# Patient Record
Sex: Female | Born: 1935 | Race: White | Hispanic: No | Smoking: Never smoker
Health system: Southern US, Community
[De-identification: ages and names within clinical notes are randomized; demographics above are authoritative.]

## PROBLEM LIST (undated history)

## (undated) DIAGNOSIS — K469 Unspecified abdominal hernia without obstruction or gangrene: Secondary | ICD-10-CM

## (undated) DIAGNOSIS — M1990 Unspecified osteoarthritis, unspecified site: Secondary | ICD-10-CM

## (undated) DIAGNOSIS — I10 Essential (primary) hypertension: Secondary | ICD-10-CM

## (undated) DIAGNOSIS — E669 Obesity, unspecified: Secondary | ICD-10-CM

## (undated) DIAGNOSIS — R42 Dizziness and giddiness: Secondary | ICD-10-CM

## (undated) HISTORY — PX: HERNIA REPAIR: SHX51

## (undated) HISTORY — PX: CHOLECYSTECTOMY: SHX55

## (undated) HISTORY — PX: TONSILLECTOMY: SUR1361

## (undated) HISTORY — DX: Obesity, unspecified: E66.9

## (undated) HISTORY — PX: OTHER SURGICAL HISTORY: SHX169

## (undated) HISTORY — DX: Dizziness and giddiness: R42

## (undated) HISTORY — PX: BREAST SURGERY: SHX581

---

## 2004-06-27 ENCOUNTER — Ambulatory Visit: Payer: Self-pay | Admitting: Cardiology

## 2005-01-22 ENCOUNTER — Encounter: Admission: RE | Admit: 2005-01-22 | Discharge: 2005-04-22 | Payer: Self-pay | Admitting: Family Medicine

## 2005-02-07 ENCOUNTER — Ambulatory Visit: Payer: Self-pay | Admitting: Cardiology

## 2005-02-08 ENCOUNTER — Ambulatory Visit: Payer: Self-pay | Admitting: Cardiology

## 2006-03-26 ENCOUNTER — Ambulatory Visit (HOSPITAL_COMMUNITY): Admission: RE | Admit: 2006-03-26 | Discharge: 2006-03-26 | Payer: Self-pay | Admitting: Endocrinology

## 2006-10-01 ENCOUNTER — Encounter: Admission: RE | Admit: 2006-10-01 | Discharge: 2006-10-01 | Payer: Self-pay | Admitting: Endocrinology

## 2006-10-01 ENCOUNTER — Encounter: Admission: RE | Admit: 2006-10-01 | Discharge: 2006-10-01 | Payer: Self-pay | Admitting: Family Medicine

## 2006-10-07 ENCOUNTER — Encounter (INDEPENDENT_AMBULATORY_CARE_PROVIDER_SITE_OTHER): Payer: Self-pay | Admitting: Diagnostic Radiology

## 2006-10-07 ENCOUNTER — Encounter: Admission: RE | Admit: 2006-10-07 | Discharge: 2006-10-07 | Payer: Self-pay | Admitting: Endocrinology

## 2006-10-07 ENCOUNTER — Other Ambulatory Visit: Admission: RE | Admit: 2006-10-07 | Discharge: 2006-10-07 | Payer: Self-pay | Admitting: Diagnostic Radiology

## 2007-06-09 ENCOUNTER — Encounter: Admission: RE | Admit: 2007-06-09 | Discharge: 2007-06-09 | Payer: Self-pay | Admitting: Endocrinology

## 2010-05-13 ENCOUNTER — Encounter: Payer: Self-pay | Admitting: Endocrinology

## 2011-06-08 DIAGNOSIS — R6889 Other general symptoms and signs: Secondary | ICD-10-CM | POA: Diagnosis not present

## 2011-06-09 DIAGNOSIS — I119 Hypertensive heart disease without heart failure: Secondary | ICD-10-CM | POA: Diagnosis not present

## 2011-06-10 DIAGNOSIS — E785 Hyperlipidemia, unspecified: Secondary | ICD-10-CM | POA: Diagnosis not present

## 2011-06-10 DIAGNOSIS — I10 Essential (primary) hypertension: Secondary | ICD-10-CM | POA: Diagnosis not present

## 2011-06-10 DIAGNOSIS — R5381 Other malaise: Secondary | ICD-10-CM | POA: Diagnosis not present

## 2011-10-28 ENCOUNTER — Ambulatory Visit (HOSPITAL_COMMUNITY)
Admission: RE | Admit: 2011-10-28 | Discharge: 2011-10-28 | Disposition: A | Discharge status: Home / Self Care | Payer: Medicare Other | Source: Ambulatory Visit | Attending: Family Medicine | Admitting: Family Medicine

## 2011-10-28 ENCOUNTER — Emergency Department (HOSPITAL_COMMUNITY)
Admission: EM | Admit: 2011-10-28 | Discharge: 2011-10-28 | Disposition: A | Discharge status: Home / Self Care | Payer: Medicare Other | Attending: Emergency Medicine | Admitting: Emergency Medicine

## 2011-10-28 ENCOUNTER — Other Ambulatory Visit: Payer: Self-pay | Admitting: Family Medicine

## 2011-10-28 ENCOUNTER — Encounter (HOSPITAL_COMMUNITY): Payer: Self-pay | Admitting: *Deleted

## 2011-10-28 DIAGNOSIS — Z9089 Acquired absence of other organs: Secondary | ICD-10-CM | POA: Insufficient documentation

## 2011-10-28 DIAGNOSIS — M129 Arthropathy, unspecified: Secondary | ICD-10-CM | POA: Insufficient documentation

## 2011-10-28 DIAGNOSIS — M79605 Pain in left leg: Secondary | ICD-10-CM

## 2011-10-28 DIAGNOSIS — I10 Essential (primary) hypertension: Secondary | ICD-10-CM | POA: Insufficient documentation

## 2011-10-28 DIAGNOSIS — M25569 Pain in unspecified knee: Secondary | ICD-10-CM | POA: Diagnosis not present

## 2011-10-28 DIAGNOSIS — M79609 Pain in unspecified limb: Secondary | ICD-10-CM | POA: Diagnosis not present

## 2011-10-28 HISTORY — DX: Essential (primary) hypertension: I10

## 2011-10-28 HISTORY — DX: Unspecified osteoarthritis, unspecified site: M19.90

## 2011-10-28 HISTORY — DX: Unspecified abdominal hernia without obstruction or gangrene: K46.9

## 2011-10-28 MED ORDER — HYDROCODONE-ACETAMINOPHEN 5-325 MG PO TABS: 1.0000 | ORAL_TABLET | Freq: Four times a day (QID) | ORAL | Status: AC | PRN

## 2011-10-28 NOTE — ED Provider Notes (Signed)
History  This chart was scribed for Shelda Jakes, MD by Erskine Emery. This patient was seen in room APA12/APA12 and the patient's care was started at 18:06.  CSN: [id number]  Arrival date & time 10/28/11  1708   First MD Initiated Contact with Patient 10/28/11 1806      Chief Complaint  Patient presents with  . Leg Pain    (Consider location/radiation/quality/duration/timing/severity/associated sxs/prior treatment) HPI  Megan Fisher is a 78 y.o. female who presents to the Emergency Department complaining of constant moderate pain in her left knee, with associated swelling that started two days ago. Pt reports a h/o arthritis but claims it has never been this bad. Pt states that she is unable to bear weight on her left knee due to the knee pain. Pt reports taking Ibuprofen with mild relief from symptoms. Pt reports no known injury to the knee and denies any fevers. Pt reports seeing Dr. Kathi Der office this afternoon at 3:15pm today and had an ultrasound that was negative. Pt reports that she does not react well to OxyContin or codeine but does not know about Vicodin. Pt reports a h/o incontinence.   Pt's PCP is Dr. Christell Constant, [facility].   Past Medical History  Diagnosis Date  . Hypertension   . Arthritis   . Hernia     Past Surgical History  Procedure Date  . Ooperectomy   . Tonsillectomy   . Hernia repair   . Cholecystectomy   . Breast surgery     History reviewed. No pertinent family history.  History  Substance Use Topics  . Smoking status: Never Smoker   . Smokeless tobacco: Not on file  . Alcohol Use: No    OB History    Grav Para Term Preterm Abortions TAB SAB Ect Mult Living                  Review of Systems  Constitutional: Negative for fever.  HENT: Negative for sore throat and neck pain.   Respiratory: Negative for cough.   Cardiovascular: Negative for chest pain.  Gastrointestinal: Negative for nausea, vomiting, abdominal pain and  diarrhea.  Genitourinary: Negative for dysuria.  Musculoskeletal: Positive for joint swelling. Negative for back pain.  All other systems reviewed and are negative.    Allergies  Aspirin; Citalopram; Oxycontin; and Penicillins  Home Medications   Current Outpatient Rx  Name Route Sig Dispense Refill  . HYDROCODONE-ACETAMINOPHEN 5-325 MG PO TABS Oral Take 1 tablet by mouth every 6 (six) hours as needed for pain. 10 tablet 0    Triage Vitals: BP 135/106  Pulse 80  Temp 98.7 F (37.1 C) (Oral)  Resp 22  Ht 5\' 5"  (1.651 m)  Wt 250 lb (113.399 kg)  BMI 41.60 kg/m2  SpO2 96%  Physical Exam  Nursing note and vitals reviewed. Constitutional: She is oriented to person, place, and time. She appears well-developed and well-nourished. No distress.  HENT:  Head: Normocephalic and atraumatic.  Eyes: EOM are normal. Pupils are equal, round, and reactive to light.  Neck: Normal range of motion. Neck supple. No tracheal deviation present.  Cardiovascular: Normal rate, regular rhythm and normal heart sounds.   No murmur heard. Pulmonary/Chest: Effort normal and breath sounds normal. No respiratory distress.       Lungs clear bilaterally.   Abdominal: Soft. Bowel sounds are normal. She exhibits no distension. There is no tenderness.  Musculoskeletal: Normal range of motion. She exhibits no edema.  Left Knee: Increase in warmth as compared to the right. No left knee effusion. Capillary refill is 1+ bilaterally on both toes.   Neurological: She is alert and oriented to person, place, and time. No cranial nerve deficit. Coordination normal.  Skin: Skin is warm and dry.  Psychiatric: She has a normal mood and affect. Her behavior is normal.    ED Course  Procedures (including critical care time)  DIAGNOSTIC STUDIES: Oxygen Saturation is 96% on room air, adequate by my interpretation.    COORDINATION OF CARE:  18:26--I discussed treatment plan including hydrocodone medication with  pt and pt agreed. I informed the pt that I believe her pain is associated to joint arthritis.  Labs Reviewed - No data to display US Venous Img Lower Unilateral Right  10/28/2011  *RADIOLOGY REPORT*  Clinical Data: Left leg pain  LEFT LOWER EXTREMITY VENOUS DUPLEX ULTRASOUND  Technique:  Gray-scale sonography with graded compression, as well as color Doppler and duplex ultrasound were performed to evaluate the deep venous system of the lower extremity from the level of the common femoral vein through the popliteal and proximal calf veins. Spectral Doppler was utilized to evaluate flow at rest and with distal augmentation maneuvers.  Comparison:  None.  Findings:  Normal compressibility of the common femoral, superficial femoral, and popliteal veins is demonstrated, as well as the visualized proximal calf veins.  No filling defects to suggest DVT on grayscale or color Doppler imaging.  Doppler waveforms show normal direction of venous flow, normal respiratory phasicity and response to augmentation.  IMPRESSION: No evidence of lower extremity deep vein thrombosis.  Original Report Authenticated By: Judie Petit. Ruel Favors, M.D.     1. Knee pain       MDM  No history of trauma. Patient referred by her primary care Dr. For Doppler study which is negative for Baker's cyst or thrombosis. On examination she is increased warmth along the joint line of the left knee suspect arthritis could be gout the patient does have a history of arthritis is most likely inflammatory Fridays from that. We'll treat with pain medicine and she started taking anti-inflammatories and followup with her regular doctor. I personally performed the services described in this documentation, which was scribed in my presence. The recorded information has been reviewed and considered.         Shelda Jakes, MD 10/28/11 [phone]

## 2011-10-28 NOTE — ED Notes (Signed)
Pain lt popliteal space for 3 days, no injury.  Had U/S done today.

## 2011-11-21 DIAGNOSIS — E785 Hyperlipidemia, unspecified: Secondary | ICD-10-CM | POA: Diagnosis not present

## 2011-11-21 DIAGNOSIS — R609 Edema, unspecified: Secondary | ICD-10-CM | POA: Diagnosis not present

## 2011-11-21 DIAGNOSIS — E559 Vitamin D deficiency, unspecified: Secondary | ICD-10-CM | POA: Diagnosis not present

## 2011-11-21 DIAGNOSIS — R5381 Other malaise: Secondary | ICD-10-CM | POA: Diagnosis not present

## 2011-11-21 DIAGNOSIS — E039 Hypothyroidism, unspecified: Secondary | ICD-10-CM | POA: Diagnosis not present

## 2011-11-28 DIAGNOSIS — R609 Edema, unspecified: Secondary | ICD-10-CM | POA: Diagnosis not present

## 2011-11-28 DIAGNOSIS — R5381 Other malaise: Secondary | ICD-10-CM | POA: Diagnosis not present

## 2011-11-28 DIAGNOSIS — R7989 Other specified abnormal findings of blood chemistry: Secondary | ICD-10-CM | POA: Diagnosis not present

## 2011-11-28 DIAGNOSIS — H6120 Impacted cerumen, unspecified ear: Secondary | ICD-10-CM | POA: Diagnosis not present

## 2011-12-05 DIAGNOSIS — I509 Heart failure, unspecified: Secondary | ICD-10-CM | POA: Diagnosis not present

## 2011-12-05 DIAGNOSIS — R32 Unspecified urinary incontinence: Secondary | ICD-10-CM | POA: Diagnosis not present

## 2011-12-05 DIAGNOSIS — R42 Dizziness and giddiness: Secondary | ICD-10-CM | POA: Diagnosis not present

## 2011-12-05 DIAGNOSIS — M25569 Pain in unspecified knee: Secondary | ICD-10-CM | POA: Diagnosis not present

## 2011-12-05 DIAGNOSIS — M109 Gout, unspecified: Secondary | ICD-10-CM | POA: Diagnosis not present

## 2011-12-05 DIAGNOSIS — R269 Unspecified abnormalities of gait and mobility: Secondary | ICD-10-CM | POA: Diagnosis not present

## 2011-12-06 DIAGNOSIS — R269 Unspecified abnormalities of gait and mobility: Secondary | ICD-10-CM | POA: Diagnosis not present

## 2011-12-06 DIAGNOSIS — R42 Dizziness and giddiness: Secondary | ICD-10-CM | POA: Diagnosis not present

## 2011-12-06 DIAGNOSIS — R32 Unspecified urinary incontinence: Secondary | ICD-10-CM | POA: Diagnosis not present

## 2011-12-06 DIAGNOSIS — I509 Heart failure, unspecified: Secondary | ICD-10-CM | POA: Diagnosis not present

## 2011-12-06 DIAGNOSIS — M109 Gout, unspecified: Secondary | ICD-10-CM | POA: Diagnosis not present

## 2011-12-06 DIAGNOSIS — M25569 Pain in unspecified knee: Secondary | ICD-10-CM | POA: Diagnosis not present

## 2011-12-09 DIAGNOSIS — R42 Dizziness and giddiness: Secondary | ICD-10-CM | POA: Diagnosis not present

## 2011-12-09 DIAGNOSIS — M25569 Pain in unspecified knee: Secondary | ICD-10-CM | POA: Diagnosis not present

## 2011-12-09 DIAGNOSIS — I509 Heart failure, unspecified: Secondary | ICD-10-CM | POA: Diagnosis not present

## 2011-12-09 DIAGNOSIS — R269 Unspecified abnormalities of gait and mobility: Secondary | ICD-10-CM | POA: Diagnosis not present

## 2011-12-09 DIAGNOSIS — M109 Gout, unspecified: Secondary | ICD-10-CM | POA: Diagnosis not present

## 2011-12-09 DIAGNOSIS — R32 Unspecified urinary incontinence: Secondary | ICD-10-CM | POA: Diagnosis not present

## 2011-12-10 DIAGNOSIS — M25569 Pain in unspecified knee: Secondary | ICD-10-CM | POA: Diagnosis not present

## 2011-12-10 DIAGNOSIS — R269 Unspecified abnormalities of gait and mobility: Secondary | ICD-10-CM | POA: Diagnosis not present

## 2011-12-10 DIAGNOSIS — M109 Gout, unspecified: Secondary | ICD-10-CM | POA: Diagnosis not present

## 2011-12-10 DIAGNOSIS — R32 Unspecified urinary incontinence: Secondary | ICD-10-CM | POA: Diagnosis not present

## 2011-12-10 DIAGNOSIS — R42 Dizziness and giddiness: Secondary | ICD-10-CM | POA: Diagnosis not present

## 2011-12-10 DIAGNOSIS — I509 Heart failure, unspecified: Secondary | ICD-10-CM | POA: Diagnosis not present

## 2011-12-11 DIAGNOSIS — R269 Unspecified abnormalities of gait and mobility: Secondary | ICD-10-CM | POA: Diagnosis not present

## 2011-12-11 DIAGNOSIS — R42 Dizziness and giddiness: Secondary | ICD-10-CM | POA: Diagnosis not present

## 2011-12-11 DIAGNOSIS — R32 Unspecified urinary incontinence: Secondary | ICD-10-CM | POA: Diagnosis not present

## 2011-12-11 DIAGNOSIS — I509 Heart failure, unspecified: Secondary | ICD-10-CM | POA: Diagnosis not present

## 2011-12-11 DIAGNOSIS — M25569 Pain in unspecified knee: Secondary | ICD-10-CM | POA: Diagnosis not present

## 2011-12-11 DIAGNOSIS — M109 Gout, unspecified: Secondary | ICD-10-CM | POA: Diagnosis not present

## 2011-12-12 DIAGNOSIS — R42 Dizziness and giddiness: Secondary | ICD-10-CM | POA: Diagnosis not present

## 2011-12-12 DIAGNOSIS — M25569 Pain in unspecified knee: Secondary | ICD-10-CM | POA: Diagnosis not present

## 2011-12-12 DIAGNOSIS — R32 Unspecified urinary incontinence: Secondary | ICD-10-CM | POA: Diagnosis not present

## 2011-12-12 DIAGNOSIS — M109 Gout, unspecified: Secondary | ICD-10-CM | POA: Diagnosis not present

## 2011-12-12 DIAGNOSIS — I509 Heart failure, unspecified: Secondary | ICD-10-CM | POA: Diagnosis not present

## 2011-12-12 DIAGNOSIS — R269 Unspecified abnormalities of gait and mobility: Secondary | ICD-10-CM | POA: Diagnosis not present

## 2011-12-16 DIAGNOSIS — I509 Heart failure, unspecified: Secondary | ICD-10-CM | POA: Diagnosis not present

## 2011-12-16 DIAGNOSIS — M25569 Pain in unspecified knee: Secondary | ICD-10-CM | POA: Diagnosis not present

## 2011-12-16 DIAGNOSIS — M109 Gout, unspecified: Secondary | ICD-10-CM | POA: Diagnosis not present

## 2011-12-16 DIAGNOSIS — R42 Dizziness and giddiness: Secondary | ICD-10-CM | POA: Diagnosis not present

## 2011-12-16 DIAGNOSIS — R269 Unspecified abnormalities of gait and mobility: Secondary | ICD-10-CM | POA: Diagnosis not present

## 2011-12-16 DIAGNOSIS — R32 Unspecified urinary incontinence: Secondary | ICD-10-CM | POA: Diagnosis not present

## 2011-12-19 DIAGNOSIS — M109 Gout, unspecified: Secondary | ICD-10-CM | POA: Diagnosis not present

## 2011-12-19 DIAGNOSIS — R32 Unspecified urinary incontinence: Secondary | ICD-10-CM | POA: Diagnosis not present

## 2011-12-19 DIAGNOSIS — R269 Unspecified abnormalities of gait and mobility: Secondary | ICD-10-CM | POA: Diagnosis not present

## 2011-12-19 DIAGNOSIS — M25569 Pain in unspecified knee: Secondary | ICD-10-CM | POA: Diagnosis not present

## 2011-12-19 DIAGNOSIS — R42 Dizziness and giddiness: Secondary | ICD-10-CM | POA: Diagnosis not present

## 2011-12-19 DIAGNOSIS — I509 Heart failure, unspecified: Secondary | ICD-10-CM | POA: Diagnosis not present

## 2011-12-21 DIAGNOSIS — R269 Unspecified abnormalities of gait and mobility: Secondary | ICD-10-CM | POA: Diagnosis not present

## 2011-12-21 DIAGNOSIS — R42 Dizziness and giddiness: Secondary | ICD-10-CM | POA: Diagnosis not present

## 2011-12-23 DIAGNOSIS — M109 Gout, unspecified: Secondary | ICD-10-CM | POA: Diagnosis not present

## 2011-12-23 DIAGNOSIS — R269 Unspecified abnormalities of gait and mobility: Secondary | ICD-10-CM | POA: Diagnosis not present

## 2011-12-23 DIAGNOSIS — I509 Heart failure, unspecified: Secondary | ICD-10-CM | POA: Diagnosis not present

## 2011-12-23 DIAGNOSIS — M25569 Pain in unspecified knee: Secondary | ICD-10-CM | POA: Diagnosis not present

## 2011-12-23 DIAGNOSIS — R32 Unspecified urinary incontinence: Secondary | ICD-10-CM | POA: Diagnosis not present

## 2011-12-23 DIAGNOSIS — R42 Dizziness and giddiness: Secondary | ICD-10-CM | POA: Diagnosis not present

## 2011-12-24 DIAGNOSIS — R32 Unspecified urinary incontinence: Secondary | ICD-10-CM | POA: Diagnosis not present

## 2011-12-24 DIAGNOSIS — R42 Dizziness and giddiness: Secondary | ICD-10-CM | POA: Diagnosis not present

## 2011-12-24 DIAGNOSIS — M25569 Pain in unspecified knee: Secondary | ICD-10-CM | POA: Diagnosis not present

## 2011-12-24 DIAGNOSIS — R269 Unspecified abnormalities of gait and mobility: Secondary | ICD-10-CM | POA: Diagnosis not present

## 2011-12-24 DIAGNOSIS — M109 Gout, unspecified: Secondary | ICD-10-CM | POA: Diagnosis not present

## 2011-12-24 DIAGNOSIS — I509 Heart failure, unspecified: Secondary | ICD-10-CM | POA: Diagnosis not present

## 2011-12-25 DIAGNOSIS — M109 Gout, unspecified: Secondary | ICD-10-CM | POA: Diagnosis not present

## 2011-12-25 DIAGNOSIS — M25569 Pain in unspecified knee: Secondary | ICD-10-CM | POA: Diagnosis not present

## 2011-12-25 DIAGNOSIS — R269 Unspecified abnormalities of gait and mobility: Secondary | ICD-10-CM | POA: Diagnosis not present

## 2011-12-25 DIAGNOSIS — R42 Dizziness and giddiness: Secondary | ICD-10-CM | POA: Diagnosis not present

## 2011-12-25 DIAGNOSIS — R32 Unspecified urinary incontinence: Secondary | ICD-10-CM | POA: Diagnosis not present

## 2011-12-25 DIAGNOSIS — I509 Heart failure, unspecified: Secondary | ICD-10-CM | POA: Diagnosis not present

## 2012-01-01 DIAGNOSIS — I509 Heart failure, unspecified: Secondary | ICD-10-CM | POA: Diagnosis not present

## 2012-01-01 DIAGNOSIS — R32 Unspecified urinary incontinence: Secondary | ICD-10-CM | POA: Diagnosis not present

## 2012-01-01 DIAGNOSIS — M109 Gout, unspecified: Secondary | ICD-10-CM | POA: Diagnosis not present

## 2012-01-01 DIAGNOSIS — R269 Unspecified abnormalities of gait and mobility: Secondary | ICD-10-CM | POA: Diagnosis not present

## 2012-01-01 DIAGNOSIS — M25569 Pain in unspecified knee: Secondary | ICD-10-CM | POA: Diagnosis not present

## 2012-01-01 DIAGNOSIS — R42 Dizziness and giddiness: Secondary | ICD-10-CM | POA: Diagnosis not present

## 2012-01-15 DIAGNOSIS — M25569 Pain in unspecified knee: Secondary | ICD-10-CM | POA: Diagnosis not present

## 2012-01-15 DIAGNOSIS — R42 Dizziness and giddiness: Secondary | ICD-10-CM | POA: Diagnosis not present

## 2012-01-15 DIAGNOSIS — M109 Gout, unspecified: Secondary | ICD-10-CM | POA: Diagnosis not present

## 2012-01-15 DIAGNOSIS — R269 Unspecified abnormalities of gait and mobility: Secondary | ICD-10-CM | POA: Diagnosis not present

## 2012-01-15 DIAGNOSIS — R32 Unspecified urinary incontinence: Secondary | ICD-10-CM | POA: Diagnosis not present

## 2012-01-15 DIAGNOSIS — I509 Heart failure, unspecified: Secondary | ICD-10-CM | POA: Diagnosis not present

## 2012-01-18 DIAGNOSIS — R269 Unspecified abnormalities of gait and mobility: Secondary | ICD-10-CM | POA: Diagnosis not present

## 2012-01-18 DIAGNOSIS — M109 Gout, unspecified: Secondary | ICD-10-CM | POA: Diagnosis not present

## 2012-01-18 DIAGNOSIS — M25569 Pain in unspecified knee: Secondary | ICD-10-CM | POA: Diagnosis not present

## 2012-01-18 DIAGNOSIS — R32 Unspecified urinary incontinence: Secondary | ICD-10-CM | POA: Diagnosis not present

## 2012-01-18 DIAGNOSIS — R42 Dizziness and giddiness: Secondary | ICD-10-CM | POA: Diagnosis not present

## 2012-01-18 DIAGNOSIS — I509 Heart failure, unspecified: Secondary | ICD-10-CM | POA: Diagnosis not present

## 2012-01-21 DIAGNOSIS — R42 Dizziness and giddiness: Secondary | ICD-10-CM | POA: Diagnosis not present

## 2012-01-21 DIAGNOSIS — R32 Unspecified urinary incontinence: Secondary | ICD-10-CM | POA: Diagnosis not present

## 2012-01-21 DIAGNOSIS — R269 Unspecified abnormalities of gait and mobility: Secondary | ICD-10-CM | POA: Diagnosis not present

## 2012-01-21 DIAGNOSIS — M109 Gout, unspecified: Secondary | ICD-10-CM | POA: Diagnosis not present

## 2012-01-21 DIAGNOSIS — I509 Heart failure, unspecified: Secondary | ICD-10-CM | POA: Diagnosis not present

## 2012-01-21 DIAGNOSIS — M25569 Pain in unspecified knee: Secondary | ICD-10-CM | POA: Diagnosis not present

## 2012-01-23 DIAGNOSIS — R269 Unspecified abnormalities of gait and mobility: Secondary | ICD-10-CM | POA: Diagnosis not present

## 2012-01-23 DIAGNOSIS — R32 Unspecified urinary incontinence: Secondary | ICD-10-CM | POA: Diagnosis not present

## 2012-01-23 DIAGNOSIS — I509 Heart failure, unspecified: Secondary | ICD-10-CM | POA: Diagnosis not present

## 2012-01-23 DIAGNOSIS — M109 Gout, unspecified: Secondary | ICD-10-CM | POA: Diagnosis not present

## 2012-01-23 DIAGNOSIS — R42 Dizziness and giddiness: Secondary | ICD-10-CM | POA: Diagnosis not present

## 2012-01-23 DIAGNOSIS — M25569 Pain in unspecified knee: Secondary | ICD-10-CM | POA: Diagnosis not present

## 2012-01-28 DIAGNOSIS — R32 Unspecified urinary incontinence: Secondary | ICD-10-CM | POA: Diagnosis not present

## 2012-01-28 DIAGNOSIS — I509 Heart failure, unspecified: Secondary | ICD-10-CM | POA: Diagnosis not present

## 2012-01-28 DIAGNOSIS — R42 Dizziness and giddiness: Secondary | ICD-10-CM | POA: Diagnosis not present

## 2012-01-28 DIAGNOSIS — R269 Unspecified abnormalities of gait and mobility: Secondary | ICD-10-CM | POA: Diagnosis not present

## 2012-01-28 DIAGNOSIS — M25569 Pain in unspecified knee: Secondary | ICD-10-CM | POA: Diagnosis not present

## 2012-01-28 DIAGNOSIS — M109 Gout, unspecified: Secondary | ICD-10-CM | POA: Diagnosis not present

## 2012-01-30 DIAGNOSIS — R32 Unspecified urinary incontinence: Secondary | ICD-10-CM | POA: Diagnosis not present

## 2012-01-30 DIAGNOSIS — I509 Heart failure, unspecified: Secondary | ICD-10-CM | POA: Diagnosis not present

## 2012-01-30 DIAGNOSIS — M109 Gout, unspecified: Secondary | ICD-10-CM | POA: Diagnosis not present

## 2012-01-30 DIAGNOSIS — M25569 Pain in unspecified knee: Secondary | ICD-10-CM | POA: Diagnosis not present

## 2012-01-30 DIAGNOSIS — R42 Dizziness and giddiness: Secondary | ICD-10-CM | POA: Diagnosis not present

## 2012-01-30 DIAGNOSIS — R269 Unspecified abnormalities of gait and mobility: Secondary | ICD-10-CM | POA: Diagnosis not present

## 2012-03-16 DIAGNOSIS — E785 Hyperlipidemia, unspecified: Secondary | ICD-10-CM | POA: Diagnosis not present

## 2012-03-16 DIAGNOSIS — L538 Other specified erythematous conditions: Secondary | ICD-10-CM | POA: Diagnosis not present

## 2012-03-16 DIAGNOSIS — R5381 Other malaise: Secondary | ICD-10-CM | POA: Diagnosis not present

## 2012-03-16 DIAGNOSIS — M109 Gout, unspecified: Secondary | ICD-10-CM | POA: Diagnosis not present

## 2012-03-16 DIAGNOSIS — R109 Unspecified abdominal pain: Secondary | ICD-10-CM | POA: Diagnosis not present

## 2012-05-11 DIAGNOSIS — E876 Hypokalemia: Secondary | ICD-10-CM | POA: Diagnosis not present

## 2012-05-11 DIAGNOSIS — R7989 Other specified abnormal findings of blood chemistry: Secondary | ICD-10-CM | POA: Diagnosis not present

## 2012-07-10 ENCOUNTER — Telehealth: Payer: Self-pay | Admitting: Physician Assistant

## 2012-07-10 NOTE — Telephone Encounter (Signed)
Sick on stomach blood high very dizzy

## 2012-07-10 NOTE — Telephone Encounter (Signed)
BP 158/86 NAUSEA DURING THE NIGHT FEELS FAINT AT TIMES  SPOKE TO DAUGHTER- SHE IS TO MONITOR BP Q 3HRS FOR THE REST OF TODAY- DOCUMENT IT AND CALL us IF ELEVATES ANY HIGHER. PT'S DAUGHTER AWARE OF A TERRIBLE GI BUG THAT'S GOING AROUND- IT COULD POSSIBLY  BE THE REASON FOR HER FEELING BAD.Marland Kitchen SHE WILL MON ITER ALL ISSUES AND IF NO BETTER COME IN FOR SAT AM APPT TOMORROW WITH DR Modesto Charon.

## 2012-07-13 ENCOUNTER — Telehealth: Payer: Self-pay | Admitting: Family Medicine

## 2012-07-13 NOTE — Telephone Encounter (Signed)
Daughter called and said her mother's Blood Pressure is high and she can't get her mother to Doctor's office.  Could we call something in for her

## 2012-07-13 NOTE — Telephone Encounter (Signed)
Daughter calls to say Mom has had blood pressures up to 155/100 and they have been going up to this on and off for a week or so.  Mom has inner ear problems and dizziness often but the elevated BP makes them think it could also be from that.  Could you call in Benicar meds to help with all this?  I advised an office visit may be necessary but due to health issues and the obesity of Megan Fisher,  Daughter doesn't want to bring her to office.  (Prefers [employer]).

## 2012-07-14 NOTE — Telephone Encounter (Signed)
rx printed at [facility]. A

## 2012-07-14 NOTE — Telephone Encounter (Signed)
I can not treat these symptoms without seeing the patient. Needs to be seen

## 2012-07-15 ENCOUNTER — Telehealth: Payer: Self-pay | Admitting: *Deleted

## 2012-07-15 NOTE — Telephone Encounter (Signed)
Pt aware needs to be seen for hypertension.

## 2012-08-03 DIAGNOSIS — N909 Noninflammatory disorder of vulva and perineum, unspecified: Secondary | ICD-10-CM | POA: Diagnosis not present

## 2012-08-03 DIAGNOSIS — N3941 Urge incontinence: Secondary | ICD-10-CM | POA: Diagnosis not present

## 2012-08-03 DIAGNOSIS — L280 Lichen simplex chronicus: Secondary | ICD-10-CM | POA: Diagnosis not present

## 2012-08-13 ENCOUNTER — Other Ambulatory Visit: Payer: Self-pay | Admitting: Physician Assistant

## 2012-09-21 ENCOUNTER — Telehealth: Payer: Self-pay | Admitting: Family Medicine

## 2012-09-21 ENCOUNTER — Telehealth: Payer: Self-pay | Admitting: Physician Assistant

## 2012-09-22 ENCOUNTER — Telehealth: Payer: Self-pay | Admitting: Family Medicine

## 2012-09-22 DIAGNOSIS — R5381 Other malaise: Secondary | ICD-10-CM | POA: Diagnosis not present

## 2012-09-22 DIAGNOSIS — H8110 Benign paroxysmal vertigo, unspecified ear: Secondary | ICD-10-CM | POA: Diagnosis not present

## 2012-09-22 NOTE — Telephone Encounter (Signed)
MESSAGE SENT TO [facility]

## 2012-09-24 ENCOUNTER — Other Ambulatory Visit: Payer: Self-pay | Admitting: Family Medicine

## 2012-09-25 ENCOUNTER — Telehealth: Payer: Self-pay | Admitting: Family Medicine

## 2012-09-25 NOTE — Telephone Encounter (Signed)
Patient called office stating that she had been trying to get her antivert refilled since Monday. Has called several times. Advised that she went to Urgent Care and was treated and for an ear ache. Just needs a refill on the antivert. Called to [facility] pharmacy and patient notified.

## 2012-09-25 NOTE — Telephone Encounter (Signed)
Angelica Chessman spoke with patient and needed refill and mandy took care of

## 2012-10-26 ENCOUNTER — Other Ambulatory Visit: Payer: Self-pay | Admitting: Family Medicine

## 2012-10-28 NOTE — Telephone Encounter (Signed)
LAST OV 06/23/12. LASIX NOT IN EPIC BUT IN PAPER CHART. PLEASE REVIEW. THANKS.

## 2012-11-23 ENCOUNTER — Other Ambulatory Visit: Payer: Self-pay | Admitting: Nurse Practitioner

## 2012-12-15 ENCOUNTER — Ambulatory Visit (INDEPENDENT_AMBULATORY_CARE_PROVIDER_SITE_OTHER): Payer: Medicare Other | Admitting: Family Medicine

## 2012-12-15 ENCOUNTER — Ambulatory Visit (INDEPENDENT_AMBULATORY_CARE_PROVIDER_SITE_OTHER): Payer: Medicare Other

## 2012-12-15 ENCOUNTER — Encounter: Payer: Self-pay | Admitting: Family Medicine

## 2012-12-15 VITALS — BP 131/75 | HR 82 | Temp 98.7°F

## 2012-12-15 DIAGNOSIS — R0602 Shortness of breath: Secondary | ICD-10-CM

## 2012-12-15 DIAGNOSIS — F411 Generalized anxiety disorder: Secondary | ICD-10-CM

## 2012-12-15 DIAGNOSIS — R609 Edema, unspecified: Secondary | ICD-10-CM | POA: Diagnosis not present

## 2012-12-15 DIAGNOSIS — K219 Gastro-esophageal reflux disease without esophagitis: Secondary | ICD-10-CM

## 2012-12-15 DIAGNOSIS — R42 Dizziness and giddiness: Secondary | ICD-10-CM | POA: Diagnosis not present

## 2012-12-15 LAB — POCT CBC
Granulocyte percent: 60.1 %G (ref 37–80)
HCT, POC: 44.1 % (ref 37.7–47.9)
Hemoglobin: 14.9 g/dL (ref 12.2–16.2)
Lymph, poc: 2.8 (ref 0.6–3.4)
MCH, POC: 30 pg (ref 27–31.2)
MCHC: 33.7 g/dL (ref 31.8–35.4)
MCV: 88.8 fL (ref 80–97)
MPV: 7.4 fL (ref 0–99.8)
POC Granulocyte: 4.4 (ref 2–6.9)
POC LYMPH PERCENT: 37.9 %L (ref 10–50)
Platelet Count, POC: 221 10*3/uL (ref 142–424)
RBC: 5 M/uL (ref 4.04–5.48)
RDW, POC: 12.3 %
WBC: 7.4 10*3/uL (ref 4.6–10.2)

## 2012-12-15 MED ORDER — PAROXETINE HCL 20 MG PO TABS: 20.0000 mg | ORAL_TABLET | ORAL | Status: DC

## 2012-12-15 MED ORDER — METOLAZONE 2.5 MG PO TABS: ORAL_TABLET | ORAL | Status: DC

## 2012-12-15 MED ORDER — OMEPRAZOLE 20 MG PO CPDR: 20.0000 mg | DELAYED_RELEASE_CAPSULE | Freq: Every day | ORAL | Status: DC

## 2012-12-15 MED ORDER — FUROSEMIDE 40 MG PO TABS: 40.0000 mg | ORAL_TABLET | Freq: Every day | ORAL | Status: DC

## 2012-12-15 MED ORDER — MECLIZINE HCL 25 MG PO TABS: 25.0000 mg | ORAL_TABLET | Freq: Three times a day (TID) | ORAL | Status: DC | PRN

## 2012-12-15 NOTE — Patient Instructions (Signed)
Edema  Edema is an abnormal build-up of fluids in tissues. Because this is partly dependent on gravity (water flows to the lowest place), it is more common in the legs and thighs (lower extremities). It is also common in the looser tissues, like around the eyes. Painless swelling of the feet and ankles is common and increases as a person ages. It may affect both legs and may include the calves or even thighs. When squeezed, the fluid may move out of the affected area and may leave a dent for a few moments.  CAUSES    Prolonged standing or sitting in one place for extended periods of time. Movement helps pump tissue fluid into the veins, and absence of movement prevents this, resulting in edema.   Varicose veins. The valves in the veins do not work as well as they should. This causes fluid to leak into the tissues.   Fluid and salt overload.   Injury, burn, or surgery to the leg, ankle, or foot, may damage veins and allow fluid to leak out.   Sunburn damages vessels. Leaky vessels allow fluid to go out into the sunburned tissues.   Allergies (from insect bites or stings, medications or chemicals) cause swelling by allowing vessels to become leaky.   Protein in the blood helps keep fluid in your vessels. Low protein, as in malnutrition, allows fluid to leak out.   Hormonal changes, including pregnancy and menstruation, cause fluid retention. This fluid may leak out of vessels and cause edema.   Medications that cause fluid retention. Examples are sex hormones, blood pressure medications, steroid treatment, or anti-depressants.   Some illnesses cause edema, especially heart failure, kidney disease, or liver disease.   Surgery that cuts veins or lymph nodes, such as surgery done for the heart or for breast cancer, may result in edema.  DIAGNOSIS   Your caregiver is usually easily able to determine what is causing your swelling (edema) by simply asking what is wrong (getting a history) and examining you (doing  a physical). Sometimes x-rays, EKG (electrocardiogram or heart tracing), and blood work may be done to evaluate for underlying medical illness.  TREATMENT   General treatment includes:   Leg elevation (or elevation of the affected body part).   Restriction of fluid intake.   Prevention of fluid overload.   Compression of the affected body part. Compression with elastic bandages or support stockings squeezes the tissues, preventing fluid from entering and forcing it back into the blood vessels.   Diuretics (also called water pills or fluid pills) pull fluid out of your body in the form of increased urination. These are effective in reducing the swelling, but can have side effects and must be used only under your caregiver's supervision. Diuretics are appropriate only for some types of edema.  The specific treatment can be directed at any underlying causes discovered. Heart, liver, or kidney disease should be treated appropriately.  HOME CARE INSTRUCTIONS    Elevate the legs (or affected body part) above the level of the heart, while lying down.   Avoid sitting or standing still for prolonged periods of time.   Avoid putting anything directly under the knees when lying down, and do not wear constricting clothing or garters on the upper legs.   Exercising the legs causes the fluid to work back into the veins and lymphatic channels. This may help the swelling go down.   The pressure applied by elastic bandages or support stockings can help reduce ankle swelling.     A low-salt diet may help reduce fluid retention and decrease the ankle swelling.   Take any medications exactly as prescribed.  SEEK MEDICAL CARE IF:   Your edema is not responding to recommended treatments.  SEEK IMMEDIATE MEDICAL CARE IF:    You develop shortness of breath or chest pain.   You cannot breathe when you lay down; or if, while lying down, you have to get up and go to the window to get your breath.   You are having increasing  swelling without relief from treatment.   You develop a fever over 102 F (38.9 C).   You develop pain or redness in the areas that are swollen.   Tell your caregiver right away if you have gained 3 lb/1.4 kg in 1 day or 5 lb/2.3 kg in a week.  MAKE SURE YOU:    Understand these instructions.   Will watch your condition.   Will get help right away if you are not doing well or get worse.  Document Released: 04/08/2005 Document Revised: 10/08/2011 Document Reviewed: 11/25/2007  ExitCare Patient Information 2014 [employer].

## 2012-12-15 NOTE — Progress Notes (Signed)
  Subjective:    Patient ID: Megan Fisher, female    DOB: Feb 13, 1929, 79 y.o.   MRN: [id number]  HPI This 79 y.o. female presents for evaluation of multiple medical complaints.  She is c/o edema In her lower extremities and states she has been having some shortness of breath.  Her daughter Accompanies her and states she has been SOB when getting into and out the wheelchair.  She Has arthritis and gait problems so she is in a wheelchair.  She has been having to take lasix 40mg  po Qd for her edema and she states she has hx of fluid around her heart and lungs.  She has hx of anxiety, BPV, GERD, goiter,obesity, and arthritis.   Review of Systems C/o edema, arthritis, shortness of breath, and GERD sx's. No chest pain, HA, vision change, N/V, diarrhea, constipation, dysuria, urinary urgency or frequency, or rash.     Objective:   Physical Exam Vital signs noted  Chronic ill appearing obese female in wheelchair.  HEENT - Head atraumatic Normocephalic                Eyes - PERRLA, Conjuctiva - clear Sclera- Clear EOMI                Ears - EAC's Wnl TM's Wnl Gross Hearing WNL                Nose - Nares patent                 Throat - oropharanx wnl Respiratory - Lungs CTA bilateral Cardiac - RRR S1 and S2 without murmur GI - Abdomen soft Nontender and bowel sounds active x 4 Extremities - Bilateral pretibial and pedal edema 2 plus and pitting Neuro - Grossly intact.       Assessment & Plan:  Edema - Plan: POCT CBC, CMP14+EGFR, Brain natriuretic peptide, metolazone (ZAROXOLYN) 2.5 MG tablet, furosemide (LASIX) 40 MG tablet.  Explained to take zaroxolyn 30 minutes prior to lasix.  Discussed using compression stockings and patient refuses.  GERD (gastroesophageal reflux disease) - Plan: omeprazole (PRILOSEC) 20 MG capsule  Anxiety state, unspecified - Plan: PARoxetine (PAXIL) 20 MG tablet  Vertigo - Plan: meclizine (ANTIVERT) 25 MG tablet po tid prn.  Shortness of breath - Plan:  Brain natriuretic peptide, DG Chest 2 Views  Follow up in 2 weeks or prn.

## 2012-12-16 ENCOUNTER — Other Ambulatory Visit: Payer: Self-pay | Admitting: Family Medicine

## 2012-12-16 DIAGNOSIS — E876 Hypokalemia: Secondary | ICD-10-CM

## 2012-12-16 LAB — CMP14+EGFR
ALT: 18 IU/L (ref 0–32)
AST: 19 IU/L (ref 0–40)
Albumin/Globulin Ratio: 1.5 (ref 1.1–2.5)
Albumin: 4.1 g/dL (ref 3.5–4.7)
Alkaline Phosphatase: 67 IU/L (ref 39–117)
BUN/Creatinine Ratio: 13 (ref 11–26)
BUN: 10 mg/dL (ref 8–27)
CO2: 30 mmol/L — ABNORMAL HIGH (ref 18–29)
Calcium: 9.7 mg/dL (ref 8.6–10.2)
Chloride: 96 mmol/L — ABNORMAL LOW (ref 97–108)
Creatinine, Ser: 0.79 mg/dL (ref 0.57–1.00)
GFR calc Af Amer: 81 mL/min/{1.73_m2} (ref >59)
GFR calc non Af Amer: 70 mL/min/{1.73_m2} (ref >59)
Globulin, Total: 2.8 g/dL (ref 1.5–4.5)
Glucose: 102 mg/dL — ABNORMAL HIGH (ref 65–99)
Potassium: 3.3 mmol/L — ABNORMAL LOW (ref 3.5–5.2)
Sodium: 141 mmol/L (ref 134–144)
Total Bilirubin: 0.5 mg/dL (ref 0.0–1.2)
Total Protein: 6.9 g/dL (ref 6.0–8.5)

## 2012-12-16 LAB — BRAIN NATRIURETIC PEPTIDE: BNP: 34.4 pg/mL (ref 0.0–100.0)

## 2012-12-16 MED ORDER — POTASSIUM CHLORIDE CRYS ER 20 MEQ PO TBCR: 20.0000 meq | EXTENDED_RELEASE_TABLET | Freq: Every day | ORAL | Status: DC

## 2012-12-17 ENCOUNTER — Telehealth: Payer: Self-pay | Admitting: Family Medicine

## 2012-12-17 NOTE — Telephone Encounter (Signed)
Pt notified of lab results

## 2012-12-20 DIAGNOSIS — Z91040 Latex allergy status: Secondary | ICD-10-CM | POA: Diagnosis not present

## 2012-12-20 DIAGNOSIS — R1312 Dysphagia, oropharyngeal phase: Secondary | ICD-10-CM | POA: Diagnosis not present

## 2012-12-20 DIAGNOSIS — F411 Generalized anxiety disorder: Secondary | ICD-10-CM | POA: Diagnosis present

## 2012-12-20 DIAGNOSIS — R1314 Dysphagia, pharyngoesophageal phase: Secondary | ICD-10-CM | POA: Diagnosis not present

## 2012-12-20 DIAGNOSIS — Z79899 Other long term (current) drug therapy: Secondary | ICD-10-CM | POA: Diagnosis not present

## 2012-12-20 DIAGNOSIS — M6281 Muscle weakness (generalized): Secondary | ICD-10-CM | POA: Diagnosis not present

## 2012-12-20 DIAGNOSIS — A498 Other bacterial infections of unspecified site: Secondary | ICD-10-CM | POA: Diagnosis not present

## 2012-12-20 DIAGNOSIS — I10 Essential (primary) hypertension: Secondary | ICD-10-CM | POA: Diagnosis not present

## 2012-12-20 DIAGNOSIS — R279 Unspecified lack of coordination: Secondary | ICD-10-CM | POA: Diagnosis not present

## 2012-12-20 DIAGNOSIS — Z883 Allergy status to other anti-infective agents status: Secondary | ICD-10-CM | POA: Diagnosis not present

## 2012-12-20 DIAGNOSIS — Z8505 Personal history of malignant neoplasm of liver: Secondary | ICD-10-CM | POA: Diagnosis not present

## 2012-12-20 DIAGNOSIS — R41841 Cognitive communication deficit: Secondary | ICD-10-CM | POA: Diagnosis not present

## 2012-12-20 DIAGNOSIS — R110 Nausea: Secondary | ICD-10-CM | POA: Diagnosis not present

## 2012-12-20 DIAGNOSIS — Z5189 Encounter for other specified aftercare: Secondary | ICD-10-CM | POA: Diagnosis not present

## 2012-12-20 DIAGNOSIS — R269 Unspecified abnormalities of gait and mobility: Secondary | ICD-10-CM | POA: Diagnosis not present

## 2012-12-20 DIAGNOSIS — Z6841 Body Mass Index (BMI) 40.0 and over, adult: Secondary | ICD-10-CM | POA: Diagnosis not present

## 2012-12-20 DIAGNOSIS — N12 Tubulo-interstitial nephritis, not specified as acute or chronic: Secondary | ICD-10-CM | POA: Diagnosis not present

## 2012-12-20 DIAGNOSIS — E871 Hypo-osmolality and hyponatremia: Secondary | ICD-10-CM | POA: Diagnosis not present

## 2012-12-20 DIAGNOSIS — Z880 Allergy status to penicillin: Secondary | ICD-10-CM | POA: Diagnosis not present

## 2012-12-20 DIAGNOSIS — F329 Major depressive disorder, single episode, unspecified: Secondary | ICD-10-CM | POA: Diagnosis not present

## 2012-12-20 DIAGNOSIS — R9431 Abnormal electrocardiogram [ECG] [EKG]: Secondary | ICD-10-CM | POA: Diagnosis not present

## 2012-12-20 DIAGNOSIS — Z886 Allergy status to analgesic agent status: Secondary | ICD-10-CM | POA: Diagnosis not present

## 2012-12-20 DIAGNOSIS — R6889 Other general symptoms and signs: Secondary | ICD-10-CM | POA: Diagnosis present

## 2012-12-20 DIAGNOSIS — E876 Hypokalemia: Secondary | ICD-10-CM | POA: Diagnosis not present

## 2012-12-20 DIAGNOSIS — R5381 Other malaise: Secondary | ICD-10-CM | POA: Diagnosis not present

## 2012-12-20 DIAGNOSIS — M1990 Unspecified osteoarthritis, unspecified site: Secondary | ICD-10-CM | POA: Diagnosis present

## 2012-12-20 DIAGNOSIS — R404 Transient alteration of awareness: Secondary | ICD-10-CM | POA: Diagnosis not present

## 2012-12-24 DIAGNOSIS — M1990 Unspecified osteoarthritis, unspecified site: Secondary | ICD-10-CM | POA: Diagnosis not present

## 2012-12-24 DIAGNOSIS — E876 Hypokalemia: Secondary | ICD-10-CM | POA: Diagnosis not present

## 2012-12-24 DIAGNOSIS — M159 Polyosteoarthritis, unspecified: Secondary | ICD-10-CM | POA: Diagnosis not present

## 2012-12-24 DIAGNOSIS — Z6841 Body Mass Index (BMI) 40.0 and over, adult: Secondary | ICD-10-CM | POA: Diagnosis not present

## 2012-12-24 DIAGNOSIS — R5381 Other malaise: Secondary | ICD-10-CM | POA: Diagnosis not present

## 2012-12-24 DIAGNOSIS — F411 Generalized anxiety disorder: Secondary | ICD-10-CM | POA: Diagnosis not present

## 2012-12-24 DIAGNOSIS — N3090 Cystitis, unspecified without hematuria: Secondary | ICD-10-CM | POA: Diagnosis not present

## 2012-12-24 DIAGNOSIS — R41841 Cognitive communication deficit: Secondary | ICD-10-CM | POA: Diagnosis not present

## 2012-12-24 DIAGNOSIS — R1312 Dysphagia, oropharyngeal phase: Secondary | ICD-10-CM | POA: Diagnosis not present

## 2012-12-24 DIAGNOSIS — R1314 Dysphagia, pharyngoesophageal phase: Secondary | ICD-10-CM | POA: Diagnosis not present

## 2012-12-24 DIAGNOSIS — R197 Diarrhea, unspecified: Secondary | ICD-10-CM | POA: Diagnosis not present

## 2012-12-24 DIAGNOSIS — R32 Unspecified urinary incontinence: Secondary | ICD-10-CM | POA: Diagnosis not present

## 2012-12-24 DIAGNOSIS — E871 Hypo-osmolality and hyponatremia: Secondary | ICD-10-CM | POA: Diagnosis not present

## 2012-12-24 DIAGNOSIS — M6281 Muscle weakness (generalized): Secondary | ICD-10-CM | POA: Diagnosis not present

## 2012-12-24 DIAGNOSIS — A498 Other bacterial infections of unspecified site: Secondary | ICD-10-CM | POA: Diagnosis not present

## 2012-12-24 DIAGNOSIS — Z79899 Other long term (current) drug therapy: Secondary | ICD-10-CM | POA: Diagnosis not present

## 2012-12-24 DIAGNOSIS — Z5189 Encounter for other specified aftercare: Secondary | ICD-10-CM | POA: Diagnosis not present

## 2012-12-24 DIAGNOSIS — R279 Unspecified lack of coordination: Secondary | ICD-10-CM | POA: Diagnosis not present

## 2012-12-24 DIAGNOSIS — F329 Major depressive disorder, single episode, unspecified: Secondary | ICD-10-CM | POA: Diagnosis not present

## 2012-12-24 DIAGNOSIS — R6889 Other general symptoms and signs: Secondary | ICD-10-CM | POA: Diagnosis not present

## 2012-12-24 DIAGNOSIS — N12 Tubulo-interstitial nephritis, not specified as acute or chronic: Secondary | ICD-10-CM | POA: Diagnosis not present

## 2012-12-24 DIAGNOSIS — R269 Unspecified abnormalities of gait and mobility: Secondary | ICD-10-CM | POA: Diagnosis not present

## 2012-12-24 DIAGNOSIS — I10 Essential (primary) hypertension: Secondary | ICD-10-CM | POA: Diagnosis not present

## 2013-01-04 DIAGNOSIS — R5381 Other malaise: Secondary | ICD-10-CM | POA: Diagnosis not present

## 2013-01-04 DIAGNOSIS — E876 Hypokalemia: Secondary | ICD-10-CM | POA: Diagnosis not present

## 2013-01-04 DIAGNOSIS — R32 Unspecified urinary incontinence: Secondary | ICD-10-CM | POA: Diagnosis not present

## 2013-02-03 DIAGNOSIS — M159 Polyosteoarthritis, unspecified: Secondary | ICD-10-CM | POA: Diagnosis not present

## 2013-02-03 DIAGNOSIS — N3090 Cystitis, unspecified without hematuria: Secondary | ICD-10-CM | POA: Diagnosis not present

## 2013-03-14 DIAGNOSIS — F411 Generalized anxiety disorder: Secondary | ICD-10-CM | POA: Diagnosis not present

## 2013-03-14 DIAGNOSIS — I10 Essential (primary) hypertension: Secondary | ICD-10-CM | POA: Diagnosis not present

## 2013-03-14 DIAGNOSIS — Z87440 Personal history of urinary (tract) infections: Secondary | ICD-10-CM | POA: Diagnosis not present

## 2013-03-14 DIAGNOSIS — M159 Polyosteoarthritis, unspecified: Secondary | ICD-10-CM | POA: Diagnosis not present

## 2013-03-17 DIAGNOSIS — Z87440 Personal history of urinary (tract) infections: Secondary | ICD-10-CM | POA: Diagnosis not present

## 2013-03-17 DIAGNOSIS — F411 Generalized anxiety disorder: Secondary | ICD-10-CM | POA: Diagnosis not present

## 2013-03-17 DIAGNOSIS — I10 Essential (primary) hypertension: Secondary | ICD-10-CM | POA: Diagnosis not present

## 2013-03-17 DIAGNOSIS — M159 Polyosteoarthritis, unspecified: Secondary | ICD-10-CM | POA: Diagnosis not present

## 2013-03-19 DIAGNOSIS — E079 Disorder of thyroid, unspecified: Secondary | ICD-10-CM | POA: Diagnosis not present

## 2013-03-19 DIAGNOSIS — I10 Essential (primary) hypertension: Secondary | ICD-10-CM | POA: Diagnosis not present

## 2013-03-22 DIAGNOSIS — Z87440 Personal history of urinary (tract) infections: Secondary | ICD-10-CM | POA: Diagnosis not present

## 2013-03-22 DIAGNOSIS — F411 Generalized anxiety disorder: Secondary | ICD-10-CM | POA: Diagnosis not present

## 2013-03-22 DIAGNOSIS — I10 Essential (primary) hypertension: Secondary | ICD-10-CM | POA: Diagnosis not present

## 2013-03-22 DIAGNOSIS — M159 Polyosteoarthritis, unspecified: Secondary | ICD-10-CM | POA: Diagnosis not present

## 2013-03-23 DIAGNOSIS — M159 Polyosteoarthritis, unspecified: Secondary | ICD-10-CM | POA: Diagnosis not present

## 2013-03-23 DIAGNOSIS — Z87440 Personal history of urinary (tract) infections: Secondary | ICD-10-CM | POA: Diagnosis not present

## 2013-03-23 DIAGNOSIS — F411 Generalized anxiety disorder: Secondary | ICD-10-CM | POA: Diagnosis not present

## 2013-03-23 DIAGNOSIS — I10 Essential (primary) hypertension: Secondary | ICD-10-CM | POA: Diagnosis not present

## 2013-03-24 ENCOUNTER — Other Ambulatory Visit: Payer: Self-pay | Admitting: Family Medicine

## 2013-03-25 DIAGNOSIS — Z87440 Personal history of urinary (tract) infections: Secondary | ICD-10-CM | POA: Diagnosis not present

## 2013-03-25 DIAGNOSIS — M159 Polyosteoarthritis, unspecified: Secondary | ICD-10-CM | POA: Diagnosis not present

## 2013-03-25 DIAGNOSIS — F411 Generalized anxiety disorder: Secondary | ICD-10-CM | POA: Diagnosis not present

## 2013-03-25 DIAGNOSIS — I10 Essential (primary) hypertension: Secondary | ICD-10-CM | POA: Diagnosis not present

## 2013-03-29 DIAGNOSIS — Z87440 Personal history of urinary (tract) infections: Secondary | ICD-10-CM | POA: Diagnosis not present

## 2013-03-29 DIAGNOSIS — M159 Polyosteoarthritis, unspecified: Secondary | ICD-10-CM | POA: Diagnosis not present

## 2013-03-29 DIAGNOSIS — F411 Generalized anxiety disorder: Secondary | ICD-10-CM | POA: Diagnosis not present

## 2013-03-29 DIAGNOSIS — I10 Essential (primary) hypertension: Secondary | ICD-10-CM | POA: Diagnosis not present

## 2013-03-30 DIAGNOSIS — M159 Polyosteoarthritis, unspecified: Secondary | ICD-10-CM | POA: Diagnosis not present

## 2013-03-30 DIAGNOSIS — Z87440 Personal history of urinary (tract) infections: Secondary | ICD-10-CM | POA: Diagnosis not present

## 2013-03-30 DIAGNOSIS — F411 Generalized anxiety disorder: Secondary | ICD-10-CM | POA: Diagnosis not present

## 2013-03-30 DIAGNOSIS — I10 Essential (primary) hypertension: Secondary | ICD-10-CM | POA: Diagnosis not present

## 2013-03-31 DIAGNOSIS — M159 Polyosteoarthritis, unspecified: Secondary | ICD-10-CM | POA: Diagnosis not present

## 2013-03-31 DIAGNOSIS — Z87440 Personal history of urinary (tract) infections: Secondary | ICD-10-CM | POA: Diagnosis not present

## 2013-03-31 DIAGNOSIS — F411 Generalized anxiety disorder: Secondary | ICD-10-CM | POA: Diagnosis not present

## 2013-03-31 DIAGNOSIS — I10 Essential (primary) hypertension: Secondary | ICD-10-CM | POA: Diagnosis not present

## 2013-04-01 DIAGNOSIS — I10 Essential (primary) hypertension: Secondary | ICD-10-CM | POA: Diagnosis not present

## 2013-04-01 DIAGNOSIS — F411 Generalized anxiety disorder: Secondary | ICD-10-CM | POA: Diagnosis not present

## 2013-04-01 DIAGNOSIS — Z87440 Personal history of urinary (tract) infections: Secondary | ICD-10-CM | POA: Diagnosis not present

## 2013-04-01 DIAGNOSIS — M159 Polyosteoarthritis, unspecified: Secondary | ICD-10-CM | POA: Diagnosis not present

## 2013-04-05 DIAGNOSIS — F411 Generalized anxiety disorder: Secondary | ICD-10-CM | POA: Diagnosis not present

## 2013-04-05 DIAGNOSIS — I10 Essential (primary) hypertension: Secondary | ICD-10-CM | POA: Diagnosis not present

## 2013-04-05 DIAGNOSIS — R3915 Urgency of urination: Secondary | ICD-10-CM | POA: Diagnosis not present

## 2013-04-05 DIAGNOSIS — Z87440 Personal history of urinary (tract) infections: Secondary | ICD-10-CM | POA: Diagnosis not present

## 2013-04-05 DIAGNOSIS — M159 Polyosteoarthritis, unspecified: Secondary | ICD-10-CM | POA: Diagnosis not present

## 2013-04-05 DIAGNOSIS — E119 Type 2 diabetes mellitus without complications: Secondary | ICD-10-CM | POA: Diagnosis not present

## 2013-04-06 DIAGNOSIS — I10 Essential (primary) hypertension: Secondary | ICD-10-CM | POA: Diagnosis not present

## 2013-04-06 DIAGNOSIS — M159 Polyosteoarthritis, unspecified: Secondary | ICD-10-CM | POA: Diagnosis not present

## 2013-04-06 DIAGNOSIS — Z87440 Personal history of urinary (tract) infections: Secondary | ICD-10-CM | POA: Diagnosis not present

## 2013-04-06 DIAGNOSIS — F411 Generalized anxiety disorder: Secondary | ICD-10-CM | POA: Diagnosis not present

## 2013-04-08 DIAGNOSIS — Z87440 Personal history of urinary (tract) infections: Secondary | ICD-10-CM | POA: Diagnosis not present

## 2013-04-08 DIAGNOSIS — M159 Polyosteoarthritis, unspecified: Secondary | ICD-10-CM | POA: Diagnosis not present

## 2013-04-08 DIAGNOSIS — I10 Essential (primary) hypertension: Secondary | ICD-10-CM | POA: Diagnosis not present

## 2013-04-08 DIAGNOSIS — F411 Generalized anxiety disorder: Secondary | ICD-10-CM | POA: Diagnosis not present

## 2013-04-13 ENCOUNTER — Telehealth: Payer: Self-pay | Admitting: Family Medicine

## 2013-04-14 DIAGNOSIS — I10 Essential (primary) hypertension: Secondary | ICD-10-CM | POA: Diagnosis not present

## 2013-04-14 DIAGNOSIS — F411 Generalized anxiety disorder: Secondary | ICD-10-CM | POA: Diagnosis not present

## 2013-04-14 DIAGNOSIS — Z87440 Personal history of urinary (tract) infections: Secondary | ICD-10-CM | POA: Diagnosis not present

## 2013-04-14 DIAGNOSIS — M159 Polyosteoarthritis, unspecified: Secondary | ICD-10-CM | POA: Diagnosis not present

## 2013-04-14 NOTE — Telephone Encounter (Signed)
Pt states that Tonya the nurse from [facility] brought in a urine and blood last week and she has not heard anything. I will check with the lab manager April and see if she can find out what is going on.

## 2013-04-14 NOTE — Telephone Encounter (Signed)
Talked to [place] with [facility]. She will call patient later this morning. She is positive that patient is already aware of results that she does not remember. Nyx notified that Archie Patten would call her this afternoon.

## 2013-04-16 DIAGNOSIS — Z87440 Personal history of urinary (tract) infections: Secondary | ICD-10-CM | POA: Diagnosis not present

## 2013-04-16 DIAGNOSIS — I10 Essential (primary) hypertension: Secondary | ICD-10-CM | POA: Diagnosis not present

## 2013-04-16 DIAGNOSIS — F411 Generalized anxiety disorder: Secondary | ICD-10-CM | POA: Diagnosis not present

## 2013-04-16 DIAGNOSIS — M159 Polyosteoarthritis, unspecified: Secondary | ICD-10-CM | POA: Diagnosis not present

## 2013-04-19 NOTE — Telephone Encounter (Signed)
Spoke with pt and she stated [place] never called her with lab results.No results have been scanned in her chart. Spoke with Noreene Larsson and she stated she spoke with [place] on 12-24 and per [place] she called pt.  Advised pt to call [facility] for return call from [facility]

## 2013-04-20 ENCOUNTER — Telehealth: Payer: Self-pay | Admitting: Family Medicine

## 2013-04-20 NOTE — Telephone Encounter (Signed)
Called [facility] and waiting for them to call me back

## 2013-04-23 ENCOUNTER — Other Ambulatory Visit: Payer: Self-pay | Admitting: Family Medicine

## 2013-04-23 DIAGNOSIS — N390 Urinary tract infection, site not specified: Secondary | ICD-10-CM

## 2013-04-23 MED ORDER — CIPROFLOXACIN HCL 500 MG PO TABS: 500.0000 mg | ORAL_TABLET | Freq: Two times a day (BID) | ORAL | Status: DC

## 2013-04-23 NOTE — Telephone Encounter (Signed)
Call patient : Prescription refilled & sent to pharmacy in EP[facility]. 

## 2013-04-23 NOTE — Telephone Encounter (Signed)
Left message on pt vm that her rx for the urine culture sent to her drug store.  And would need to repeat urine after completing the antibiotic

## 2013-04-23 NOTE — Telephone Encounter (Signed)
Left message with pt that we have received urine and labs and will have Dr Modesto CharonWong review

## 2013-04-23 NOTE — Telephone Encounter (Signed)
Number provided for her home health is wrong and therefore unable to inform them that she will need urinalysis after completing antibiotic Pt is aware

## 2013-04-24 ENCOUNTER — Telehealth: Payer: Self-pay | Admitting: *Deleted

## 2013-04-24 DIAGNOSIS — N390 Urinary tract infection, site not specified: Secondary | ICD-10-CM

## 2013-04-24 MED ORDER — SULFAMETHOXAZOLE-TMP DS 800-160 MG PO TABS: 1.0000 | ORAL_TABLET | Freq: Two times a day (BID) | ORAL | Status: DC

## 2013-04-24 NOTE — Telephone Encounter (Signed)
Septra DS ordered. Rck UA and C&S in 10 days.

## 2013-04-24 NOTE — Telephone Encounter (Signed)
Patient prescribed Cipro for UTI. Relates that it made her "feel bad" the last time she took it.  She doesn't want to take it and would like to see if there is something else she can take.

## 2013-05-08 ENCOUNTER — Telehealth: Payer: Self-pay | Admitting: Family Medicine

## 2013-05-10 ENCOUNTER — Encounter: Payer: Self-pay | Admitting: Family Medicine

## 2013-05-10 ENCOUNTER — Ambulatory Visit (INDEPENDENT_AMBULATORY_CARE_PROVIDER_SITE_OTHER): Payer: Medicare Other | Admitting: Family Medicine

## 2013-05-10 VITALS — BP 107/75 | HR 69 | Temp 99.1°F | Ht 63.0 in | Wt 253.0 lb

## 2013-05-10 DIAGNOSIS — M129 Arthropathy, unspecified: Secondary | ICD-10-CM

## 2013-05-10 DIAGNOSIS — R350 Frequency of micturition: Secondary | ICD-10-CM | POA: Insufficient documentation

## 2013-05-10 DIAGNOSIS — E669 Obesity, unspecified: Secondary | ICD-10-CM | POA: Insufficient documentation

## 2013-05-10 DIAGNOSIS — I10 Essential (primary) hypertension: Secondary | ICD-10-CM | POA: Insufficient documentation

## 2013-05-10 DIAGNOSIS — K439 Ventral hernia without obstruction or gangrene: Secondary | ICD-10-CM | POA: Insufficient documentation

## 2013-05-10 DIAGNOSIS — M1990 Unspecified osteoarthritis, unspecified site: Secondary | ICD-10-CM | POA: Insufficient documentation

## 2013-05-10 DIAGNOSIS — R42 Dizziness and giddiness: Secondary | ICD-10-CM | POA: Diagnosis not present

## 2013-05-10 LAB — POCT UA - MICROSCOPIC ONLY
Bacteria, U Microscopic: NEGATIVE
Casts, Ur, LPF, POC: NEGATIVE
Crystals, Ur, HPF, POC: NEGATIVE
Mucus, UA: NEGATIVE
Yeast, UA: NEGATIVE

## 2013-05-10 LAB — POCT URINALYSIS DIPSTICK
Bilirubin, UA: NEGATIVE
Glucose, UA: NEGATIVE
Ketones, UA: NEGATIVE
Nitrite, UA: NEGATIVE
Protein, UA: NEGATIVE
Spec Grav, UA: 1.01
Urobilinogen, UA: NEGATIVE
pH, UA: 6.5

## 2013-05-10 NOTE — Progress Notes (Signed)
Patient ID: Megan Fisher, female   DOB: [birth year], 80 y.o.   MRN: [id number] SUBJECTIVE: CC: Chief Complaint  Patient presents with  . Acute Visit    urinary incontinence completed sulfa antibiotic . wants K+ checked     HPI: Breakfast: Eggos waffles, honey LUnch: veggetable plate with either fish and chicken Supper: left overs.  Here for follow up. Was dehydrated and weak and  Sent to NH for 3 months rehab and was more ambulatory. According to caregiver patient has lapsed back into sitting and watching TV.patient has arthritis of knees and various parts of her body. Needs K checked Has frequency of urine. Had a UTI treated in the NH facility and was ttreated and resolved but needs to be recheck.   Past Medical History  Diagnosis Date  . Hernia   . Arthritis   . Hypertension   . Vertigo   . Obesity    Past Surgical History  Procedure Laterality Date  . Ooperectomy    . Tonsillectomy    . Hernia repair    . Cholecystectomy    . Breast surgery     History   Social History  . Marital Status: Widowed    Spouse Name: N/A    Number of Children: N/A  . Years of Education: N/A   Occupational History  . Not on file.   Social History Main Topics  . Smoking status: Never Smoker   . Smokeless tobacco: Not on file  . Alcohol Use: No  . Drug Use: No  . Sexual Activity:    Other Topics Concern  . Not on file   Social History Narrative  . No narrative on file   No family history on file. Current Outpatient Prescriptions on File Prior to Visit  Medication Sig Dispense Refill  . furosemide (LASIX) 40 MG tablet Take 1 tablet (40 mg total) by mouth daily.  30 tablet  5  . meclizine (ANTIVERT) 25 MG tablet Take 1 tablet (25 mg total) by mouth 3 (three) times daily as needed.  60 tablet  3  . PARoxetine (PAXIL) 20 MG tablet Take 1 tablet (20 mg total) by mouth every morning.  30 tablet  5  . potassium chloride SA (K-DUR,KLOR-CON) 20 MEQ tablet Take 1 tablet (20 mEq total) by  mouth daily.  30 tablet  3   No current facility-administered medications on file prior to visit.   Allergies  Allergen Reactions  . Aspirin   . Citalopram   . Oxycontin [Oxycodone Hcl]   . Penicillins     There is no immunization history on file for this patient. Prior to Admission medications   Medication Sig Start Date End Date Taking? Authorizing Provider  ciprofloxacin (CIPRO) 500 MG tablet Take 1 tablet (500 mg total) by mouth 2 (two) times daily. 04/23/13   Vernie Shanks, MD  furosemide (LASIX) 40 MG tablet Take 1 tablet (40 mg total) by mouth daily. 12/15/12   Lysbeth Penner, FNP  meclizine (ANTIVERT) 25 MG tablet Take 1 tablet (25 mg total) by mouth 3 (three) times daily as needed. 12/15/12   Lysbeth Penner, FNP  meclizine (ANTIVERT) 25 MG tablet TAKE  (1)  TABLET TWICE A DAY AS NEEDED. 03/24/13   Lysbeth Penner, FNP  metolazone (ZAROXOLYN) 2.5 MG tablet One po qd one half hour prior to taking lasix for edema. 12/15/12   Lysbeth Penner, FNP  omeprazole (PRILOSEC) 20 MG capsule Take 1 capsule (20 mg total) by  mouth daily. 12/15/12   Lysbeth Penner, FNP  PARoxetine (PAXIL) 20 MG tablet Take 1 tablet (20 mg total) by mouth every morning. 12/15/12   Lysbeth Penner, FNP  potassium chloride SA (K-DUR,KLOR-CON) 20 MEQ tablet Take 1 tablet (20 mEq total) by mouth daily. 12/16/12   Lysbeth Penner, FNP  sulfamethoxazole-trimethoprim (BACTRIM DS) 800-160 MG per tablet Take 1 tablet by mouth 2 (two) times daily. With food 04/24/13   Chipper Herb, MD     ROS: As above in the HPI. All other systems are stable or negative.  OBJECTIVE: APPEARANCE:  Patient in no acute distress.The patient appeared well nourished and normally developed. Acyanotic. Waist: VITAL SIGNS:BP 107/75  Pulse 69  Temp(Src) 99.1 F (37.3 C) (Oral)  Ht '5\' 3"'  (1.6 m)  Wt 253 lb (114.76 kg)  BMI 44.83 kg/m2  Obese WF In a wheelchair.  SKIN: warm and  Dry without overt rashes, tattoos and scars  HEAD  and Neck: without JVD, Head and scalp: normal Eyes:No scleral icterus. Fundi normal, eye movements normal. Ears: Auricle normal, canal normal, Tympanic membranes normal, insufflation normal. Nose: normal Throat: normal Neck & thyroid: normal  CHEST & LUNGS: Chest wall: normal Lungs: Clear  CVS: Reveals the PMI to be normally located. Regular rhythm, First and Second Heart sounds are normal,  absence of murmurs, rubs or gallops. Peripheral vasculature: Radial pulses: normal Dorsal pedis pulses: normal Posterior pulses: normal  ABDOMEN:  Appearance: obese Benign, no organomegaly, no masses, no Abdominal Aortic enlargement. No Guarding , no rebound. No Bruits. Bowel sounds: normal  RECTAL: N/A GU: N/A  EXTREMETIES: nonedematous.  MUSCULOSKELETAL:  Spine: normal Joints: knees crepitus right knee worse than left   NEUROLOGIC: oriented to time,place and person; nonfocal.  ASSESSMENT:  Urine frequency - Plan: POCT UA - Microscopic Only, POCT urinalysis dipstick, Urine culture  Arthritis  Vertigo  Hypertension - Plan: CMP14+EGFR  Obesity  PLAN: Discussed changing diet to more of a plant based  Diet. Low salt diet  Demonstrated exercises in a chair.  Orders Placed This Encounter  Procedures  . Urine culture  . CMP14+EGFR  . POCT UA - Microscopic Only  . POCT urinalysis dipstick  reviewed records in EPIC. No orders of the defined types were placed in this encounter.   Medications Discontinued During This Encounter  Medication Reason  . meclizine (ANTIVERT) 25 MG tablet Duplicate  . metolazone (ZAROXOLYN) 2.5 MG tablet Completed Course  . ciprofloxacin (CIPRO) 500 MG tablet Completed Course  . sulfamethoxazole-trimethoprim (BACTRIM DS) 800-160 MG per tablet Completed Course  . omeprazole (PRILOSEC) 20 MG capsule Completed Course   Return in about 2 months (around 07/08/2013) for Recheck medical problems.  Makaylen Thieme P. Jacelyn Grip, M.D.

## 2013-05-10 NOTE — Telephone Encounter (Signed)
Pt needs appt with [facility] for urinary incontinence appt scheduled

## 2013-05-11 LAB — CMP14+EGFR
ALT: 14 IU/L (ref 0–32)
AST: 20 IU/L (ref 0–40)
Albumin/Globulin Ratio: 1.7 (ref 1.1–2.5)
Albumin: 3.9 g/dL (ref 3.5–4.7)
Alkaline Phosphatase: 74 IU/L (ref 39–117)
BUN/Creatinine Ratio: 17 (ref 11–26)
BUN: 17 mg/dL (ref 8–27)
CO2: 26 mmol/L (ref 18–29)
Calcium: 9.5 mg/dL (ref 8.7–10.3)
Chloride: 99 mmol/L (ref 97–108)
Creatinine, Ser: 0.99 mg/dL (ref 0.57–1.00)
GFR calc Af Amer: 61 mL/min/{1.73_m2} (ref >59)
GFR calc non Af Amer: 53 mL/min/{1.73_m2} — ABNORMAL LOW (ref >59)
Globulin, Total: 2.3 g/dL (ref 1.5–4.5)
Glucose: 90 mg/dL (ref 65–99)
Potassium: 4.6 mmol/L (ref 3.5–5.2)
Sodium: 141 mmol/L (ref 134–144)
Total Bilirubin: 0.4 mg/dL (ref 0.0–1.2)
Total Protein: 6.2 g/dL (ref 6.0–8.5)

## 2013-05-11 LAB — URINE CULTURE

## 2013-05-16 ENCOUNTER — Other Ambulatory Visit: Payer: Self-pay | Admitting: Family Medicine

## 2013-05-16 DIAGNOSIS — R8290 Unspecified abnormal findings in urine: Secondary | ICD-10-CM

## 2013-05-16 NOTE — Progress Notes (Signed)
Quick Note:  Call Patient Labs that are abnormal: Urine culture was abnormal because it was not a clean catch.  The rest are at goal  Recommendations: Repeat Urine culture. Ordered in EPIC. Needs it to be midstream clean catch.   ______

## 2013-05-20 ENCOUNTER — Other Ambulatory Visit (INDEPENDENT_AMBULATORY_CARE_PROVIDER_SITE_OTHER): Payer: Medicare Other

## 2013-05-20 DIAGNOSIS — R82998 Other abnormal findings in urine: Secondary | ICD-10-CM | POA: Diagnosis not present

## 2013-05-20 NOTE — Progress Notes (Signed)
Pt came in for labs only 

## 2013-05-21 ENCOUNTER — Telehealth: Payer: Self-pay | Admitting: Family Medicine

## 2013-05-21 NOTE — Telephone Encounter (Signed)
Labs not back yet patient aware  

## 2013-05-22 LAB — URINE CULTURE

## 2013-05-23 ENCOUNTER — Other Ambulatory Visit: Payer: Self-pay | Admitting: Family Medicine

## 2013-05-23 DIAGNOSIS — N390 Urinary tract infection, site not specified: Secondary | ICD-10-CM

## 2013-05-23 MED ORDER — SULFAMETHOXAZOLE-TMP DS 800-160 MG PO TABS: 1.0000 | ORAL_TABLET | Freq: Two times a day (BID) | ORAL | Status: DC

## 2013-05-23 NOTE — Progress Notes (Signed)
Quick Note:  Call Patient Labs that are abnormal: UCx grew 2 bacterias sensitive to bactrim  Recommendations: Will order for bactrim in EPIC   ______

## 2013-05-25 ENCOUNTER — Telehealth: Payer: Self-pay | Admitting: Family Medicine

## 2013-05-25 NOTE — Telephone Encounter (Signed)
Pt.notified

## 2013-06-03 ENCOUNTER — Telehealth: Payer: Self-pay | Admitting: Family Medicine

## 2013-06-03 NOTE — Telephone Encounter (Signed)
Continue the potassium.it is keeping it normal Mercedes Valeriano P. Modesto CharonWong, M.D.

## 2013-06-04 NOTE — Telephone Encounter (Signed)
Patient aware.

## 2013-06-21 ENCOUNTER — Other Ambulatory Visit: Payer: Self-pay | Admitting: Family Medicine

## 2013-06-23 ENCOUNTER — Telehealth: Payer: Self-pay | Admitting: Family Medicine

## 2013-06-23 NOTE — Telephone Encounter (Signed)
SHE FOUND SOME MORE ANTIVERT AND DOES NOT NEED THIS CALLED IN NOW

## 2013-07-14 ENCOUNTER — Telehealth: Payer: Self-pay | Admitting: Family Medicine

## 2013-07-14 ENCOUNTER — Other Ambulatory Visit (INDEPENDENT_AMBULATORY_CARE_PROVIDER_SITE_OTHER): Payer: Medicare Other

## 2013-07-14 DIAGNOSIS — E878 Other disorders of electrolyte and fluid balance, not elsewhere classified: Secondary | ICD-10-CM

## 2013-07-14 MED ORDER — POTASSIUM CHLORIDE CRYS ER 20 MEQ PO TBCR: EXTENDED_RELEASE_TABLET | ORAL | Status: DC

## 2013-07-14 NOTE — Progress Notes (Signed)
Pt came in for lab  only 

## 2013-07-14 NOTE — Telephone Encounter (Signed)
Pt has been taking potassium BID and only supposed to take it QD>>>>>> I have ordered a refill at [facility] because she will run out early and the caregivers will bring her by this week for a recheck BMP to make sure potassium is stable. BMP ordered. FYI_____ Dr Modesto CharonWONG!!!

## 2013-07-15 LAB — BMP8+EGFR
BUN/Creatinine Ratio: 17 (ref 11–26)
BUN: 16 mg/dL (ref 8–27)
CO2: 26 mmol/L (ref 18–29)
Calcium: 9.6 mg/dL (ref 8.7–10.3)
Chloride: 102 mmol/L (ref 97–108)
Creatinine, Ser: 0.93 mg/dL (ref 0.57–1.00)
GFR calc Af Amer: 66 mL/min/{1.73_m2} (ref >59)
GFR calc non Af Amer: 57 mL/min/{1.73_m2} — ABNORMAL LOW (ref >59)
Glucose: 99 mg/dL (ref 65–99)
Potassium: 4 mmol/L (ref 3.5–5.2)
Sodium: 143 mmol/L (ref 134–144)

## 2013-07-17 NOTE — Progress Notes (Signed)
Quick Note:  Call patient. Labs normal. No change in plan. ______ 

## 2013-07-18 NOTE — Telephone Encounter (Signed)
Take the potassium once daily.

## 2013-07-19 ENCOUNTER — Telehealth: Payer: Self-pay | Admitting: Family Medicine

## 2013-07-19 NOTE — Telephone Encounter (Signed)
done

## 2013-07-19 NOTE — Telephone Encounter (Signed)
Aurea GraffJoan, caregiver, notified and advised to call if questions

## 2013-09-06 ENCOUNTER — Other Ambulatory Visit: Payer: Self-pay | Admitting: Family Medicine

## 2013-09-09 DIAGNOSIS — B372 Candidiasis of skin and nail: Secondary | ICD-10-CM | POA: Diagnosis not present

## 2013-09-09 DIAGNOSIS — K649 Unspecified hemorrhoids: Secondary | ICD-10-CM | POA: Diagnosis not present

## 2013-09-09 DIAGNOSIS — L299 Pruritus, unspecified: Secondary | ICD-10-CM | POA: Diagnosis not present

## 2013-09-14 ENCOUNTER — Other Ambulatory Visit: Payer: Self-pay | Admitting: Family Medicine

## 2013-09-15 NOTE — Telephone Encounter (Signed)
Last seen 11/2012 

## 2013-09-27 ENCOUNTER — Encounter: Payer: Self-pay | Admitting: General Practice

## 2013-09-27 ENCOUNTER — Ambulatory Visit (INDEPENDENT_AMBULATORY_CARE_PROVIDER_SITE_OTHER): Payer: Medicare Other | Admitting: General Practice

## 2013-09-27 VITALS — BP 125/79 | HR 66 | Temp 98.2°F | Ht 63.0 in | Wt 259.4 lb

## 2013-09-27 DIAGNOSIS — R42 Dizziness and giddiness: Secondary | ICD-10-CM | POA: Diagnosis not present

## 2013-09-27 NOTE — Patient Instructions (Signed)

## 2013-09-27 NOTE — Progress Notes (Signed)
   Subjective:    Patient ID: Megan Fisher, female    DOB: 01/06/1929, 80 y.o.   MRN: [id number]  Dizziness This is a new problem. The current episode started in the past 7 days. The problem occurs rarely. The problem has been gradually improving. Pertinent negatives include no chest pain, chills, coughing, fever, headaches, nausea, numbness, sore throat, swollen glands, visual change or weakness. Associated symptoms comments: History of vertigo. Nothing aggravates the symptoms. She has tried nothing for the symptoms.  Patient reports potassium level was abnormal with these same symptoms in the past. Patient reports periodic burning with urination, but unable to provide office with sample today. Will return with urine specimen.     Review of Systems  Constitutional: Negative for fever and chills.  HENT: Negative for sore throat.   Respiratory: Negative for cough and chest tightness.   Cardiovascular: Negative for chest pain and palpitations.  Gastrointestinal: Negative for nausea.  Neurological: Positive for dizziness. Negative for syncope, speech difficulty, weakness, numbness and headaches.       Objective:   Physical Exam  Constitutional: She is oriented to person, place, and time. She appears well-developed and well-nourished.  HENT:  Head: Normocephalic and atraumatic.  Right Ear: External ear normal.  Left Ear: External ear normal.  Nose: Nose normal.  Neck: Normal range of motion. Neck supple. No thyromegaly present.  Cardiovascular: Normal rate, regular rhythm and normal heart sounds.   Pulmonary/Chest: Effort normal and breath sounds normal. No respiratory distress. She exhibits no tenderness.  Lymphadenopathy:    She has no cervical adenopathy.  Neurological: She is alert and oriented to person, place, and time.  Skin: Skin is warm and dry.  Psychiatric: She has a normal mood and affect.          Assessment & Plan:  1. Dizziness - BMP8+EGFR - POCT urinalysis  dipstick; Future - POCT UA - Microscopic Only; Future -discussed and provided educational material on dizziness -discussed decreasing falls -RTO if symptoms worsen or seek emergency medical treatement -Return urine sample to office, container provided -Patient and caregiver verbalized understanding

## 2013-09-28 ENCOUNTER — Other Ambulatory Visit (INDEPENDENT_AMBULATORY_CARE_PROVIDER_SITE_OTHER): Payer: Medicare Other

## 2013-09-28 DIAGNOSIS — R42 Dizziness and giddiness: Secondary | ICD-10-CM

## 2013-09-28 LAB — POCT URINALYSIS DIPSTICK
BILIRUBIN UA: NEGATIVE
Glucose, UA: NEGATIVE
Ketones, UA: NEGATIVE
Leukocytes, UA: NEGATIVE
NITRITE UA: NEGATIVE
Protein, UA: NEGATIVE
SPEC GRAV UA: 1.02
Urobilinogen, UA: NEGATIVE
pH, UA: 6

## 2013-09-28 LAB — BMP8+EGFR
BUN/Creatinine Ratio: 13 (ref 11–26)
BUN: 13 mg/dL (ref 8–27)
CO2: 24 mmol/L (ref 18–29)
CREATININE: 1 mg/dL (ref 0.57–1.00)
Calcium: 10 mg/dL (ref 8.7–10.3)
Chloride: 99 mmol/L (ref 97–108)
GFR calc Af Amer: 60 mL/min/{1.73_m2} (ref >59)
GFR, EST NON AFRICAN AMERICAN: 52 mL/min/{1.73_m2} — AB (ref >59)
Glucose: 97 mg/dL (ref 65–99)
Potassium: 4.2 mmol/L (ref 3.5–5.2)
SODIUM: 142 mmol/L (ref 134–144)

## 2013-09-28 LAB — POCT UA - MICROSCOPIC ONLY
BACTERIA, U MICROSCOPIC: NEGATIVE
Casts, Ur, LPF, POC: NEGATIVE
Mucus, UA: NEGATIVE
WBC, Ur, HPF, POC: NEGATIVE
Yeast, UA: NEGATIVE

## 2013-09-28 NOTE — Progress Notes (Signed)
Patient dropped off urine specimen

## 2013-10-07 ENCOUNTER — Telehealth: Payer: Self-pay | Admitting: General Practice

## 2013-10-07 NOTE — Telephone Encounter (Signed)
Discussed with caregiver that her results look normal. Pt questioned whether to stay on her potassium supplement.  Advised her to continue all current treatments for now because her potassium was WNL.  Caregiver stated understanding and will discuss with patient. She will f/u as needed.

## 2013-11-04 ENCOUNTER — Other Ambulatory Visit: Payer: Self-pay | Admitting: Family Medicine

## 2013-11-15 ENCOUNTER — Other Ambulatory Visit: Payer: Self-pay | Admitting: Family Medicine

## 2013-12-16 ENCOUNTER — Other Ambulatory Visit: Payer: Self-pay | Admitting: Family Medicine

## 2014-01-06 ENCOUNTER — Other Ambulatory Visit: Payer: Self-pay | Admitting: *Deleted

## 2014-02-10 ENCOUNTER — Other Ambulatory Visit: Payer: Self-pay | Admitting: *Deleted

## 2014-02-10 DIAGNOSIS — E878 Other disorders of electrolyte and fluid balance, not elsewhere classified: Secondary | ICD-10-CM

## 2014-02-10 MED ORDER — POTASSIUM CHLORIDE CRYS ER 20 MEQ PO TBCR: EXTENDED_RELEASE_TABLET | ORAL | Status: DC

## 2014-02-11 DIAGNOSIS — R262 Difficulty in walking, not elsewhere classified: Secondary | ICD-10-CM | POA: Diagnosis not present

## 2014-02-11 DIAGNOSIS — Z885 Allergy status to narcotic agent status: Secondary | ICD-10-CM | POA: Diagnosis not present

## 2014-02-11 DIAGNOSIS — I10 Essential (primary) hypertension: Secondary | ICD-10-CM | POA: Diagnosis not present

## 2014-02-11 DIAGNOSIS — F411 Generalized anxiety disorder: Secondary | ICD-10-CM | POA: Diagnosis not present

## 2014-02-11 DIAGNOSIS — Z91040 Latex allergy status: Secondary | ICD-10-CM | POA: Diagnosis not present

## 2014-02-11 DIAGNOSIS — Z881 Allergy status to other antibiotic agents status: Secondary | ICD-10-CM | POA: Diagnosis not present

## 2014-02-11 DIAGNOSIS — Z880 Allergy status to penicillin: Secondary | ICD-10-CM | POA: Diagnosis not present

## 2014-02-11 DIAGNOSIS — Z6841 Body Mass Index (BMI) 40.0 and over, adult: Secondary | ICD-10-CM | POA: Diagnosis not present

## 2014-02-11 DIAGNOSIS — R400 Somnolence: Secondary | ICD-10-CM | POA: Diagnosis not present

## 2014-02-11 DIAGNOSIS — M1990 Unspecified osteoarthritis, unspecified site: Secondary | ICD-10-CM | POA: Diagnosis not present

## 2014-02-11 DIAGNOSIS — R55 Syncope and collapse: Secondary | ICD-10-CM | POA: Diagnosis not present

## 2014-02-11 DIAGNOSIS — R42 Dizziness and giddiness: Secondary | ICD-10-CM | POA: Diagnosis not present

## 2014-02-12 DIAGNOSIS — R55 Syncope and collapse: Secondary | ICD-10-CM | POA: Diagnosis not present

## 2014-02-12 DIAGNOSIS — I10 Essential (primary) hypertension: Secondary | ICD-10-CM | POA: Diagnosis not present

## 2014-02-12 DIAGNOSIS — R262 Difficulty in walking, not elsewhere classified: Secondary | ICD-10-CM | POA: Diagnosis not present

## 2014-02-13 DIAGNOSIS — R262 Difficulty in walking, not elsewhere classified: Secondary | ICD-10-CM | POA: Diagnosis not present

## 2014-02-13 DIAGNOSIS — R55 Syncope and collapse: Secondary | ICD-10-CM | POA: Diagnosis not present

## 2014-02-13 DIAGNOSIS — I10 Essential (primary) hypertension: Secondary | ICD-10-CM | POA: Diagnosis not present

## 2014-02-14 DIAGNOSIS — I10 Essential (primary) hypertension: Secondary | ICD-10-CM | POA: Diagnosis not present

## 2014-02-14 DIAGNOSIS — R55 Syncope and collapse: Secondary | ICD-10-CM | POA: Diagnosis not present

## 2014-02-14 DIAGNOSIS — Z6841 Body Mass Index (BMI) 40.0 and over, adult: Secondary | ICD-10-CM | POA: Diagnosis not present

## 2014-02-14 DIAGNOSIS — R262 Difficulty in walking, not elsewhere classified: Secondary | ICD-10-CM | POA: Diagnosis not present

## 2014-02-15 DIAGNOSIS — I10 Essential (primary) hypertension: Secondary | ICD-10-CM | POA: Diagnosis not present

## 2014-02-15 DIAGNOSIS — R55 Syncope and collapse: Secondary | ICD-10-CM | POA: Diagnosis not present

## 2014-02-15 DIAGNOSIS — R262 Difficulty in walking, not elsewhere classified: Secondary | ICD-10-CM | POA: Diagnosis not present

## 2014-02-15 DIAGNOSIS — Z6841 Body Mass Index (BMI) 40.0 and over, adult: Secondary | ICD-10-CM | POA: Diagnosis not present

## 2014-02-17 ENCOUNTER — Ambulatory Visit: Payer: Medicare Other

## 2014-02-17 DIAGNOSIS — F419 Anxiety disorder, unspecified: Secondary | ICD-10-CM | POA: Diagnosis not present

## 2014-02-17 DIAGNOSIS — N059 Unspecified nephritic syndrome with unspecified morphologic changes: Secondary | ICD-10-CM | POA: Diagnosis not present

## 2014-02-17 DIAGNOSIS — I10 Essential (primary) hypertension: Secondary | ICD-10-CM | POA: Diagnosis not present

## 2014-02-17 DIAGNOSIS — M150 Primary generalized (osteo)arthritis: Secondary | ICD-10-CM | POA: Diagnosis not present

## 2014-02-17 DIAGNOSIS — R55 Syncope and collapse: Secondary | ICD-10-CM | POA: Diagnosis not present

## 2014-02-18 DIAGNOSIS — R55 Syncope and collapse: Secondary | ICD-10-CM | POA: Diagnosis not present

## 2014-02-18 DIAGNOSIS — I10 Essential (primary) hypertension: Secondary | ICD-10-CM | POA: Diagnosis not present

## 2014-02-18 DIAGNOSIS — N059 Unspecified nephritic syndrome with unspecified morphologic changes: Secondary | ICD-10-CM | POA: Diagnosis not present

## 2014-02-18 DIAGNOSIS — F419 Anxiety disorder, unspecified: Secondary | ICD-10-CM | POA: Diagnosis not present

## 2014-02-18 DIAGNOSIS — M150 Primary generalized (osteo)arthritis: Secondary | ICD-10-CM | POA: Diagnosis not present

## 2014-02-22 DIAGNOSIS — Z1389 Encounter for screening for other disorder: Secondary | ICD-10-CM | POA: Diagnosis not present

## 2014-02-22 DIAGNOSIS — N3000 Acute cystitis without hematuria: Secondary | ICD-10-CM | POA: Diagnosis not present

## 2014-02-22 DIAGNOSIS — M150 Primary generalized (osteo)arthritis: Secondary | ICD-10-CM | POA: Diagnosis not present

## 2014-02-22 DIAGNOSIS — R55 Syncope and collapse: Secondary | ICD-10-CM | POA: Diagnosis not present

## 2014-02-22 DIAGNOSIS — H8113 Benign paroxysmal vertigo, bilateral: Secondary | ICD-10-CM | POA: Diagnosis not present

## 2014-02-22 DIAGNOSIS — N059 Unspecified nephritic syndrome with unspecified morphologic changes: Secondary | ICD-10-CM | POA: Diagnosis not present

## 2014-02-22 DIAGNOSIS — I10 Essential (primary) hypertension: Secondary | ICD-10-CM | POA: Diagnosis not present

## 2014-02-22 DIAGNOSIS — F419 Anxiety disorder, unspecified: Secondary | ICD-10-CM | POA: Diagnosis not present

## 2014-02-22 DIAGNOSIS — Z0000 Encounter for general adult medical examination without abnormal findings: Secondary | ICD-10-CM | POA: Diagnosis not present

## 2014-02-23 DIAGNOSIS — F419 Anxiety disorder, unspecified: Secondary | ICD-10-CM | POA: Diagnosis not present

## 2014-02-23 DIAGNOSIS — N059 Unspecified nephritic syndrome with unspecified morphologic changes: Secondary | ICD-10-CM | POA: Diagnosis not present

## 2014-02-23 DIAGNOSIS — R55 Syncope and collapse: Secondary | ICD-10-CM | POA: Diagnosis not present

## 2014-02-23 DIAGNOSIS — I10 Essential (primary) hypertension: Secondary | ICD-10-CM | POA: Diagnosis not present

## 2014-02-23 DIAGNOSIS — M150 Primary generalized (osteo)arthritis: Secondary | ICD-10-CM | POA: Diagnosis not present

## 2014-02-24 DIAGNOSIS — N059 Unspecified nephritic syndrome with unspecified morphologic changes: Secondary | ICD-10-CM | POA: Diagnosis not present

## 2014-02-24 DIAGNOSIS — M150 Primary generalized (osteo)arthritis: Secondary | ICD-10-CM | POA: Diagnosis not present

## 2014-02-24 DIAGNOSIS — I10 Essential (primary) hypertension: Secondary | ICD-10-CM | POA: Diagnosis not present

## 2014-02-24 DIAGNOSIS — F419 Anxiety disorder, unspecified: Secondary | ICD-10-CM | POA: Diagnosis not present

## 2014-02-24 DIAGNOSIS — R55 Syncope and collapse: Secondary | ICD-10-CM | POA: Diagnosis not present

## 2014-02-25 DIAGNOSIS — F419 Anxiety disorder, unspecified: Secondary | ICD-10-CM | POA: Diagnosis not present

## 2014-02-25 DIAGNOSIS — M150 Primary generalized (osteo)arthritis: Secondary | ICD-10-CM | POA: Diagnosis not present

## 2014-02-25 DIAGNOSIS — N059 Unspecified nephritic syndrome with unspecified morphologic changes: Secondary | ICD-10-CM | POA: Diagnosis not present

## 2014-02-25 DIAGNOSIS — R55 Syncope and collapse: Secondary | ICD-10-CM | POA: Diagnosis not present

## 2014-02-25 DIAGNOSIS — I10 Essential (primary) hypertension: Secondary | ICD-10-CM | POA: Diagnosis not present

## 2014-02-26 DIAGNOSIS — I10 Essential (primary) hypertension: Secondary | ICD-10-CM | POA: Diagnosis not present

## 2014-02-26 DIAGNOSIS — F419 Anxiety disorder, unspecified: Secondary | ICD-10-CM | POA: Diagnosis not present

## 2014-02-26 DIAGNOSIS — M150 Primary generalized (osteo)arthritis: Secondary | ICD-10-CM | POA: Diagnosis not present

## 2014-02-26 DIAGNOSIS — R55 Syncope and collapse: Secondary | ICD-10-CM | POA: Diagnosis not present

## 2014-02-26 DIAGNOSIS — N059 Unspecified nephritic syndrome with unspecified morphologic changes: Secondary | ICD-10-CM | POA: Diagnosis not present

## 2014-03-01 DIAGNOSIS — I10 Essential (primary) hypertension: Secondary | ICD-10-CM | POA: Diagnosis not present

## 2014-03-01 DIAGNOSIS — N059 Unspecified nephritic syndrome with unspecified morphologic changes: Secondary | ICD-10-CM | POA: Diagnosis not present

## 2014-03-01 DIAGNOSIS — M150 Primary generalized (osteo)arthritis: Secondary | ICD-10-CM | POA: Diagnosis not present

## 2014-03-01 DIAGNOSIS — R55 Syncope and collapse: Secondary | ICD-10-CM | POA: Diagnosis not present

## 2014-03-01 DIAGNOSIS — F419 Anxiety disorder, unspecified: Secondary | ICD-10-CM | POA: Diagnosis not present

## 2014-03-02 DIAGNOSIS — I10 Essential (primary) hypertension: Secondary | ICD-10-CM | POA: Diagnosis not present

## 2014-03-02 DIAGNOSIS — N059 Unspecified nephritic syndrome with unspecified morphologic changes: Secondary | ICD-10-CM | POA: Diagnosis not present

## 2014-03-02 DIAGNOSIS — F419 Anxiety disorder, unspecified: Secondary | ICD-10-CM | POA: Diagnosis not present

## 2014-03-02 DIAGNOSIS — M150 Primary generalized (osteo)arthritis: Secondary | ICD-10-CM | POA: Diagnosis not present

## 2014-03-02 DIAGNOSIS — R55 Syncope and collapse: Secondary | ICD-10-CM | POA: Diagnosis not present

## 2014-03-03 DIAGNOSIS — I10 Essential (primary) hypertension: Secondary | ICD-10-CM | POA: Diagnosis not present

## 2014-03-03 DIAGNOSIS — N059 Unspecified nephritic syndrome with unspecified morphologic changes: Secondary | ICD-10-CM | POA: Diagnosis not present

## 2014-03-03 DIAGNOSIS — F419 Anxiety disorder, unspecified: Secondary | ICD-10-CM | POA: Diagnosis not present

## 2014-03-03 DIAGNOSIS — M150 Primary generalized (osteo)arthritis: Secondary | ICD-10-CM | POA: Diagnosis not present

## 2014-03-03 DIAGNOSIS — R55 Syncope and collapse: Secondary | ICD-10-CM | POA: Diagnosis not present

## 2014-03-06 DIAGNOSIS — I10 Essential (primary) hypertension: Secondary | ICD-10-CM | POA: Diagnosis not present

## 2014-03-06 DIAGNOSIS — N059 Unspecified nephritic syndrome with unspecified morphologic changes: Secondary | ICD-10-CM | POA: Diagnosis not present

## 2014-03-06 DIAGNOSIS — M150 Primary generalized (osteo)arthritis: Secondary | ICD-10-CM | POA: Diagnosis not present

## 2014-03-06 DIAGNOSIS — R55 Syncope and collapse: Secondary | ICD-10-CM | POA: Diagnosis not present

## 2014-03-06 DIAGNOSIS — F419 Anxiety disorder, unspecified: Secondary | ICD-10-CM | POA: Diagnosis not present

## 2014-03-10 ENCOUNTER — Other Ambulatory Visit: Payer: Self-pay | Admitting: Family Medicine

## 2014-03-16 DIAGNOSIS — F419 Anxiety disorder, unspecified: Secondary | ICD-10-CM | POA: Diagnosis not present

## 2014-03-16 DIAGNOSIS — N059 Unspecified nephritic syndrome with unspecified morphologic changes: Secondary | ICD-10-CM | POA: Diagnosis not present

## 2014-03-16 DIAGNOSIS — I10 Essential (primary) hypertension: Secondary | ICD-10-CM | POA: Diagnosis not present

## 2014-03-16 DIAGNOSIS — M150 Primary generalized (osteo)arthritis: Secondary | ICD-10-CM | POA: Diagnosis not present

## 2014-03-16 DIAGNOSIS — R55 Syncope and collapse: Secondary | ICD-10-CM | POA: Diagnosis not present

## 2014-03-22 DIAGNOSIS — M545 Low back pain: Secondary | ICD-10-CM | POA: Diagnosis not present

## 2014-03-23 DIAGNOSIS — N059 Unspecified nephritic syndrome with unspecified morphologic changes: Secondary | ICD-10-CM | POA: Diagnosis not present

## 2014-03-23 DIAGNOSIS — M150 Primary generalized (osteo)arthritis: Secondary | ICD-10-CM | POA: Diagnosis not present

## 2014-03-23 DIAGNOSIS — R55 Syncope and collapse: Secondary | ICD-10-CM | POA: Diagnosis not present

## 2014-03-23 DIAGNOSIS — I10 Essential (primary) hypertension: Secondary | ICD-10-CM | POA: Diagnosis not present

## 2014-03-23 DIAGNOSIS — F419 Anxiety disorder, unspecified: Secondary | ICD-10-CM | POA: Diagnosis not present

## 2014-03-24 ENCOUNTER — Other Ambulatory Visit: Payer: Self-pay | Admitting: Family Medicine

## 2014-04-07 ENCOUNTER — Other Ambulatory Visit: Payer: Self-pay | Admitting: Family Medicine

## 2014-04-12 DIAGNOSIS — F419 Anxiety disorder, unspecified: Secondary | ICD-10-CM | POA: Diagnosis not present

## 2014-04-12 DIAGNOSIS — M150 Primary generalized (osteo)arthritis: Secondary | ICD-10-CM | POA: Diagnosis not present

## 2014-04-12 DIAGNOSIS — R55 Syncope and collapse: Secondary | ICD-10-CM | POA: Diagnosis not present

## 2014-04-12 DIAGNOSIS — I10 Essential (primary) hypertension: Secondary | ICD-10-CM | POA: Diagnosis not present

## 2014-04-12 DIAGNOSIS — N059 Unspecified nephritic syndrome with unspecified morphologic changes: Secondary | ICD-10-CM | POA: Diagnosis not present

## 2014-04-26 ENCOUNTER — Other Ambulatory Visit: Payer: Self-pay | Admitting: Family Medicine

## 2014-05-26 DIAGNOSIS — N3000 Acute cystitis without hematuria: Secondary | ICD-10-CM | POA: Diagnosis not present

## 2014-05-26 DIAGNOSIS — J45909 Unspecified asthma, uncomplicated: Secondary | ICD-10-CM | POA: Diagnosis not present

## 2014-07-19 DIAGNOSIS — R69 Illness, unspecified: Secondary | ICD-10-CM | POA: Diagnosis not present

## 2014-07-19 DIAGNOSIS — R4182 Altered mental status, unspecified: Secondary | ICD-10-CM | POA: Diagnosis not present

## 2014-07-19 DIAGNOSIS — N3000 Acute cystitis without hematuria: Secondary | ICD-10-CM | POA: Diagnosis not present

## 2014-07-27 DIAGNOSIS — T1490 Injury, unspecified: Secondary | ICD-10-CM | POA: Diagnosis not present

## 2014-08-08 DIAGNOSIS — Z87891 Personal history of nicotine dependence: Secondary | ICD-10-CM | POA: Diagnosis not present

## 2014-08-08 DIAGNOSIS — R51 Headache: Secondary | ICD-10-CM | POA: Diagnosis not present

## 2014-08-08 DIAGNOSIS — Z79899 Other long term (current) drug therapy: Secondary | ICD-10-CM | POA: Diagnosis not present

## 2014-08-08 DIAGNOSIS — R404 Transient alteration of awareness: Secondary | ICD-10-CM | POA: Diagnosis not present

## 2014-08-08 DIAGNOSIS — I6782 Cerebral ischemia: Secondary | ICD-10-CM | POA: Diagnosis not present

## 2014-08-08 DIAGNOSIS — I10 Essential (primary) hypertension: Secondary | ICD-10-CM | POA: Diagnosis not present

## 2014-08-08 DIAGNOSIS — R531 Weakness: Secondary | ICD-10-CM | POA: Diagnosis not present

## 2014-08-08 DIAGNOSIS — R197 Diarrhea, unspecified: Secondary | ICD-10-CM | POA: Diagnosis not present

## 2014-08-08 DIAGNOSIS — T494X5A Adverse effect of keratolytics, keratoplastics, and other hair treatment drugs and preparations, initial encounter: Secondary | ICD-10-CM | POA: Diagnosis not present

## 2014-08-08 DIAGNOSIS — R112 Nausea with vomiting, unspecified: Secondary | ICD-10-CM | POA: Diagnosis not present

## 2014-08-08 DIAGNOSIS — G319 Degenerative disease of nervous system, unspecified: Secondary | ICD-10-CM | POA: Diagnosis not present

## 2014-08-19 ENCOUNTER — Other Ambulatory Visit: Payer: Self-pay | Admitting: Family Medicine

## 2014-08-22 ENCOUNTER — Other Ambulatory Visit: Payer: Self-pay | Admitting: Family Medicine

## 2014-08-23 DIAGNOSIS — I5032 Chronic diastolic (congestive) heart failure: Secondary | ICD-10-CM | POA: Diagnosis not present

## 2014-09-21 DIAGNOSIS — R54 Age-related physical debility: Secondary | ICD-10-CM | POA: Diagnosis not present

## 2014-09-21 DIAGNOSIS — Z79899 Other long term (current) drug therapy: Secondary | ICD-10-CM | POA: Diagnosis not present

## 2014-09-21 DIAGNOSIS — F419 Anxiety disorder, unspecified: Secondary | ICD-10-CM | POA: Diagnosis not present

## 2014-09-21 DIAGNOSIS — R531 Weakness: Secondary | ICD-10-CM | POA: Diagnosis not present

## 2014-09-21 DIAGNOSIS — Z883 Allergy status to other anti-infective agents status: Secondary | ICD-10-CM | POA: Diagnosis not present

## 2014-09-21 DIAGNOSIS — Z808 Family history of malignant neoplasm of other organs or systems: Secondary | ICD-10-CM | POA: Diagnosis not present

## 2014-09-21 DIAGNOSIS — Z6841 Body Mass Index (BMI) 40.0 and over, adult: Secondary | ICD-10-CM | POA: Diagnosis not present

## 2014-09-21 DIAGNOSIS — I10 Essential (primary) hypertension: Secondary | ICD-10-CM | POA: Diagnosis not present

## 2014-09-21 DIAGNOSIS — Z8249 Family history of ischemic heart disease and other diseases of the circulatory system: Secondary | ICD-10-CM | POA: Diagnosis not present

## 2014-09-21 DIAGNOSIS — Z880 Allergy status to penicillin: Secondary | ICD-10-CM | POA: Diagnosis not present

## 2014-09-21 DIAGNOSIS — Z888 Allergy status to other drugs, medicaments and biological substances status: Secondary | ICD-10-CM | POA: Diagnosis not present

## 2014-09-21 DIAGNOSIS — Z886 Allergy status to analgesic agent status: Secondary | ICD-10-CM | POA: Diagnosis not present

## 2014-09-21 DIAGNOSIS — R5383 Other fatigue: Secondary | ICD-10-CM | POA: Diagnosis not present

## 2014-09-21 DIAGNOSIS — K219 Gastro-esophageal reflux disease without esophagitis: Secondary | ICD-10-CM | POA: Diagnosis not present

## 2014-09-21 DIAGNOSIS — R0602 Shortness of breath: Secondary | ICD-10-CM | POA: Diagnosis not present

## 2014-09-21 DIAGNOSIS — M170 Bilateral primary osteoarthritis of knee: Secondary | ICD-10-CM | POA: Diagnosis not present

## 2014-09-21 DIAGNOSIS — R0789 Other chest pain: Secondary | ICD-10-CM | POA: Diagnosis not present

## 2014-09-21 DIAGNOSIS — R079 Chest pain, unspecified: Secondary | ICD-10-CM | POA: Diagnosis not present

## 2014-09-22 DIAGNOSIS — K219 Gastro-esophageal reflux disease without esophagitis: Secondary | ICD-10-CM | POA: Diagnosis not present

## 2014-09-22 DIAGNOSIS — R0789 Other chest pain: Secondary | ICD-10-CM | POA: Diagnosis not present

## 2014-09-22 DIAGNOSIS — I10 Essential (primary) hypertension: Secondary | ICD-10-CM | POA: Diagnosis not present

## 2014-09-22 DIAGNOSIS — R079 Chest pain, unspecified: Secondary | ICD-10-CM | POA: Diagnosis not present

## 2014-09-22 DIAGNOSIS — F419 Anxiety disorder, unspecified: Secondary | ICD-10-CM | POA: Diagnosis not present

## 2014-09-24 DIAGNOSIS — K219 Gastro-esophageal reflux disease without esophagitis: Secondary | ICD-10-CM | POA: Diagnosis not present

## 2014-09-24 DIAGNOSIS — F411 Generalized anxiety disorder: Secondary | ICD-10-CM | POA: Diagnosis not present

## 2014-09-24 DIAGNOSIS — I10 Essential (primary) hypertension: Secondary | ICD-10-CM | POA: Diagnosis not present

## 2014-09-24 DIAGNOSIS — R079 Chest pain, unspecified: Secondary | ICD-10-CM | POA: Diagnosis not present

## 2014-09-24 DIAGNOSIS — M170 Bilateral primary osteoarthritis of knee: Secondary | ICD-10-CM | POA: Diagnosis not present

## 2014-09-26 DIAGNOSIS — R079 Chest pain, unspecified: Secondary | ICD-10-CM | POA: Diagnosis not present

## 2014-09-26 DIAGNOSIS — K219 Gastro-esophageal reflux disease without esophagitis: Secondary | ICD-10-CM | POA: Diagnosis not present

## 2014-09-26 DIAGNOSIS — I10 Essential (primary) hypertension: Secondary | ICD-10-CM | POA: Diagnosis not present

## 2014-09-26 DIAGNOSIS — M170 Bilateral primary osteoarthritis of knee: Secondary | ICD-10-CM | POA: Diagnosis not present

## 2014-09-26 DIAGNOSIS — F411 Generalized anxiety disorder: Secondary | ICD-10-CM | POA: Diagnosis not present

## 2014-09-27 DIAGNOSIS — F411 Generalized anxiety disorder: Secondary | ICD-10-CM | POA: Diagnosis not present

## 2014-09-27 DIAGNOSIS — M170 Bilateral primary osteoarthritis of knee: Secondary | ICD-10-CM | POA: Diagnosis not present

## 2014-09-27 DIAGNOSIS — K219 Gastro-esophageal reflux disease without esophagitis: Secondary | ICD-10-CM | POA: Diagnosis not present

## 2014-09-27 DIAGNOSIS — I10 Essential (primary) hypertension: Secondary | ICD-10-CM | POA: Diagnosis not present

## 2014-09-27 DIAGNOSIS — R079 Chest pain, unspecified: Secondary | ICD-10-CM | POA: Diagnosis not present

## 2014-09-28 DIAGNOSIS — M170 Bilateral primary osteoarthritis of knee: Secondary | ICD-10-CM | POA: Diagnosis not present

## 2014-09-28 DIAGNOSIS — I10 Essential (primary) hypertension: Secondary | ICD-10-CM | POA: Diagnosis not present

## 2014-09-28 DIAGNOSIS — F411 Generalized anxiety disorder: Secondary | ICD-10-CM | POA: Diagnosis not present

## 2014-09-28 DIAGNOSIS — R079 Chest pain, unspecified: Secondary | ICD-10-CM | POA: Diagnosis not present

## 2014-09-28 DIAGNOSIS — K219 Gastro-esophageal reflux disease without esophagitis: Secondary | ICD-10-CM | POA: Diagnosis not present

## 2014-09-30 DIAGNOSIS — F411 Generalized anxiety disorder: Secondary | ICD-10-CM | POA: Diagnosis not present

## 2014-09-30 DIAGNOSIS — I10 Essential (primary) hypertension: Secondary | ICD-10-CM | POA: Diagnosis not present

## 2014-09-30 DIAGNOSIS — R079 Chest pain, unspecified: Secondary | ICD-10-CM | POA: Diagnosis not present

## 2014-09-30 DIAGNOSIS — M170 Bilateral primary osteoarthritis of knee: Secondary | ICD-10-CM | POA: Diagnosis not present

## 2014-09-30 DIAGNOSIS — K219 Gastro-esophageal reflux disease without esophagitis: Secondary | ICD-10-CM | POA: Diagnosis not present

## 2014-10-03 DIAGNOSIS — R079 Chest pain, unspecified: Secondary | ICD-10-CM | POA: Diagnosis not present

## 2014-10-03 DIAGNOSIS — K219 Gastro-esophageal reflux disease without esophagitis: Secondary | ICD-10-CM | POA: Diagnosis not present

## 2014-10-03 DIAGNOSIS — F411 Generalized anxiety disorder: Secondary | ICD-10-CM | POA: Diagnosis not present

## 2014-10-03 DIAGNOSIS — I10 Essential (primary) hypertension: Secondary | ICD-10-CM | POA: Diagnosis not present

## 2014-10-03 DIAGNOSIS — M170 Bilateral primary osteoarthritis of knee: Secondary | ICD-10-CM | POA: Diagnosis not present

## 2014-10-04 DIAGNOSIS — F411 Generalized anxiety disorder: Secondary | ICD-10-CM | POA: Diagnosis not present

## 2014-10-04 DIAGNOSIS — I10 Essential (primary) hypertension: Secondary | ICD-10-CM | POA: Diagnosis not present

## 2014-10-04 DIAGNOSIS — K219 Gastro-esophageal reflux disease without esophagitis: Secondary | ICD-10-CM | POA: Diagnosis not present

## 2014-10-04 DIAGNOSIS — R079 Chest pain, unspecified: Secondary | ICD-10-CM | POA: Diagnosis not present

## 2014-10-04 DIAGNOSIS — M170 Bilateral primary osteoarthritis of knee: Secondary | ICD-10-CM | POA: Diagnosis not present

## 2014-10-06 ENCOUNTER — Encounter (HOSPITAL_COMMUNITY): Payer: Self-pay | Admitting: *Deleted

## 2014-10-06 ENCOUNTER — Emergency Department (HOSPITAL_COMMUNITY)
Admission: EM | Admit: 2014-10-06 | Discharge: 2014-10-06 | Disposition: A | Discharge status: Home / Self Care | Payer: Medicare Other | Attending: Emergency Medicine | Admitting: Emergency Medicine

## 2014-10-06 ENCOUNTER — Emergency Department (HOSPITAL_COMMUNITY): Payer: Medicare Other

## 2014-10-06 DIAGNOSIS — Z880 Allergy status to penicillin: Secondary | ICD-10-CM | POA: Insufficient documentation

## 2014-10-06 DIAGNOSIS — E669 Obesity, unspecified: Secondary | ICD-10-CM | POA: Diagnosis not present

## 2014-10-06 DIAGNOSIS — Z8719 Personal history of other diseases of the digestive system: Secondary | ICD-10-CM | POA: Diagnosis not present

## 2014-10-06 DIAGNOSIS — R410 Disorientation, unspecified: Secondary | ICD-10-CM | POA: Diagnosis not present

## 2014-10-06 DIAGNOSIS — Z7952 Long term (current) use of systemic steroids: Secondary | ICD-10-CM | POA: Diagnosis not present

## 2014-10-06 DIAGNOSIS — K219 Gastro-esophageal reflux disease without esophagitis: Secondary | ICD-10-CM | POA: Diagnosis not present

## 2014-10-06 DIAGNOSIS — R404 Transient alteration of awareness: Secondary | ICD-10-CM | POA: Diagnosis not present

## 2014-10-06 DIAGNOSIS — F4489 Other dissociative and conversion disorders: Secondary | ICD-10-CM | POA: Diagnosis not present

## 2014-10-06 DIAGNOSIS — Z79899 Other long term (current) drug therapy: Secondary | ICD-10-CM | POA: Insufficient documentation

## 2014-10-06 DIAGNOSIS — R4182 Altered mental status, unspecified: Secondary | ICD-10-CM | POA: Insufficient documentation

## 2014-10-06 DIAGNOSIS — F411 Generalized anxiety disorder: Secondary | ICD-10-CM | POA: Diagnosis not present

## 2014-10-06 DIAGNOSIS — I10 Essential (primary) hypertension: Secondary | ICD-10-CM | POA: Insufficient documentation

## 2014-10-06 DIAGNOSIS — M1990 Unspecified osteoarthritis, unspecified site: Secondary | ICD-10-CM | POA: Insufficient documentation

## 2014-10-06 DIAGNOSIS — M170 Bilateral primary osteoarthritis of knee: Secondary | ICD-10-CM | POA: Diagnosis not present

## 2014-10-06 DIAGNOSIS — R079 Chest pain, unspecified: Secondary | ICD-10-CM | POA: Diagnosis not present

## 2014-10-06 LAB — URINALYSIS, ROUTINE W REFLEX MICROSCOPIC
BILIRUBIN URINE: NEGATIVE
Glucose, UA: NEGATIVE mg/dL
Ketones, ur: NEGATIVE mg/dL
LEUKOCYTES UA: NEGATIVE
NITRITE: NEGATIVE
Protein, ur: NEGATIVE mg/dL
SPECIFIC GRAVITY, URINE: 1.025 (ref 1.005–1.030)
UROBILINOGEN UA: 0.2 mg/dL (ref 0.0–1.0)
pH: 6 (ref 5.0–8.0)

## 2014-10-06 LAB — BASIC METABOLIC PANEL
Anion gap: 8 (ref 5–15)
BUN: 13 mg/dL (ref 6–20)
CO2: 33 mmol/L — AB (ref 22–32)
Calcium: 8.8 mg/dL — ABNORMAL LOW (ref 8.9–10.3)
Chloride: 101 mmol/L (ref 101–111)
Creatinine, Ser: 0.91 mg/dL (ref 0.44–1.00)
GFR calc Af Amer: >60 mL/min (ref >60)
GFR, EST NON AFRICAN AMERICAN: 56 mL/min — AB (ref >60)
GLUCOSE: 133 mg/dL — AB (ref 65–99)
POTASSIUM: 3.9 mmol/L (ref 3.5–5.1)
Sodium: 142 mmol/L (ref 135–145)

## 2014-10-06 LAB — CBC WITH DIFFERENTIAL/PLATELET
BASOS PCT: 0 % (ref 0–1)
Basophils Absolute: 0 10*3/uL (ref 0.0–0.1)
EOS ABS: 0.1 10*3/uL (ref 0.0–0.7)
EOS PCT: 1 % (ref 0–5)
HCT: 43.9 % (ref 36.0–46.0)
Hemoglobin: 14.6 g/dL (ref 12.0–15.0)
Lymphocytes Relative: 25 % (ref 12–46)
Lymphs Abs: 2 10*3/uL (ref 0.7–4.0)
MCH: 31 pg (ref 26.0–34.0)
MCHC: 33.3 g/dL (ref 30.0–36.0)
MCV: 93.2 fL (ref 78.0–100.0)
Monocytes Absolute: 0.5 10*3/uL (ref 0.1–1.0)
Monocytes Relative: 6 % (ref 3–12)
NEUTROS PCT: 68 % (ref 43–77)
Neutro Abs: 5.6 10*3/uL (ref 1.7–7.7)
PLATELETS: 198 10*3/uL (ref 150–400)
RBC: 4.71 MIL/uL (ref 3.87–5.11)
RDW: 12.6 % (ref 11.5–15.5)
WBC: 8.1 10*3/uL (ref 4.0–10.5)

## 2014-10-06 LAB — URINE MICROSCOPIC-ADD ON

## 2014-10-06 NOTE — Discharge Instructions (Signed)
Contact your md tomorrow to discuss assisted living or nh placement,   Follow up with dr. Kelvin Cellar for swallowing problems

## 2014-10-06 NOTE — ED Provider Notes (Signed)
CSN: [id number]     Arrival date & time 10/06/14  1810 History   First MD Initiated Contact with Patient 10/06/14 1845     Chief Complaint  Patient presents with  . Altered Mental Status     (Consider location/radiation/quality/duration/timing/severity/associated sxs/prior Treatment) Patient is a 81 y.o. female presenting with altered mental status. The history is provided by a caregiver (the pt awoke today and was confused on what day it was.   she is back to normal now).  Altered Mental Status Presenting symptoms: no combativeness   Severity:  Mild Most recent episode:  Today Episode history:  Multiple Timing:  Intermittent Progression:  Resolved Chronicity:  Recurrent Context: not alcohol use   Associated symptoms: no abdominal pain, no hallucinations, no headaches, no rash and no seizures     Past Medical History  Diagnosis Date  . Hernia   . Arthritis   . Hypertension   . Vertigo   . Obesity    Past Surgical History  Procedure Laterality Date  . Ooperectomy    . Tonsillectomy    . Hernia repair    . Cholecystectomy    . Breast surgery     No family history on file. History  Substance Use Topics  . Smoking status: Never Smoker   . Smokeless tobacco: Not on file  . Alcohol Use: No   OB History    No data available     Review of Systems  Constitutional: Negative for appetite change and fatigue.  HENT: Negative for congestion, ear discharge and sinus pressure.   Eyes: Negative for discharge.  Respiratory: Negative for cough.   Cardiovascular: Negative for chest pain.  Gastrointestinal: Negative for abdominal pain and diarrhea.  Genitourinary: Negative for frequency and hematuria.  Musculoskeletal: Negative for back pain.  Skin: Negative for rash.  Neurological: Negative for seizures and headaches.  Psychiatric/Behavioral: Negative for hallucinations.      Allergies  Propoxyphene; Citalopram; Codeine; Levaquin; Oxycontin; Oxycontin; Aspirin; and  Penicillins  Home Medications   Prior to Admission medications   Medication Sig Start Date End Date Taking? Authorizing Provider  cholecalciferol (VITAMIN D) 1000 UNITS tablet Take 1,000 Units by mouth daily.   Yes Historical Provider, MD  furosemide (LASIX) 40 MG tablet TAKE 1 TABLET DAILY 03/25/14  Yes Ernestina Penna, MD  meclizine (ANTIVERT) 25 MG tablet TAKE (1) TABLET THREE TIMES DAILY AS NEEDED. Patient taking differently: TAKE (1) TABLET TWICE TIMES DAILY 12/17/13  Yes Ernestina Penna, MD  Multiple Vitamin (MULTIVITAMIN WITH MINERALS) TABS tablet Take 1 tablet by mouth daily.   Yes Historical Provider, MD  omeprazole (PRILOSEC) 20 MG capsule Take 20 mg by mouth daily. 09/28/14  Yes Historical Provider, MD  PARoxetine (PAXIL) 20 MG tablet TAKE 1 TABLET IN MORNING   Yes Deatra Canter, FNP  potassium chloride SA (K-DUR,KLOR-CON) 20 MEQ tablet TAKE 1 TABLET DAILY 03/11/14  Yes Ernestina Penna, MD  predniSONE (DELTASONE) 5 MG tablet Take 7.5 mg by mouth daily.  09/20/14  Yes Historical Provider, MD  vitamin B-12 (CYANOCOBALAMIN) 1000 MCG tablet Take 1,000 mcg by mouth daily.   Yes Historical Provider, MD   BP 140/75 mmHg  Pulse 75  Temp(Src) 98 F (36.7 C) (Oral)  Resp 16  Ht  (1.6 m)  Wt 278 lb (126.1 kg)  BMI 49.26 kg/m2  SpO2 96% Physical Exam  Constitutional: She is oriented to person, place, and time. She appears well-developed.  HENT:  Head: Normocephalic.  Eyes: Conjunctivae  and EOM are normal. No scleral icterus.  Neck: Neck supple. No thyromegaly present.  Cardiovascular: Normal rate and regular rhythm.  Exam reveals no gallop and no friction rub.   No murmur heard. Pulmonary/Chest: No stridor. She has no wheezes. She has no rales. She exhibits no tenderness.  Abdominal: She exhibits no distension. There is no tenderness. There is no rebound.  Musculoskeletal: Normal range of motion. She exhibits no edema.  Lymphadenopathy:    She has no cervical adenopathy.   Neurological: She is oriented to person, place, and time. She exhibits normal muscle tone. Coordination normal.  Skin: No rash noted. No erythema.  Psychiatric: She has a normal mood and affect. Her behavior is normal.    ED Course  Procedures (including critical care time) Labs Review Labs Reviewed  BASIC METABOLIC PANEL - Abnormal; Notable for the following:    CO2 33 (*)    Glucose, Bld 133 (*)    Calcium 8.8 (*)    GFR calc non Af Amer 56 (*)    All other components within normal limits  URINALYSIS, ROUTINE W REFLEX MICROSCOPIC (NOT AT [facility]) - Abnormal; Notable for the following:    Hgb urine dipstick SMALL (*)    All other components within normal limits  CBC WITH DIFFERENTIAL/PLATELET  URINE MICROSCOPIC-ADD ON    Imaging Review Ct Head Wo Contrast  10/06/2014   CLINICAL DATA:  Acute onset of confusion.  Initial encounter.  EXAM: CT HEAD WITHOUT CONTRAST  TECHNIQUE: Contiguous axial images were obtained from the base of the skull through the vertex without intravenous contrast.  COMPARISON:  CT of the head performed 08/08/2014  FINDINGS: There is no evidence of acute infarction, mass lesion, or intra- or extra-axial hemorrhage on CT.  Prominence of the ventricles and sulci reflects moderate cortical volume loss. Mild cerebellar atrophy is noted. Scattered periventricular and subcortical white matter change likely reflects small vessel ischemic microangiopathy.  The brainstem and fourth ventricle are within normal limits. The basal ganglia are unremarkable in appearance. The cerebral hemispheres demonstrate grossly normal gray-white differentiation. No mass effect or midline shift is seen.  There is no evidence of fracture; visualized osseous structures are unremarkable in appearance. The orbits are within normal limits. The paranasal sinuses and mastoid air cells are well-aerated. No significant soft tissue abnormalities are seen.  IMPRESSION: 1. No acute intracranial pathology seen on  CT. 2. Moderate cortical volume loss and scattered small vessel ischemic microangiopathy.   Electronically Signed   By: Roanna Raider M.D.   On: 10/06/2014 19:50     EKG Interpretation None      MDM   Final diagnoses:  Altered mental status, unspecified altered mental status type    Pt back to baseline.  Daughter asked about assisted living and I instructed her to speak to the pts family md tomorrow.  Also daughter states pt has had some problems swallowing.  She was referred to Paulina Fusi, MD 10/06/14 2056

## 2014-10-06 NOTE — ED Notes (Signed)
Pt comes in from home. EMS was called to residence for confusion. Pt fell asleep around 12:00pm and woke up at 5:00pm and was confused on what day it was when she woke up. Pt was told the date and time and is no longer confused. Pt is alert and oriented at this time. NAD noted.

## 2014-10-07 DIAGNOSIS — I10 Essential (primary) hypertension: Secondary | ICD-10-CM | POA: Diagnosis not present

## 2014-10-07 DIAGNOSIS — M170 Bilateral primary osteoarthritis of knee: Secondary | ICD-10-CM | POA: Diagnosis not present

## 2014-10-07 DIAGNOSIS — K219 Gastro-esophageal reflux disease without esophagitis: Secondary | ICD-10-CM | POA: Diagnosis not present

## 2014-10-07 DIAGNOSIS — R079 Chest pain, unspecified: Secondary | ICD-10-CM | POA: Diagnosis not present

## 2014-10-07 DIAGNOSIS — F411 Generalized anxiety disorder: Secondary | ICD-10-CM | POA: Diagnosis not present

## 2014-10-10 DIAGNOSIS — R079 Chest pain, unspecified: Secondary | ICD-10-CM | POA: Diagnosis not present

## 2014-10-10 DIAGNOSIS — K219 Gastro-esophageal reflux disease without esophagitis: Secondary | ICD-10-CM | POA: Diagnosis not present

## 2014-10-10 DIAGNOSIS — M170 Bilateral primary osteoarthritis of knee: Secondary | ICD-10-CM | POA: Diagnosis not present

## 2014-10-10 DIAGNOSIS — I10 Essential (primary) hypertension: Secondary | ICD-10-CM | POA: Diagnosis not present

## 2014-10-10 DIAGNOSIS — F411 Generalized anxiety disorder: Secondary | ICD-10-CM | POA: Diagnosis not present

## 2014-10-11 DIAGNOSIS — M170 Bilateral primary osteoarthritis of knee: Secondary | ICD-10-CM | POA: Diagnosis not present

## 2014-10-11 DIAGNOSIS — R079 Chest pain, unspecified: Secondary | ICD-10-CM | POA: Diagnosis not present

## 2014-10-11 DIAGNOSIS — F411 Generalized anxiety disorder: Secondary | ICD-10-CM | POA: Diagnosis not present

## 2014-10-11 DIAGNOSIS — I10 Essential (primary) hypertension: Secondary | ICD-10-CM | POA: Diagnosis not present

## 2014-10-11 DIAGNOSIS — K219 Gastro-esophageal reflux disease without esophagitis: Secondary | ICD-10-CM | POA: Diagnosis not present

## 2014-10-12 DIAGNOSIS — K219 Gastro-esophageal reflux disease without esophagitis: Secondary | ICD-10-CM | POA: Diagnosis not present

## 2014-10-12 DIAGNOSIS — R079 Chest pain, unspecified: Secondary | ICD-10-CM | POA: Diagnosis not present

## 2014-10-12 DIAGNOSIS — F411 Generalized anxiety disorder: Secondary | ICD-10-CM | POA: Diagnosis not present

## 2014-10-12 DIAGNOSIS — I10 Essential (primary) hypertension: Secondary | ICD-10-CM | POA: Diagnosis not present

## 2014-10-12 DIAGNOSIS — M170 Bilateral primary osteoarthritis of knee: Secondary | ICD-10-CM | POA: Diagnosis not present

## 2014-10-13 DIAGNOSIS — I5032 Chronic diastolic (congestive) heart failure: Secondary | ICD-10-CM | POA: Diagnosis not present

## 2014-10-13 DIAGNOSIS — E6609 Other obesity due to excess calories: Secondary | ICD-10-CM | POA: Diagnosis not present

## 2014-10-21 ENCOUNTER — Telehealth: Payer: Self-pay | Admitting: Physician Assistant

## 2014-10-21 NOTE — Telephone Encounter (Signed)
Complains of bilateral knee pain and cracking.  She would like an evaluation and possible cortisone injections. Advised that there aren't any available appointments today. Offered an appointment tomorrow. She is going to see if her son can bring her and call back to schedule.

## 2014-10-27 ENCOUNTER — Ambulatory Visit: Payer: Medicare Other | Admitting: Physician Assistant

## 2014-10-27 ENCOUNTER — Encounter: Payer: Self-pay | Admitting: Physician Assistant

## 2014-10-27 ENCOUNTER — Ambulatory Visit (INDEPENDENT_AMBULATORY_CARE_PROVIDER_SITE_OTHER): Payer: Medicare Other | Admitting: Physician Assistant

## 2014-10-27 VITALS — BP 132/82 | Temp 97.9°F

## 2014-10-27 DIAGNOSIS — K219 Gastro-esophageal reflux disease without esophagitis: Secondary | ICD-10-CM | POA: Diagnosis not present

## 2014-10-27 DIAGNOSIS — M25562 Pain in left knee: Secondary | ICD-10-CM | POA: Diagnosis not present

## 2014-10-27 DIAGNOSIS — M25561 Pain in right knee: Secondary | ICD-10-CM

## 2014-10-27 NOTE — Progress Notes (Addendum)
Subjective:     Patient ID: Megan Fisher, female   DOB: 11/29/1928, 81 y.o.   MRN: [id number]  HPI Pt here with 2 main complaints She stopped her Prednisone due to weight gain concerns Now with wheezing after eating She has a hx of GERD but has not been taking Prilosec Also with bilat knee pain that was better on Pred Pain is to the point she is in the wheelchair most of the time  Review of Systems  Constitutional: Positive for activity change and fatigue. Negative for fever and appetite change.  HENT: Positive for congestion.   Respiratory: Positive for choking and wheezing. Negative for chest tightness.   Cardiovascular: Negative.   Musculoskeletal: Positive for myalgias, joint swelling and arthralgias.       Objective:   Physical Exam  Constitutional: She appears well-developed and well-nourished.  HENT:  Mouth/Throat: Oropharynx is clear and moist.  Neck: Neck supple. No JVD present.  Cardiovascular: Normal rate, regular rhythm and normal heart sounds.   Pulmonary/Chest: Effort normal. No respiratory distress. She has wheezes. She has no rales.  Musculoskeletal:  Bilat knee with effusion + TTP of the knees bilat medially FROM of the knees with crepitus No calf TTP  Lymphadenopathy:    She has no cervical adenopathy.  Nursing note and vitals reviewed. Discussed going back on the Prednisone but pt does not want to do that She wanted injection Consent obtained Under sterile technique 1.5cc kenalog/1.5cc marcaine Injections were done to both knees Dressing placed    Assessment:     1. Left knee pain   2. Right knee pain   3. Gastroesophageal reflux disease, esophagitis presence not specified        Plan:     Pt will restart her Prilosec Informed GERD can cause her wheezing Also discussed she may need to restart the Prednisone F/U prn

## 2014-10-27 NOTE — Patient Instructions (Signed)
Gastroesophageal Reflux Disease, Adult Gastroesophageal reflux disease (GERD) happens when acid from your stomach flows up into the esophagus. When acid comes in contact with the esophagus, the acid causes soreness (inflammation) in the esophagus. Over time, GERD may create small holes (ulcers) in the lining of the esophagus. CAUSES   Increased body weight. This puts pressure on the stomach, making acid rise from the stomach into the esophagus.  Smoking. This increases acid production in the stomach.  Drinking alcohol. This causes decreased pressure in the lower esophageal sphincter (valve or ring of muscle between the esophagus and stomach), allowing acid from the stomach into the esophagus.  Late evening meals and a full stomach. This increases pressure and acid production in the stomach.  A malformed lower esophageal sphincter. Sometimes, no cause is found. SYMPTOMS   Burning pain in the lower part of the mid-chest behind the breastbone and in the mid-stomach area. This may occur twice a week or more often.  Trouble swallowing.  Sore throat.  Dry cough.  Asthma-like symptoms including chest tightness, shortness of breath, or wheezing. DIAGNOSIS  Your caregiver may be able to diagnose GERD based on your symptoms. In some cases, X-rays and other tests may be done to check for complications or to check the condition of your stomach and esophagus. TREATMENT  Your caregiver may recommend over-the-counter or prescription medicines to help decrease acid production. Ask your caregiver before starting or adding any new medicines.  HOME CARE INSTRUCTIONS   Change the factors that you can control. Ask your caregiver for guidance concerning weight loss, quitting smoking, and alcohol consumption.  Avoid foods and drinks that make your symptoms worse, such as:  Caffeine or alcoholic drinks.  Chocolate.  Peppermint or mint flavorings.  Garlic and onions.  Spicy foods.  Citrus fruits,  such as oranges, lemons, or limes.  Tomato-based foods such as sauce, chili, salsa, and pizza.  Fried and fatty foods.  Avoid lying down for the 3 hours prior to your bedtime or prior to taking a nap.  Eat small, frequent meals instead of large meals.  Wear loose-fitting clothing. Do not wear anything tight around your waist that causes pressure on your stomach.  Raise the head of your bed 6 to 8 inches with wood blocks to help you sleep. Extra pillows will not help.  Only take over-the-counter or prescription medicines for pain, discomfort, or fever as directed by your caregiver.  Do not take aspirin, ibuprofen, or other nonsteroidal anti-inflammatory drugs (NSAIDs). SEEK IMMEDIATE MEDICAL CARE IF:   You have pain in your arms, neck, jaw, teeth, or back.  Your pain increases or changes in intensity or duration.  You develop nausea, vomiting, or sweating (diaphoresis).  You develop shortness of breath, or you faint.  Your vomit is green, yellow, black, or looks like coffee grounds or blood.  Your stool is red, bloody, or black. These symptoms could be signs of other problems, such as heart disease, gastric bleeding, or esophageal bleeding. MAKE SURE YOU:   Understand these instructions.  Will watch your condition.  Will get help right away if you are not doing well or get worse. Document Released: 01/16/2005 Document Revised: 07/01/2011 Document Reviewed: 10/26/2010 Exit[employer] Patient Information 2015 [employer]. This information is not intended to replace advice given to you by your health care provider. Make sure you discuss any questions you have with your health care provider.  

## 2014-10-31 ENCOUNTER — Encounter: Payer: Self-pay | Admitting: Family Medicine

## 2014-10-31 ENCOUNTER — Ambulatory Visit (INDEPENDENT_AMBULATORY_CARE_PROVIDER_SITE_OTHER): Payer: Medicare Other | Admitting: Family Medicine

## 2014-10-31 VITALS — BP 128/70 | HR 78 | Temp 97.5°F

## 2014-10-31 DIAGNOSIS — K219 Gastro-esophageal reflux disease without esophagitis: Secondary | ICD-10-CM | POA: Diagnosis not present

## 2014-10-31 DIAGNOSIS — R300 Dysuria: Secondary | ICD-10-CM

## 2014-10-31 DIAGNOSIS — M170 Bilateral primary osteoarthritis of knee: Secondary | ICD-10-CM | POA: Diagnosis not present

## 2014-10-31 DIAGNOSIS — R079 Chest pain, unspecified: Secondary | ICD-10-CM | POA: Diagnosis not present

## 2014-10-31 DIAGNOSIS — F411 Generalized anxiety disorder: Secondary | ICD-10-CM | POA: Diagnosis not present

## 2014-10-31 DIAGNOSIS — J209 Acute bronchitis, unspecified: Secondary | ICD-10-CM

## 2014-10-31 DIAGNOSIS — I10 Essential (primary) hypertension: Secondary | ICD-10-CM | POA: Diagnosis not present

## 2014-10-31 LAB — POCT UA - MICROSCOPIC ONLY
CASTS, UR, LPF, POC: NEGATIVE
CRYSTALS, UR, HPF, POC: NEGATIVE
Mucus, UA: NEGATIVE
RBC, URINE, MICROSCOPIC: NEGATIVE
YEAST UA: NEGATIVE

## 2014-10-31 LAB — POCT URINALYSIS DIPSTICK
BILIRUBIN UA: NEGATIVE
Glucose, UA: NEGATIVE
Ketones, UA: NEGATIVE
NITRITE UA: NEGATIVE
SPEC GRAV UA: 1.01
Urobilinogen, UA: NEGATIVE
pH, UA: 6

## 2014-10-31 MED ORDER — CIPROFLOXACIN HCL 250 MG PO TABS: 250.0000 mg | ORAL_TABLET | Freq: Two times a day (BID) | ORAL | Status: DC

## 2014-10-31 NOTE — Progress Notes (Signed)
Subjective:  Patient ID: Megan Fisher, female    DOB: Apr 20, 1929  Age: 81 y.o. MRN: [id number]  CC: Urinary Tract Infection and URI   HPI ABEL RA presents for cough congestion and earache. She has had a dry cough. She has not had shortness of breath. She has not had any abdominal pain but she does have dysuria. She has a history of frequent urinary tract infections and currently has dysuria with each episode of urination. Additionally she is having frequency and urgency of urination. Of note is that she is wheelchair-bound and is having some trouble making it to the restroom. Patient is very concerned about possibility of multiple allergies. At first she states she is allergic to sulfa, tetracycline, Cipro in addition to the ones on her allergy list. Additionally she states that she does not tolerate Cephalosporins. Chart review shows that she has taken sulfa and Cipro approximately year and a half ago per Dr.Wong and Christell Constant. Both agents were noted to have had their course completed without any notation of a reaction.  History Nocole has a past medical history of Hernia; Arthritis; Hypertension; Vertigo; and Obesity.   She has past surgical history that includes ooperectomy; Tonsillectomy; Hernia repair; Cholecystectomy; and Breast surgery.   Her family history is not on file.She reports that she has never smoked. She does not have any smokeless tobacco history on file. She reports that she does not drink alcohol or use illicit drugs.  Outpatient Prescriptions Prior to Visit  Medication Sig Dispense Refill  . cholecalciferol (VITAMIN D) 1000 UNITS tablet Take 1,000 Units by mouth daily.    . furosemide (LASIX) 40 MG tablet TAKE 1 TABLET DAILY 30 tablet 0  . meclizine (ANTIVERT) 25 MG tablet TAKE (1) TABLET THREE TIMES DAILY AS NEEDED. (Patient taking differently: TAKE (1) TABLET TWICE TIMES DAILY) 60 tablet 1  . Multiple Vitamin (MULTIVITAMIN WITH MINERALS) TABS tablet Take 1 tablet by mouth  daily.    Marland Kitchen omeprazole (PRILOSEC) 20 MG capsule Take 20 mg by mouth daily.  0  . PARoxetine (PAXIL) 20 MG tablet TAKE 1 TABLET IN MORNING 30 tablet 11  . potassium chloride SA (K-DUR,KLOR-CON) 20 MEQ tablet TAKE 1 TABLET DAILY 30 tablet 0  . vitamin B-12 (CYANOCOBALAMIN) 1000 MCG tablet Take 1,000 mcg by mouth daily.     No facility-administered medications prior to visit.    ROS Review of Systems  Constitutional: Negative for fever, chills and diaphoresis.  HENT: Negative for congestion.   Eyes: Negative for visual disturbance.  Respiratory: Negative for cough and shortness of breath.   Cardiovascular: Negative for chest pain and palpitations.  Gastrointestinal: Negative for nausea, diarrhea and constipation.  Genitourinary: Positive for dysuria, urgency and frequency. Negative for hematuria, flank pain, decreased urine volume, menstrual problem and pelvic pain.  Musculoskeletal: Negative for joint swelling and arthralgias.  Skin: Negative for rash.  Neurological: Negative for dizziness and numbness.    Objective:  BP 128/70 mmHg  Pulse 78  Temp(Src) 97.5 F (36.4 C) (Oral)  Wt   BP Readings from Last 3 Encounters:  10/31/14 128/70  10/27/14 132/82  10/06/14 133/89    Wt Readings from Last 3 Encounters:  10/06/14 278 lb (126.1 kg)  09/27/13 259 lb 6.4 oz (117.663 kg)  05/10/13 253 lb (114.76 kg)     Physical Exam  Constitutional: She is oriented to person, place, and time. She appears well-developed and well-nourished. No distress.  HENT:  Head: Normocephalic and atraumatic.  Right Ear:  External ear normal.  Left Ear: External ear normal.  Nose: Nose normal.  Mouth/Throat: Oropharynx is clear and moist.  Eyes: Conjunctivae and EOM are normal. Pupils are equal, round, and reactive to light.  Neck: Normal range of motion. Neck supple.  Cardiovascular: Normal rate, regular rhythm and normal heart sounds.   No murmur heard. Pulmonary/Chest: Effort normal and breath  sounds normal. No respiratory distress. She has no wheezes. She has no rales.  Abdominal: Soft. Bowel sounds are normal. She exhibits no distension. There is no tenderness.  Musculoskeletal:  Wheelchair-bound  Neurological: She is alert and oriented to person, place, and time.  Skin: Skin is warm and dry.  Psychiatric: She has a normal mood and affect. Her behavior is normal. Judgment and thought content normal.    No results found for: HGBA1C  Lab Results  Component Value Date   WBC 8.1 10/06/2014   HGB 14.6 10/06/2014   HCT 43.9 10/06/2014   PLT 198 10/06/2014   GLUCOSE 133* 10/06/2014   ALT 14 05/10/2013   AST 20 05/10/2013   NA 142 10/06/2014   K 3.9 10/06/2014   CL 101 10/06/2014   CREATININE 0.91 10/06/2014   BUN 13 10/06/2014   CO2 33* 10/06/2014    Ct Head Wo Contrast  10/06/2014   CLINICAL DATA:  Acute onset of confusion.  Initial encounter.  EXAM: CT HEAD WITHOUT CONTRAST  TECHNIQUE: Contiguous axial images were obtained from the base of the skull through the vertex without intravenous contrast.  COMPARISON:  CT of the head performed 08/08/2014  FINDINGS: There is no evidence of acute infarction, mass lesion, or intra- or extra-axial hemorrhage on CT.  Prominence of the ventricles and sulci reflects moderate cortical volume loss. Mild cerebellar atrophy is noted. Scattered periventricular and subcortical white matter change likely reflects small vessel ischemic microangiopathy.  The brainstem and fourth ventricle are within normal limits. The basal ganglia are unremarkable in appearance. The cerebral hemispheres demonstrate grossly normal gray-white differentiation. No mass effect or midline shift is seen.  There is no evidence of fracture; visualized osseous structures are unremarkable in appearance. The orbits are within normal limits. The paranasal sinuses and mastoid air cells are well-aerated. No significant soft tissue abnormalities are seen.  IMPRESSION: 1. No acute  intracranial pathology seen on CT. 2. Moderate cortical volume loss and scattered small vessel ischemic microangiopathy.   Electronically Signed   By: Roanna RaiderJeffery  Chang M.D.   On: 10/06/2014 19:50    Assessment & Plan:   Wille CelesteJanie was seen today for urinary tract infection and uri.  Diagnoses and all orders for this visit:  Dysuria Orders: -     POCT urinalysis dipstick -     POCT UA - Microscopic Only -     Urine culture  Acute bronchitis, unspecified organism  Other orders -     ciprofloxacin (CIPRO) 250 MG tablet; Take 1 tablet (250 mg total) by mouth 2 (two) times daily.   I am having Ms. Dastrup start on ciprofloxacin. I am also having her maintain her PARoxetine, meclizine, potassium chloride SA, furosemide, omeprazole, cholecalciferol, vitamin B-12, and multivitamin with minerals.  Meds ordered this encounter  Medications  . ciprofloxacin (CIPRO) 250 MG tablet    Sig: Take 1 tablet (250 mg total) by mouth 2 (two) times daily.    Dispense:  20 tablet    Refill:  0     Follow-up: Return if symptoms worsen or fail to improve.  Mechele ClaudeWarren Addylynn Balin, M.D.

## 2014-11-01 DIAGNOSIS — M1990 Unspecified osteoarthritis, unspecified site: Secondary | ICD-10-CM | POA: Diagnosis not present

## 2014-11-01 DIAGNOSIS — I10 Essential (primary) hypertension: Secondary | ICD-10-CM | POA: Diagnosis not present

## 2014-11-01 DIAGNOSIS — Z79899 Other long term (current) drug therapy: Secondary | ICD-10-CM | POA: Diagnosis not present

## 2014-11-01 DIAGNOSIS — E669 Obesity, unspecified: Secondary | ICD-10-CM | POA: Diagnosis not present

## 2014-11-01 DIAGNOSIS — R062 Wheezing: Secondary | ICD-10-CM | POA: Diagnosis not present

## 2014-11-01 DIAGNOSIS — R110 Nausea: Secondary | ICD-10-CM | POA: Diagnosis not present

## 2014-11-01 DIAGNOSIS — Z881 Allergy status to other antibiotic agents status: Secondary | ICD-10-CM | POA: Diagnosis not present

## 2014-11-01 DIAGNOSIS — R05 Cough: Secondary | ICD-10-CM | POA: Diagnosis not present

## 2014-11-01 DIAGNOSIS — R42 Dizziness and giddiness: Secondary | ICD-10-CM | POA: Diagnosis not present

## 2014-11-01 DIAGNOSIS — F419 Anxiety disorder, unspecified: Secondary | ICD-10-CM | POA: Diagnosis not present

## 2014-11-01 DIAGNOSIS — N3000 Acute cystitis without hematuria: Secondary | ICD-10-CM | POA: Diagnosis not present

## 2014-11-02 LAB — URINE CULTURE

## 2014-11-03 ENCOUNTER — Other Ambulatory Visit: Payer: Self-pay | Admitting: Family Medicine

## 2014-11-03 ENCOUNTER — Telehealth: Payer: Self-pay | Admitting: Physician Assistant

## 2014-11-03 MED ORDER — SULFAMETHOXAZOLE-TRIMETHOPRIM 800-160 MG PO TABS: 1.0000 | ORAL_TABLET | Freq: Two times a day (BID) | ORAL | Status: DC

## 2014-11-03 NOTE — Telephone Encounter (Signed)
Patient on different antibiotic and doing better. She had been to hospital.

## 2014-11-16 ENCOUNTER — Telehealth: Payer: Self-pay | Admitting: Physician Assistant

## 2014-11-16 DIAGNOSIS — N3000 Acute cystitis without hematuria: Secondary | ICD-10-CM

## 2014-11-16 NOTE — Telephone Encounter (Signed)
Patients caretaker states that she has finished antibiotic that was given at [facility] for uti and she still has a foul odor to urine and is dark. Can they bring in a urine for [place] to check they have a really hard time getting her to the office.

## 2014-11-16 NOTE — Telephone Encounter (Signed)
Pt can bring sample- Orders in EPIC

## 2014-11-16 NOTE — Telephone Encounter (Signed)
lmovm that it is ok to bring in a urine specimen

## 2014-11-18 DIAGNOSIS — R279 Unspecified lack of coordination: Secondary | ICD-10-CM | POA: Diagnosis not present

## 2014-11-18 DIAGNOSIS — Z743 Need for continuous supervision: Secondary | ICD-10-CM | POA: Diagnosis not present

## 2014-11-20 DIAGNOSIS — K219 Gastro-esophageal reflux disease without esophagitis: Secondary | ICD-10-CM | POA: Diagnosis not present

## 2014-11-20 DIAGNOSIS — E785 Hyperlipidemia, unspecified: Secondary | ICD-10-CM | POA: Diagnosis not present

## 2014-11-20 DIAGNOSIS — I5020 Unspecified systolic (congestive) heart failure: Secondary | ICD-10-CM | POA: Diagnosis not present

## 2014-11-20 DIAGNOSIS — J449 Chronic obstructive pulmonary disease, unspecified: Secondary | ICD-10-CM | POA: Diagnosis not present

## 2014-11-29 DIAGNOSIS — R531 Weakness: Secondary | ICD-10-CM | POA: Diagnosis not present

## 2014-11-29 DIAGNOSIS — R279 Unspecified lack of coordination: Secondary | ICD-10-CM | POA: Diagnosis not present

## 2014-11-29 DIAGNOSIS — R2689 Other abnormalities of gait and mobility: Secondary | ICD-10-CM | POA: Diagnosis not present

## 2014-11-29 DIAGNOSIS — M6281 Muscle weakness (generalized): Secondary | ICD-10-CM | POA: Diagnosis not present

## 2014-11-29 DIAGNOSIS — R278 Other lack of coordination: Secondary | ICD-10-CM | POA: Diagnosis not present

## 2014-11-29 DIAGNOSIS — I5032 Chronic diastolic (congestive) heart failure: Secondary | ICD-10-CM | POA: Diagnosis not present

## 2014-11-30 DIAGNOSIS — R279 Unspecified lack of coordination: Secondary | ICD-10-CM | POA: Diagnosis not present

## 2014-11-30 DIAGNOSIS — R531 Weakness: Secondary | ICD-10-CM | POA: Diagnosis not present

## 2014-11-30 DIAGNOSIS — M6281 Muscle weakness (generalized): Secondary | ICD-10-CM | POA: Diagnosis not present

## 2014-11-30 DIAGNOSIS — R278 Other lack of coordination: Secondary | ICD-10-CM | POA: Diagnosis not present

## 2014-11-30 DIAGNOSIS — I5032 Chronic diastolic (congestive) heart failure: Secondary | ICD-10-CM | POA: Diagnosis not present

## 2014-11-30 DIAGNOSIS — R2689 Other abnormalities of gait and mobility: Secondary | ICD-10-CM | POA: Diagnosis not present

## 2014-12-01 DIAGNOSIS — R531 Weakness: Secondary | ICD-10-CM | POA: Diagnosis not present

## 2014-12-01 DIAGNOSIS — I5032 Chronic diastolic (congestive) heart failure: Secondary | ICD-10-CM | POA: Diagnosis not present

## 2014-12-01 DIAGNOSIS — R2689 Other abnormalities of gait and mobility: Secondary | ICD-10-CM | POA: Diagnosis not present

## 2014-12-01 DIAGNOSIS — M6281 Muscle weakness (generalized): Secondary | ICD-10-CM | POA: Diagnosis not present

## 2014-12-01 DIAGNOSIS — R279 Unspecified lack of coordination: Secondary | ICD-10-CM | POA: Diagnosis not present

## 2014-12-01 DIAGNOSIS — R278 Other lack of coordination: Secondary | ICD-10-CM | POA: Diagnosis not present

## 2014-12-02 DIAGNOSIS — I5032 Chronic diastolic (congestive) heart failure: Secondary | ICD-10-CM | POA: Diagnosis not present

## 2014-12-02 DIAGNOSIS — R279 Unspecified lack of coordination: Secondary | ICD-10-CM | POA: Diagnosis not present

## 2014-12-02 DIAGNOSIS — R531 Weakness: Secondary | ICD-10-CM | POA: Diagnosis not present

## 2014-12-02 DIAGNOSIS — M6281 Muscle weakness (generalized): Secondary | ICD-10-CM | POA: Diagnosis not present

## 2014-12-02 DIAGNOSIS — R278 Other lack of coordination: Secondary | ICD-10-CM | POA: Diagnosis not present

## 2014-12-02 DIAGNOSIS — R2689 Other abnormalities of gait and mobility: Secondary | ICD-10-CM | POA: Diagnosis not present

## 2014-12-05 DIAGNOSIS — I5032 Chronic diastolic (congestive) heart failure: Secondary | ICD-10-CM | POA: Diagnosis not present

## 2014-12-05 DIAGNOSIS — R531 Weakness: Secondary | ICD-10-CM | POA: Diagnosis not present

## 2014-12-05 DIAGNOSIS — R279 Unspecified lack of coordination: Secondary | ICD-10-CM | POA: Diagnosis not present

## 2014-12-05 DIAGNOSIS — R2689 Other abnormalities of gait and mobility: Secondary | ICD-10-CM | POA: Diagnosis not present

## 2014-12-05 DIAGNOSIS — M6281 Muscle weakness (generalized): Secondary | ICD-10-CM | POA: Diagnosis not present

## 2014-12-05 DIAGNOSIS — R278 Other lack of coordination: Secondary | ICD-10-CM | POA: Diagnosis not present

## 2014-12-06 DIAGNOSIS — R278 Other lack of coordination: Secondary | ICD-10-CM | POA: Diagnosis not present

## 2014-12-06 DIAGNOSIS — I5032 Chronic diastolic (congestive) heart failure: Secondary | ICD-10-CM | POA: Diagnosis not present

## 2014-12-06 DIAGNOSIS — R531 Weakness: Secondary | ICD-10-CM | POA: Diagnosis not present

## 2014-12-06 DIAGNOSIS — R279 Unspecified lack of coordination: Secondary | ICD-10-CM | POA: Diagnosis not present

## 2014-12-06 DIAGNOSIS — R2689 Other abnormalities of gait and mobility: Secondary | ICD-10-CM | POA: Diagnosis not present

## 2014-12-06 DIAGNOSIS — M6281 Muscle weakness (generalized): Secondary | ICD-10-CM | POA: Diagnosis not present

## 2014-12-07 DIAGNOSIS — R2689 Other abnormalities of gait and mobility: Secondary | ICD-10-CM | POA: Diagnosis not present

## 2014-12-07 DIAGNOSIS — M6281 Muscle weakness (generalized): Secondary | ICD-10-CM | POA: Diagnosis not present

## 2014-12-07 DIAGNOSIS — R279 Unspecified lack of coordination: Secondary | ICD-10-CM | POA: Diagnosis not present

## 2014-12-07 DIAGNOSIS — I5032 Chronic diastolic (congestive) heart failure: Secondary | ICD-10-CM | POA: Diagnosis not present

## 2014-12-07 DIAGNOSIS — R278 Other lack of coordination: Secondary | ICD-10-CM | POA: Diagnosis not present

## 2014-12-07 DIAGNOSIS — R531 Weakness: Secondary | ICD-10-CM | POA: Diagnosis not present

## 2014-12-08 DIAGNOSIS — I5032 Chronic diastolic (congestive) heart failure: Secondary | ICD-10-CM | POA: Diagnosis not present

## 2014-12-08 DIAGNOSIS — R278 Other lack of coordination: Secondary | ICD-10-CM | POA: Diagnosis not present

## 2014-12-08 DIAGNOSIS — M6281 Muscle weakness (generalized): Secondary | ICD-10-CM | POA: Diagnosis not present

## 2014-12-08 DIAGNOSIS — R531 Weakness: Secondary | ICD-10-CM | POA: Diagnosis not present

## 2014-12-08 DIAGNOSIS — R279 Unspecified lack of coordination: Secondary | ICD-10-CM | POA: Diagnosis not present

## 2014-12-08 DIAGNOSIS — R2689 Other abnormalities of gait and mobility: Secondary | ICD-10-CM | POA: Diagnosis not present

## 2014-12-09 DIAGNOSIS — I5032 Chronic diastolic (congestive) heart failure: Secondary | ICD-10-CM | POA: Diagnosis not present

## 2014-12-09 DIAGNOSIS — R531 Weakness: Secondary | ICD-10-CM | POA: Diagnosis not present

## 2014-12-09 DIAGNOSIS — R2689 Other abnormalities of gait and mobility: Secondary | ICD-10-CM | POA: Diagnosis not present

## 2014-12-09 DIAGNOSIS — M6281 Muscle weakness (generalized): Secondary | ICD-10-CM | POA: Diagnosis not present

## 2014-12-09 DIAGNOSIS — R278 Other lack of coordination: Secondary | ICD-10-CM | POA: Diagnosis not present

## 2014-12-09 DIAGNOSIS — R279 Unspecified lack of coordination: Secondary | ICD-10-CM | POA: Diagnosis not present

## 2014-12-10 DIAGNOSIS — N390 Urinary tract infection, site not specified: Secondary | ICD-10-CM | POA: Diagnosis not present

## 2014-12-10 DIAGNOSIS — Z87440 Personal history of urinary (tract) infections: Secondary | ICD-10-CM | POA: Diagnosis not present

## 2014-12-12 DIAGNOSIS — R531 Weakness: Secondary | ICD-10-CM | POA: Diagnosis not present

## 2014-12-12 DIAGNOSIS — R2689 Other abnormalities of gait and mobility: Secondary | ICD-10-CM | POA: Diagnosis not present

## 2014-12-12 DIAGNOSIS — M6281 Muscle weakness (generalized): Secondary | ICD-10-CM | POA: Diagnosis not present

## 2014-12-12 DIAGNOSIS — I5032 Chronic diastolic (congestive) heart failure: Secondary | ICD-10-CM | POA: Diagnosis not present

## 2014-12-12 DIAGNOSIS — R278 Other lack of coordination: Secondary | ICD-10-CM | POA: Diagnosis not present

## 2014-12-12 DIAGNOSIS — R279 Unspecified lack of coordination: Secondary | ICD-10-CM | POA: Diagnosis not present

## 2014-12-13 DIAGNOSIS — I5032 Chronic diastolic (congestive) heart failure: Secondary | ICD-10-CM | POA: Diagnosis not present

## 2014-12-13 DIAGNOSIS — M6281 Muscle weakness (generalized): Secondary | ICD-10-CM | POA: Diagnosis not present

## 2014-12-13 DIAGNOSIS — R2689 Other abnormalities of gait and mobility: Secondary | ICD-10-CM | POA: Diagnosis not present

## 2014-12-13 DIAGNOSIS — R279 Unspecified lack of coordination: Secondary | ICD-10-CM | POA: Diagnosis not present

## 2014-12-13 DIAGNOSIS — R278 Other lack of coordination: Secondary | ICD-10-CM | POA: Diagnosis not present

## 2014-12-13 DIAGNOSIS — R531 Weakness: Secondary | ICD-10-CM | POA: Diagnosis not present

## 2014-12-14 DIAGNOSIS — M6281 Muscle weakness (generalized): Secondary | ICD-10-CM | POA: Diagnosis not present

## 2014-12-14 DIAGNOSIS — R279 Unspecified lack of coordination: Secondary | ICD-10-CM | POA: Diagnosis not present

## 2014-12-14 DIAGNOSIS — R2689 Other abnormalities of gait and mobility: Secondary | ICD-10-CM | POA: Diagnosis not present

## 2014-12-14 DIAGNOSIS — R531 Weakness: Secondary | ICD-10-CM | POA: Diagnosis not present

## 2014-12-14 DIAGNOSIS — I5032 Chronic diastolic (congestive) heart failure: Secondary | ICD-10-CM | POA: Diagnosis not present

## 2014-12-14 DIAGNOSIS — R278 Other lack of coordination: Secondary | ICD-10-CM | POA: Diagnosis not present

## 2014-12-15 DIAGNOSIS — R2689 Other abnormalities of gait and mobility: Secondary | ICD-10-CM | POA: Diagnosis not present

## 2014-12-15 DIAGNOSIS — M6281 Muscle weakness (generalized): Secondary | ICD-10-CM | POA: Diagnosis not present

## 2014-12-15 DIAGNOSIS — I5032 Chronic diastolic (congestive) heart failure: Secondary | ICD-10-CM | POA: Diagnosis not present

## 2014-12-15 DIAGNOSIS — R278 Other lack of coordination: Secondary | ICD-10-CM | POA: Diagnosis not present

## 2014-12-15 DIAGNOSIS — R279 Unspecified lack of coordination: Secondary | ICD-10-CM | POA: Diagnosis not present

## 2014-12-15 DIAGNOSIS — R531 Weakness: Secondary | ICD-10-CM | POA: Diagnosis not present

## 2014-12-16 DIAGNOSIS — R278 Other lack of coordination: Secondary | ICD-10-CM | POA: Diagnosis not present

## 2014-12-16 DIAGNOSIS — R531 Weakness: Secondary | ICD-10-CM | POA: Diagnosis not present

## 2014-12-16 DIAGNOSIS — R279 Unspecified lack of coordination: Secondary | ICD-10-CM | POA: Diagnosis not present

## 2014-12-16 DIAGNOSIS — I5032 Chronic diastolic (congestive) heart failure: Secondary | ICD-10-CM | POA: Diagnosis not present

## 2014-12-16 DIAGNOSIS — M6281 Muscle weakness (generalized): Secondary | ICD-10-CM | POA: Diagnosis not present

## 2014-12-16 DIAGNOSIS — R2689 Other abnormalities of gait and mobility: Secondary | ICD-10-CM | POA: Diagnosis not present

## 2014-12-18 DIAGNOSIS — I5032 Chronic diastolic (congestive) heart failure: Secondary | ICD-10-CM | POA: Diagnosis not present

## 2014-12-18 DIAGNOSIS — R279 Unspecified lack of coordination: Secondary | ICD-10-CM | POA: Diagnosis not present

## 2014-12-18 DIAGNOSIS — R278 Other lack of coordination: Secondary | ICD-10-CM | POA: Diagnosis not present

## 2014-12-18 DIAGNOSIS — M6281 Muscle weakness (generalized): Secondary | ICD-10-CM | POA: Diagnosis not present

## 2014-12-18 DIAGNOSIS — R2689 Other abnormalities of gait and mobility: Secondary | ICD-10-CM | POA: Diagnosis not present

## 2014-12-18 DIAGNOSIS — R531 Weakness: Secondary | ICD-10-CM | POA: Diagnosis not present

## 2014-12-19 DIAGNOSIS — M6281 Muscle weakness (generalized): Secondary | ICD-10-CM | POA: Diagnosis not present

## 2014-12-19 DIAGNOSIS — I5032 Chronic diastolic (congestive) heart failure: Secondary | ICD-10-CM | POA: Diagnosis not present

## 2014-12-19 DIAGNOSIS — R531 Weakness: Secondary | ICD-10-CM | POA: Diagnosis not present

## 2014-12-19 DIAGNOSIS — R279 Unspecified lack of coordination: Secondary | ICD-10-CM | POA: Diagnosis not present

## 2014-12-19 DIAGNOSIS — R278 Other lack of coordination: Secondary | ICD-10-CM | POA: Diagnosis not present

## 2014-12-19 DIAGNOSIS — R2689 Other abnormalities of gait and mobility: Secondary | ICD-10-CM | POA: Diagnosis not present

## 2014-12-20 DIAGNOSIS — R279 Unspecified lack of coordination: Secondary | ICD-10-CM | POA: Diagnosis not present

## 2014-12-20 DIAGNOSIS — R278 Other lack of coordination: Secondary | ICD-10-CM | POA: Diagnosis not present

## 2014-12-20 DIAGNOSIS — M6281 Muscle weakness (generalized): Secondary | ICD-10-CM | POA: Diagnosis not present

## 2014-12-20 DIAGNOSIS — R2689 Other abnormalities of gait and mobility: Secondary | ICD-10-CM | POA: Diagnosis not present

## 2014-12-20 DIAGNOSIS — I5032 Chronic diastolic (congestive) heart failure: Secondary | ICD-10-CM | POA: Diagnosis not present

## 2014-12-20 DIAGNOSIS — R531 Weakness: Secondary | ICD-10-CM | POA: Diagnosis not present

## 2014-12-21 DIAGNOSIS — R531 Weakness: Secondary | ICD-10-CM | POA: Diagnosis not present

## 2014-12-21 DIAGNOSIS — I5032 Chronic diastolic (congestive) heart failure: Secondary | ICD-10-CM | POA: Diagnosis not present

## 2014-12-21 DIAGNOSIS — M6281 Muscle weakness (generalized): Secondary | ICD-10-CM | POA: Diagnosis not present

## 2014-12-21 DIAGNOSIS — R279 Unspecified lack of coordination: Secondary | ICD-10-CM | POA: Diagnosis not present

## 2014-12-21 DIAGNOSIS — R278 Other lack of coordination: Secondary | ICD-10-CM | POA: Diagnosis not present

## 2014-12-21 DIAGNOSIS — R2689 Other abnormalities of gait and mobility: Secondary | ICD-10-CM | POA: Diagnosis not present

## 2014-12-22 DIAGNOSIS — R2689 Other abnormalities of gait and mobility: Secondary | ICD-10-CM | POA: Diagnosis not present

## 2014-12-22 DIAGNOSIS — R278 Other lack of coordination: Secondary | ICD-10-CM | POA: Diagnosis not present

## 2014-12-22 DIAGNOSIS — I5032 Chronic diastolic (congestive) heart failure: Secondary | ICD-10-CM | POA: Diagnosis not present

## 2014-12-22 DIAGNOSIS — R531 Weakness: Secondary | ICD-10-CM | POA: Diagnosis not present

## 2014-12-22 DIAGNOSIS — R279 Unspecified lack of coordination: Secondary | ICD-10-CM | POA: Diagnosis not present

## 2014-12-22 DIAGNOSIS — M6281 Muscle weakness (generalized): Secondary | ICD-10-CM | POA: Diagnosis not present

## 2014-12-23 DIAGNOSIS — R531 Weakness: Secondary | ICD-10-CM | POA: Diagnosis not present

## 2014-12-23 DIAGNOSIS — R2689 Other abnormalities of gait and mobility: Secondary | ICD-10-CM | POA: Diagnosis not present

## 2014-12-23 DIAGNOSIS — M6281 Muscle weakness (generalized): Secondary | ICD-10-CM | POA: Diagnosis not present

## 2014-12-23 DIAGNOSIS — I5032 Chronic diastolic (congestive) heart failure: Secondary | ICD-10-CM | POA: Diagnosis not present

## 2014-12-23 DIAGNOSIS — R279 Unspecified lack of coordination: Secondary | ICD-10-CM | POA: Diagnosis not present

## 2014-12-23 DIAGNOSIS — R278 Other lack of coordination: Secondary | ICD-10-CM | POA: Diagnosis not present

## 2014-12-26 DIAGNOSIS — I5032 Chronic diastolic (congestive) heart failure: Secondary | ICD-10-CM | POA: Diagnosis not present

## 2014-12-26 DIAGNOSIS — R279 Unspecified lack of coordination: Secondary | ICD-10-CM | POA: Diagnosis not present

## 2014-12-26 DIAGNOSIS — R531 Weakness: Secondary | ICD-10-CM | POA: Diagnosis not present

## 2014-12-26 DIAGNOSIS — R278 Other lack of coordination: Secondary | ICD-10-CM | POA: Diagnosis not present

## 2014-12-26 DIAGNOSIS — M6281 Muscle weakness (generalized): Secondary | ICD-10-CM | POA: Diagnosis not present

## 2014-12-26 DIAGNOSIS — R2689 Other abnormalities of gait and mobility: Secondary | ICD-10-CM | POA: Diagnosis not present

## 2014-12-27 DIAGNOSIS — R279 Unspecified lack of coordination: Secondary | ICD-10-CM | POA: Diagnosis not present

## 2014-12-27 DIAGNOSIS — R278 Other lack of coordination: Secondary | ICD-10-CM | POA: Diagnosis not present

## 2014-12-27 DIAGNOSIS — I5032 Chronic diastolic (congestive) heart failure: Secondary | ICD-10-CM | POA: Diagnosis not present

## 2014-12-27 DIAGNOSIS — M6281 Muscle weakness (generalized): Secondary | ICD-10-CM | POA: Diagnosis not present

## 2014-12-27 DIAGNOSIS — R2689 Other abnormalities of gait and mobility: Secondary | ICD-10-CM | POA: Diagnosis not present

## 2014-12-27 DIAGNOSIS — R531 Weakness: Secondary | ICD-10-CM | POA: Diagnosis not present

## 2014-12-28 DIAGNOSIS — R278 Other lack of coordination: Secondary | ICD-10-CM | POA: Diagnosis not present

## 2014-12-28 DIAGNOSIS — R2689 Other abnormalities of gait and mobility: Secondary | ICD-10-CM | POA: Diagnosis not present

## 2014-12-28 DIAGNOSIS — R531 Weakness: Secondary | ICD-10-CM | POA: Diagnosis not present

## 2014-12-28 DIAGNOSIS — I5032 Chronic diastolic (congestive) heart failure: Secondary | ICD-10-CM | POA: Diagnosis not present

## 2014-12-28 DIAGNOSIS — R279 Unspecified lack of coordination: Secondary | ICD-10-CM | POA: Diagnosis not present

## 2014-12-28 DIAGNOSIS — M6281 Muscle weakness (generalized): Secondary | ICD-10-CM | POA: Diagnosis not present

## 2014-12-29 DIAGNOSIS — M6281 Muscle weakness (generalized): Secondary | ICD-10-CM | POA: Diagnosis not present

## 2014-12-29 DIAGNOSIS — R278 Other lack of coordination: Secondary | ICD-10-CM | POA: Diagnosis not present

## 2014-12-29 DIAGNOSIS — R279 Unspecified lack of coordination: Secondary | ICD-10-CM | POA: Diagnosis not present

## 2014-12-29 DIAGNOSIS — R531 Weakness: Secondary | ICD-10-CM | POA: Diagnosis not present

## 2014-12-29 DIAGNOSIS — I5032 Chronic diastolic (congestive) heart failure: Secondary | ICD-10-CM | POA: Diagnosis not present

## 2014-12-29 DIAGNOSIS — R2689 Other abnormalities of gait and mobility: Secondary | ICD-10-CM | POA: Diagnosis not present

## 2014-12-30 DIAGNOSIS — R2689 Other abnormalities of gait and mobility: Secondary | ICD-10-CM | POA: Diagnosis not present

## 2014-12-30 DIAGNOSIS — R531 Weakness: Secondary | ICD-10-CM | POA: Diagnosis not present

## 2014-12-30 DIAGNOSIS — I5032 Chronic diastolic (congestive) heart failure: Secondary | ICD-10-CM | POA: Diagnosis not present

## 2014-12-30 DIAGNOSIS — M6281 Muscle weakness (generalized): Secondary | ICD-10-CM | POA: Diagnosis not present

## 2014-12-30 DIAGNOSIS — R278 Other lack of coordination: Secondary | ICD-10-CM | POA: Diagnosis not present

## 2014-12-30 DIAGNOSIS — R279 Unspecified lack of coordination: Secondary | ICD-10-CM | POA: Diagnosis not present

## 2014-12-31 DIAGNOSIS — R279 Unspecified lack of coordination: Secondary | ICD-10-CM | POA: Diagnosis not present

## 2014-12-31 DIAGNOSIS — R278 Other lack of coordination: Secondary | ICD-10-CM | POA: Diagnosis not present

## 2014-12-31 DIAGNOSIS — R531 Weakness: Secondary | ICD-10-CM | POA: Diagnosis not present

## 2014-12-31 DIAGNOSIS — M6281 Muscle weakness (generalized): Secondary | ICD-10-CM | POA: Diagnosis not present

## 2014-12-31 DIAGNOSIS — I5032 Chronic diastolic (congestive) heart failure: Secondary | ICD-10-CM | POA: Diagnosis not present

## 2014-12-31 DIAGNOSIS — R2689 Other abnormalities of gait and mobility: Secondary | ICD-10-CM | POA: Diagnosis not present

## 2015-01-02 DIAGNOSIS — R531 Weakness: Secondary | ICD-10-CM | POA: Diagnosis not present

## 2015-01-02 DIAGNOSIS — R2689 Other abnormalities of gait and mobility: Secondary | ICD-10-CM | POA: Diagnosis not present

## 2015-01-02 DIAGNOSIS — R278 Other lack of coordination: Secondary | ICD-10-CM | POA: Diagnosis not present

## 2015-01-02 DIAGNOSIS — R109 Unspecified abdominal pain: Secondary | ICD-10-CM | POA: Diagnosis not present

## 2015-01-02 DIAGNOSIS — R279 Unspecified lack of coordination: Secondary | ICD-10-CM | POA: Diagnosis not present

## 2015-01-02 DIAGNOSIS — I5032 Chronic diastolic (congestive) heart failure: Secondary | ICD-10-CM | POA: Diagnosis not present

## 2015-01-02 DIAGNOSIS — M6281 Muscle weakness (generalized): Secondary | ICD-10-CM | POA: Diagnosis not present

## 2015-01-03 DIAGNOSIS — M6281 Muscle weakness (generalized): Secondary | ICD-10-CM | POA: Diagnosis not present

## 2015-01-03 DIAGNOSIS — I5032 Chronic diastolic (congestive) heart failure: Secondary | ICD-10-CM | POA: Diagnosis not present

## 2015-01-03 DIAGNOSIS — R278 Other lack of coordination: Secondary | ICD-10-CM | POA: Diagnosis not present

## 2015-01-03 DIAGNOSIS — R531 Weakness: Secondary | ICD-10-CM | POA: Diagnosis not present

## 2015-01-03 DIAGNOSIS — R279 Unspecified lack of coordination: Secondary | ICD-10-CM | POA: Diagnosis not present

## 2015-01-03 DIAGNOSIS — R2689 Other abnormalities of gait and mobility: Secondary | ICD-10-CM | POA: Diagnosis not present

## 2015-01-04 DIAGNOSIS — F331 Major depressive disorder, recurrent, moderate: Secondary | ICD-10-CM | POA: Diagnosis not present

## 2015-01-04 DIAGNOSIS — R2689 Other abnormalities of gait and mobility: Secondary | ICD-10-CM | POA: Diagnosis not present

## 2015-01-04 DIAGNOSIS — M6281 Muscle weakness (generalized): Secondary | ICD-10-CM | POA: Diagnosis not present

## 2015-01-04 DIAGNOSIS — R278 Other lack of coordination: Secondary | ICD-10-CM | POA: Diagnosis not present

## 2015-01-04 DIAGNOSIS — I5032 Chronic diastolic (congestive) heart failure: Secondary | ICD-10-CM | POA: Diagnosis not present

## 2015-01-04 DIAGNOSIS — R279 Unspecified lack of coordination: Secondary | ICD-10-CM | POA: Diagnosis not present

## 2015-01-04 DIAGNOSIS — R531 Weakness: Secondary | ICD-10-CM | POA: Diagnosis not present

## 2015-01-05 DIAGNOSIS — R531 Weakness: Secondary | ICD-10-CM | POA: Diagnosis not present

## 2015-01-05 DIAGNOSIS — I5032 Chronic diastolic (congestive) heart failure: Secondary | ICD-10-CM | POA: Diagnosis not present

## 2015-01-05 DIAGNOSIS — R2689 Other abnormalities of gait and mobility: Secondary | ICD-10-CM | POA: Diagnosis not present

## 2015-01-05 DIAGNOSIS — R279 Unspecified lack of coordination: Secondary | ICD-10-CM | POA: Diagnosis not present

## 2015-01-05 DIAGNOSIS — R278 Other lack of coordination: Secondary | ICD-10-CM | POA: Diagnosis not present

## 2015-01-05 DIAGNOSIS — M6281 Muscle weakness (generalized): Secondary | ICD-10-CM | POA: Diagnosis not present

## 2015-01-06 DIAGNOSIS — I5032 Chronic diastolic (congestive) heart failure: Secondary | ICD-10-CM | POA: Diagnosis not present

## 2015-01-06 DIAGNOSIS — M6281 Muscle weakness (generalized): Secondary | ICD-10-CM | POA: Diagnosis not present

## 2015-01-06 DIAGNOSIS — R531 Weakness: Secondary | ICD-10-CM | POA: Diagnosis not present

## 2015-01-06 DIAGNOSIS — R2689 Other abnormalities of gait and mobility: Secondary | ICD-10-CM | POA: Diagnosis not present

## 2015-01-06 DIAGNOSIS — R279 Unspecified lack of coordination: Secondary | ICD-10-CM | POA: Diagnosis not present

## 2015-01-06 DIAGNOSIS — R278 Other lack of coordination: Secondary | ICD-10-CM | POA: Diagnosis not present

## 2015-01-09 DIAGNOSIS — M6281 Muscle weakness (generalized): Secondary | ICD-10-CM | POA: Diagnosis not present

## 2015-01-09 DIAGNOSIS — I5032 Chronic diastolic (congestive) heart failure: Secondary | ICD-10-CM | POA: Diagnosis not present

## 2015-01-09 DIAGNOSIS — R278 Other lack of coordination: Secondary | ICD-10-CM | POA: Diagnosis not present

## 2015-01-09 DIAGNOSIS — R2689 Other abnormalities of gait and mobility: Secondary | ICD-10-CM | POA: Diagnosis not present

## 2015-01-09 DIAGNOSIS — R279 Unspecified lack of coordination: Secondary | ICD-10-CM | POA: Diagnosis not present

## 2015-01-09 DIAGNOSIS — R531 Weakness: Secondary | ICD-10-CM | POA: Diagnosis not present

## 2015-01-10 DIAGNOSIS — M6281 Muscle weakness (generalized): Secondary | ICD-10-CM | POA: Diagnosis not present

## 2015-01-10 DIAGNOSIS — F331 Major depressive disorder, recurrent, moderate: Secondary | ICD-10-CM | POA: Diagnosis not present

## 2015-01-10 DIAGNOSIS — R531 Weakness: Secondary | ICD-10-CM | POA: Diagnosis not present

## 2015-01-10 DIAGNOSIS — R2689 Other abnormalities of gait and mobility: Secondary | ICD-10-CM | POA: Diagnosis not present

## 2015-01-10 DIAGNOSIS — R278 Other lack of coordination: Secondary | ICD-10-CM | POA: Diagnosis not present

## 2015-01-10 DIAGNOSIS — I5032 Chronic diastolic (congestive) heart failure: Secondary | ICD-10-CM | POA: Diagnosis not present

## 2015-01-10 DIAGNOSIS — R279 Unspecified lack of coordination: Secondary | ICD-10-CM | POA: Diagnosis not present

## 2015-01-11 DIAGNOSIS — R279 Unspecified lack of coordination: Secondary | ICD-10-CM | POA: Diagnosis not present

## 2015-01-11 DIAGNOSIS — I5032 Chronic diastolic (congestive) heart failure: Secondary | ICD-10-CM | POA: Diagnosis not present

## 2015-01-11 DIAGNOSIS — R2689 Other abnormalities of gait and mobility: Secondary | ICD-10-CM | POA: Diagnosis not present

## 2015-01-11 DIAGNOSIS — R531 Weakness: Secondary | ICD-10-CM | POA: Diagnosis not present

## 2015-01-11 DIAGNOSIS — M6281 Muscle weakness (generalized): Secondary | ICD-10-CM | POA: Diagnosis not present

## 2015-01-11 DIAGNOSIS — R278 Other lack of coordination: Secondary | ICD-10-CM | POA: Diagnosis not present

## 2015-01-12 DIAGNOSIS — R2689 Other abnormalities of gait and mobility: Secondary | ICD-10-CM | POA: Diagnosis not present

## 2015-01-12 DIAGNOSIS — F331 Major depressive disorder, recurrent, moderate: Secondary | ICD-10-CM | POA: Diagnosis not present

## 2015-01-12 DIAGNOSIS — R531 Weakness: Secondary | ICD-10-CM | POA: Diagnosis not present

## 2015-01-12 DIAGNOSIS — R279 Unspecified lack of coordination: Secondary | ICD-10-CM | POA: Diagnosis not present

## 2015-01-12 DIAGNOSIS — M6281 Muscle weakness (generalized): Secondary | ICD-10-CM | POA: Diagnosis not present

## 2015-01-12 DIAGNOSIS — R278 Other lack of coordination: Secondary | ICD-10-CM | POA: Diagnosis not present

## 2015-01-12 DIAGNOSIS — I5032 Chronic diastolic (congestive) heart failure: Secondary | ICD-10-CM | POA: Diagnosis not present

## 2015-01-13 DIAGNOSIS — R531 Weakness: Secondary | ICD-10-CM | POA: Diagnosis not present

## 2015-01-13 DIAGNOSIS — R278 Other lack of coordination: Secondary | ICD-10-CM | POA: Diagnosis not present

## 2015-01-13 DIAGNOSIS — M6281 Muscle weakness (generalized): Secondary | ICD-10-CM | POA: Diagnosis not present

## 2015-01-13 DIAGNOSIS — R279 Unspecified lack of coordination: Secondary | ICD-10-CM | POA: Diagnosis not present

## 2015-01-13 DIAGNOSIS — I5032 Chronic diastolic (congestive) heart failure: Secondary | ICD-10-CM | POA: Diagnosis not present

## 2015-01-13 DIAGNOSIS — R2689 Other abnormalities of gait and mobility: Secondary | ICD-10-CM | POA: Diagnosis not present

## 2015-01-16 DIAGNOSIS — R2689 Other abnormalities of gait and mobility: Secondary | ICD-10-CM | POA: Diagnosis not present

## 2015-01-16 DIAGNOSIS — R278 Other lack of coordination: Secondary | ICD-10-CM | POA: Diagnosis not present

## 2015-01-16 DIAGNOSIS — R279 Unspecified lack of coordination: Secondary | ICD-10-CM | POA: Diagnosis not present

## 2015-01-16 DIAGNOSIS — R531 Weakness: Secondary | ICD-10-CM | POA: Diagnosis not present

## 2015-01-16 DIAGNOSIS — M6281 Muscle weakness (generalized): Secondary | ICD-10-CM | POA: Diagnosis not present

## 2015-01-16 DIAGNOSIS — I5032 Chronic diastolic (congestive) heart failure: Secondary | ICD-10-CM | POA: Diagnosis not present

## 2015-01-17 DIAGNOSIS — R279 Unspecified lack of coordination: Secondary | ICD-10-CM | POA: Diagnosis not present

## 2015-01-17 DIAGNOSIS — M6281 Muscle weakness (generalized): Secondary | ICD-10-CM | POA: Diagnosis not present

## 2015-01-17 DIAGNOSIS — R2689 Other abnormalities of gait and mobility: Secondary | ICD-10-CM | POA: Diagnosis not present

## 2015-01-17 DIAGNOSIS — R278 Other lack of coordination: Secondary | ICD-10-CM | POA: Diagnosis not present

## 2015-01-17 DIAGNOSIS — I5032 Chronic diastolic (congestive) heart failure: Secondary | ICD-10-CM | POA: Diagnosis not present

## 2015-01-17 DIAGNOSIS — R531 Weakness: Secondary | ICD-10-CM | POA: Diagnosis not present

## 2015-01-18 DIAGNOSIS — R531 Weakness: Secondary | ICD-10-CM | POA: Diagnosis not present

## 2015-01-18 DIAGNOSIS — R279 Unspecified lack of coordination: Secondary | ICD-10-CM | POA: Diagnosis not present

## 2015-01-18 DIAGNOSIS — M6281 Muscle weakness (generalized): Secondary | ICD-10-CM | POA: Diagnosis not present

## 2015-01-18 DIAGNOSIS — R278 Other lack of coordination: Secondary | ICD-10-CM | POA: Diagnosis not present

## 2015-01-18 DIAGNOSIS — I5032 Chronic diastolic (congestive) heart failure: Secondary | ICD-10-CM | POA: Diagnosis not present

## 2015-01-18 DIAGNOSIS — R2689 Other abnormalities of gait and mobility: Secondary | ICD-10-CM | POA: Diagnosis not present

## 2015-01-19 DIAGNOSIS — R278 Other lack of coordination: Secondary | ICD-10-CM | POA: Diagnosis not present

## 2015-01-19 DIAGNOSIS — R2689 Other abnormalities of gait and mobility: Secondary | ICD-10-CM | POA: Diagnosis not present

## 2015-01-19 DIAGNOSIS — M6281 Muscle weakness (generalized): Secondary | ICD-10-CM | POA: Diagnosis not present

## 2015-01-19 DIAGNOSIS — F331 Major depressive disorder, recurrent, moderate: Secondary | ICD-10-CM | POA: Diagnosis not present

## 2015-01-19 DIAGNOSIS — R279 Unspecified lack of coordination: Secondary | ICD-10-CM | POA: Diagnosis not present

## 2015-01-19 DIAGNOSIS — I5032 Chronic diastolic (congestive) heart failure: Secondary | ICD-10-CM | POA: Diagnosis not present

## 2015-01-19 DIAGNOSIS — R531 Weakness: Secondary | ICD-10-CM | POA: Diagnosis not present

## 2015-01-20 DIAGNOSIS — R279 Unspecified lack of coordination: Secondary | ICD-10-CM | POA: Diagnosis not present

## 2015-01-20 DIAGNOSIS — R278 Other lack of coordination: Secondary | ICD-10-CM | POA: Diagnosis not present

## 2015-01-20 DIAGNOSIS — I5032 Chronic diastolic (congestive) heart failure: Secondary | ICD-10-CM | POA: Diagnosis not present

## 2015-01-20 DIAGNOSIS — M6281 Muscle weakness (generalized): Secondary | ICD-10-CM | POA: Diagnosis not present

## 2015-01-20 DIAGNOSIS — R2689 Other abnormalities of gait and mobility: Secondary | ICD-10-CM | POA: Diagnosis not present

## 2015-01-20 DIAGNOSIS — R531 Weakness: Secondary | ICD-10-CM | POA: Diagnosis not present

## 2015-01-21 DIAGNOSIS — F331 Major depressive disorder, recurrent, moderate: Secondary | ICD-10-CM | POA: Diagnosis not present

## 2015-01-23 DIAGNOSIS — R531 Weakness: Secondary | ICD-10-CM | POA: Diagnosis not present

## 2015-01-23 DIAGNOSIS — R2689 Other abnormalities of gait and mobility: Secondary | ICD-10-CM | POA: Diagnosis not present

## 2015-01-23 DIAGNOSIS — R278 Other lack of coordination: Secondary | ICD-10-CM | POA: Diagnosis not present

## 2015-01-23 DIAGNOSIS — I5032 Chronic diastolic (congestive) heart failure: Secondary | ICD-10-CM | POA: Diagnosis not present

## 2015-01-23 DIAGNOSIS — R279 Unspecified lack of coordination: Secondary | ICD-10-CM | POA: Diagnosis not present

## 2015-01-23 DIAGNOSIS — M6281 Muscle weakness (generalized): Secondary | ICD-10-CM | POA: Diagnosis not present

## 2015-01-24 DIAGNOSIS — M79675 Pain in left toe(s): Secondary | ICD-10-CM | POA: Diagnosis not present

## 2015-01-24 DIAGNOSIS — I5032 Chronic diastolic (congestive) heart failure: Secondary | ICD-10-CM | POA: Diagnosis not present

## 2015-01-24 DIAGNOSIS — R278 Other lack of coordination: Secondary | ICD-10-CM | POA: Diagnosis not present

## 2015-01-24 DIAGNOSIS — M6281 Muscle weakness (generalized): Secondary | ICD-10-CM | POA: Diagnosis not present

## 2015-01-24 DIAGNOSIS — B351 Tinea unguium: Secondary | ICD-10-CM | POA: Diagnosis not present

## 2015-01-24 DIAGNOSIS — R2689 Other abnormalities of gait and mobility: Secondary | ICD-10-CM | POA: Diagnosis not present

## 2015-01-24 DIAGNOSIS — R279 Unspecified lack of coordination: Secondary | ICD-10-CM | POA: Diagnosis not present

## 2015-01-24 DIAGNOSIS — R531 Weakness: Secondary | ICD-10-CM | POA: Diagnosis not present

## 2015-01-25 DIAGNOSIS — R278 Other lack of coordination: Secondary | ICD-10-CM | POA: Diagnosis not present

## 2015-01-25 DIAGNOSIS — I5032 Chronic diastolic (congestive) heart failure: Secondary | ICD-10-CM | POA: Diagnosis not present

## 2015-01-25 DIAGNOSIS — R279 Unspecified lack of coordination: Secondary | ICD-10-CM | POA: Diagnosis not present

## 2015-01-25 DIAGNOSIS — M6281 Muscle weakness (generalized): Secondary | ICD-10-CM | POA: Diagnosis not present

## 2015-01-25 DIAGNOSIS — R2689 Other abnormalities of gait and mobility: Secondary | ICD-10-CM | POA: Diagnosis not present

## 2015-01-25 DIAGNOSIS — R531 Weakness: Secondary | ICD-10-CM | POA: Diagnosis not present

## 2015-01-26 DIAGNOSIS — M6281 Muscle weakness (generalized): Secondary | ICD-10-CM | POA: Diagnosis not present

## 2015-01-26 DIAGNOSIS — R278 Other lack of coordination: Secondary | ICD-10-CM | POA: Diagnosis not present

## 2015-01-26 DIAGNOSIS — R2689 Other abnormalities of gait and mobility: Secondary | ICD-10-CM | POA: Diagnosis not present

## 2015-01-26 DIAGNOSIS — I5032 Chronic diastolic (congestive) heart failure: Secondary | ICD-10-CM | POA: Diagnosis not present

## 2015-01-26 DIAGNOSIS — R531 Weakness: Secondary | ICD-10-CM | POA: Diagnosis not present

## 2015-01-26 DIAGNOSIS — R279 Unspecified lack of coordination: Secondary | ICD-10-CM | POA: Diagnosis not present

## 2015-01-27 DIAGNOSIS — R278 Other lack of coordination: Secondary | ICD-10-CM | POA: Diagnosis not present

## 2015-01-27 DIAGNOSIS — M6281 Muscle weakness (generalized): Secondary | ICD-10-CM | POA: Diagnosis not present

## 2015-01-27 DIAGNOSIS — I5032 Chronic diastolic (congestive) heart failure: Secondary | ICD-10-CM | POA: Diagnosis not present

## 2015-01-27 DIAGNOSIS — R279 Unspecified lack of coordination: Secondary | ICD-10-CM | POA: Diagnosis not present

## 2015-01-27 DIAGNOSIS — F331 Major depressive disorder, recurrent, moderate: Secondary | ICD-10-CM | POA: Diagnosis not present

## 2015-01-27 DIAGNOSIS — R531 Weakness: Secondary | ICD-10-CM | POA: Diagnosis not present

## 2015-01-27 DIAGNOSIS — R2689 Other abnormalities of gait and mobility: Secondary | ICD-10-CM | POA: Diagnosis not present

## 2015-01-30 DIAGNOSIS — M6281 Muscle weakness (generalized): Secondary | ICD-10-CM | POA: Diagnosis not present

## 2015-01-30 DIAGNOSIS — R531 Weakness: Secondary | ICD-10-CM | POA: Diagnosis not present

## 2015-01-30 DIAGNOSIS — I5032 Chronic diastolic (congestive) heart failure: Secondary | ICD-10-CM | POA: Diagnosis not present

## 2015-01-30 DIAGNOSIS — R279 Unspecified lack of coordination: Secondary | ICD-10-CM | POA: Diagnosis not present

## 2015-01-30 DIAGNOSIS — R278 Other lack of coordination: Secondary | ICD-10-CM | POA: Diagnosis not present

## 2015-01-30 DIAGNOSIS — R2689 Other abnormalities of gait and mobility: Secondary | ICD-10-CM | POA: Diagnosis not present

## 2015-01-31 DIAGNOSIS — I5032 Chronic diastolic (congestive) heart failure: Secondary | ICD-10-CM | POA: Diagnosis not present

## 2015-01-31 DIAGNOSIS — R278 Other lack of coordination: Secondary | ICD-10-CM | POA: Diagnosis not present

## 2015-01-31 DIAGNOSIS — K219 Gastro-esophageal reflux disease without esophagitis: Secondary | ICD-10-CM | POA: Diagnosis not present

## 2015-01-31 DIAGNOSIS — E784 Other hyperlipidemia: Secondary | ICD-10-CM | POA: Diagnosis not present

## 2015-01-31 DIAGNOSIS — J441 Chronic obstructive pulmonary disease with (acute) exacerbation: Secondary | ICD-10-CM | POA: Diagnosis not present

## 2015-01-31 DIAGNOSIS — R279 Unspecified lack of coordination: Secondary | ICD-10-CM | POA: Diagnosis not present

## 2015-01-31 DIAGNOSIS — R531 Weakness: Secondary | ICD-10-CM | POA: Diagnosis not present

## 2015-01-31 DIAGNOSIS — R2689 Other abnormalities of gait and mobility: Secondary | ICD-10-CM | POA: Diagnosis not present

## 2015-01-31 DIAGNOSIS — M6281 Muscle weakness (generalized): Secondary | ICD-10-CM | POA: Diagnosis not present

## 2015-02-01 DIAGNOSIS — M6281 Muscle weakness (generalized): Secondary | ICD-10-CM | POA: Diagnosis not present

## 2015-02-01 DIAGNOSIS — R2689 Other abnormalities of gait and mobility: Secondary | ICD-10-CM | POA: Diagnosis not present

## 2015-02-01 DIAGNOSIS — R278 Other lack of coordination: Secondary | ICD-10-CM | POA: Diagnosis not present

## 2015-02-01 DIAGNOSIS — I5032 Chronic diastolic (congestive) heart failure: Secondary | ICD-10-CM | POA: Diagnosis not present

## 2015-02-01 DIAGNOSIS — R531 Weakness: Secondary | ICD-10-CM | POA: Diagnosis not present

## 2015-02-01 DIAGNOSIS — R279 Unspecified lack of coordination: Secondary | ICD-10-CM | POA: Diagnosis not present

## 2015-02-02 DIAGNOSIS — I5032 Chronic diastolic (congestive) heart failure: Secondary | ICD-10-CM | POA: Diagnosis not present

## 2015-02-02 DIAGNOSIS — R2689 Other abnormalities of gait and mobility: Secondary | ICD-10-CM | POA: Diagnosis not present

## 2015-02-02 DIAGNOSIS — R279 Unspecified lack of coordination: Secondary | ICD-10-CM | POA: Diagnosis not present

## 2015-02-02 DIAGNOSIS — R531 Weakness: Secondary | ICD-10-CM | POA: Diagnosis not present

## 2015-02-02 DIAGNOSIS — R278 Other lack of coordination: Secondary | ICD-10-CM | POA: Diagnosis not present

## 2015-02-02 DIAGNOSIS — M6281 Muscle weakness (generalized): Secondary | ICD-10-CM | POA: Diagnosis not present

## 2015-02-02 DIAGNOSIS — Z23 Encounter for immunization: Secondary | ICD-10-CM | POA: Diagnosis not present

## 2015-02-03 DIAGNOSIS — R278 Other lack of coordination: Secondary | ICD-10-CM | POA: Diagnosis not present

## 2015-02-03 DIAGNOSIS — R531 Weakness: Secondary | ICD-10-CM | POA: Diagnosis not present

## 2015-02-03 DIAGNOSIS — R2689 Other abnormalities of gait and mobility: Secondary | ICD-10-CM | POA: Diagnosis not present

## 2015-02-03 DIAGNOSIS — M6281 Muscle weakness (generalized): Secondary | ICD-10-CM | POA: Diagnosis not present

## 2015-02-03 DIAGNOSIS — R279 Unspecified lack of coordination: Secondary | ICD-10-CM | POA: Diagnosis not present

## 2015-02-03 DIAGNOSIS — I5032 Chronic diastolic (congestive) heart failure: Secondary | ICD-10-CM | POA: Diagnosis not present

## 2015-02-06 DIAGNOSIS — I5032 Chronic diastolic (congestive) heart failure: Secondary | ICD-10-CM | POA: Diagnosis not present

## 2015-02-06 DIAGNOSIS — M6281 Muscle weakness (generalized): Secondary | ICD-10-CM | POA: Diagnosis not present

## 2015-02-06 DIAGNOSIS — R2689 Other abnormalities of gait and mobility: Secondary | ICD-10-CM | POA: Diagnosis not present

## 2015-02-06 DIAGNOSIS — R531 Weakness: Secondary | ICD-10-CM | POA: Diagnosis not present

## 2015-02-06 DIAGNOSIS — R278 Other lack of coordination: Secondary | ICD-10-CM | POA: Diagnosis not present

## 2015-02-06 DIAGNOSIS — R279 Unspecified lack of coordination: Secondary | ICD-10-CM | POA: Diagnosis not present

## 2015-02-07 DIAGNOSIS — R279 Unspecified lack of coordination: Secondary | ICD-10-CM | POA: Diagnosis not present

## 2015-02-07 DIAGNOSIS — R531 Weakness: Secondary | ICD-10-CM | POA: Diagnosis not present

## 2015-02-07 DIAGNOSIS — F331 Major depressive disorder, recurrent, moderate: Secondary | ICD-10-CM | POA: Diagnosis not present

## 2015-02-07 DIAGNOSIS — R2689 Other abnormalities of gait and mobility: Secondary | ICD-10-CM | POA: Diagnosis not present

## 2015-02-07 DIAGNOSIS — M6281 Muscle weakness (generalized): Secondary | ICD-10-CM | POA: Diagnosis not present

## 2015-02-07 DIAGNOSIS — I5032 Chronic diastolic (congestive) heart failure: Secondary | ICD-10-CM | POA: Diagnosis not present

## 2015-02-07 DIAGNOSIS — R278 Other lack of coordination: Secondary | ICD-10-CM | POA: Diagnosis not present

## 2015-02-08 DIAGNOSIS — N390 Urinary tract infection, site not specified: Secondary | ICD-10-CM | POA: Diagnosis not present

## 2015-02-08 DIAGNOSIS — R531 Weakness: Secondary | ICD-10-CM | POA: Diagnosis not present

## 2015-02-08 DIAGNOSIS — R278 Other lack of coordination: Secondary | ICD-10-CM | POA: Diagnosis not present

## 2015-02-08 DIAGNOSIS — R279 Unspecified lack of coordination: Secondary | ICD-10-CM | POA: Diagnosis not present

## 2015-02-08 DIAGNOSIS — M6281 Muscle weakness (generalized): Secondary | ICD-10-CM | POA: Diagnosis not present

## 2015-02-08 DIAGNOSIS — I5032 Chronic diastolic (congestive) heart failure: Secondary | ICD-10-CM | POA: Diagnosis not present

## 2015-02-08 DIAGNOSIS — R2689 Other abnormalities of gait and mobility: Secondary | ICD-10-CM | POA: Diagnosis not present

## 2015-02-09 DIAGNOSIS — R2689 Other abnormalities of gait and mobility: Secondary | ICD-10-CM | POA: Diagnosis not present

## 2015-02-09 DIAGNOSIS — R278 Other lack of coordination: Secondary | ICD-10-CM | POA: Diagnosis not present

## 2015-02-09 DIAGNOSIS — I5032 Chronic diastolic (congestive) heart failure: Secondary | ICD-10-CM | POA: Diagnosis not present

## 2015-02-09 DIAGNOSIS — R531 Weakness: Secondary | ICD-10-CM | POA: Diagnosis not present

## 2015-02-09 DIAGNOSIS — M6281 Muscle weakness (generalized): Secondary | ICD-10-CM | POA: Diagnosis not present

## 2015-02-09 DIAGNOSIS — F331 Major depressive disorder, recurrent, moderate: Secondary | ICD-10-CM | POA: Diagnosis not present

## 2015-02-09 DIAGNOSIS — R279 Unspecified lack of coordination: Secondary | ICD-10-CM | POA: Diagnosis not present

## 2015-02-10 ENCOUNTER — Telehealth: Payer: Self-pay | Admitting: Physician Assistant

## 2015-02-10 DIAGNOSIS — I10 Essential (primary) hypertension: Secondary | ICD-10-CM | POA: Diagnosis not present

## 2015-02-10 DIAGNOSIS — M6281 Muscle weakness (generalized): Secondary | ICD-10-CM | POA: Diagnosis not present

## 2015-02-10 DIAGNOSIS — I5032 Chronic diastolic (congestive) heart failure: Secondary | ICD-10-CM | POA: Diagnosis not present

## 2015-02-10 DIAGNOSIS — R279 Unspecified lack of coordination: Secondary | ICD-10-CM | POA: Diagnosis not present

## 2015-02-10 DIAGNOSIS — R531 Weakness: Secondary | ICD-10-CM | POA: Diagnosis not present

## 2015-02-10 DIAGNOSIS — R278 Other lack of coordination: Secondary | ICD-10-CM | POA: Diagnosis not present

## 2015-02-10 DIAGNOSIS — R2689 Other abnormalities of gait and mobility: Secondary | ICD-10-CM | POA: Diagnosis not present

## 2015-02-11 DIAGNOSIS — R2689 Other abnormalities of gait and mobility: Secondary | ICD-10-CM | POA: Diagnosis not present

## 2015-02-11 DIAGNOSIS — R531 Weakness: Secondary | ICD-10-CM | POA: Diagnosis not present

## 2015-02-11 DIAGNOSIS — R278 Other lack of coordination: Secondary | ICD-10-CM | POA: Diagnosis not present

## 2015-02-11 DIAGNOSIS — M6281 Muscle weakness (generalized): Secondary | ICD-10-CM | POA: Diagnosis not present

## 2015-02-11 DIAGNOSIS — I5032 Chronic diastolic (congestive) heart failure: Secondary | ICD-10-CM | POA: Diagnosis not present

## 2015-02-11 DIAGNOSIS — R279 Unspecified lack of coordination: Secondary | ICD-10-CM | POA: Diagnosis not present

## 2015-02-12 DIAGNOSIS — I5032 Chronic diastolic (congestive) heart failure: Secondary | ICD-10-CM | POA: Diagnosis not present

## 2015-02-12 DIAGNOSIS — R278 Other lack of coordination: Secondary | ICD-10-CM | POA: Diagnosis not present

## 2015-02-12 DIAGNOSIS — R2689 Other abnormalities of gait and mobility: Secondary | ICD-10-CM | POA: Diagnosis not present

## 2015-02-12 DIAGNOSIS — R531 Weakness: Secondary | ICD-10-CM | POA: Diagnosis not present

## 2015-02-12 DIAGNOSIS — M6281 Muscle weakness (generalized): Secondary | ICD-10-CM | POA: Diagnosis not present

## 2015-02-12 DIAGNOSIS — R279 Unspecified lack of coordination: Secondary | ICD-10-CM | POA: Diagnosis not present

## 2015-02-13 DIAGNOSIS — R278 Other lack of coordination: Secondary | ICD-10-CM | POA: Diagnosis not present

## 2015-02-13 DIAGNOSIS — R531 Weakness: Secondary | ICD-10-CM | POA: Diagnosis not present

## 2015-02-13 DIAGNOSIS — M6281 Muscle weakness (generalized): Secondary | ICD-10-CM | POA: Diagnosis not present

## 2015-02-13 DIAGNOSIS — I5032 Chronic diastolic (congestive) heart failure: Secondary | ICD-10-CM | POA: Diagnosis not present

## 2015-02-13 DIAGNOSIS — R2689 Other abnormalities of gait and mobility: Secondary | ICD-10-CM | POA: Diagnosis not present

## 2015-02-13 DIAGNOSIS — R279 Unspecified lack of coordination: Secondary | ICD-10-CM | POA: Diagnosis not present

## 2015-02-14 DIAGNOSIS — R531 Weakness: Secondary | ICD-10-CM | POA: Diagnosis not present

## 2015-02-14 DIAGNOSIS — R279 Unspecified lack of coordination: Secondary | ICD-10-CM | POA: Diagnosis not present

## 2015-02-14 DIAGNOSIS — F331 Major depressive disorder, recurrent, moderate: Secondary | ICD-10-CM | POA: Diagnosis not present

## 2015-02-14 DIAGNOSIS — I5032 Chronic diastolic (congestive) heart failure: Secondary | ICD-10-CM | POA: Diagnosis not present

## 2015-02-14 DIAGNOSIS — M6281 Muscle weakness (generalized): Secondary | ICD-10-CM | POA: Diagnosis not present

## 2015-02-14 DIAGNOSIS — R278 Other lack of coordination: Secondary | ICD-10-CM | POA: Diagnosis not present

## 2015-02-14 DIAGNOSIS — R2689 Other abnormalities of gait and mobility: Secondary | ICD-10-CM | POA: Diagnosis not present

## 2015-02-15 DIAGNOSIS — R278 Other lack of coordination: Secondary | ICD-10-CM | POA: Diagnosis not present

## 2015-02-15 DIAGNOSIS — M6281 Muscle weakness (generalized): Secondary | ICD-10-CM | POA: Diagnosis not present

## 2015-02-15 DIAGNOSIS — R531 Weakness: Secondary | ICD-10-CM | POA: Diagnosis not present

## 2015-02-15 DIAGNOSIS — R279 Unspecified lack of coordination: Secondary | ICD-10-CM | POA: Diagnosis not present

## 2015-02-15 DIAGNOSIS — I5032 Chronic diastolic (congestive) heart failure: Secondary | ICD-10-CM | POA: Diagnosis not present

## 2015-02-15 DIAGNOSIS — R2689 Other abnormalities of gait and mobility: Secondary | ICD-10-CM | POA: Diagnosis not present

## 2015-02-16 DIAGNOSIS — R531 Weakness: Secondary | ICD-10-CM | POA: Diagnosis not present

## 2015-02-16 DIAGNOSIS — F331 Major depressive disorder, recurrent, moderate: Secondary | ICD-10-CM | POA: Diagnosis not present

## 2015-02-16 DIAGNOSIS — R279 Unspecified lack of coordination: Secondary | ICD-10-CM | POA: Diagnosis not present

## 2015-02-16 DIAGNOSIS — R278 Other lack of coordination: Secondary | ICD-10-CM | POA: Diagnosis not present

## 2015-02-16 DIAGNOSIS — R2689 Other abnormalities of gait and mobility: Secondary | ICD-10-CM | POA: Diagnosis not present

## 2015-02-16 DIAGNOSIS — I5032 Chronic diastolic (congestive) heart failure: Secondary | ICD-10-CM | POA: Diagnosis not present

## 2015-02-16 DIAGNOSIS — M6281 Muscle weakness (generalized): Secondary | ICD-10-CM | POA: Diagnosis not present

## 2015-02-17 DIAGNOSIS — R2689 Other abnormalities of gait and mobility: Secondary | ICD-10-CM | POA: Diagnosis not present

## 2015-02-17 DIAGNOSIS — R278 Other lack of coordination: Secondary | ICD-10-CM | POA: Diagnosis not present

## 2015-02-17 DIAGNOSIS — I5032 Chronic diastolic (congestive) heart failure: Secondary | ICD-10-CM | POA: Diagnosis not present

## 2015-02-17 DIAGNOSIS — M6281 Muscle weakness (generalized): Secondary | ICD-10-CM | POA: Diagnosis not present

## 2015-02-17 DIAGNOSIS — R531 Weakness: Secondary | ICD-10-CM | POA: Diagnosis not present

## 2015-02-17 DIAGNOSIS — R279 Unspecified lack of coordination: Secondary | ICD-10-CM | POA: Diagnosis not present

## 2015-02-20 DIAGNOSIS — R2689 Other abnormalities of gait and mobility: Secondary | ICD-10-CM | POA: Diagnosis not present

## 2015-02-20 DIAGNOSIS — R531 Weakness: Secondary | ICD-10-CM | POA: Diagnosis not present

## 2015-02-20 DIAGNOSIS — M6281 Muscle weakness (generalized): Secondary | ICD-10-CM | POA: Diagnosis not present

## 2015-02-20 DIAGNOSIS — R279 Unspecified lack of coordination: Secondary | ICD-10-CM | POA: Diagnosis not present

## 2015-02-20 DIAGNOSIS — R278 Other lack of coordination: Secondary | ICD-10-CM | POA: Diagnosis not present

## 2015-02-20 DIAGNOSIS — I5032 Chronic diastolic (congestive) heart failure: Secondary | ICD-10-CM | POA: Diagnosis not present

## 2015-02-21 DIAGNOSIS — R278 Other lack of coordination: Secondary | ICD-10-CM | POA: Diagnosis not present

## 2015-02-21 DIAGNOSIS — M6281 Muscle weakness (generalized): Secondary | ICD-10-CM | POA: Diagnosis not present

## 2015-02-21 DIAGNOSIS — R2689 Other abnormalities of gait and mobility: Secondary | ICD-10-CM | POA: Diagnosis not present

## 2015-02-21 DIAGNOSIS — R279 Unspecified lack of coordination: Secondary | ICD-10-CM | POA: Diagnosis not present

## 2015-02-21 DIAGNOSIS — I5032 Chronic diastolic (congestive) heart failure: Secondary | ICD-10-CM | POA: Diagnosis not present

## 2015-02-21 DIAGNOSIS — R531 Weakness: Secondary | ICD-10-CM | POA: Diagnosis not present

## 2015-02-21 DIAGNOSIS — F331 Major depressive disorder, recurrent, moderate: Secondary | ICD-10-CM | POA: Diagnosis not present

## 2015-02-22 DIAGNOSIS — M6281 Muscle weakness (generalized): Secondary | ICD-10-CM | POA: Diagnosis not present

## 2015-02-22 DIAGNOSIS — R2689 Other abnormalities of gait and mobility: Secondary | ICD-10-CM | POA: Diagnosis not present

## 2015-02-22 DIAGNOSIS — I5032 Chronic diastolic (congestive) heart failure: Secondary | ICD-10-CM | POA: Diagnosis not present

## 2015-02-22 DIAGNOSIS — R278 Other lack of coordination: Secondary | ICD-10-CM | POA: Diagnosis not present

## 2015-02-22 DIAGNOSIS — R279 Unspecified lack of coordination: Secondary | ICD-10-CM | POA: Diagnosis not present

## 2015-02-22 DIAGNOSIS — R531 Weakness: Secondary | ICD-10-CM | POA: Diagnosis not present

## 2015-02-23 DIAGNOSIS — I5032 Chronic diastolic (congestive) heart failure: Secondary | ICD-10-CM | POA: Diagnosis not present

## 2015-02-23 DIAGNOSIS — F331 Major depressive disorder, recurrent, moderate: Secondary | ICD-10-CM | POA: Diagnosis not present

## 2015-02-23 DIAGNOSIS — M6281 Muscle weakness (generalized): Secondary | ICD-10-CM | POA: Diagnosis not present

## 2015-02-23 DIAGNOSIS — R531 Weakness: Secondary | ICD-10-CM | POA: Diagnosis not present

## 2015-02-23 DIAGNOSIS — R278 Other lack of coordination: Secondary | ICD-10-CM | POA: Diagnosis not present

## 2015-02-23 DIAGNOSIS — R2689 Other abnormalities of gait and mobility: Secondary | ICD-10-CM | POA: Diagnosis not present

## 2015-02-23 DIAGNOSIS — R279 Unspecified lack of coordination: Secondary | ICD-10-CM | POA: Diagnosis not present

## 2015-02-24 DIAGNOSIS — R278 Other lack of coordination: Secondary | ICD-10-CM | POA: Diagnosis not present

## 2015-02-24 DIAGNOSIS — I5032 Chronic diastolic (congestive) heart failure: Secondary | ICD-10-CM | POA: Diagnosis not present

## 2015-02-24 DIAGNOSIS — R531 Weakness: Secondary | ICD-10-CM | POA: Diagnosis not present

## 2015-02-24 DIAGNOSIS — R2689 Other abnormalities of gait and mobility: Secondary | ICD-10-CM | POA: Diagnosis not present

## 2015-02-24 DIAGNOSIS — M6281 Muscle weakness (generalized): Secondary | ICD-10-CM | POA: Diagnosis not present

## 2015-02-24 DIAGNOSIS — R279 Unspecified lack of coordination: Secondary | ICD-10-CM | POA: Diagnosis not present

## 2015-02-26 DIAGNOSIS — R278 Other lack of coordination: Secondary | ICD-10-CM | POA: Diagnosis not present

## 2015-02-26 DIAGNOSIS — M6281 Muscle weakness (generalized): Secondary | ICD-10-CM | POA: Diagnosis not present

## 2015-02-26 DIAGNOSIS — R2689 Other abnormalities of gait and mobility: Secondary | ICD-10-CM | POA: Diagnosis not present

## 2015-02-26 DIAGNOSIS — R531 Weakness: Secondary | ICD-10-CM | POA: Diagnosis not present

## 2015-02-26 DIAGNOSIS — I5032 Chronic diastolic (congestive) heart failure: Secondary | ICD-10-CM | POA: Diagnosis not present

## 2015-02-26 DIAGNOSIS — R279 Unspecified lack of coordination: Secondary | ICD-10-CM | POA: Diagnosis not present

## 2015-02-27 DIAGNOSIS — R279 Unspecified lack of coordination: Secondary | ICD-10-CM | POA: Diagnosis not present

## 2015-02-27 DIAGNOSIS — R2689 Other abnormalities of gait and mobility: Secondary | ICD-10-CM | POA: Diagnosis not present

## 2015-02-27 DIAGNOSIS — F331 Major depressive disorder, recurrent, moderate: Secondary | ICD-10-CM | POA: Diagnosis not present

## 2015-02-27 DIAGNOSIS — R531 Weakness: Secondary | ICD-10-CM | POA: Diagnosis not present

## 2015-02-27 DIAGNOSIS — M6281 Muscle weakness (generalized): Secondary | ICD-10-CM | POA: Diagnosis not present

## 2015-02-27 DIAGNOSIS — R278 Other lack of coordination: Secondary | ICD-10-CM | POA: Diagnosis not present

## 2015-02-27 DIAGNOSIS — I5032 Chronic diastolic (congestive) heart failure: Secondary | ICD-10-CM | POA: Diagnosis not present

## 2015-02-28 DIAGNOSIS — R531 Weakness: Secondary | ICD-10-CM | POA: Diagnosis not present

## 2015-02-28 DIAGNOSIS — R278 Other lack of coordination: Secondary | ICD-10-CM | POA: Diagnosis not present

## 2015-02-28 DIAGNOSIS — M6281 Muscle weakness (generalized): Secondary | ICD-10-CM | POA: Diagnosis not present

## 2015-02-28 DIAGNOSIS — R2689 Other abnormalities of gait and mobility: Secondary | ICD-10-CM | POA: Diagnosis not present

## 2015-02-28 DIAGNOSIS — I5032 Chronic diastolic (congestive) heart failure: Secondary | ICD-10-CM | POA: Diagnosis not present

## 2015-02-28 DIAGNOSIS — R279 Unspecified lack of coordination: Secondary | ICD-10-CM | POA: Diagnosis not present

## 2015-03-01 DIAGNOSIS — R2689 Other abnormalities of gait and mobility: Secondary | ICD-10-CM | POA: Diagnosis not present

## 2015-03-01 DIAGNOSIS — M25562 Pain in left knee: Secondary | ICD-10-CM | POA: Diagnosis not present

## 2015-03-01 DIAGNOSIS — M170 Bilateral primary osteoarthritis of knee: Secondary | ICD-10-CM | POA: Diagnosis not present

## 2015-03-01 DIAGNOSIS — M25561 Pain in right knee: Secondary | ICD-10-CM | POA: Diagnosis not present

## 2015-03-01 DIAGNOSIS — R278 Other lack of coordination: Secondary | ICD-10-CM | POA: Diagnosis not present

## 2015-03-01 DIAGNOSIS — R531 Weakness: Secondary | ICD-10-CM | POA: Diagnosis not present

## 2015-03-01 DIAGNOSIS — M179 Osteoarthritis of knee, unspecified: Secondary | ICD-10-CM | POA: Diagnosis not present

## 2015-03-01 DIAGNOSIS — I5032 Chronic diastolic (congestive) heart failure: Secondary | ICD-10-CM | POA: Diagnosis not present

## 2015-03-01 DIAGNOSIS — M6281 Muscle weakness (generalized): Secondary | ICD-10-CM | POA: Diagnosis not present

## 2015-03-01 DIAGNOSIS — G8929 Other chronic pain: Secondary | ICD-10-CM | POA: Diagnosis not present

## 2015-03-01 DIAGNOSIS — F331 Major depressive disorder, recurrent, moderate: Secondary | ICD-10-CM | POA: Diagnosis not present

## 2015-03-01 DIAGNOSIS — R279 Unspecified lack of coordination: Secondary | ICD-10-CM | POA: Diagnosis not present

## 2015-03-02 DIAGNOSIS — M6281 Muscle weakness (generalized): Secondary | ICD-10-CM | POA: Diagnosis not present

## 2015-03-02 DIAGNOSIS — R279 Unspecified lack of coordination: Secondary | ICD-10-CM | POA: Diagnosis not present

## 2015-03-02 DIAGNOSIS — R278 Other lack of coordination: Secondary | ICD-10-CM | POA: Diagnosis not present

## 2015-03-02 DIAGNOSIS — R2689 Other abnormalities of gait and mobility: Secondary | ICD-10-CM | POA: Diagnosis not present

## 2015-03-02 DIAGNOSIS — R531 Weakness: Secondary | ICD-10-CM | POA: Diagnosis not present

## 2015-03-02 DIAGNOSIS — I5032 Chronic diastolic (congestive) heart failure: Secondary | ICD-10-CM | POA: Diagnosis not present

## 2015-03-03 DIAGNOSIS — R2689 Other abnormalities of gait and mobility: Secondary | ICD-10-CM | POA: Diagnosis not present

## 2015-03-03 DIAGNOSIS — R531 Weakness: Secondary | ICD-10-CM | POA: Diagnosis not present

## 2015-03-03 DIAGNOSIS — I5032 Chronic diastolic (congestive) heart failure: Secondary | ICD-10-CM | POA: Diagnosis not present

## 2015-03-03 DIAGNOSIS — R278 Other lack of coordination: Secondary | ICD-10-CM | POA: Diagnosis not present

## 2015-03-03 DIAGNOSIS — R279 Unspecified lack of coordination: Secondary | ICD-10-CM | POA: Diagnosis not present

## 2015-03-03 DIAGNOSIS — M6281 Muscle weakness (generalized): Secondary | ICD-10-CM | POA: Diagnosis not present

## 2015-03-05 DIAGNOSIS — F331 Major depressive disorder, recurrent, moderate: Secondary | ICD-10-CM | POA: Diagnosis not present

## 2015-03-06 DIAGNOSIS — M6281 Muscle weakness (generalized): Secondary | ICD-10-CM | POA: Diagnosis not present

## 2015-03-06 DIAGNOSIS — R531 Weakness: Secondary | ICD-10-CM | POA: Diagnosis not present

## 2015-03-06 DIAGNOSIS — I5032 Chronic diastolic (congestive) heart failure: Secondary | ICD-10-CM | POA: Diagnosis not present

## 2015-03-06 DIAGNOSIS — R279 Unspecified lack of coordination: Secondary | ICD-10-CM | POA: Diagnosis not present

## 2015-03-06 DIAGNOSIS — R2689 Other abnormalities of gait and mobility: Secondary | ICD-10-CM | POA: Diagnosis not present

## 2015-03-06 DIAGNOSIS — R278 Other lack of coordination: Secondary | ICD-10-CM | POA: Diagnosis not present

## 2015-03-07 DIAGNOSIS — R531 Weakness: Secondary | ICD-10-CM | POA: Diagnosis not present

## 2015-03-07 DIAGNOSIS — I5032 Chronic diastolic (congestive) heart failure: Secondary | ICD-10-CM | POA: Diagnosis not present

## 2015-03-07 DIAGNOSIS — R279 Unspecified lack of coordination: Secondary | ICD-10-CM | POA: Diagnosis not present

## 2015-03-07 DIAGNOSIS — R2689 Other abnormalities of gait and mobility: Secondary | ICD-10-CM | POA: Diagnosis not present

## 2015-03-07 DIAGNOSIS — R278 Other lack of coordination: Secondary | ICD-10-CM | POA: Diagnosis not present

## 2015-03-07 DIAGNOSIS — M6281 Muscle weakness (generalized): Secondary | ICD-10-CM | POA: Diagnosis not present

## 2015-03-08 DIAGNOSIS — R531 Weakness: Secondary | ICD-10-CM | POA: Diagnosis not present

## 2015-03-08 DIAGNOSIS — R278 Other lack of coordination: Secondary | ICD-10-CM | POA: Diagnosis not present

## 2015-03-08 DIAGNOSIS — M6281 Muscle weakness (generalized): Secondary | ICD-10-CM | POA: Diagnosis not present

## 2015-03-08 DIAGNOSIS — I5032 Chronic diastolic (congestive) heart failure: Secondary | ICD-10-CM | POA: Diagnosis not present

## 2015-03-08 DIAGNOSIS — R279 Unspecified lack of coordination: Secondary | ICD-10-CM | POA: Diagnosis not present

## 2015-03-08 DIAGNOSIS — R2689 Other abnormalities of gait and mobility: Secondary | ICD-10-CM | POA: Diagnosis not present

## 2015-03-09 DIAGNOSIS — R531 Weakness: Secondary | ICD-10-CM | POA: Diagnosis not present

## 2015-03-09 DIAGNOSIS — R279 Unspecified lack of coordination: Secondary | ICD-10-CM | POA: Diagnosis not present

## 2015-03-09 DIAGNOSIS — R2689 Other abnormalities of gait and mobility: Secondary | ICD-10-CM | POA: Diagnosis not present

## 2015-03-09 DIAGNOSIS — R278 Other lack of coordination: Secondary | ICD-10-CM | POA: Diagnosis not present

## 2015-03-09 DIAGNOSIS — M6281 Muscle weakness (generalized): Secondary | ICD-10-CM | POA: Diagnosis not present

## 2015-03-09 DIAGNOSIS — I5032 Chronic diastolic (congestive) heart failure: Secondary | ICD-10-CM | POA: Diagnosis not present

## 2015-03-10 DIAGNOSIS — R279 Unspecified lack of coordination: Secondary | ICD-10-CM | POA: Diagnosis not present

## 2015-03-10 DIAGNOSIS — R278 Other lack of coordination: Secondary | ICD-10-CM | POA: Diagnosis not present

## 2015-03-10 DIAGNOSIS — R2689 Other abnormalities of gait and mobility: Secondary | ICD-10-CM | POA: Diagnosis not present

## 2015-03-10 DIAGNOSIS — R531 Weakness: Secondary | ICD-10-CM | POA: Diagnosis not present

## 2015-03-10 DIAGNOSIS — M6281 Muscle weakness (generalized): Secondary | ICD-10-CM | POA: Diagnosis not present

## 2015-03-10 DIAGNOSIS — I5032 Chronic diastolic (congestive) heart failure: Secondary | ICD-10-CM | POA: Diagnosis not present

## 2015-03-11 DIAGNOSIS — R531 Weakness: Secondary | ICD-10-CM | POA: Diagnosis not present

## 2015-03-11 DIAGNOSIS — I5032 Chronic diastolic (congestive) heart failure: Secondary | ICD-10-CM | POA: Diagnosis not present

## 2015-03-11 DIAGNOSIS — R278 Other lack of coordination: Secondary | ICD-10-CM | POA: Diagnosis not present

## 2015-03-11 DIAGNOSIS — R2689 Other abnormalities of gait and mobility: Secondary | ICD-10-CM | POA: Diagnosis not present

## 2015-03-11 DIAGNOSIS — M6281 Muscle weakness (generalized): Secondary | ICD-10-CM | POA: Diagnosis not present

## 2015-03-11 DIAGNOSIS — R279 Unspecified lack of coordination: Secondary | ICD-10-CM | POA: Diagnosis not present

## 2015-03-13 DIAGNOSIS — I5032 Chronic diastolic (congestive) heart failure: Secondary | ICD-10-CM | POA: Diagnosis not present

## 2015-03-13 DIAGNOSIS — R531 Weakness: Secondary | ICD-10-CM | POA: Diagnosis not present

## 2015-03-13 DIAGNOSIS — R279 Unspecified lack of coordination: Secondary | ICD-10-CM | POA: Diagnosis not present

## 2015-03-13 DIAGNOSIS — M6281 Muscle weakness (generalized): Secondary | ICD-10-CM | POA: Diagnosis not present

## 2015-03-13 DIAGNOSIS — R278 Other lack of coordination: Secondary | ICD-10-CM | POA: Diagnosis not present

## 2015-03-13 DIAGNOSIS — R2689 Other abnormalities of gait and mobility: Secondary | ICD-10-CM | POA: Diagnosis not present

## 2015-03-14 DIAGNOSIS — R279 Unspecified lack of coordination: Secondary | ICD-10-CM | POA: Diagnosis not present

## 2015-03-14 DIAGNOSIS — I5032 Chronic diastolic (congestive) heart failure: Secondary | ICD-10-CM | POA: Diagnosis not present

## 2015-03-14 DIAGNOSIS — R2689 Other abnormalities of gait and mobility: Secondary | ICD-10-CM | POA: Diagnosis not present

## 2015-03-14 DIAGNOSIS — R531 Weakness: Secondary | ICD-10-CM | POA: Diagnosis not present

## 2015-03-14 DIAGNOSIS — R278 Other lack of coordination: Secondary | ICD-10-CM | POA: Diagnosis not present

## 2015-03-14 DIAGNOSIS — F331 Major depressive disorder, recurrent, moderate: Secondary | ICD-10-CM | POA: Diagnosis not present

## 2015-03-14 DIAGNOSIS — M6281 Muscle weakness (generalized): Secondary | ICD-10-CM | POA: Diagnosis not present

## 2015-03-15 DIAGNOSIS — I5032 Chronic diastolic (congestive) heart failure: Secondary | ICD-10-CM | POA: Diagnosis not present

## 2015-03-15 DIAGNOSIS — R2689 Other abnormalities of gait and mobility: Secondary | ICD-10-CM | POA: Diagnosis not present

## 2015-03-15 DIAGNOSIS — R278 Other lack of coordination: Secondary | ICD-10-CM | POA: Diagnosis not present

## 2015-03-15 DIAGNOSIS — R279 Unspecified lack of coordination: Secondary | ICD-10-CM | POA: Diagnosis not present

## 2015-03-15 DIAGNOSIS — M6281 Muscle weakness (generalized): Secondary | ICD-10-CM | POA: Diagnosis not present

## 2015-03-15 DIAGNOSIS — R531 Weakness: Secondary | ICD-10-CM | POA: Diagnosis not present

## 2015-03-17 DIAGNOSIS — R531 Weakness: Secondary | ICD-10-CM | POA: Diagnosis not present

## 2015-03-17 DIAGNOSIS — R278 Other lack of coordination: Secondary | ICD-10-CM | POA: Diagnosis not present

## 2015-03-17 DIAGNOSIS — R279 Unspecified lack of coordination: Secondary | ICD-10-CM | POA: Diagnosis not present

## 2015-03-17 DIAGNOSIS — I5032 Chronic diastolic (congestive) heart failure: Secondary | ICD-10-CM | POA: Diagnosis not present

## 2015-03-17 DIAGNOSIS — F331 Major depressive disorder, recurrent, moderate: Secondary | ICD-10-CM | POA: Diagnosis not present

## 2015-03-17 DIAGNOSIS — R2689 Other abnormalities of gait and mobility: Secondary | ICD-10-CM | POA: Diagnosis not present

## 2015-03-17 DIAGNOSIS — M6281 Muscle weakness (generalized): Secondary | ICD-10-CM | POA: Diagnosis not present

## 2015-03-18 DIAGNOSIS — R2689 Other abnormalities of gait and mobility: Secondary | ICD-10-CM | POA: Diagnosis not present

## 2015-03-18 DIAGNOSIS — M6281 Muscle weakness (generalized): Secondary | ICD-10-CM | POA: Diagnosis not present

## 2015-03-18 DIAGNOSIS — R278 Other lack of coordination: Secondary | ICD-10-CM | POA: Diagnosis not present

## 2015-03-18 DIAGNOSIS — R279 Unspecified lack of coordination: Secondary | ICD-10-CM | POA: Diagnosis not present

## 2015-03-18 DIAGNOSIS — I5032 Chronic diastolic (congestive) heart failure: Secondary | ICD-10-CM | POA: Diagnosis not present

## 2015-03-18 DIAGNOSIS — R531 Weakness: Secondary | ICD-10-CM | POA: Diagnosis not present

## 2015-03-20 DIAGNOSIS — R279 Unspecified lack of coordination: Secondary | ICD-10-CM | POA: Diagnosis not present

## 2015-03-20 DIAGNOSIS — I5032 Chronic diastolic (congestive) heart failure: Secondary | ICD-10-CM | POA: Diagnosis not present

## 2015-03-20 DIAGNOSIS — M6281 Muscle weakness (generalized): Secondary | ICD-10-CM | POA: Diagnosis not present

## 2015-03-20 DIAGNOSIS — R278 Other lack of coordination: Secondary | ICD-10-CM | POA: Diagnosis not present

## 2015-03-20 DIAGNOSIS — R531 Weakness: Secondary | ICD-10-CM | POA: Diagnosis not present

## 2015-03-20 DIAGNOSIS — R2689 Other abnormalities of gait and mobility: Secondary | ICD-10-CM | POA: Diagnosis not present

## 2015-03-21 DIAGNOSIS — J441 Chronic obstructive pulmonary disease with (acute) exacerbation: Secondary | ICD-10-CM | POA: Diagnosis not present

## 2015-03-21 DIAGNOSIS — R2689 Other abnormalities of gait and mobility: Secondary | ICD-10-CM | POA: Diagnosis not present

## 2015-03-21 DIAGNOSIS — M6281 Muscle weakness (generalized): Secondary | ICD-10-CM | POA: Diagnosis not present

## 2015-03-21 DIAGNOSIS — R279 Unspecified lack of coordination: Secondary | ICD-10-CM | POA: Diagnosis not present

## 2015-03-21 DIAGNOSIS — R278 Other lack of coordination: Secondary | ICD-10-CM | POA: Diagnosis not present

## 2015-03-21 DIAGNOSIS — E784 Other hyperlipidemia: Secondary | ICD-10-CM | POA: Diagnosis not present

## 2015-03-21 DIAGNOSIS — R531 Weakness: Secondary | ICD-10-CM | POA: Diagnosis not present

## 2015-03-21 DIAGNOSIS — F331 Major depressive disorder, recurrent, moderate: Secondary | ICD-10-CM | POA: Diagnosis not present

## 2015-03-21 DIAGNOSIS — I5032 Chronic diastolic (congestive) heart failure: Secondary | ICD-10-CM | POA: Diagnosis not present

## 2015-03-21 DIAGNOSIS — K219 Gastro-esophageal reflux disease without esophagitis: Secondary | ICD-10-CM | POA: Diagnosis not present

## 2015-03-22 DIAGNOSIS — R279 Unspecified lack of coordination: Secondary | ICD-10-CM | POA: Diagnosis not present

## 2015-03-22 DIAGNOSIS — M6281 Muscle weakness (generalized): Secondary | ICD-10-CM | POA: Diagnosis not present

## 2015-03-22 DIAGNOSIS — I5032 Chronic diastolic (congestive) heart failure: Secondary | ICD-10-CM | POA: Diagnosis not present

## 2015-03-22 DIAGNOSIS — R531 Weakness: Secondary | ICD-10-CM | POA: Diagnosis not present

## 2015-03-22 DIAGNOSIS — R278 Other lack of coordination: Secondary | ICD-10-CM | POA: Diagnosis not present

## 2015-03-22 DIAGNOSIS — R2689 Other abnormalities of gait and mobility: Secondary | ICD-10-CM | POA: Diagnosis not present

## 2015-03-23 DIAGNOSIS — F331 Major depressive disorder, recurrent, moderate: Secondary | ICD-10-CM | POA: Diagnosis not present

## 2015-03-28 DIAGNOSIS — F331 Major depressive disorder, recurrent, moderate: Secondary | ICD-10-CM | POA: Diagnosis not present

## 2015-04-01 DIAGNOSIS — F331 Major depressive disorder, recurrent, moderate: Secondary | ICD-10-CM | POA: Diagnosis not present

## 2015-04-05 DIAGNOSIS — F331 Major depressive disorder, recurrent, moderate: Secondary | ICD-10-CM | POA: Diagnosis not present

## 2015-04-08 DIAGNOSIS — F331 Major depressive disorder, recurrent, moderate: Secondary | ICD-10-CM | POA: Diagnosis not present

## 2015-04-11 DIAGNOSIS — F331 Major depressive disorder, recurrent, moderate: Secondary | ICD-10-CM | POA: Diagnosis not present

## 2015-04-13 DIAGNOSIS — F331 Major depressive disorder, recurrent, moderate: Secondary | ICD-10-CM | POA: Diagnosis not present

## 2015-04-18 DIAGNOSIS — F331 Major depressive disorder, recurrent, moderate: Secondary | ICD-10-CM | POA: Diagnosis not present

## 2015-04-22 DIAGNOSIS — F331 Major depressive disorder, recurrent, moderate: Secondary | ICD-10-CM | POA: Diagnosis not present

## 2015-04-24 DIAGNOSIS — F331 Major depressive disorder, recurrent, moderate: Secondary | ICD-10-CM | POA: Diagnosis not present

## 2015-04-27 DIAGNOSIS — M79674 Pain in right toe(s): Secondary | ICD-10-CM | POA: Diagnosis not present

## 2015-04-27 DIAGNOSIS — B351 Tinea unguium: Secondary | ICD-10-CM | POA: Diagnosis not present

## 2015-04-27 DIAGNOSIS — F331 Major depressive disorder, recurrent, moderate: Secondary | ICD-10-CM | POA: Diagnosis not present

## 2015-05-02 DIAGNOSIS — F331 Major depressive disorder, recurrent, moderate: Secondary | ICD-10-CM | POA: Diagnosis not present

## 2015-05-04 DIAGNOSIS — F331 Major depressive disorder, recurrent, moderate: Secondary | ICD-10-CM | POA: Diagnosis not present

## 2015-05-05 DIAGNOSIS — I5032 Chronic diastolic (congestive) heart failure: Secondary | ICD-10-CM | POA: Diagnosis not present

## 2015-05-05 DIAGNOSIS — K219 Gastro-esophageal reflux disease without esophagitis: Secondary | ICD-10-CM | POA: Diagnosis not present

## 2015-05-05 DIAGNOSIS — J441 Chronic obstructive pulmonary disease with (acute) exacerbation: Secondary | ICD-10-CM | POA: Diagnosis not present

## 2015-05-05 DIAGNOSIS — N390 Urinary tract infection, site not specified: Secondary | ICD-10-CM | POA: Diagnosis not present

## 2015-05-05 DIAGNOSIS — E784 Other hyperlipidemia: Secondary | ICD-10-CM | POA: Diagnosis not present

## 2015-05-09 DIAGNOSIS — F331 Major depressive disorder, recurrent, moderate: Secondary | ICD-10-CM | POA: Diagnosis not present

## 2015-05-10 DIAGNOSIS — M1990 Unspecified osteoarthritis, unspecified site: Secondary | ICD-10-CM | POA: Diagnosis not present

## 2015-05-10 DIAGNOSIS — R7989 Other specified abnormal findings of blood chemistry: Secondary | ICD-10-CM | POA: Diagnosis not present

## 2015-05-10 DIAGNOSIS — K5282 Eosinophilic colitis: Secondary | ICD-10-CM | POA: Diagnosis not present

## 2015-05-10 DIAGNOSIS — R1084 Generalized abdominal pain: Secondary | ICD-10-CM | POA: Diagnosis not present

## 2015-05-10 DIAGNOSIS — I5021 Acute systolic (congestive) heart failure: Secondary | ICD-10-CM | POA: Diagnosis not present

## 2015-05-10 DIAGNOSIS — R410 Disorientation, unspecified: Secondary | ICD-10-CM | POA: Diagnosis not present

## 2015-05-10 DIAGNOSIS — K6289 Other specified diseases of anus and rectum: Secondary | ICD-10-CM | POA: Diagnosis not present

## 2015-05-10 DIAGNOSIS — A047 Enterocolitis due to Clostridium difficile: Secondary | ICD-10-CM | POA: Diagnosis not present

## 2015-05-10 DIAGNOSIS — R402411 Glasgow coma scale score 13-15, in the field [EMT or ambulance]: Secondary | ICD-10-CM | POA: Diagnosis not present

## 2015-05-10 DIAGNOSIS — Z6841 Body Mass Index (BMI) 40.0 and over, adult: Secondary | ICD-10-CM | POA: Diagnosis not present

## 2015-05-10 DIAGNOSIS — I482 Chronic atrial fibrillation: Secondary | ICD-10-CM | POA: Diagnosis not present

## 2015-05-10 DIAGNOSIS — F419 Anxiety disorder, unspecified: Secondary | ICD-10-CM | POA: Diagnosis not present

## 2015-05-10 DIAGNOSIS — I959 Hypotension, unspecified: Secondary | ICD-10-CM | POA: Diagnosis not present

## 2015-05-10 DIAGNOSIS — D72829 Elevated white blood cell count, unspecified: Secondary | ICD-10-CM | POA: Diagnosis not present

## 2015-05-10 DIAGNOSIS — N390 Urinary tract infection, site not specified: Secondary | ICD-10-CM | POA: Diagnosis not present

## 2015-05-10 DIAGNOSIS — R109 Unspecified abdominal pain: Secondary | ICD-10-CM | POA: Diagnosis not present

## 2015-05-11 DIAGNOSIS — E6609 Other obesity due to excess calories: Secondary | ICD-10-CM | POA: Diagnosis not present

## 2015-05-11 DIAGNOSIS — F419 Anxiety disorder, unspecified: Secondary | ICD-10-CM | POA: Diagnosis not present

## 2015-05-11 DIAGNOSIS — R4182 Altered mental status, unspecified: Secondary | ICD-10-CM | POA: Diagnosis not present

## 2015-05-11 DIAGNOSIS — R278 Other lack of coordination: Secondary | ICD-10-CM | POA: Diagnosis not present

## 2015-05-11 DIAGNOSIS — E079 Disorder of thyroid, unspecified: Secondary | ICD-10-CM | POA: Diagnosis not present

## 2015-05-11 DIAGNOSIS — J45909 Unspecified asthma, uncomplicated: Secondary | ICD-10-CM | POA: Diagnosis present

## 2015-05-11 DIAGNOSIS — I10 Essential (primary) hypertension: Secondary | ICD-10-CM | POA: Diagnosis not present

## 2015-05-11 DIAGNOSIS — I872 Venous insufficiency (chronic) (peripheral): Secondary | ICD-10-CM | POA: Diagnosis not present

## 2015-05-11 DIAGNOSIS — Z880 Allergy status to penicillin: Secondary | ICD-10-CM | POA: Diagnosis not present

## 2015-05-11 DIAGNOSIS — Z79899 Other long term (current) drug therapy: Secondary | ICD-10-CM | POA: Diagnosis not present

## 2015-05-11 DIAGNOSIS — I878 Other specified disorders of veins: Secondary | ICD-10-CM | POA: Diagnosis not present

## 2015-05-11 DIAGNOSIS — R402411 Glasgow coma scale score 13-15, in the field [EMT or ambulance]: Secondary | ICD-10-CM | POA: Diagnosis not present

## 2015-05-11 DIAGNOSIS — R109 Unspecified abdominal pain: Secondary | ICD-10-CM | POA: Diagnosis not present

## 2015-05-11 DIAGNOSIS — Z885 Allergy status to narcotic agent status: Secondary | ICD-10-CM | POA: Diagnosis not present

## 2015-05-11 DIAGNOSIS — F39 Unspecified mood [affective] disorder: Secondary | ICD-10-CM | POA: Diagnosis not present

## 2015-05-11 DIAGNOSIS — R4189 Other symptoms and signs involving cognitive functions and awareness: Secondary | ICD-10-CM | POA: Diagnosis not present

## 2015-05-11 DIAGNOSIS — K5900 Constipation, unspecified: Secondary | ICD-10-CM | POA: Diagnosis not present

## 2015-05-11 DIAGNOSIS — K760 Fatty (change of) liver, not elsewhere classified: Secondary | ICD-10-CM | POA: Diagnosis not present

## 2015-05-11 DIAGNOSIS — K51218 Ulcerative (chronic) proctitis with other complication: Secondary | ICD-10-CM | POA: Diagnosis not present

## 2015-05-11 DIAGNOSIS — E876 Hypokalemia: Secondary | ICD-10-CM | POA: Diagnosis not present

## 2015-05-11 DIAGNOSIS — I5032 Chronic diastolic (congestive) heart failure: Secondary | ICD-10-CM | POA: Diagnosis not present

## 2015-05-11 DIAGNOSIS — R42 Dizziness and giddiness: Secondary | ICD-10-CM | POA: Diagnosis not present

## 2015-05-11 DIAGNOSIS — R531 Weakness: Secondary | ICD-10-CM | POA: Diagnosis present

## 2015-05-11 DIAGNOSIS — F418 Other specified anxiety disorders: Secondary | ICD-10-CM | POA: Diagnosis not present

## 2015-05-11 DIAGNOSIS — F339 Major depressive disorder, recurrent, unspecified: Secondary | ICD-10-CM | POA: Diagnosis not present

## 2015-05-11 DIAGNOSIS — Z6841 Body Mass Index (BMI) 40.0 and over, adult: Secondary | ICD-10-CM | POA: Diagnosis not present

## 2015-05-11 DIAGNOSIS — R2689 Other abnormalities of gait and mobility: Secondary | ICD-10-CM | POA: Diagnosis not present

## 2015-05-11 DIAGNOSIS — R279 Unspecified lack of coordination: Secondary | ICD-10-CM | POA: Diagnosis not present

## 2015-05-11 DIAGNOSIS — M1990 Unspecified osteoarthritis, unspecified site: Secondary | ICD-10-CM | POA: Diagnosis not present

## 2015-05-11 DIAGNOSIS — K047 Periapical abscess without sinus: Secondary | ICD-10-CM | POA: Diagnosis not present

## 2015-05-11 DIAGNOSIS — K219 Gastro-esophageal reflux disease without esophagitis: Secondary | ICD-10-CM | POA: Diagnosis not present

## 2015-05-11 DIAGNOSIS — Z7401 Bed confinement status: Secondary | ICD-10-CM | POA: Diagnosis not present

## 2015-05-11 DIAGNOSIS — A047 Enterocolitis due to Clostridium difficile: Secondary | ICD-10-CM | POA: Diagnosis not present

## 2015-05-11 DIAGNOSIS — I110 Hypertensive heart disease with heart failure: Secondary | ICD-10-CM | POA: Diagnosis not present

## 2015-05-11 DIAGNOSIS — Z881 Allergy status to other antibiotic agents status: Secondary | ICD-10-CM | POA: Diagnosis not present

## 2015-05-11 DIAGNOSIS — M6281 Muscle weakness (generalized): Secondary | ICD-10-CM | POA: Diagnosis not present

## 2015-05-11 DIAGNOSIS — M545 Low back pain: Secondary | ICD-10-CM | POA: Diagnosis not present

## 2015-05-11 DIAGNOSIS — I7389 Other specified peripheral vascular diseases: Secondary | ICD-10-CM | POA: Diagnosis not present

## 2015-05-11 DIAGNOSIS — F29 Unspecified psychosis not due to a substance or known physiological condition: Secondary | ICD-10-CM | POA: Diagnosis not present

## 2015-05-11 DIAGNOSIS — R451 Restlessness and agitation: Secondary | ICD-10-CM | POA: Diagnosis present

## 2015-05-11 DIAGNOSIS — R410 Disorientation, unspecified: Secondary | ICD-10-CM | POA: Diagnosis not present

## 2015-05-11 DIAGNOSIS — N390 Urinary tract infection, site not specified: Secondary | ICD-10-CM | POA: Diagnosis not present

## 2015-05-11 DIAGNOSIS — I509 Heart failure, unspecified: Secondary | ICD-10-CM | POA: Diagnosis not present

## 2015-05-11 DIAGNOSIS — I739 Peripheral vascular disease, unspecified: Secondary | ICD-10-CM | POA: Diagnosis not present

## 2015-05-11 DIAGNOSIS — Z452 Encounter for adjustment and management of vascular access device: Secondary | ICD-10-CM | POA: Diagnosis not present

## 2015-05-18 DIAGNOSIS — J441 Chronic obstructive pulmonary disease with (acute) exacerbation: Secondary | ICD-10-CM | POA: Diagnosis not present

## 2015-05-18 DIAGNOSIS — J45909 Unspecified asthma, uncomplicated: Secondary | ICD-10-CM | POA: Diagnosis not present

## 2015-05-18 DIAGNOSIS — F39 Unspecified mood [affective] disorder: Secondary | ICD-10-CM | POA: Diagnosis not present

## 2015-05-18 DIAGNOSIS — K047 Periapical abscess without sinus: Secondary | ICD-10-CM | POA: Diagnosis not present

## 2015-05-18 DIAGNOSIS — R2689 Other abnormalities of gait and mobility: Secondary | ICD-10-CM | POA: Diagnosis not present

## 2015-05-18 DIAGNOSIS — I5022 Chronic systolic (congestive) heart failure: Secondary | ICD-10-CM | POA: Diagnosis not present

## 2015-05-18 DIAGNOSIS — Z6841 Body Mass Index (BMI) 40.0 and over, adult: Secondary | ICD-10-CM | POA: Diagnosis not present

## 2015-05-18 DIAGNOSIS — R278 Other lack of coordination: Secondary | ICD-10-CM | POA: Diagnosis not present

## 2015-05-18 DIAGNOSIS — R531 Weakness: Secondary | ICD-10-CM | POA: Diagnosis not present

## 2015-05-18 DIAGNOSIS — I739 Peripheral vascular disease, unspecified: Secondary | ICD-10-CM | POA: Diagnosis not present

## 2015-05-18 DIAGNOSIS — E079 Disorder of thyroid, unspecified: Secondary | ICD-10-CM | POA: Diagnosis not present

## 2015-05-18 DIAGNOSIS — A047 Enterocolitis due to Clostridium difficile: Secondary | ICD-10-CM | POA: Diagnosis not present

## 2015-05-18 DIAGNOSIS — R4182 Altered mental status, unspecified: Secondary | ICD-10-CM | POA: Diagnosis not present

## 2015-05-18 DIAGNOSIS — E876 Hypokalemia: Secondary | ICD-10-CM | POA: Diagnosis not present

## 2015-05-18 DIAGNOSIS — R402411 Glasgow coma scale score 13-15, in the field [EMT or ambulance]: Secondary | ICD-10-CM | POA: Diagnosis not present

## 2015-05-18 DIAGNOSIS — R42 Dizziness and giddiness: Secondary | ICD-10-CM | POA: Diagnosis not present

## 2015-05-18 DIAGNOSIS — R05 Cough: Secondary | ICD-10-CM | POA: Diagnosis not present

## 2015-05-18 DIAGNOSIS — R0989 Other specified symptoms and signs involving the circulatory and respiratory systems: Secondary | ICD-10-CM | POA: Diagnosis not present

## 2015-05-18 DIAGNOSIS — F419 Anxiety disorder, unspecified: Secondary | ICD-10-CM | POA: Diagnosis not present

## 2015-05-18 DIAGNOSIS — R4189 Other symptoms and signs involving cognitive functions and awareness: Secondary | ICD-10-CM | POA: Diagnosis not present

## 2015-05-18 DIAGNOSIS — N390 Urinary tract infection, site not specified: Secondary | ICD-10-CM | POA: Diagnosis not present

## 2015-05-18 DIAGNOSIS — K5900 Constipation, unspecified: Secondary | ICD-10-CM | POA: Diagnosis not present

## 2015-05-18 DIAGNOSIS — F29 Unspecified psychosis not due to a substance or known physiological condition: Secondary | ICD-10-CM | POA: Diagnosis not present

## 2015-05-18 DIAGNOSIS — M6281 Muscle weakness (generalized): Secondary | ICD-10-CM | POA: Diagnosis not present

## 2015-05-18 DIAGNOSIS — R279 Unspecified lack of coordination: Secondary | ICD-10-CM | POA: Diagnosis not present

## 2015-05-18 DIAGNOSIS — I7389 Other specified peripheral vascular diseases: Secondary | ICD-10-CM | POA: Diagnosis not present

## 2015-05-18 DIAGNOSIS — F418 Other specified anxiety disorders: Secondary | ICD-10-CM | POA: Diagnosis not present

## 2015-05-18 DIAGNOSIS — E6609 Other obesity due to excess calories: Secondary | ICD-10-CM | POA: Diagnosis not present

## 2015-05-18 DIAGNOSIS — Z7401 Bed confinement status: Secondary | ICD-10-CM | POA: Diagnosis not present

## 2015-05-18 DIAGNOSIS — M545 Low back pain: Secondary | ICD-10-CM | POA: Diagnosis not present

## 2015-05-18 DIAGNOSIS — I10 Essential (primary) hypertension: Secondary | ICD-10-CM | POA: Diagnosis not present

## 2015-05-18 DIAGNOSIS — K219 Gastro-esophageal reflux disease without esophagitis: Secondary | ICD-10-CM | POA: Diagnosis not present

## 2015-05-18 DIAGNOSIS — F339 Major depressive disorder, recurrent, unspecified: Secondary | ICD-10-CM | POA: Diagnosis not present

## 2015-05-18 DIAGNOSIS — I5032 Chronic diastolic (congestive) heart failure: Secondary | ICD-10-CM | POA: Diagnosis not present

## 2015-06-20 DIAGNOSIS — K219 Gastro-esophageal reflux disease without esophagitis: Secondary | ICD-10-CM | POA: Diagnosis not present

## 2015-06-20 DIAGNOSIS — J441 Chronic obstructive pulmonary disease with (acute) exacerbation: Secondary | ICD-10-CM | POA: Diagnosis not present

## 2015-06-20 DIAGNOSIS — I5022 Chronic systolic (congestive) heart failure: Secondary | ICD-10-CM | POA: Diagnosis not present

## 2015-06-22 DIAGNOSIS — F39 Unspecified mood [affective] disorder: Secondary | ICD-10-CM | POA: Diagnosis not present

## 2015-06-22 DIAGNOSIS — F419 Anxiety disorder, unspecified: Secondary | ICD-10-CM | POA: Diagnosis not present

## 2015-06-22 DIAGNOSIS — F29 Unspecified psychosis not due to a substance or known physiological condition: Secondary | ICD-10-CM | POA: Diagnosis not present

## 2015-07-11 DIAGNOSIS — F39 Unspecified mood [affective] disorder: Secondary | ICD-10-CM | POA: Diagnosis not present

## 2015-07-11 DIAGNOSIS — F419 Anxiety disorder, unspecified: Secondary | ICD-10-CM | POA: Diagnosis not present

## 2015-07-11 DIAGNOSIS — F29 Unspecified psychosis not due to a substance or known physiological condition: Secondary | ICD-10-CM | POA: Diagnosis not present

## 2015-07-25 DIAGNOSIS — F39 Unspecified mood [affective] disorder: Secondary | ICD-10-CM | POA: Diagnosis not present

## 2015-07-25 DIAGNOSIS — F419 Anxiety disorder, unspecified: Secondary | ICD-10-CM | POA: Diagnosis not present

## 2015-07-25 DIAGNOSIS — F29 Unspecified psychosis not due to a substance or known physiological condition: Secondary | ICD-10-CM | POA: Diagnosis not present

## 2015-07-27 DIAGNOSIS — F331 Major depressive disorder, recurrent, moderate: Secondary | ICD-10-CM | POA: Diagnosis not present

## 2015-07-29 DIAGNOSIS — F331 Major depressive disorder, recurrent, moderate: Secondary | ICD-10-CM | POA: Diagnosis not present

## 2015-07-31 DIAGNOSIS — F331 Major depressive disorder, recurrent, moderate: Secondary | ICD-10-CM | POA: Diagnosis not present

## 2015-08-02 DIAGNOSIS — F331 Major depressive disorder, recurrent, moderate: Secondary | ICD-10-CM | POA: Diagnosis not present

## 2015-08-03 DIAGNOSIS — K219 Gastro-esophageal reflux disease without esophagitis: Secondary | ICD-10-CM | POA: Diagnosis not present

## 2015-08-03 DIAGNOSIS — I5022 Chronic systolic (congestive) heart failure: Secondary | ICD-10-CM | POA: Diagnosis not present

## 2015-08-03 DIAGNOSIS — J441 Chronic obstructive pulmonary disease with (acute) exacerbation: Secondary | ICD-10-CM | POA: Diagnosis not present

## 2015-08-06 DIAGNOSIS — F331 Major depressive disorder, recurrent, moderate: Secondary | ICD-10-CM | POA: Diagnosis not present

## 2015-08-10 DIAGNOSIS — F331 Major depressive disorder, recurrent, moderate: Secondary | ICD-10-CM | POA: Diagnosis not present

## 2015-08-13 DIAGNOSIS — F331 Major depressive disorder, recurrent, moderate: Secondary | ICD-10-CM | POA: Diagnosis not present

## 2015-08-16 DIAGNOSIS — H2513 Age-related nuclear cataract, bilateral: Secondary | ICD-10-CM | POA: Diagnosis not present

## 2015-08-16 DIAGNOSIS — F419 Anxiety disorder, unspecified: Secondary | ICD-10-CM | POA: Diagnosis not present

## 2015-08-16 DIAGNOSIS — F39 Unspecified mood [affective] disorder: Secondary | ICD-10-CM | POA: Diagnosis not present

## 2015-08-16 DIAGNOSIS — F331 Major depressive disorder, recurrent, moderate: Secondary | ICD-10-CM | POA: Diagnosis not present

## 2015-08-16 DIAGNOSIS — F29 Unspecified psychosis not due to a substance or known physiological condition: Secondary | ICD-10-CM | POA: Diagnosis not present

## 2015-08-16 DIAGNOSIS — H538 Other visual disturbances: Secondary | ICD-10-CM | POA: Diagnosis not present

## 2015-08-17 DIAGNOSIS — M62838 Other muscle spasm: Secondary | ICD-10-CM | POA: Diagnosis not present

## 2015-08-17 DIAGNOSIS — R2689 Other abnormalities of gait and mobility: Secondary | ICD-10-CM | POA: Diagnosis not present

## 2015-08-17 DIAGNOSIS — M170 Bilateral primary osteoarthritis of knee: Secondary | ICD-10-CM | POA: Diagnosis not present

## 2015-08-24 DIAGNOSIS — R279 Unspecified lack of coordination: Secondary | ICD-10-CM | POA: Diagnosis not present

## 2015-08-24 DIAGNOSIS — I5032 Chronic diastolic (congestive) heart failure: Secondary | ICD-10-CM | POA: Diagnosis not present

## 2015-08-24 DIAGNOSIS — R2689 Other abnormalities of gait and mobility: Secondary | ICD-10-CM | POA: Diagnosis not present

## 2015-08-24 DIAGNOSIS — R531 Weakness: Secondary | ICD-10-CM | POA: Diagnosis not present

## 2015-08-24 DIAGNOSIS — M6281 Muscle weakness (generalized): Secondary | ICD-10-CM | POA: Diagnosis not present

## 2015-08-24 DIAGNOSIS — R278 Other lack of coordination: Secondary | ICD-10-CM | POA: Diagnosis not present

## 2015-08-25 DIAGNOSIS — R2689 Other abnormalities of gait and mobility: Secondary | ICD-10-CM | POA: Diagnosis not present

## 2015-08-25 DIAGNOSIS — M6281 Muscle weakness (generalized): Secondary | ICD-10-CM | POA: Diagnosis not present

## 2015-08-25 DIAGNOSIS — R279 Unspecified lack of coordination: Secondary | ICD-10-CM | POA: Diagnosis not present

## 2015-08-25 DIAGNOSIS — R278 Other lack of coordination: Secondary | ICD-10-CM | POA: Diagnosis not present

## 2015-08-25 DIAGNOSIS — R531 Weakness: Secondary | ICD-10-CM | POA: Diagnosis not present

## 2015-08-25 DIAGNOSIS — I5032 Chronic diastolic (congestive) heart failure: Secondary | ICD-10-CM | POA: Diagnosis not present

## 2015-08-27 DIAGNOSIS — F331 Major depressive disorder, recurrent, moderate: Secondary | ICD-10-CM | POA: Diagnosis not present

## 2015-08-28 DIAGNOSIS — R2689 Other abnormalities of gait and mobility: Secondary | ICD-10-CM | POA: Diagnosis not present

## 2015-08-28 DIAGNOSIS — R278 Other lack of coordination: Secondary | ICD-10-CM | POA: Diagnosis not present

## 2015-08-28 DIAGNOSIS — R531 Weakness: Secondary | ICD-10-CM | POA: Diagnosis not present

## 2015-08-28 DIAGNOSIS — R279 Unspecified lack of coordination: Secondary | ICD-10-CM | POA: Diagnosis not present

## 2015-08-28 DIAGNOSIS — I5032 Chronic diastolic (congestive) heart failure: Secondary | ICD-10-CM | POA: Diagnosis not present

## 2015-08-28 DIAGNOSIS — M6281 Muscle weakness (generalized): Secondary | ICD-10-CM | POA: Diagnosis not present

## 2015-08-29 DIAGNOSIS — R2689 Other abnormalities of gait and mobility: Secondary | ICD-10-CM | POA: Diagnosis not present

## 2015-08-29 DIAGNOSIS — R279 Unspecified lack of coordination: Secondary | ICD-10-CM | POA: Diagnosis not present

## 2015-08-29 DIAGNOSIS — N390 Urinary tract infection, site not specified: Secondary | ICD-10-CM | POA: Diagnosis not present

## 2015-08-29 DIAGNOSIS — R278 Other lack of coordination: Secondary | ICD-10-CM | POA: Diagnosis not present

## 2015-08-29 DIAGNOSIS — M6281 Muscle weakness (generalized): Secondary | ICD-10-CM | POA: Diagnosis not present

## 2015-08-29 DIAGNOSIS — R531 Weakness: Secondary | ICD-10-CM | POA: Diagnosis not present

## 2015-08-29 DIAGNOSIS — I5032 Chronic diastolic (congestive) heart failure: Secondary | ICD-10-CM | POA: Diagnosis not present

## 2015-08-29 DIAGNOSIS — F331 Major depressive disorder, recurrent, moderate: Secondary | ICD-10-CM | POA: Diagnosis not present

## 2015-08-30 DIAGNOSIS — I5032 Chronic diastolic (congestive) heart failure: Secondary | ICD-10-CM | POA: Diagnosis not present

## 2015-08-30 DIAGNOSIS — M6281 Muscle weakness (generalized): Secondary | ICD-10-CM | POA: Diagnosis not present

## 2015-08-30 DIAGNOSIS — R2689 Other abnormalities of gait and mobility: Secondary | ICD-10-CM | POA: Diagnosis not present

## 2015-08-30 DIAGNOSIS — R278 Other lack of coordination: Secondary | ICD-10-CM | POA: Diagnosis not present

## 2015-08-30 DIAGNOSIS — R279 Unspecified lack of coordination: Secondary | ICD-10-CM | POA: Diagnosis not present

## 2015-08-30 DIAGNOSIS — R531 Weakness: Secondary | ICD-10-CM | POA: Diagnosis not present

## 2015-08-31 DIAGNOSIS — R279 Unspecified lack of coordination: Secondary | ICD-10-CM | POA: Diagnosis not present

## 2015-08-31 DIAGNOSIS — R278 Other lack of coordination: Secondary | ICD-10-CM | POA: Diagnosis not present

## 2015-08-31 DIAGNOSIS — R531 Weakness: Secondary | ICD-10-CM | POA: Diagnosis not present

## 2015-08-31 DIAGNOSIS — R2689 Other abnormalities of gait and mobility: Secondary | ICD-10-CM | POA: Diagnosis not present

## 2015-08-31 DIAGNOSIS — M6281 Muscle weakness (generalized): Secondary | ICD-10-CM | POA: Diagnosis not present

## 2015-08-31 DIAGNOSIS — I5032 Chronic diastolic (congestive) heart failure: Secondary | ICD-10-CM | POA: Diagnosis not present

## 2015-09-01 DIAGNOSIS — M6281 Muscle weakness (generalized): Secondary | ICD-10-CM | POA: Diagnosis not present

## 2015-09-01 DIAGNOSIS — R278 Other lack of coordination: Secondary | ICD-10-CM | POA: Diagnosis not present

## 2015-09-01 DIAGNOSIS — R2689 Other abnormalities of gait and mobility: Secondary | ICD-10-CM | POA: Diagnosis not present

## 2015-09-01 DIAGNOSIS — I5032 Chronic diastolic (congestive) heart failure: Secondary | ICD-10-CM | POA: Diagnosis not present

## 2015-09-01 DIAGNOSIS — R531 Weakness: Secondary | ICD-10-CM | POA: Diagnosis not present

## 2015-09-01 DIAGNOSIS — R279 Unspecified lack of coordination: Secondary | ICD-10-CM | POA: Diagnosis not present

## 2015-09-04 DIAGNOSIS — R531 Weakness: Secondary | ICD-10-CM | POA: Diagnosis not present

## 2015-09-04 DIAGNOSIS — M6281 Muscle weakness (generalized): Secondary | ICD-10-CM | POA: Diagnosis not present

## 2015-09-04 DIAGNOSIS — R279 Unspecified lack of coordination: Secondary | ICD-10-CM | POA: Diagnosis not present

## 2015-09-04 DIAGNOSIS — R2689 Other abnormalities of gait and mobility: Secondary | ICD-10-CM | POA: Diagnosis not present

## 2015-09-04 DIAGNOSIS — I5032 Chronic diastolic (congestive) heart failure: Secondary | ICD-10-CM | POA: Diagnosis not present

## 2015-09-04 DIAGNOSIS — R278 Other lack of coordination: Secondary | ICD-10-CM | POA: Diagnosis not present

## 2015-09-05 DIAGNOSIS — R2689 Other abnormalities of gait and mobility: Secondary | ICD-10-CM | POA: Diagnosis not present

## 2015-09-05 DIAGNOSIS — R279 Unspecified lack of coordination: Secondary | ICD-10-CM | POA: Diagnosis not present

## 2015-09-05 DIAGNOSIS — R278 Other lack of coordination: Secondary | ICD-10-CM | POA: Diagnosis not present

## 2015-09-05 DIAGNOSIS — M6281 Muscle weakness (generalized): Secondary | ICD-10-CM | POA: Diagnosis not present

## 2015-09-05 DIAGNOSIS — R531 Weakness: Secondary | ICD-10-CM | POA: Diagnosis not present

## 2015-09-05 DIAGNOSIS — I5032 Chronic diastolic (congestive) heart failure: Secondary | ICD-10-CM | POA: Diagnosis not present

## 2015-09-06 DIAGNOSIS — R531 Weakness: Secondary | ICD-10-CM | POA: Diagnosis not present

## 2015-09-06 DIAGNOSIS — I5032 Chronic diastolic (congestive) heart failure: Secondary | ICD-10-CM | POA: Diagnosis not present

## 2015-09-06 DIAGNOSIS — F331 Major depressive disorder, recurrent, moderate: Secondary | ICD-10-CM | POA: Diagnosis not present

## 2015-09-06 DIAGNOSIS — R278 Other lack of coordination: Secondary | ICD-10-CM | POA: Diagnosis not present

## 2015-09-06 DIAGNOSIS — R2689 Other abnormalities of gait and mobility: Secondary | ICD-10-CM | POA: Diagnosis not present

## 2015-09-06 DIAGNOSIS — M6281 Muscle weakness (generalized): Secondary | ICD-10-CM | POA: Diagnosis not present

## 2015-09-06 DIAGNOSIS — R279 Unspecified lack of coordination: Secondary | ICD-10-CM | POA: Diagnosis not present

## 2015-09-07 DIAGNOSIS — M6281 Muscle weakness (generalized): Secondary | ICD-10-CM | POA: Diagnosis not present

## 2015-09-07 DIAGNOSIS — R278 Other lack of coordination: Secondary | ICD-10-CM | POA: Diagnosis not present

## 2015-09-07 DIAGNOSIS — R279 Unspecified lack of coordination: Secondary | ICD-10-CM | POA: Diagnosis not present

## 2015-09-07 DIAGNOSIS — I5032 Chronic diastolic (congestive) heart failure: Secondary | ICD-10-CM | POA: Diagnosis not present

## 2015-09-07 DIAGNOSIS — R2689 Other abnormalities of gait and mobility: Secondary | ICD-10-CM | POA: Diagnosis not present

## 2015-09-07 DIAGNOSIS — R531 Weakness: Secondary | ICD-10-CM | POA: Diagnosis not present

## 2015-09-08 DIAGNOSIS — M6281 Muscle weakness (generalized): Secondary | ICD-10-CM | POA: Diagnosis not present

## 2015-09-08 DIAGNOSIS — R2689 Other abnormalities of gait and mobility: Secondary | ICD-10-CM | POA: Diagnosis not present

## 2015-09-08 DIAGNOSIS — R531 Weakness: Secondary | ICD-10-CM | POA: Diagnosis not present

## 2015-09-08 DIAGNOSIS — R279 Unspecified lack of coordination: Secondary | ICD-10-CM | POA: Diagnosis not present

## 2015-09-08 DIAGNOSIS — I5032 Chronic diastolic (congestive) heart failure: Secondary | ICD-10-CM | POA: Diagnosis not present

## 2015-09-08 DIAGNOSIS — R278 Other lack of coordination: Secondary | ICD-10-CM | POA: Diagnosis not present

## 2015-09-09 DIAGNOSIS — F331 Major depressive disorder, recurrent, moderate: Secondary | ICD-10-CM | POA: Diagnosis not present

## 2015-09-11 DIAGNOSIS — R531 Weakness: Secondary | ICD-10-CM | POA: Diagnosis not present

## 2015-09-11 DIAGNOSIS — M6281 Muscle weakness (generalized): Secondary | ICD-10-CM | POA: Diagnosis not present

## 2015-09-11 DIAGNOSIS — R278 Other lack of coordination: Secondary | ICD-10-CM | POA: Diagnosis not present

## 2015-09-11 DIAGNOSIS — I5032 Chronic diastolic (congestive) heart failure: Secondary | ICD-10-CM | POA: Diagnosis not present

## 2015-09-11 DIAGNOSIS — R2689 Other abnormalities of gait and mobility: Secondary | ICD-10-CM | POA: Diagnosis not present

## 2015-09-11 DIAGNOSIS — R279 Unspecified lack of coordination: Secondary | ICD-10-CM | POA: Diagnosis not present

## 2015-09-12 DIAGNOSIS — R278 Other lack of coordination: Secondary | ICD-10-CM | POA: Diagnosis not present

## 2015-09-12 DIAGNOSIS — R2689 Other abnormalities of gait and mobility: Secondary | ICD-10-CM | POA: Diagnosis not present

## 2015-09-12 DIAGNOSIS — R531 Weakness: Secondary | ICD-10-CM | POA: Diagnosis not present

## 2015-09-12 DIAGNOSIS — F331 Major depressive disorder, recurrent, moderate: Secondary | ICD-10-CM | POA: Diagnosis not present

## 2015-09-12 DIAGNOSIS — R279 Unspecified lack of coordination: Secondary | ICD-10-CM | POA: Diagnosis not present

## 2015-09-12 DIAGNOSIS — M6281 Muscle weakness (generalized): Secondary | ICD-10-CM | POA: Diagnosis not present

## 2015-09-12 DIAGNOSIS — I5032 Chronic diastolic (congestive) heart failure: Secondary | ICD-10-CM | POA: Diagnosis not present

## 2015-09-13 DIAGNOSIS — I5032 Chronic diastolic (congestive) heart failure: Secondary | ICD-10-CM | POA: Diagnosis not present

## 2015-09-13 DIAGNOSIS — R279 Unspecified lack of coordination: Secondary | ICD-10-CM | POA: Diagnosis not present

## 2015-09-13 DIAGNOSIS — M6281 Muscle weakness (generalized): Secondary | ICD-10-CM | POA: Diagnosis not present

## 2015-09-13 DIAGNOSIS — R2689 Other abnormalities of gait and mobility: Secondary | ICD-10-CM | POA: Diagnosis not present

## 2015-09-13 DIAGNOSIS — R531 Weakness: Secondary | ICD-10-CM | POA: Diagnosis not present

## 2015-09-13 DIAGNOSIS — R278 Other lack of coordination: Secondary | ICD-10-CM | POA: Diagnosis not present

## 2015-09-14 DIAGNOSIS — I5032 Chronic diastolic (congestive) heart failure: Secondary | ICD-10-CM | POA: Diagnosis not present

## 2015-09-14 DIAGNOSIS — R531 Weakness: Secondary | ICD-10-CM | POA: Diagnosis not present

## 2015-09-14 DIAGNOSIS — M6281 Muscle weakness (generalized): Secondary | ICD-10-CM | POA: Diagnosis not present

## 2015-09-14 DIAGNOSIS — R278 Other lack of coordination: Secondary | ICD-10-CM | POA: Diagnosis not present

## 2015-09-14 DIAGNOSIS — R2689 Other abnormalities of gait and mobility: Secondary | ICD-10-CM | POA: Diagnosis not present

## 2015-09-14 DIAGNOSIS — R279 Unspecified lack of coordination: Secondary | ICD-10-CM | POA: Diagnosis not present

## 2015-09-15 DIAGNOSIS — M6281 Muscle weakness (generalized): Secondary | ICD-10-CM | POA: Diagnosis not present

## 2015-09-15 DIAGNOSIS — R2689 Other abnormalities of gait and mobility: Secondary | ICD-10-CM | POA: Diagnosis not present

## 2015-09-15 DIAGNOSIS — I5032 Chronic diastolic (congestive) heart failure: Secondary | ICD-10-CM | POA: Diagnosis not present

## 2015-09-15 DIAGNOSIS — R531 Weakness: Secondary | ICD-10-CM | POA: Diagnosis not present

## 2015-09-15 DIAGNOSIS — R279 Unspecified lack of coordination: Secondary | ICD-10-CM | POA: Diagnosis not present

## 2015-09-15 DIAGNOSIS — R278 Other lack of coordination: Secondary | ICD-10-CM | POA: Diagnosis not present

## 2015-09-16 DIAGNOSIS — F331 Major depressive disorder, recurrent, moderate: Secondary | ICD-10-CM | POA: Diagnosis not present

## 2015-09-18 DIAGNOSIS — R2689 Other abnormalities of gait and mobility: Secondary | ICD-10-CM | POA: Diagnosis not present

## 2015-09-18 DIAGNOSIS — I5032 Chronic diastolic (congestive) heart failure: Secondary | ICD-10-CM | POA: Diagnosis not present

## 2015-09-18 DIAGNOSIS — M6281 Muscle weakness (generalized): Secondary | ICD-10-CM | POA: Diagnosis not present

## 2015-09-18 DIAGNOSIS — R278 Other lack of coordination: Secondary | ICD-10-CM | POA: Diagnosis not present

## 2015-09-18 DIAGNOSIS — R531 Weakness: Secondary | ICD-10-CM | POA: Diagnosis not present

## 2015-09-18 DIAGNOSIS — F331 Major depressive disorder, recurrent, moderate: Secondary | ICD-10-CM | POA: Diagnosis not present

## 2015-09-18 DIAGNOSIS — R279 Unspecified lack of coordination: Secondary | ICD-10-CM | POA: Diagnosis not present

## 2015-09-19 DIAGNOSIS — R278 Other lack of coordination: Secondary | ICD-10-CM | POA: Diagnosis not present

## 2015-09-19 DIAGNOSIS — I5032 Chronic diastolic (congestive) heart failure: Secondary | ICD-10-CM | POA: Diagnosis not present

## 2015-09-19 DIAGNOSIS — R531 Weakness: Secondary | ICD-10-CM | POA: Diagnosis not present

## 2015-09-19 DIAGNOSIS — M6281 Muscle weakness (generalized): Secondary | ICD-10-CM | POA: Diagnosis not present

## 2015-09-19 DIAGNOSIS — R279 Unspecified lack of coordination: Secondary | ICD-10-CM | POA: Diagnosis not present

## 2015-09-19 DIAGNOSIS — R2689 Other abnormalities of gait and mobility: Secondary | ICD-10-CM | POA: Diagnosis not present

## 2015-09-20 DIAGNOSIS — R278 Other lack of coordination: Secondary | ICD-10-CM | POA: Diagnosis not present

## 2015-09-20 DIAGNOSIS — I5032 Chronic diastolic (congestive) heart failure: Secondary | ICD-10-CM | POA: Diagnosis not present

## 2015-09-20 DIAGNOSIS — R279 Unspecified lack of coordination: Secondary | ICD-10-CM | POA: Diagnosis not present

## 2015-09-20 DIAGNOSIS — M6281 Muscle weakness (generalized): Secondary | ICD-10-CM | POA: Diagnosis not present

## 2015-09-20 DIAGNOSIS — R531 Weakness: Secondary | ICD-10-CM | POA: Diagnosis not present

## 2015-09-20 DIAGNOSIS — R2689 Other abnormalities of gait and mobility: Secondary | ICD-10-CM | POA: Diagnosis not present

## 2015-09-21 DIAGNOSIS — R2689 Other abnormalities of gait and mobility: Secondary | ICD-10-CM | POA: Diagnosis not present

## 2015-09-21 DIAGNOSIS — R531 Weakness: Secondary | ICD-10-CM | POA: Diagnosis not present

## 2015-09-21 DIAGNOSIS — I5032 Chronic diastolic (congestive) heart failure: Secondary | ICD-10-CM | POA: Diagnosis not present

## 2015-09-21 DIAGNOSIS — R279 Unspecified lack of coordination: Secondary | ICD-10-CM | POA: Diagnosis not present

## 2015-09-21 DIAGNOSIS — R278 Other lack of coordination: Secondary | ICD-10-CM | POA: Diagnosis not present

## 2015-09-21 DIAGNOSIS — M6281 Muscle weakness (generalized): Secondary | ICD-10-CM | POA: Diagnosis not present

## 2015-09-22 DIAGNOSIS — K219 Gastro-esophageal reflux disease without esophagitis: Secondary | ICD-10-CM | POA: Diagnosis not present

## 2015-09-22 DIAGNOSIS — R531 Weakness: Secondary | ICD-10-CM | POA: Diagnosis not present

## 2015-09-22 DIAGNOSIS — R278 Other lack of coordination: Secondary | ICD-10-CM | POA: Diagnosis not present

## 2015-09-22 DIAGNOSIS — R279 Unspecified lack of coordination: Secondary | ICD-10-CM | POA: Diagnosis not present

## 2015-09-22 DIAGNOSIS — R2689 Other abnormalities of gait and mobility: Secondary | ICD-10-CM | POA: Diagnosis not present

## 2015-09-22 DIAGNOSIS — M6281 Muscle weakness (generalized): Secondary | ICD-10-CM | POA: Diagnosis not present

## 2015-09-22 DIAGNOSIS — I5022 Chronic systolic (congestive) heart failure: Secondary | ICD-10-CM | POA: Diagnosis not present

## 2015-09-22 DIAGNOSIS — I5032 Chronic diastolic (congestive) heart failure: Secondary | ICD-10-CM | POA: Diagnosis not present

## 2015-09-22 DIAGNOSIS — J441 Chronic obstructive pulmonary disease with (acute) exacerbation: Secondary | ICD-10-CM | POA: Diagnosis not present

## 2015-09-23 DIAGNOSIS — F331 Major depressive disorder, recurrent, moderate: Secondary | ICD-10-CM | POA: Diagnosis not present

## 2015-09-25 DIAGNOSIS — R531 Weakness: Secondary | ICD-10-CM | POA: Diagnosis not present

## 2015-09-25 DIAGNOSIS — R279 Unspecified lack of coordination: Secondary | ICD-10-CM | POA: Diagnosis not present

## 2015-09-25 DIAGNOSIS — F331 Major depressive disorder, recurrent, moderate: Secondary | ICD-10-CM | POA: Diagnosis not present

## 2015-09-25 DIAGNOSIS — R2689 Other abnormalities of gait and mobility: Secondary | ICD-10-CM | POA: Diagnosis not present

## 2015-09-25 DIAGNOSIS — I5032 Chronic diastolic (congestive) heart failure: Secondary | ICD-10-CM | POA: Diagnosis not present

## 2015-09-25 DIAGNOSIS — M6281 Muscle weakness (generalized): Secondary | ICD-10-CM | POA: Diagnosis not present

## 2015-09-25 DIAGNOSIS — R278 Other lack of coordination: Secondary | ICD-10-CM | POA: Diagnosis not present

## 2015-09-26 DIAGNOSIS — R2689 Other abnormalities of gait and mobility: Secondary | ICD-10-CM | POA: Diagnosis not present

## 2015-09-26 DIAGNOSIS — I739 Peripheral vascular disease, unspecified: Secondary | ICD-10-CM | POA: Diagnosis not present

## 2015-09-26 DIAGNOSIS — R279 Unspecified lack of coordination: Secondary | ICD-10-CM | POA: Diagnosis not present

## 2015-09-26 DIAGNOSIS — I5032 Chronic diastolic (congestive) heart failure: Secondary | ICD-10-CM | POA: Diagnosis not present

## 2015-09-26 DIAGNOSIS — M6281 Muscle weakness (generalized): Secondary | ICD-10-CM | POA: Diagnosis not present

## 2015-09-26 DIAGNOSIS — R531 Weakness: Secondary | ICD-10-CM | POA: Diagnosis not present

## 2015-09-26 DIAGNOSIS — R278 Other lack of coordination: Secondary | ICD-10-CM | POA: Diagnosis not present

## 2015-09-27 DIAGNOSIS — I5032 Chronic diastolic (congestive) heart failure: Secondary | ICD-10-CM | POA: Diagnosis not present

## 2015-09-27 DIAGNOSIS — R2689 Other abnormalities of gait and mobility: Secondary | ICD-10-CM | POA: Diagnosis not present

## 2015-09-27 DIAGNOSIS — R531 Weakness: Secondary | ICD-10-CM | POA: Diagnosis not present

## 2015-09-27 DIAGNOSIS — R278 Other lack of coordination: Secondary | ICD-10-CM | POA: Diagnosis not present

## 2015-09-27 DIAGNOSIS — M6281 Muscle weakness (generalized): Secondary | ICD-10-CM | POA: Diagnosis not present

## 2015-09-27 DIAGNOSIS — R279 Unspecified lack of coordination: Secondary | ICD-10-CM | POA: Diagnosis not present

## 2015-09-28 DIAGNOSIS — M6281 Muscle weakness (generalized): Secondary | ICD-10-CM | POA: Diagnosis not present

## 2015-09-28 DIAGNOSIS — R531 Weakness: Secondary | ICD-10-CM | POA: Diagnosis not present

## 2015-09-28 DIAGNOSIS — I5032 Chronic diastolic (congestive) heart failure: Secondary | ICD-10-CM | POA: Diagnosis not present

## 2015-09-28 DIAGNOSIS — R278 Other lack of coordination: Secondary | ICD-10-CM | POA: Diagnosis not present

## 2015-09-28 DIAGNOSIS — R279 Unspecified lack of coordination: Secondary | ICD-10-CM | POA: Diagnosis not present

## 2015-09-28 DIAGNOSIS — R2689 Other abnormalities of gait and mobility: Secondary | ICD-10-CM | POA: Diagnosis not present

## 2015-09-29 DIAGNOSIS — M6281 Muscle weakness (generalized): Secondary | ICD-10-CM | POA: Diagnosis not present

## 2015-09-29 DIAGNOSIS — I5032 Chronic diastolic (congestive) heart failure: Secondary | ICD-10-CM | POA: Diagnosis not present

## 2015-09-29 DIAGNOSIS — R531 Weakness: Secondary | ICD-10-CM | POA: Diagnosis not present

## 2015-09-29 DIAGNOSIS — R279 Unspecified lack of coordination: Secondary | ICD-10-CM | POA: Diagnosis not present

## 2015-09-29 DIAGNOSIS — R2689 Other abnormalities of gait and mobility: Secondary | ICD-10-CM | POA: Diagnosis not present

## 2015-09-29 DIAGNOSIS — R278 Other lack of coordination: Secondary | ICD-10-CM | POA: Diagnosis not present

## 2015-09-30 DIAGNOSIS — R279 Unspecified lack of coordination: Secondary | ICD-10-CM | POA: Diagnosis not present

## 2015-09-30 DIAGNOSIS — R2689 Other abnormalities of gait and mobility: Secondary | ICD-10-CM | POA: Diagnosis not present

## 2015-09-30 DIAGNOSIS — I5032 Chronic diastolic (congestive) heart failure: Secondary | ICD-10-CM | POA: Diagnosis not present

## 2015-09-30 DIAGNOSIS — R278 Other lack of coordination: Secondary | ICD-10-CM | POA: Diagnosis not present

## 2015-09-30 DIAGNOSIS — R531 Weakness: Secondary | ICD-10-CM | POA: Diagnosis not present

## 2015-09-30 DIAGNOSIS — M6281 Muscle weakness (generalized): Secondary | ICD-10-CM | POA: Diagnosis not present

## 2015-10-02 DIAGNOSIS — I5032 Chronic diastolic (congestive) heart failure: Secondary | ICD-10-CM | POA: Diagnosis not present

## 2015-10-02 DIAGNOSIS — F331 Major depressive disorder, recurrent, moderate: Secondary | ICD-10-CM | POA: Diagnosis not present

## 2015-10-02 DIAGNOSIS — R278 Other lack of coordination: Secondary | ICD-10-CM | POA: Diagnosis not present

## 2015-10-02 DIAGNOSIS — R531 Weakness: Secondary | ICD-10-CM | POA: Diagnosis not present

## 2015-10-02 DIAGNOSIS — R2689 Other abnormalities of gait and mobility: Secondary | ICD-10-CM | POA: Diagnosis not present

## 2015-10-02 DIAGNOSIS — R279 Unspecified lack of coordination: Secondary | ICD-10-CM | POA: Diagnosis not present

## 2015-10-02 DIAGNOSIS — M6281 Muscle weakness (generalized): Secondary | ICD-10-CM | POA: Diagnosis not present

## 2015-10-03 DIAGNOSIS — R279 Unspecified lack of coordination: Secondary | ICD-10-CM | POA: Diagnosis not present

## 2015-10-03 DIAGNOSIS — M6281 Muscle weakness (generalized): Secondary | ICD-10-CM | POA: Diagnosis not present

## 2015-10-03 DIAGNOSIS — R278 Other lack of coordination: Secondary | ICD-10-CM | POA: Diagnosis not present

## 2015-10-03 DIAGNOSIS — I5032 Chronic diastolic (congestive) heart failure: Secondary | ICD-10-CM | POA: Diagnosis not present

## 2015-10-03 DIAGNOSIS — R531 Weakness: Secondary | ICD-10-CM | POA: Diagnosis not present

## 2015-10-03 DIAGNOSIS — R2689 Other abnormalities of gait and mobility: Secondary | ICD-10-CM | POA: Diagnosis not present

## 2015-10-04 DIAGNOSIS — I5032 Chronic diastolic (congestive) heart failure: Secondary | ICD-10-CM | POA: Diagnosis not present

## 2015-10-04 DIAGNOSIS — R531 Weakness: Secondary | ICD-10-CM | POA: Diagnosis not present

## 2015-10-04 DIAGNOSIS — R2689 Other abnormalities of gait and mobility: Secondary | ICD-10-CM | POA: Diagnosis not present

## 2015-10-04 DIAGNOSIS — R279 Unspecified lack of coordination: Secondary | ICD-10-CM | POA: Diagnosis not present

## 2015-10-04 DIAGNOSIS — R278 Other lack of coordination: Secondary | ICD-10-CM | POA: Diagnosis not present

## 2015-10-04 DIAGNOSIS — M6281 Muscle weakness (generalized): Secondary | ICD-10-CM | POA: Diagnosis not present

## 2015-10-05 DIAGNOSIS — R279 Unspecified lack of coordination: Secondary | ICD-10-CM | POA: Diagnosis not present

## 2015-10-05 DIAGNOSIS — R531 Weakness: Secondary | ICD-10-CM | POA: Diagnosis not present

## 2015-10-05 DIAGNOSIS — I5032 Chronic diastolic (congestive) heart failure: Secondary | ICD-10-CM | POA: Diagnosis not present

## 2015-10-05 DIAGNOSIS — R2689 Other abnormalities of gait and mobility: Secondary | ICD-10-CM | POA: Diagnosis not present

## 2015-10-05 DIAGNOSIS — M6281 Muscle weakness (generalized): Secondary | ICD-10-CM | POA: Diagnosis not present

## 2015-10-05 DIAGNOSIS — R278 Other lack of coordination: Secondary | ICD-10-CM | POA: Diagnosis not present

## 2015-10-06 DIAGNOSIS — R2689 Other abnormalities of gait and mobility: Secondary | ICD-10-CM | POA: Diagnosis not present

## 2015-10-06 DIAGNOSIS — M6281 Muscle weakness (generalized): Secondary | ICD-10-CM | POA: Diagnosis not present

## 2015-10-06 DIAGNOSIS — R279 Unspecified lack of coordination: Secondary | ICD-10-CM | POA: Diagnosis not present

## 2015-10-06 DIAGNOSIS — I5032 Chronic diastolic (congestive) heart failure: Secondary | ICD-10-CM | POA: Diagnosis not present

## 2015-10-06 DIAGNOSIS — R278 Other lack of coordination: Secondary | ICD-10-CM | POA: Diagnosis not present

## 2015-10-06 DIAGNOSIS — R531 Weakness: Secondary | ICD-10-CM | POA: Diagnosis not present

## 2015-10-07 DIAGNOSIS — R278 Other lack of coordination: Secondary | ICD-10-CM | POA: Diagnosis not present

## 2015-10-07 DIAGNOSIS — R531 Weakness: Secondary | ICD-10-CM | POA: Diagnosis not present

## 2015-10-07 DIAGNOSIS — R2689 Other abnormalities of gait and mobility: Secondary | ICD-10-CM | POA: Diagnosis not present

## 2015-10-07 DIAGNOSIS — I5032 Chronic diastolic (congestive) heart failure: Secondary | ICD-10-CM | POA: Diagnosis not present

## 2015-10-07 DIAGNOSIS — M6281 Muscle weakness (generalized): Secondary | ICD-10-CM | POA: Diagnosis not present

## 2015-10-07 DIAGNOSIS — R279 Unspecified lack of coordination: Secondary | ICD-10-CM | POA: Diagnosis not present

## 2015-10-09 DIAGNOSIS — M6281 Muscle weakness (generalized): Secondary | ICD-10-CM | POA: Diagnosis not present

## 2015-10-09 DIAGNOSIS — R279 Unspecified lack of coordination: Secondary | ICD-10-CM | POA: Diagnosis not present

## 2015-10-09 DIAGNOSIS — R278 Other lack of coordination: Secondary | ICD-10-CM | POA: Diagnosis not present

## 2015-10-09 DIAGNOSIS — I5032 Chronic diastolic (congestive) heart failure: Secondary | ICD-10-CM | POA: Diagnosis not present

## 2015-10-09 DIAGNOSIS — R531 Weakness: Secondary | ICD-10-CM | POA: Diagnosis not present

## 2015-10-09 DIAGNOSIS — R2689 Other abnormalities of gait and mobility: Secondary | ICD-10-CM | POA: Diagnosis not present

## 2015-10-10 DIAGNOSIS — R278 Other lack of coordination: Secondary | ICD-10-CM | POA: Diagnosis not present

## 2015-10-10 DIAGNOSIS — M6281 Muscle weakness (generalized): Secondary | ICD-10-CM | POA: Diagnosis not present

## 2015-10-10 DIAGNOSIS — R279 Unspecified lack of coordination: Secondary | ICD-10-CM | POA: Diagnosis not present

## 2015-10-10 DIAGNOSIS — R2689 Other abnormalities of gait and mobility: Secondary | ICD-10-CM | POA: Diagnosis not present

## 2015-10-10 DIAGNOSIS — R531 Weakness: Secondary | ICD-10-CM | POA: Diagnosis not present

## 2015-10-10 DIAGNOSIS — I5032 Chronic diastolic (congestive) heart failure: Secondary | ICD-10-CM | POA: Diagnosis not present

## 2015-10-11 DIAGNOSIS — M6281 Muscle weakness (generalized): Secondary | ICD-10-CM | POA: Diagnosis not present

## 2015-10-11 DIAGNOSIS — R279 Unspecified lack of coordination: Secondary | ICD-10-CM | POA: Diagnosis not present

## 2015-10-11 DIAGNOSIS — R531 Weakness: Secondary | ICD-10-CM | POA: Diagnosis not present

## 2015-10-11 DIAGNOSIS — I5032 Chronic diastolic (congestive) heart failure: Secondary | ICD-10-CM | POA: Diagnosis not present

## 2015-10-11 DIAGNOSIS — R2689 Other abnormalities of gait and mobility: Secondary | ICD-10-CM | POA: Diagnosis not present

## 2015-10-11 DIAGNOSIS — R278 Other lack of coordination: Secondary | ICD-10-CM | POA: Diagnosis not present

## 2015-10-12 DIAGNOSIS — R279 Unspecified lack of coordination: Secondary | ICD-10-CM | POA: Diagnosis not present

## 2015-10-12 DIAGNOSIS — R278 Other lack of coordination: Secondary | ICD-10-CM | POA: Diagnosis not present

## 2015-10-12 DIAGNOSIS — R2689 Other abnormalities of gait and mobility: Secondary | ICD-10-CM | POA: Diagnosis not present

## 2015-10-12 DIAGNOSIS — M6281 Muscle weakness (generalized): Secondary | ICD-10-CM | POA: Diagnosis not present

## 2015-10-12 DIAGNOSIS — R531 Weakness: Secondary | ICD-10-CM | POA: Diagnosis not present

## 2015-10-12 DIAGNOSIS — I5032 Chronic diastolic (congestive) heart failure: Secondary | ICD-10-CM | POA: Diagnosis not present

## 2015-10-13 DIAGNOSIS — R2689 Other abnormalities of gait and mobility: Secondary | ICD-10-CM | POA: Diagnosis not present

## 2015-10-13 DIAGNOSIS — R279 Unspecified lack of coordination: Secondary | ICD-10-CM | POA: Diagnosis not present

## 2015-10-13 DIAGNOSIS — M6281 Muscle weakness (generalized): Secondary | ICD-10-CM | POA: Diagnosis not present

## 2015-10-13 DIAGNOSIS — R278 Other lack of coordination: Secondary | ICD-10-CM | POA: Diagnosis not present

## 2015-10-13 DIAGNOSIS — I5032 Chronic diastolic (congestive) heart failure: Secondary | ICD-10-CM | POA: Diagnosis not present

## 2015-10-13 DIAGNOSIS — R531 Weakness: Secondary | ICD-10-CM | POA: Diagnosis not present

## 2015-10-14 DIAGNOSIS — R2689 Other abnormalities of gait and mobility: Secondary | ICD-10-CM | POA: Diagnosis not present

## 2015-10-14 DIAGNOSIS — R278 Other lack of coordination: Secondary | ICD-10-CM | POA: Diagnosis not present

## 2015-10-14 DIAGNOSIS — R279 Unspecified lack of coordination: Secondary | ICD-10-CM | POA: Diagnosis not present

## 2015-10-14 DIAGNOSIS — M6281 Muscle weakness (generalized): Secondary | ICD-10-CM | POA: Diagnosis not present

## 2015-10-14 DIAGNOSIS — R531 Weakness: Secondary | ICD-10-CM | POA: Diagnosis not present

## 2015-10-14 DIAGNOSIS — I5032 Chronic diastolic (congestive) heart failure: Secondary | ICD-10-CM | POA: Diagnosis not present

## 2015-10-16 DIAGNOSIS — R2689 Other abnormalities of gait and mobility: Secondary | ICD-10-CM | POA: Diagnosis not present

## 2015-10-16 DIAGNOSIS — R278 Other lack of coordination: Secondary | ICD-10-CM | POA: Diagnosis not present

## 2015-10-16 DIAGNOSIS — R531 Weakness: Secondary | ICD-10-CM | POA: Diagnosis not present

## 2015-10-16 DIAGNOSIS — M6281 Muscle weakness (generalized): Secondary | ICD-10-CM | POA: Diagnosis not present

## 2015-10-16 DIAGNOSIS — R279 Unspecified lack of coordination: Secondary | ICD-10-CM | POA: Diagnosis not present

## 2015-10-16 DIAGNOSIS — I5032 Chronic diastolic (congestive) heart failure: Secondary | ICD-10-CM | POA: Diagnosis not present

## 2015-10-17 DIAGNOSIS — I5032 Chronic diastolic (congestive) heart failure: Secondary | ICD-10-CM | POA: Diagnosis not present

## 2015-10-17 DIAGNOSIS — R278 Other lack of coordination: Secondary | ICD-10-CM | POA: Diagnosis not present

## 2015-10-17 DIAGNOSIS — R279 Unspecified lack of coordination: Secondary | ICD-10-CM | POA: Diagnosis not present

## 2015-10-17 DIAGNOSIS — R2689 Other abnormalities of gait and mobility: Secondary | ICD-10-CM | POA: Diagnosis not present

## 2015-10-17 DIAGNOSIS — M6281 Muscle weakness (generalized): Secondary | ICD-10-CM | POA: Diagnosis not present

## 2015-10-17 DIAGNOSIS — R531 Weakness: Secondary | ICD-10-CM | POA: Diagnosis not present

## 2015-10-18 DIAGNOSIS — R279 Unspecified lack of coordination: Secondary | ICD-10-CM | POA: Diagnosis not present

## 2015-10-18 DIAGNOSIS — R278 Other lack of coordination: Secondary | ICD-10-CM | POA: Diagnosis not present

## 2015-10-18 DIAGNOSIS — M6281 Muscle weakness (generalized): Secondary | ICD-10-CM | POA: Diagnosis not present

## 2015-10-18 DIAGNOSIS — R2689 Other abnormalities of gait and mobility: Secondary | ICD-10-CM | POA: Diagnosis not present

## 2015-10-18 DIAGNOSIS — R531 Weakness: Secondary | ICD-10-CM | POA: Diagnosis not present

## 2015-10-18 DIAGNOSIS — I5032 Chronic diastolic (congestive) heart failure: Secondary | ICD-10-CM | POA: Diagnosis not present

## 2015-10-19 DIAGNOSIS — M6281 Muscle weakness (generalized): Secondary | ICD-10-CM | POA: Diagnosis not present

## 2015-10-19 DIAGNOSIS — R278 Other lack of coordination: Secondary | ICD-10-CM | POA: Diagnosis not present

## 2015-10-19 DIAGNOSIS — F331 Major depressive disorder, recurrent, moderate: Secondary | ICD-10-CM | POA: Diagnosis not present

## 2015-10-19 DIAGNOSIS — R2689 Other abnormalities of gait and mobility: Secondary | ICD-10-CM | POA: Diagnosis not present

## 2015-10-19 DIAGNOSIS — R279 Unspecified lack of coordination: Secondary | ICD-10-CM | POA: Diagnosis not present

## 2015-10-19 DIAGNOSIS — I5032 Chronic diastolic (congestive) heart failure: Secondary | ICD-10-CM | POA: Diagnosis not present

## 2015-10-19 DIAGNOSIS — R531 Weakness: Secondary | ICD-10-CM | POA: Diagnosis not present

## 2015-10-20 DIAGNOSIS — R279 Unspecified lack of coordination: Secondary | ICD-10-CM | POA: Diagnosis not present

## 2015-10-20 DIAGNOSIS — R2689 Other abnormalities of gait and mobility: Secondary | ICD-10-CM | POA: Diagnosis not present

## 2015-10-20 DIAGNOSIS — M6281 Muscle weakness (generalized): Secondary | ICD-10-CM | POA: Diagnosis not present

## 2015-10-20 DIAGNOSIS — R278 Other lack of coordination: Secondary | ICD-10-CM | POA: Diagnosis not present

## 2015-10-20 DIAGNOSIS — I5032 Chronic diastolic (congestive) heart failure: Secondary | ICD-10-CM | POA: Diagnosis not present

## 2015-10-20 DIAGNOSIS — R531 Weakness: Secondary | ICD-10-CM | POA: Diagnosis not present

## 2015-10-23 DIAGNOSIS — M24542 Contracture, left hand: Secondary | ICD-10-CM | POA: Diagnosis not present

## 2015-10-23 DIAGNOSIS — R2689 Other abnormalities of gait and mobility: Secondary | ICD-10-CM | POA: Diagnosis not present

## 2015-10-23 DIAGNOSIS — I5032 Chronic diastolic (congestive) heart failure: Secondary | ICD-10-CM | POA: Diagnosis not present

## 2015-10-23 DIAGNOSIS — M6281 Muscle weakness (generalized): Secondary | ICD-10-CM | POA: Diagnosis not present

## 2015-10-23 DIAGNOSIS — R279 Unspecified lack of coordination: Secondary | ICD-10-CM | POA: Diagnosis not present

## 2015-10-23 DIAGNOSIS — F331 Major depressive disorder, recurrent, moderate: Secondary | ICD-10-CM | POA: Diagnosis not present

## 2015-10-23 DIAGNOSIS — R531 Weakness: Secondary | ICD-10-CM | POA: Diagnosis not present

## 2015-10-23 DIAGNOSIS — R278 Other lack of coordination: Secondary | ICD-10-CM | POA: Diagnosis not present

## 2015-10-24 DIAGNOSIS — I5032 Chronic diastolic (congestive) heart failure: Secondary | ICD-10-CM | POA: Diagnosis not present

## 2015-10-24 DIAGNOSIS — R278 Other lack of coordination: Secondary | ICD-10-CM | POA: Diagnosis not present

## 2015-10-24 DIAGNOSIS — R531 Weakness: Secondary | ICD-10-CM | POA: Diagnosis not present

## 2015-10-24 DIAGNOSIS — M6281 Muscle weakness (generalized): Secondary | ICD-10-CM | POA: Diagnosis not present

## 2015-10-24 DIAGNOSIS — R2689 Other abnormalities of gait and mobility: Secondary | ICD-10-CM | POA: Diagnosis not present

## 2015-10-24 DIAGNOSIS — R279 Unspecified lack of coordination: Secondary | ICD-10-CM | POA: Diagnosis not present

## 2015-10-25 DIAGNOSIS — I5032 Chronic diastolic (congestive) heart failure: Secondary | ICD-10-CM | POA: Diagnosis not present

## 2015-10-25 DIAGNOSIS — R2689 Other abnormalities of gait and mobility: Secondary | ICD-10-CM | POA: Diagnosis not present

## 2015-10-25 DIAGNOSIS — R278 Other lack of coordination: Secondary | ICD-10-CM | POA: Diagnosis not present

## 2015-10-25 DIAGNOSIS — R531 Weakness: Secondary | ICD-10-CM | POA: Diagnosis not present

## 2015-10-25 DIAGNOSIS — R279 Unspecified lack of coordination: Secondary | ICD-10-CM | POA: Diagnosis not present

## 2015-10-25 DIAGNOSIS — M6281 Muscle weakness (generalized): Secondary | ICD-10-CM | POA: Diagnosis not present

## 2015-10-26 DIAGNOSIS — R279 Unspecified lack of coordination: Secondary | ICD-10-CM | POA: Diagnosis not present

## 2015-10-26 DIAGNOSIS — F331 Major depressive disorder, recurrent, moderate: Secondary | ICD-10-CM | POA: Diagnosis not present

## 2015-10-26 DIAGNOSIS — I5032 Chronic diastolic (congestive) heart failure: Secondary | ICD-10-CM | POA: Diagnosis not present

## 2015-10-26 DIAGNOSIS — F419 Anxiety disorder, unspecified: Secondary | ICD-10-CM | POA: Diagnosis not present

## 2015-10-26 DIAGNOSIS — F39 Unspecified mood [affective] disorder: Secondary | ICD-10-CM | POA: Diagnosis not present

## 2015-10-26 DIAGNOSIS — R531 Weakness: Secondary | ICD-10-CM | POA: Diagnosis not present

## 2015-10-26 DIAGNOSIS — M6281 Muscle weakness (generalized): Secondary | ICD-10-CM | POA: Diagnosis not present

## 2015-10-26 DIAGNOSIS — R2689 Other abnormalities of gait and mobility: Secondary | ICD-10-CM | POA: Diagnosis not present

## 2015-10-26 DIAGNOSIS — R278 Other lack of coordination: Secondary | ICD-10-CM | POA: Diagnosis not present

## 2015-10-27 DIAGNOSIS — R279 Unspecified lack of coordination: Secondary | ICD-10-CM | POA: Diagnosis not present

## 2015-10-27 DIAGNOSIS — R2689 Other abnormalities of gait and mobility: Secondary | ICD-10-CM | POA: Diagnosis not present

## 2015-10-27 DIAGNOSIS — R278 Other lack of coordination: Secondary | ICD-10-CM | POA: Diagnosis not present

## 2015-10-27 DIAGNOSIS — I5032 Chronic diastolic (congestive) heart failure: Secondary | ICD-10-CM | POA: Diagnosis not present

## 2015-10-27 DIAGNOSIS — R531 Weakness: Secondary | ICD-10-CM | POA: Diagnosis not present

## 2015-10-27 DIAGNOSIS — M6281 Muscle weakness (generalized): Secondary | ICD-10-CM | POA: Diagnosis not present

## 2015-10-29 DIAGNOSIS — F331 Major depressive disorder, recurrent, moderate: Secondary | ICD-10-CM | POA: Diagnosis not present

## 2015-11-01 DIAGNOSIS — F331 Major depressive disorder, recurrent, moderate: Secondary | ICD-10-CM | POA: Diagnosis not present

## 2015-11-05 DIAGNOSIS — F331 Major depressive disorder, recurrent, moderate: Secondary | ICD-10-CM | POA: Diagnosis not present

## 2015-11-06 DIAGNOSIS — K219 Gastro-esophageal reflux disease without esophagitis: Secondary | ICD-10-CM | POA: Diagnosis not present

## 2015-11-06 DIAGNOSIS — I5022 Chronic systolic (congestive) heart failure: Secondary | ICD-10-CM | POA: Diagnosis not present

## 2015-11-06 DIAGNOSIS — J441 Chronic obstructive pulmonary disease with (acute) exacerbation: Secondary | ICD-10-CM | POA: Diagnosis not present

## 2015-11-07 DIAGNOSIS — F331 Major depressive disorder, recurrent, moderate: Secondary | ICD-10-CM | POA: Diagnosis not present

## 2015-11-12 DIAGNOSIS — F331 Major depressive disorder, recurrent, moderate: Secondary | ICD-10-CM | POA: Diagnosis not present

## 2015-11-14 DIAGNOSIS — F331 Major depressive disorder, recurrent, moderate: Secondary | ICD-10-CM | POA: Diagnosis not present

## 2015-11-26 DIAGNOSIS — F331 Major depressive disorder, recurrent, moderate: Secondary | ICD-10-CM | POA: Diagnosis not present

## 2015-11-28 DIAGNOSIS — I739 Peripheral vascular disease, unspecified: Secondary | ICD-10-CM | POA: Diagnosis not present

## 2015-11-28 DIAGNOSIS — F331 Major depressive disorder, recurrent, moderate: Secondary | ICD-10-CM | POA: Diagnosis not present

## 2015-12-03 DIAGNOSIS — F331 Major depressive disorder, recurrent, moderate: Secondary | ICD-10-CM | POA: Diagnosis not present

## 2015-12-06 DIAGNOSIS — N390 Urinary tract infection, site not specified: Secondary | ICD-10-CM | POA: Diagnosis not present

## 2015-12-21 DIAGNOSIS — K219 Gastro-esophageal reflux disease without esophagitis: Secondary | ICD-10-CM | POA: Diagnosis not present

## 2015-12-21 DIAGNOSIS — J441 Chronic obstructive pulmonary disease with (acute) exacerbation: Secondary | ICD-10-CM | POA: Diagnosis not present

## 2015-12-21 DIAGNOSIS — I5022 Chronic systolic (congestive) heart failure: Secondary | ICD-10-CM | POA: Diagnosis not present

## 2015-12-25 DIAGNOSIS — I5032 Chronic diastolic (congestive) heart failure: Secondary | ICD-10-CM | POA: Diagnosis not present

## 2015-12-25 DIAGNOSIS — M6281 Muscle weakness (generalized): Secondary | ICD-10-CM | POA: Diagnosis not present

## 2015-12-25 DIAGNOSIS — R2689 Other abnormalities of gait and mobility: Secondary | ICD-10-CM | POA: Diagnosis not present

## 2015-12-25 DIAGNOSIS — R278 Other lack of coordination: Secondary | ICD-10-CM | POA: Diagnosis not present

## 2015-12-25 DIAGNOSIS — M24542 Contracture, left hand: Secondary | ICD-10-CM | POA: Diagnosis not present

## 2015-12-25 DIAGNOSIS — R279 Unspecified lack of coordination: Secondary | ICD-10-CM | POA: Diagnosis not present

## 2015-12-25 DIAGNOSIS — R531 Weakness: Secondary | ICD-10-CM | POA: Diagnosis not present

## 2015-12-26 DIAGNOSIS — M6281 Muscle weakness (generalized): Secondary | ICD-10-CM | POA: Diagnosis not present

## 2015-12-26 DIAGNOSIS — R278 Other lack of coordination: Secondary | ICD-10-CM | POA: Diagnosis not present

## 2015-12-26 DIAGNOSIS — R531 Weakness: Secondary | ICD-10-CM | POA: Diagnosis not present

## 2015-12-26 DIAGNOSIS — R279 Unspecified lack of coordination: Secondary | ICD-10-CM | POA: Diagnosis not present

## 2015-12-26 DIAGNOSIS — I5032 Chronic diastolic (congestive) heart failure: Secondary | ICD-10-CM | POA: Diagnosis not present

## 2015-12-26 DIAGNOSIS — R2689 Other abnormalities of gait and mobility: Secondary | ICD-10-CM | POA: Diagnosis not present

## 2015-12-27 DIAGNOSIS — R2689 Other abnormalities of gait and mobility: Secondary | ICD-10-CM | POA: Diagnosis not present

## 2015-12-27 DIAGNOSIS — I5032 Chronic diastolic (congestive) heart failure: Secondary | ICD-10-CM | POA: Diagnosis not present

## 2015-12-27 DIAGNOSIS — R531 Weakness: Secondary | ICD-10-CM | POA: Diagnosis not present

## 2015-12-27 DIAGNOSIS — R279 Unspecified lack of coordination: Secondary | ICD-10-CM | POA: Diagnosis not present

## 2015-12-27 DIAGNOSIS — R278 Other lack of coordination: Secondary | ICD-10-CM | POA: Diagnosis not present

## 2015-12-27 DIAGNOSIS — M6281 Muscle weakness (generalized): Secondary | ICD-10-CM | POA: Diagnosis not present

## 2015-12-28 DIAGNOSIS — R278 Other lack of coordination: Secondary | ICD-10-CM | POA: Diagnosis not present

## 2015-12-28 DIAGNOSIS — I5032 Chronic diastolic (congestive) heart failure: Secondary | ICD-10-CM | POA: Diagnosis not present

## 2015-12-28 DIAGNOSIS — R2689 Other abnormalities of gait and mobility: Secondary | ICD-10-CM | POA: Diagnosis not present

## 2015-12-28 DIAGNOSIS — R531 Weakness: Secondary | ICD-10-CM | POA: Diagnosis not present

## 2015-12-28 DIAGNOSIS — M6281 Muscle weakness (generalized): Secondary | ICD-10-CM | POA: Diagnosis not present

## 2015-12-28 DIAGNOSIS — R279 Unspecified lack of coordination: Secondary | ICD-10-CM | POA: Diagnosis not present

## 2015-12-29 DIAGNOSIS — R278 Other lack of coordination: Secondary | ICD-10-CM | POA: Diagnosis not present

## 2015-12-29 DIAGNOSIS — R279 Unspecified lack of coordination: Secondary | ICD-10-CM | POA: Diagnosis not present

## 2015-12-29 DIAGNOSIS — I5032 Chronic diastolic (congestive) heart failure: Secondary | ICD-10-CM | POA: Diagnosis not present

## 2015-12-29 DIAGNOSIS — M6281 Muscle weakness (generalized): Secondary | ICD-10-CM | POA: Diagnosis not present

## 2015-12-29 DIAGNOSIS — R531 Weakness: Secondary | ICD-10-CM | POA: Diagnosis not present

## 2015-12-29 DIAGNOSIS — R2689 Other abnormalities of gait and mobility: Secondary | ICD-10-CM | POA: Diagnosis not present

## 2016-01-01 DIAGNOSIS — R2689 Other abnormalities of gait and mobility: Secondary | ICD-10-CM | POA: Diagnosis not present

## 2016-01-01 DIAGNOSIS — I5032 Chronic diastolic (congestive) heart failure: Secondary | ICD-10-CM | POA: Diagnosis not present

## 2016-01-01 DIAGNOSIS — R279 Unspecified lack of coordination: Secondary | ICD-10-CM | POA: Diagnosis not present

## 2016-01-01 DIAGNOSIS — M6281 Muscle weakness (generalized): Secondary | ICD-10-CM | POA: Diagnosis not present

## 2016-01-01 DIAGNOSIS — R278 Other lack of coordination: Secondary | ICD-10-CM | POA: Diagnosis not present

## 2016-01-01 DIAGNOSIS — R531 Weakness: Secondary | ICD-10-CM | POA: Diagnosis not present

## 2016-01-02 DIAGNOSIS — R2689 Other abnormalities of gait and mobility: Secondary | ICD-10-CM | POA: Diagnosis not present

## 2016-01-02 DIAGNOSIS — M6281 Muscle weakness (generalized): Secondary | ICD-10-CM | POA: Diagnosis not present

## 2016-01-02 DIAGNOSIS — R279 Unspecified lack of coordination: Secondary | ICD-10-CM | POA: Diagnosis not present

## 2016-01-02 DIAGNOSIS — R531 Weakness: Secondary | ICD-10-CM | POA: Diagnosis not present

## 2016-01-02 DIAGNOSIS — R278 Other lack of coordination: Secondary | ICD-10-CM | POA: Diagnosis not present

## 2016-01-02 DIAGNOSIS — I5032 Chronic diastolic (congestive) heart failure: Secondary | ICD-10-CM | POA: Diagnosis not present

## 2016-01-03 DIAGNOSIS — I5032 Chronic diastolic (congestive) heart failure: Secondary | ICD-10-CM | POA: Diagnosis not present

## 2016-01-03 DIAGNOSIS — M6281 Muscle weakness (generalized): Secondary | ICD-10-CM | POA: Diagnosis not present

## 2016-01-03 DIAGNOSIS — R531 Weakness: Secondary | ICD-10-CM | POA: Diagnosis not present

## 2016-01-03 DIAGNOSIS — R278 Other lack of coordination: Secondary | ICD-10-CM | POA: Diagnosis not present

## 2016-01-03 DIAGNOSIS — R2689 Other abnormalities of gait and mobility: Secondary | ICD-10-CM | POA: Diagnosis not present

## 2016-01-03 DIAGNOSIS — R279 Unspecified lack of coordination: Secondary | ICD-10-CM | POA: Diagnosis not present

## 2016-01-04 DIAGNOSIS — I5032 Chronic diastolic (congestive) heart failure: Secondary | ICD-10-CM | POA: Diagnosis not present

## 2016-01-04 DIAGNOSIS — R531 Weakness: Secondary | ICD-10-CM | POA: Diagnosis not present

## 2016-01-04 DIAGNOSIS — R279 Unspecified lack of coordination: Secondary | ICD-10-CM | POA: Diagnosis not present

## 2016-01-04 DIAGNOSIS — M6281 Muscle weakness (generalized): Secondary | ICD-10-CM | POA: Diagnosis not present

## 2016-01-04 DIAGNOSIS — R2689 Other abnormalities of gait and mobility: Secondary | ICD-10-CM | POA: Diagnosis not present

## 2016-01-04 DIAGNOSIS — R278 Other lack of coordination: Secondary | ICD-10-CM | POA: Diagnosis not present

## 2016-01-05 DIAGNOSIS — R531 Weakness: Secondary | ICD-10-CM | POA: Diagnosis not present

## 2016-01-05 DIAGNOSIS — R2689 Other abnormalities of gait and mobility: Secondary | ICD-10-CM | POA: Diagnosis not present

## 2016-01-05 DIAGNOSIS — M6281 Muscle weakness (generalized): Secondary | ICD-10-CM | POA: Diagnosis not present

## 2016-01-05 DIAGNOSIS — R279 Unspecified lack of coordination: Secondary | ICD-10-CM | POA: Diagnosis not present

## 2016-01-05 DIAGNOSIS — R278 Other lack of coordination: Secondary | ICD-10-CM | POA: Diagnosis not present

## 2016-01-05 DIAGNOSIS — I5032 Chronic diastolic (congestive) heart failure: Secondary | ICD-10-CM | POA: Diagnosis not present

## 2016-01-07 DIAGNOSIS — F331 Major depressive disorder, recurrent, moderate: Secondary | ICD-10-CM | POA: Diagnosis not present

## 2016-01-08 DIAGNOSIS — R2689 Other abnormalities of gait and mobility: Secondary | ICD-10-CM | POA: Diagnosis not present

## 2016-01-08 DIAGNOSIS — R279 Unspecified lack of coordination: Secondary | ICD-10-CM | POA: Diagnosis not present

## 2016-01-08 DIAGNOSIS — R278 Other lack of coordination: Secondary | ICD-10-CM | POA: Diagnosis not present

## 2016-01-08 DIAGNOSIS — I5032 Chronic diastolic (congestive) heart failure: Secondary | ICD-10-CM | POA: Diagnosis not present

## 2016-01-08 DIAGNOSIS — M6281 Muscle weakness (generalized): Secondary | ICD-10-CM | POA: Diagnosis not present

## 2016-01-08 DIAGNOSIS — R531 Weakness: Secondary | ICD-10-CM | POA: Diagnosis not present

## 2016-01-09 DIAGNOSIS — R279 Unspecified lack of coordination: Secondary | ICD-10-CM | POA: Diagnosis not present

## 2016-01-09 DIAGNOSIS — R278 Other lack of coordination: Secondary | ICD-10-CM | POA: Diagnosis not present

## 2016-01-09 DIAGNOSIS — M6281 Muscle weakness (generalized): Secondary | ICD-10-CM | POA: Diagnosis not present

## 2016-01-09 DIAGNOSIS — R531 Weakness: Secondary | ICD-10-CM | POA: Diagnosis not present

## 2016-01-09 DIAGNOSIS — R2689 Other abnormalities of gait and mobility: Secondary | ICD-10-CM | POA: Diagnosis not present

## 2016-01-09 DIAGNOSIS — I5032 Chronic diastolic (congestive) heart failure: Secondary | ICD-10-CM | POA: Diagnosis not present

## 2016-01-10 DIAGNOSIS — R278 Other lack of coordination: Secondary | ICD-10-CM | POA: Diagnosis not present

## 2016-01-10 DIAGNOSIS — M6281 Muscle weakness (generalized): Secondary | ICD-10-CM | POA: Diagnosis not present

## 2016-01-10 DIAGNOSIS — R531 Weakness: Secondary | ICD-10-CM | POA: Diagnosis not present

## 2016-01-10 DIAGNOSIS — R279 Unspecified lack of coordination: Secondary | ICD-10-CM | POA: Diagnosis not present

## 2016-01-10 DIAGNOSIS — R2689 Other abnormalities of gait and mobility: Secondary | ICD-10-CM | POA: Diagnosis not present

## 2016-01-10 DIAGNOSIS — I5032 Chronic diastolic (congestive) heart failure: Secondary | ICD-10-CM | POA: Diagnosis not present

## 2016-01-11 DIAGNOSIS — Z8673 Personal history of transient ischemic attack (TIA), and cerebral infarction without residual deficits: Secondary | ICD-10-CM | POA: Diagnosis not present

## 2016-01-11 DIAGNOSIS — R279 Unspecified lack of coordination: Secondary | ICD-10-CM | POA: Diagnosis not present

## 2016-01-11 DIAGNOSIS — M6281 Muscle weakness (generalized): Secondary | ICD-10-CM | POA: Diagnosis not present

## 2016-01-11 DIAGNOSIS — R2689 Other abnormalities of gait and mobility: Secondary | ICD-10-CM | POA: Diagnosis not present

## 2016-01-11 DIAGNOSIS — I5032 Chronic diastolic (congestive) heart failure: Secondary | ICD-10-CM | POA: Diagnosis not present

## 2016-01-11 DIAGNOSIS — R278 Other lack of coordination: Secondary | ICD-10-CM | POA: Diagnosis not present

## 2016-01-11 DIAGNOSIS — M24542 Contracture, left hand: Secondary | ICD-10-CM | POA: Diagnosis not present

## 2016-01-11 DIAGNOSIS — R531 Weakness: Secondary | ICD-10-CM | POA: Diagnosis not present

## 2016-01-12 DIAGNOSIS — M6281 Muscle weakness (generalized): Secondary | ICD-10-CM | POA: Diagnosis not present

## 2016-01-12 DIAGNOSIS — R7301 Impaired fasting glucose: Secondary | ICD-10-CM | POA: Diagnosis not present

## 2016-01-12 DIAGNOSIS — R278 Other lack of coordination: Secondary | ICD-10-CM | POA: Diagnosis not present

## 2016-01-12 DIAGNOSIS — R531 Weakness: Secondary | ICD-10-CM | POA: Diagnosis not present

## 2016-01-12 DIAGNOSIS — R2689 Other abnormalities of gait and mobility: Secondary | ICD-10-CM | POA: Diagnosis not present

## 2016-01-12 DIAGNOSIS — I5032 Chronic diastolic (congestive) heart failure: Secondary | ICD-10-CM | POA: Diagnosis not present

## 2016-01-12 DIAGNOSIS — R279 Unspecified lack of coordination: Secondary | ICD-10-CM | POA: Diagnosis not present

## 2016-01-12 DIAGNOSIS — I10 Essential (primary) hypertension: Secondary | ICD-10-CM | POA: Diagnosis not present

## 2016-01-14 DIAGNOSIS — F331 Major depressive disorder, recurrent, moderate: Secondary | ICD-10-CM | POA: Diagnosis not present

## 2016-01-15 DIAGNOSIS — R279 Unspecified lack of coordination: Secondary | ICD-10-CM | POA: Diagnosis not present

## 2016-01-15 DIAGNOSIS — R531 Weakness: Secondary | ICD-10-CM | POA: Diagnosis not present

## 2016-01-15 DIAGNOSIS — R278 Other lack of coordination: Secondary | ICD-10-CM | POA: Diagnosis not present

## 2016-01-15 DIAGNOSIS — I5032 Chronic diastolic (congestive) heart failure: Secondary | ICD-10-CM | POA: Diagnosis not present

## 2016-01-15 DIAGNOSIS — R2689 Other abnormalities of gait and mobility: Secondary | ICD-10-CM | POA: Diagnosis not present

## 2016-01-15 DIAGNOSIS — M6281 Muscle weakness (generalized): Secondary | ICD-10-CM | POA: Diagnosis not present

## 2016-01-16 DIAGNOSIS — R278 Other lack of coordination: Secondary | ICD-10-CM | POA: Diagnosis not present

## 2016-01-16 DIAGNOSIS — M6281 Muscle weakness (generalized): Secondary | ICD-10-CM | POA: Diagnosis not present

## 2016-01-16 DIAGNOSIS — R531 Weakness: Secondary | ICD-10-CM | POA: Diagnosis not present

## 2016-01-16 DIAGNOSIS — I5032 Chronic diastolic (congestive) heart failure: Secondary | ICD-10-CM | POA: Diagnosis not present

## 2016-01-16 DIAGNOSIS — R2689 Other abnormalities of gait and mobility: Secondary | ICD-10-CM | POA: Diagnosis not present

## 2016-01-16 DIAGNOSIS — R279 Unspecified lack of coordination: Secondary | ICD-10-CM | POA: Diagnosis not present

## 2016-01-17 DIAGNOSIS — R278 Other lack of coordination: Secondary | ICD-10-CM | POA: Diagnosis not present

## 2016-01-17 DIAGNOSIS — R531 Weakness: Secondary | ICD-10-CM | POA: Diagnosis not present

## 2016-01-17 DIAGNOSIS — R279 Unspecified lack of coordination: Secondary | ICD-10-CM | POA: Diagnosis not present

## 2016-01-17 DIAGNOSIS — M6281 Muscle weakness (generalized): Secondary | ICD-10-CM | POA: Diagnosis not present

## 2016-01-17 DIAGNOSIS — I5032 Chronic diastolic (congestive) heart failure: Secondary | ICD-10-CM | POA: Diagnosis not present

## 2016-01-17 DIAGNOSIS — R2689 Other abnormalities of gait and mobility: Secondary | ICD-10-CM | POA: Diagnosis not present

## 2016-01-18 DIAGNOSIS — M6281 Muscle weakness (generalized): Secondary | ICD-10-CM | POA: Diagnosis not present

## 2016-01-18 DIAGNOSIS — R279 Unspecified lack of coordination: Secondary | ICD-10-CM | POA: Diagnosis not present

## 2016-01-18 DIAGNOSIS — R2689 Other abnormalities of gait and mobility: Secondary | ICD-10-CM | POA: Diagnosis not present

## 2016-01-18 DIAGNOSIS — R278 Other lack of coordination: Secondary | ICD-10-CM | POA: Diagnosis not present

## 2016-01-18 DIAGNOSIS — R531 Weakness: Secondary | ICD-10-CM | POA: Diagnosis not present

## 2016-01-18 DIAGNOSIS — I5032 Chronic diastolic (congestive) heart failure: Secondary | ICD-10-CM | POA: Diagnosis not present

## 2016-01-19 DIAGNOSIS — R279 Unspecified lack of coordination: Secondary | ICD-10-CM | POA: Diagnosis not present

## 2016-01-19 DIAGNOSIS — R2689 Other abnormalities of gait and mobility: Secondary | ICD-10-CM | POA: Diagnosis not present

## 2016-01-19 DIAGNOSIS — I5032 Chronic diastolic (congestive) heart failure: Secondary | ICD-10-CM | POA: Diagnosis not present

## 2016-01-19 DIAGNOSIS — R278 Other lack of coordination: Secondary | ICD-10-CM | POA: Diagnosis not present

## 2016-01-19 DIAGNOSIS — M6281 Muscle weakness (generalized): Secondary | ICD-10-CM | POA: Diagnosis not present

## 2016-01-19 DIAGNOSIS — R531 Weakness: Secondary | ICD-10-CM | POA: Diagnosis not present

## 2016-01-20 DIAGNOSIS — M6281 Muscle weakness (generalized): Secondary | ICD-10-CM | POA: Diagnosis not present

## 2016-01-20 DIAGNOSIS — R531 Weakness: Secondary | ICD-10-CM | POA: Diagnosis not present

## 2016-01-20 DIAGNOSIS — R279 Unspecified lack of coordination: Secondary | ICD-10-CM | POA: Diagnosis not present

## 2016-01-20 DIAGNOSIS — R278 Other lack of coordination: Secondary | ICD-10-CM | POA: Diagnosis not present

## 2016-01-20 DIAGNOSIS — F331 Major depressive disorder, recurrent, moderate: Secondary | ICD-10-CM | POA: Diagnosis not present

## 2016-01-20 DIAGNOSIS — R2689 Other abnormalities of gait and mobility: Secondary | ICD-10-CM | POA: Diagnosis not present

## 2016-01-20 DIAGNOSIS — I5032 Chronic diastolic (congestive) heart failure: Secondary | ICD-10-CM | POA: Diagnosis not present

## 2016-01-22 DIAGNOSIS — R278 Other lack of coordination: Secondary | ICD-10-CM | POA: Diagnosis not present

## 2016-01-22 DIAGNOSIS — M6281 Muscle weakness (generalized): Secondary | ICD-10-CM | POA: Diagnosis not present

## 2016-01-22 DIAGNOSIS — I5032 Chronic diastolic (congestive) heart failure: Secondary | ICD-10-CM | POA: Diagnosis not present

## 2016-01-22 DIAGNOSIS — R279 Unspecified lack of coordination: Secondary | ICD-10-CM | POA: Diagnosis not present

## 2016-01-22 DIAGNOSIS — R2689 Other abnormalities of gait and mobility: Secondary | ICD-10-CM | POA: Diagnosis not present

## 2016-01-22 DIAGNOSIS — R531 Weakness: Secondary | ICD-10-CM | POA: Diagnosis not present

## 2016-01-23 DIAGNOSIS — R531 Weakness: Secondary | ICD-10-CM | POA: Diagnosis not present

## 2016-01-23 DIAGNOSIS — R279 Unspecified lack of coordination: Secondary | ICD-10-CM | POA: Diagnosis not present

## 2016-01-23 DIAGNOSIS — I5032 Chronic diastolic (congestive) heart failure: Secondary | ICD-10-CM | POA: Diagnosis not present

## 2016-01-23 DIAGNOSIS — R2689 Other abnormalities of gait and mobility: Secondary | ICD-10-CM | POA: Diagnosis not present

## 2016-01-23 DIAGNOSIS — M6281 Muscle weakness (generalized): Secondary | ICD-10-CM | POA: Diagnosis not present

## 2016-01-23 DIAGNOSIS — R278 Other lack of coordination: Secondary | ICD-10-CM | POA: Diagnosis not present

## 2016-01-24 DIAGNOSIS — I5032 Chronic diastolic (congestive) heart failure: Secondary | ICD-10-CM | POA: Diagnosis not present

## 2016-01-24 DIAGNOSIS — R278 Other lack of coordination: Secondary | ICD-10-CM | POA: Diagnosis not present

## 2016-01-24 DIAGNOSIS — M6281 Muscle weakness (generalized): Secondary | ICD-10-CM | POA: Diagnosis not present

## 2016-01-24 DIAGNOSIS — R531 Weakness: Secondary | ICD-10-CM | POA: Diagnosis not present

## 2016-01-24 DIAGNOSIS — R2689 Other abnormalities of gait and mobility: Secondary | ICD-10-CM | POA: Diagnosis not present

## 2016-01-24 DIAGNOSIS — R279 Unspecified lack of coordination: Secondary | ICD-10-CM | POA: Diagnosis not present

## 2016-01-25 DIAGNOSIS — R279 Unspecified lack of coordination: Secondary | ICD-10-CM | POA: Diagnosis not present

## 2016-01-25 DIAGNOSIS — R531 Weakness: Secondary | ICD-10-CM | POA: Diagnosis not present

## 2016-01-25 DIAGNOSIS — F39 Unspecified mood [affective] disorder: Secondary | ICD-10-CM | POA: Diagnosis not present

## 2016-01-25 DIAGNOSIS — M6281 Muscle weakness (generalized): Secondary | ICD-10-CM | POA: Diagnosis not present

## 2016-01-25 DIAGNOSIS — F419 Anxiety disorder, unspecified: Secondary | ICD-10-CM | POA: Diagnosis not present

## 2016-01-25 DIAGNOSIS — R278 Other lack of coordination: Secondary | ICD-10-CM | POA: Diagnosis not present

## 2016-01-25 DIAGNOSIS — R2689 Other abnormalities of gait and mobility: Secondary | ICD-10-CM | POA: Diagnosis not present

## 2016-01-25 DIAGNOSIS — I5032 Chronic diastolic (congestive) heart failure: Secondary | ICD-10-CM | POA: Diagnosis not present

## 2016-01-26 DIAGNOSIS — R2689 Other abnormalities of gait and mobility: Secondary | ICD-10-CM | POA: Diagnosis not present

## 2016-01-26 DIAGNOSIS — R278 Other lack of coordination: Secondary | ICD-10-CM | POA: Diagnosis not present

## 2016-01-26 DIAGNOSIS — M6281 Muscle weakness (generalized): Secondary | ICD-10-CM | POA: Diagnosis not present

## 2016-01-26 DIAGNOSIS — R531 Weakness: Secondary | ICD-10-CM | POA: Diagnosis not present

## 2016-01-26 DIAGNOSIS — R279 Unspecified lack of coordination: Secondary | ICD-10-CM | POA: Diagnosis not present

## 2016-01-26 DIAGNOSIS — I5032 Chronic diastolic (congestive) heart failure: Secondary | ICD-10-CM | POA: Diagnosis not present

## 2016-01-27 DIAGNOSIS — F331 Major depressive disorder, recurrent, moderate: Secondary | ICD-10-CM | POA: Diagnosis not present

## 2016-01-27 DIAGNOSIS — Z23 Encounter for immunization: Secondary | ICD-10-CM | POA: Diagnosis not present

## 2016-01-29 DIAGNOSIS — R2689 Other abnormalities of gait and mobility: Secondary | ICD-10-CM | POA: Diagnosis not present

## 2016-01-29 DIAGNOSIS — R279 Unspecified lack of coordination: Secondary | ICD-10-CM | POA: Diagnosis not present

## 2016-01-29 DIAGNOSIS — I5032 Chronic diastolic (congestive) heart failure: Secondary | ICD-10-CM | POA: Diagnosis not present

## 2016-01-29 DIAGNOSIS — R531 Weakness: Secondary | ICD-10-CM | POA: Diagnosis not present

## 2016-01-29 DIAGNOSIS — R278 Other lack of coordination: Secondary | ICD-10-CM | POA: Diagnosis not present

## 2016-01-29 DIAGNOSIS — M6281 Muscle weakness (generalized): Secondary | ICD-10-CM | POA: Diagnosis not present

## 2016-01-30 DIAGNOSIS — I5032 Chronic diastolic (congestive) heart failure: Secondary | ICD-10-CM | POA: Diagnosis not present

## 2016-01-30 DIAGNOSIS — F331 Major depressive disorder, recurrent, moderate: Secondary | ICD-10-CM | POA: Diagnosis not present

## 2016-01-30 DIAGNOSIS — R278 Other lack of coordination: Secondary | ICD-10-CM | POA: Diagnosis not present

## 2016-01-30 DIAGNOSIS — R279 Unspecified lack of coordination: Secondary | ICD-10-CM | POA: Diagnosis not present

## 2016-01-30 DIAGNOSIS — R531 Weakness: Secondary | ICD-10-CM | POA: Diagnosis not present

## 2016-01-30 DIAGNOSIS — R2689 Other abnormalities of gait and mobility: Secondary | ICD-10-CM | POA: Diagnosis not present

## 2016-01-30 DIAGNOSIS — M6281 Muscle weakness (generalized): Secondary | ICD-10-CM | POA: Diagnosis not present

## 2016-01-31 DIAGNOSIS — R2689 Other abnormalities of gait and mobility: Secondary | ICD-10-CM | POA: Diagnosis not present

## 2016-01-31 DIAGNOSIS — I5032 Chronic diastolic (congestive) heart failure: Secondary | ICD-10-CM | POA: Diagnosis not present

## 2016-01-31 DIAGNOSIS — R279 Unspecified lack of coordination: Secondary | ICD-10-CM | POA: Diagnosis not present

## 2016-01-31 DIAGNOSIS — R278 Other lack of coordination: Secondary | ICD-10-CM | POA: Diagnosis not present

## 2016-01-31 DIAGNOSIS — M6281 Muscle weakness (generalized): Secondary | ICD-10-CM | POA: Diagnosis not present

## 2016-01-31 DIAGNOSIS — R531 Weakness: Secondary | ICD-10-CM | POA: Diagnosis not present

## 2016-02-01 DIAGNOSIS — R279 Unspecified lack of coordination: Secondary | ICD-10-CM | POA: Diagnosis not present

## 2016-02-01 DIAGNOSIS — K219 Gastro-esophageal reflux disease without esophagitis: Secondary | ICD-10-CM | POA: Diagnosis not present

## 2016-02-01 DIAGNOSIS — R278 Other lack of coordination: Secondary | ICD-10-CM | POA: Diagnosis not present

## 2016-02-01 DIAGNOSIS — J441 Chronic obstructive pulmonary disease with (acute) exacerbation: Secondary | ICD-10-CM | POA: Diagnosis not present

## 2016-02-01 DIAGNOSIS — I5032 Chronic diastolic (congestive) heart failure: Secondary | ICD-10-CM | POA: Diagnosis not present

## 2016-02-01 DIAGNOSIS — I5022 Chronic systolic (congestive) heart failure: Secondary | ICD-10-CM | POA: Diagnosis not present

## 2016-02-01 DIAGNOSIS — R531 Weakness: Secondary | ICD-10-CM | POA: Diagnosis not present

## 2016-02-01 DIAGNOSIS — M6281 Muscle weakness (generalized): Secondary | ICD-10-CM | POA: Diagnosis not present

## 2016-02-01 DIAGNOSIS — R2689 Other abnormalities of gait and mobility: Secondary | ICD-10-CM | POA: Diagnosis not present

## 2016-02-02 DIAGNOSIS — R279 Unspecified lack of coordination: Secondary | ICD-10-CM | POA: Diagnosis not present

## 2016-02-02 DIAGNOSIS — R531 Weakness: Secondary | ICD-10-CM | POA: Diagnosis not present

## 2016-02-02 DIAGNOSIS — R278 Other lack of coordination: Secondary | ICD-10-CM | POA: Diagnosis not present

## 2016-02-02 DIAGNOSIS — M6281 Muscle weakness (generalized): Secondary | ICD-10-CM | POA: Diagnosis not present

## 2016-02-02 DIAGNOSIS — R2689 Other abnormalities of gait and mobility: Secondary | ICD-10-CM | POA: Diagnosis not present

## 2016-02-02 DIAGNOSIS — I5032 Chronic diastolic (congestive) heart failure: Secondary | ICD-10-CM | POA: Diagnosis not present

## 2016-02-03 DIAGNOSIS — M6281 Muscle weakness (generalized): Secondary | ICD-10-CM | POA: Diagnosis not present

## 2016-02-03 DIAGNOSIS — R2689 Other abnormalities of gait and mobility: Secondary | ICD-10-CM | POA: Diagnosis not present

## 2016-02-03 DIAGNOSIS — I5032 Chronic diastolic (congestive) heart failure: Secondary | ICD-10-CM | POA: Diagnosis not present

## 2016-02-03 DIAGNOSIS — F331 Major depressive disorder, recurrent, moderate: Secondary | ICD-10-CM | POA: Diagnosis not present

## 2016-02-03 DIAGNOSIS — R531 Weakness: Secondary | ICD-10-CM | POA: Diagnosis not present

## 2016-02-03 DIAGNOSIS — R278 Other lack of coordination: Secondary | ICD-10-CM | POA: Diagnosis not present

## 2016-02-03 DIAGNOSIS — R279 Unspecified lack of coordination: Secondary | ICD-10-CM | POA: Diagnosis not present

## 2016-02-05 DIAGNOSIS — R2689 Other abnormalities of gait and mobility: Secondary | ICD-10-CM | POA: Diagnosis not present

## 2016-02-05 DIAGNOSIS — I5032 Chronic diastolic (congestive) heart failure: Secondary | ICD-10-CM | POA: Diagnosis not present

## 2016-02-05 DIAGNOSIS — M6281 Muscle weakness (generalized): Secondary | ICD-10-CM | POA: Diagnosis not present

## 2016-02-05 DIAGNOSIS — R278 Other lack of coordination: Secondary | ICD-10-CM | POA: Diagnosis not present

## 2016-02-05 DIAGNOSIS — R531 Weakness: Secondary | ICD-10-CM | POA: Diagnosis not present

## 2016-02-05 DIAGNOSIS — R279 Unspecified lack of coordination: Secondary | ICD-10-CM | POA: Diagnosis not present

## 2016-02-06 DIAGNOSIS — R279 Unspecified lack of coordination: Secondary | ICD-10-CM | POA: Diagnosis not present

## 2016-02-06 DIAGNOSIS — I5032 Chronic diastolic (congestive) heart failure: Secondary | ICD-10-CM | POA: Diagnosis not present

## 2016-02-06 DIAGNOSIS — R278 Other lack of coordination: Secondary | ICD-10-CM | POA: Diagnosis not present

## 2016-02-06 DIAGNOSIS — R531 Weakness: Secondary | ICD-10-CM | POA: Diagnosis not present

## 2016-02-06 DIAGNOSIS — M6281 Muscle weakness (generalized): Secondary | ICD-10-CM | POA: Diagnosis not present

## 2016-02-06 DIAGNOSIS — R2689 Other abnormalities of gait and mobility: Secondary | ICD-10-CM | POA: Diagnosis not present

## 2016-02-07 DIAGNOSIS — I5032 Chronic diastolic (congestive) heart failure: Secondary | ICD-10-CM | POA: Diagnosis not present

## 2016-02-07 DIAGNOSIS — R279 Unspecified lack of coordination: Secondary | ICD-10-CM | POA: Diagnosis not present

## 2016-02-07 DIAGNOSIS — R531 Weakness: Secondary | ICD-10-CM | POA: Diagnosis not present

## 2016-02-07 DIAGNOSIS — R2689 Other abnormalities of gait and mobility: Secondary | ICD-10-CM | POA: Diagnosis not present

## 2016-02-07 DIAGNOSIS — R278 Other lack of coordination: Secondary | ICD-10-CM | POA: Diagnosis not present

## 2016-02-07 DIAGNOSIS — M6281 Muscle weakness (generalized): Secondary | ICD-10-CM | POA: Diagnosis not present

## 2016-02-08 DIAGNOSIS — M6281 Muscle weakness (generalized): Secondary | ICD-10-CM | POA: Diagnosis not present

## 2016-02-08 DIAGNOSIS — R279 Unspecified lack of coordination: Secondary | ICD-10-CM | POA: Diagnosis not present

## 2016-02-08 DIAGNOSIS — R2689 Other abnormalities of gait and mobility: Secondary | ICD-10-CM | POA: Diagnosis not present

## 2016-02-08 DIAGNOSIS — R531 Weakness: Secondary | ICD-10-CM | POA: Diagnosis not present

## 2016-02-08 DIAGNOSIS — I5032 Chronic diastolic (congestive) heart failure: Secondary | ICD-10-CM | POA: Diagnosis not present

## 2016-02-08 DIAGNOSIS — R278 Other lack of coordination: Secondary | ICD-10-CM | POA: Diagnosis not present

## 2016-02-09 DIAGNOSIS — I739 Peripheral vascular disease, unspecified: Secondary | ICD-10-CM | POA: Diagnosis not present

## 2016-02-09 DIAGNOSIS — I5032 Chronic diastolic (congestive) heart failure: Secondary | ICD-10-CM | POA: Diagnosis not present

## 2016-02-09 DIAGNOSIS — R278 Other lack of coordination: Secondary | ICD-10-CM | POA: Diagnosis not present

## 2016-02-09 DIAGNOSIS — R2689 Other abnormalities of gait and mobility: Secondary | ICD-10-CM | POA: Diagnosis not present

## 2016-02-09 DIAGNOSIS — R531 Weakness: Secondary | ICD-10-CM | POA: Diagnosis not present

## 2016-02-09 DIAGNOSIS — R279 Unspecified lack of coordination: Secondary | ICD-10-CM | POA: Diagnosis not present

## 2016-02-09 DIAGNOSIS — M6281 Muscle weakness (generalized): Secondary | ICD-10-CM | POA: Diagnosis not present

## 2016-02-10 DIAGNOSIS — F331 Major depressive disorder, recurrent, moderate: Secondary | ICD-10-CM | POA: Diagnosis not present

## 2016-02-12 ENCOUNTER — Ambulatory Visit: Payer: Self-pay | Admitting: Neurology

## 2016-02-12 DIAGNOSIS — R531 Weakness: Secondary | ICD-10-CM | POA: Diagnosis not present

## 2016-02-12 DIAGNOSIS — R279 Unspecified lack of coordination: Secondary | ICD-10-CM | POA: Diagnosis not present

## 2016-02-12 DIAGNOSIS — M6281 Muscle weakness (generalized): Secondary | ICD-10-CM | POA: Diagnosis not present

## 2016-02-12 DIAGNOSIS — I5032 Chronic diastolic (congestive) heart failure: Secondary | ICD-10-CM | POA: Diagnosis not present

## 2016-02-12 DIAGNOSIS — R278 Other lack of coordination: Secondary | ICD-10-CM | POA: Diagnosis not present

## 2016-02-12 DIAGNOSIS — R2689 Other abnormalities of gait and mobility: Secondary | ICD-10-CM | POA: Diagnosis not present

## 2016-02-13 DIAGNOSIS — R278 Other lack of coordination: Secondary | ICD-10-CM | POA: Diagnosis not present

## 2016-02-13 DIAGNOSIS — M6281 Muscle weakness (generalized): Secondary | ICD-10-CM | POA: Diagnosis not present

## 2016-02-13 DIAGNOSIS — R531 Weakness: Secondary | ICD-10-CM | POA: Diagnosis not present

## 2016-02-13 DIAGNOSIS — R279 Unspecified lack of coordination: Secondary | ICD-10-CM | POA: Diagnosis not present

## 2016-02-13 DIAGNOSIS — R2689 Other abnormalities of gait and mobility: Secondary | ICD-10-CM | POA: Diagnosis not present

## 2016-02-13 DIAGNOSIS — I5032 Chronic diastolic (congestive) heart failure: Secondary | ICD-10-CM | POA: Diagnosis not present

## 2016-02-14 DIAGNOSIS — R278 Other lack of coordination: Secondary | ICD-10-CM | POA: Diagnosis not present

## 2016-02-14 DIAGNOSIS — R2689 Other abnormalities of gait and mobility: Secondary | ICD-10-CM | POA: Diagnosis not present

## 2016-02-14 DIAGNOSIS — F331 Major depressive disorder, recurrent, moderate: Secondary | ICD-10-CM | POA: Diagnosis not present

## 2016-02-14 DIAGNOSIS — R531 Weakness: Secondary | ICD-10-CM | POA: Diagnosis not present

## 2016-02-14 DIAGNOSIS — R279 Unspecified lack of coordination: Secondary | ICD-10-CM | POA: Diagnosis not present

## 2016-02-14 DIAGNOSIS — I5032 Chronic diastolic (congestive) heart failure: Secondary | ICD-10-CM | POA: Diagnosis not present

## 2016-02-14 DIAGNOSIS — M6281 Muscle weakness (generalized): Secondary | ICD-10-CM | POA: Diagnosis not present

## 2016-02-15 DIAGNOSIS — R279 Unspecified lack of coordination: Secondary | ICD-10-CM | POA: Diagnosis not present

## 2016-02-15 DIAGNOSIS — R2689 Other abnormalities of gait and mobility: Secondary | ICD-10-CM | POA: Diagnosis not present

## 2016-02-15 DIAGNOSIS — R531 Weakness: Secondary | ICD-10-CM | POA: Diagnosis not present

## 2016-02-15 DIAGNOSIS — I5032 Chronic diastolic (congestive) heart failure: Secondary | ICD-10-CM | POA: Diagnosis not present

## 2016-02-15 DIAGNOSIS — M6281 Muscle weakness (generalized): Secondary | ICD-10-CM | POA: Diagnosis not present

## 2016-02-15 DIAGNOSIS — R278 Other lack of coordination: Secondary | ICD-10-CM | POA: Diagnosis not present

## 2016-02-16 DIAGNOSIS — I5032 Chronic diastolic (congestive) heart failure: Secondary | ICD-10-CM | POA: Diagnosis not present

## 2016-02-16 DIAGNOSIS — R278 Other lack of coordination: Secondary | ICD-10-CM | POA: Diagnosis not present

## 2016-02-16 DIAGNOSIS — R2689 Other abnormalities of gait and mobility: Secondary | ICD-10-CM | POA: Diagnosis not present

## 2016-02-16 DIAGNOSIS — R279 Unspecified lack of coordination: Secondary | ICD-10-CM | POA: Diagnosis not present

## 2016-02-16 DIAGNOSIS — R531 Weakness: Secondary | ICD-10-CM | POA: Diagnosis not present

## 2016-02-16 DIAGNOSIS — M6281 Muscle weakness (generalized): Secondary | ICD-10-CM | POA: Diagnosis not present

## 2016-02-17 DIAGNOSIS — F331 Major depressive disorder, recurrent, moderate: Secondary | ICD-10-CM | POA: Diagnosis not present

## 2016-02-19 DIAGNOSIS — R2689 Other abnormalities of gait and mobility: Secondary | ICD-10-CM | POA: Diagnosis not present

## 2016-02-19 DIAGNOSIS — R278 Other lack of coordination: Secondary | ICD-10-CM | POA: Diagnosis not present

## 2016-02-19 DIAGNOSIS — R531 Weakness: Secondary | ICD-10-CM | POA: Diagnosis not present

## 2016-02-19 DIAGNOSIS — R279 Unspecified lack of coordination: Secondary | ICD-10-CM | POA: Diagnosis not present

## 2016-02-19 DIAGNOSIS — I5032 Chronic diastolic (congestive) heart failure: Secondary | ICD-10-CM | POA: Diagnosis not present

## 2016-02-19 DIAGNOSIS — M6281 Muscle weakness (generalized): Secondary | ICD-10-CM | POA: Diagnosis not present

## 2016-02-20 DIAGNOSIS — R531 Weakness: Secondary | ICD-10-CM | POA: Diagnosis not present

## 2016-02-20 DIAGNOSIS — R279 Unspecified lack of coordination: Secondary | ICD-10-CM | POA: Diagnosis not present

## 2016-02-20 DIAGNOSIS — M6281 Muscle weakness (generalized): Secondary | ICD-10-CM | POA: Diagnosis not present

## 2016-02-20 DIAGNOSIS — R278 Other lack of coordination: Secondary | ICD-10-CM | POA: Diagnosis not present

## 2016-02-20 DIAGNOSIS — R2689 Other abnormalities of gait and mobility: Secondary | ICD-10-CM | POA: Diagnosis not present

## 2016-02-20 DIAGNOSIS — I5032 Chronic diastolic (congestive) heart failure: Secondary | ICD-10-CM | POA: Diagnosis not present

## 2016-02-21 DIAGNOSIS — M6281 Muscle weakness (generalized): Secondary | ICD-10-CM | POA: Diagnosis not present

## 2016-02-21 DIAGNOSIS — R531 Weakness: Secondary | ICD-10-CM | POA: Diagnosis not present

## 2016-02-21 DIAGNOSIS — R278 Other lack of coordination: Secondary | ICD-10-CM | POA: Diagnosis not present

## 2016-02-21 DIAGNOSIS — R279 Unspecified lack of coordination: Secondary | ICD-10-CM | POA: Diagnosis not present

## 2016-02-21 DIAGNOSIS — R2689 Other abnormalities of gait and mobility: Secondary | ICD-10-CM | POA: Diagnosis not present

## 2016-02-21 DIAGNOSIS — I5032 Chronic diastolic (congestive) heart failure: Secondary | ICD-10-CM | POA: Diagnosis not present

## 2016-02-22 DIAGNOSIS — R279 Unspecified lack of coordination: Secondary | ICD-10-CM | POA: Diagnosis not present

## 2016-02-22 DIAGNOSIS — R2689 Other abnormalities of gait and mobility: Secondary | ICD-10-CM | POA: Diagnosis not present

## 2016-02-22 DIAGNOSIS — R531 Weakness: Secondary | ICD-10-CM | POA: Diagnosis not present

## 2016-02-22 DIAGNOSIS — M6281 Muscle weakness (generalized): Secondary | ICD-10-CM | POA: Diagnosis not present

## 2016-02-22 DIAGNOSIS — R278 Other lack of coordination: Secondary | ICD-10-CM | POA: Diagnosis not present

## 2016-02-22 DIAGNOSIS — I5032 Chronic diastolic (congestive) heart failure: Secondary | ICD-10-CM | POA: Diagnosis not present

## 2016-02-23 DIAGNOSIS — R279 Unspecified lack of coordination: Secondary | ICD-10-CM | POA: Diagnosis not present

## 2016-02-23 DIAGNOSIS — R278 Other lack of coordination: Secondary | ICD-10-CM | POA: Diagnosis not present

## 2016-02-23 DIAGNOSIS — M6281 Muscle weakness (generalized): Secondary | ICD-10-CM | POA: Diagnosis not present

## 2016-02-23 DIAGNOSIS — R531 Weakness: Secondary | ICD-10-CM | POA: Diagnosis not present

## 2016-02-23 DIAGNOSIS — R2689 Other abnormalities of gait and mobility: Secondary | ICD-10-CM | POA: Diagnosis not present

## 2016-02-23 DIAGNOSIS — I5032 Chronic diastolic (congestive) heart failure: Secondary | ICD-10-CM | POA: Diagnosis not present

## 2016-02-24 DIAGNOSIS — F331 Major depressive disorder, recurrent, moderate: Secondary | ICD-10-CM | POA: Diagnosis not present

## 2016-02-26 DIAGNOSIS — I5032 Chronic diastolic (congestive) heart failure: Secondary | ICD-10-CM | POA: Diagnosis not present

## 2016-02-26 DIAGNOSIS — R279 Unspecified lack of coordination: Secondary | ICD-10-CM | POA: Diagnosis not present

## 2016-02-26 DIAGNOSIS — M6281 Muscle weakness (generalized): Secondary | ICD-10-CM | POA: Diagnosis not present

## 2016-02-26 DIAGNOSIS — R2689 Other abnormalities of gait and mobility: Secondary | ICD-10-CM | POA: Diagnosis not present

## 2016-02-26 DIAGNOSIS — R278 Other lack of coordination: Secondary | ICD-10-CM | POA: Diagnosis not present

## 2016-02-26 DIAGNOSIS — M1612 Unilateral primary osteoarthritis, left hip: Secondary | ICD-10-CM | POA: Diagnosis not present

## 2016-02-26 DIAGNOSIS — R531 Weakness: Secondary | ICD-10-CM | POA: Diagnosis not present

## 2016-02-27 DIAGNOSIS — R279 Unspecified lack of coordination: Secondary | ICD-10-CM | POA: Diagnosis not present

## 2016-02-27 DIAGNOSIS — I5032 Chronic diastolic (congestive) heart failure: Secondary | ICD-10-CM | POA: Diagnosis not present

## 2016-02-27 DIAGNOSIS — M6281 Muscle weakness (generalized): Secondary | ICD-10-CM | POA: Diagnosis not present

## 2016-02-27 DIAGNOSIS — R278 Other lack of coordination: Secondary | ICD-10-CM | POA: Diagnosis not present

## 2016-02-27 DIAGNOSIS — R2689 Other abnormalities of gait and mobility: Secondary | ICD-10-CM | POA: Diagnosis not present

## 2016-02-27 DIAGNOSIS — R531 Weakness: Secondary | ICD-10-CM | POA: Diagnosis not present

## 2016-02-28 DIAGNOSIS — R279 Unspecified lack of coordination: Secondary | ICD-10-CM | POA: Diagnosis not present

## 2016-02-28 DIAGNOSIS — I5032 Chronic diastolic (congestive) heart failure: Secondary | ICD-10-CM | POA: Diagnosis not present

## 2016-02-28 DIAGNOSIS — R278 Other lack of coordination: Secondary | ICD-10-CM | POA: Diagnosis not present

## 2016-02-28 DIAGNOSIS — M6281 Muscle weakness (generalized): Secondary | ICD-10-CM | POA: Diagnosis not present

## 2016-02-28 DIAGNOSIS — R531 Weakness: Secondary | ICD-10-CM | POA: Diagnosis not present

## 2016-02-28 DIAGNOSIS — R2689 Other abnormalities of gait and mobility: Secondary | ICD-10-CM | POA: Diagnosis not present

## 2016-02-29 DIAGNOSIS — R2689 Other abnormalities of gait and mobility: Secondary | ICD-10-CM | POA: Diagnosis not present

## 2016-02-29 DIAGNOSIS — R278 Other lack of coordination: Secondary | ICD-10-CM | POA: Diagnosis not present

## 2016-02-29 DIAGNOSIS — R531 Weakness: Secondary | ICD-10-CM | POA: Diagnosis not present

## 2016-02-29 DIAGNOSIS — I5032 Chronic diastolic (congestive) heart failure: Secondary | ICD-10-CM | POA: Diagnosis not present

## 2016-02-29 DIAGNOSIS — M6281 Muscle weakness (generalized): Secondary | ICD-10-CM | POA: Diagnosis not present

## 2016-02-29 DIAGNOSIS — R279 Unspecified lack of coordination: Secondary | ICD-10-CM | POA: Diagnosis not present

## 2016-03-01 DIAGNOSIS — R279 Unspecified lack of coordination: Secondary | ICD-10-CM | POA: Diagnosis not present

## 2016-03-01 DIAGNOSIS — R531 Weakness: Secondary | ICD-10-CM | POA: Diagnosis not present

## 2016-03-01 DIAGNOSIS — R2689 Other abnormalities of gait and mobility: Secondary | ICD-10-CM | POA: Diagnosis not present

## 2016-03-01 DIAGNOSIS — R278 Other lack of coordination: Secondary | ICD-10-CM | POA: Diagnosis not present

## 2016-03-01 DIAGNOSIS — M6281 Muscle weakness (generalized): Secondary | ICD-10-CM | POA: Diagnosis not present

## 2016-03-01 DIAGNOSIS — I5032 Chronic diastolic (congestive) heart failure: Secondary | ICD-10-CM | POA: Diagnosis not present

## 2016-03-02 DIAGNOSIS — R2689 Other abnormalities of gait and mobility: Secondary | ICD-10-CM | POA: Diagnosis not present

## 2016-03-02 DIAGNOSIS — I5032 Chronic diastolic (congestive) heart failure: Secondary | ICD-10-CM | POA: Diagnosis not present

## 2016-03-02 DIAGNOSIS — R278 Other lack of coordination: Secondary | ICD-10-CM | POA: Diagnosis not present

## 2016-03-02 DIAGNOSIS — R279 Unspecified lack of coordination: Secondary | ICD-10-CM | POA: Diagnosis not present

## 2016-03-02 DIAGNOSIS — M6281 Muscle weakness (generalized): Secondary | ICD-10-CM | POA: Diagnosis not present

## 2016-03-02 DIAGNOSIS — R531 Weakness: Secondary | ICD-10-CM | POA: Diagnosis not present

## 2016-03-04 DIAGNOSIS — R279 Unspecified lack of coordination: Secondary | ICD-10-CM | POA: Diagnosis not present

## 2016-03-04 DIAGNOSIS — R278 Other lack of coordination: Secondary | ICD-10-CM | POA: Diagnosis not present

## 2016-03-04 DIAGNOSIS — M6281 Muscle weakness (generalized): Secondary | ICD-10-CM | POA: Diagnosis not present

## 2016-03-04 DIAGNOSIS — R2689 Other abnormalities of gait and mobility: Secondary | ICD-10-CM | POA: Diagnosis not present

## 2016-03-04 DIAGNOSIS — I5032 Chronic diastolic (congestive) heart failure: Secondary | ICD-10-CM | POA: Diagnosis not present

## 2016-03-04 DIAGNOSIS — R531 Weakness: Secondary | ICD-10-CM | POA: Diagnosis not present

## 2016-03-05 DIAGNOSIS — M6281 Muscle weakness (generalized): Secondary | ICD-10-CM | POA: Diagnosis not present

## 2016-03-05 DIAGNOSIS — I5032 Chronic diastolic (congestive) heart failure: Secondary | ICD-10-CM | POA: Diagnosis not present

## 2016-03-05 DIAGNOSIS — R531 Weakness: Secondary | ICD-10-CM | POA: Diagnosis not present

## 2016-03-05 DIAGNOSIS — R2689 Other abnormalities of gait and mobility: Secondary | ICD-10-CM | POA: Diagnosis not present

## 2016-03-05 DIAGNOSIS — R279 Unspecified lack of coordination: Secondary | ICD-10-CM | POA: Diagnosis not present

## 2016-03-05 DIAGNOSIS — R278 Other lack of coordination: Secondary | ICD-10-CM | POA: Diagnosis not present

## 2016-03-06 DIAGNOSIS — I5032 Chronic diastolic (congestive) heart failure: Secondary | ICD-10-CM | POA: Diagnosis not present

## 2016-03-06 DIAGNOSIS — R279 Unspecified lack of coordination: Secondary | ICD-10-CM | POA: Diagnosis not present

## 2016-03-06 DIAGNOSIS — R531 Weakness: Secondary | ICD-10-CM | POA: Diagnosis not present

## 2016-03-06 DIAGNOSIS — R278 Other lack of coordination: Secondary | ICD-10-CM | POA: Diagnosis not present

## 2016-03-06 DIAGNOSIS — R2689 Other abnormalities of gait and mobility: Secondary | ICD-10-CM | POA: Diagnosis not present

## 2016-03-06 DIAGNOSIS — M6281 Muscle weakness (generalized): Secondary | ICD-10-CM | POA: Diagnosis not present

## 2016-03-07 DIAGNOSIS — R2689 Other abnormalities of gait and mobility: Secondary | ICD-10-CM | POA: Diagnosis not present

## 2016-03-07 DIAGNOSIS — M6281 Muscle weakness (generalized): Secondary | ICD-10-CM | POA: Diagnosis not present

## 2016-03-07 DIAGNOSIS — R531 Weakness: Secondary | ICD-10-CM | POA: Diagnosis not present

## 2016-03-07 DIAGNOSIS — I5032 Chronic diastolic (congestive) heart failure: Secondary | ICD-10-CM | POA: Diagnosis not present

## 2016-03-07 DIAGNOSIS — R279 Unspecified lack of coordination: Secondary | ICD-10-CM | POA: Diagnosis not present

## 2016-03-07 DIAGNOSIS — R278 Other lack of coordination: Secondary | ICD-10-CM | POA: Diagnosis not present

## 2016-03-08 DIAGNOSIS — R279 Unspecified lack of coordination: Secondary | ICD-10-CM | POA: Diagnosis not present

## 2016-03-08 DIAGNOSIS — I5032 Chronic diastolic (congestive) heart failure: Secondary | ICD-10-CM | POA: Diagnosis not present

## 2016-03-08 DIAGNOSIS — R2689 Other abnormalities of gait and mobility: Secondary | ICD-10-CM | POA: Diagnosis not present

## 2016-03-08 DIAGNOSIS — R278 Other lack of coordination: Secondary | ICD-10-CM | POA: Diagnosis not present

## 2016-03-08 DIAGNOSIS — M24542 Contracture, left hand: Secondary | ICD-10-CM | POA: Diagnosis not present

## 2016-03-08 DIAGNOSIS — M6281 Muscle weakness (generalized): Secondary | ICD-10-CM | POA: Diagnosis not present

## 2016-03-08 DIAGNOSIS — R531 Weakness: Secondary | ICD-10-CM | POA: Diagnosis not present

## 2016-03-10 DIAGNOSIS — R279 Unspecified lack of coordination: Secondary | ICD-10-CM | POA: Diagnosis not present

## 2016-03-10 DIAGNOSIS — M6281 Muscle weakness (generalized): Secondary | ICD-10-CM | POA: Diagnosis not present

## 2016-03-10 DIAGNOSIS — R278 Other lack of coordination: Secondary | ICD-10-CM | POA: Diagnosis not present

## 2016-03-10 DIAGNOSIS — R531 Weakness: Secondary | ICD-10-CM | POA: Diagnosis not present

## 2016-03-10 DIAGNOSIS — I5032 Chronic diastolic (congestive) heart failure: Secondary | ICD-10-CM | POA: Diagnosis not present

## 2016-03-10 DIAGNOSIS — R2689 Other abnormalities of gait and mobility: Secondary | ICD-10-CM | POA: Diagnosis not present

## 2016-03-11 DIAGNOSIS — R279 Unspecified lack of coordination: Secondary | ICD-10-CM | POA: Diagnosis not present

## 2016-03-11 DIAGNOSIS — R2689 Other abnormalities of gait and mobility: Secondary | ICD-10-CM | POA: Diagnosis not present

## 2016-03-11 DIAGNOSIS — R278 Other lack of coordination: Secondary | ICD-10-CM | POA: Diagnosis not present

## 2016-03-11 DIAGNOSIS — M6281 Muscle weakness (generalized): Secondary | ICD-10-CM | POA: Diagnosis not present

## 2016-03-11 DIAGNOSIS — R531 Weakness: Secondary | ICD-10-CM | POA: Diagnosis not present

## 2016-03-11 DIAGNOSIS — I5032 Chronic diastolic (congestive) heart failure: Secondary | ICD-10-CM | POA: Diagnosis not present

## 2016-03-12 DIAGNOSIS — R2689 Other abnormalities of gait and mobility: Secondary | ICD-10-CM | POA: Diagnosis not present

## 2016-03-12 DIAGNOSIS — R279 Unspecified lack of coordination: Secondary | ICD-10-CM | POA: Diagnosis not present

## 2016-03-12 DIAGNOSIS — I5032 Chronic diastolic (congestive) heart failure: Secondary | ICD-10-CM | POA: Diagnosis not present

## 2016-03-12 DIAGNOSIS — R278 Other lack of coordination: Secondary | ICD-10-CM | POA: Diagnosis not present

## 2016-03-12 DIAGNOSIS — M6281 Muscle weakness (generalized): Secondary | ICD-10-CM | POA: Diagnosis not present

## 2016-03-12 DIAGNOSIS — R531 Weakness: Secondary | ICD-10-CM | POA: Diagnosis not present

## 2016-03-13 DIAGNOSIS — I5032 Chronic diastolic (congestive) heart failure: Secondary | ICD-10-CM | POA: Diagnosis not present

## 2016-03-13 DIAGNOSIS — R2689 Other abnormalities of gait and mobility: Secondary | ICD-10-CM | POA: Diagnosis not present

## 2016-03-13 DIAGNOSIS — M6281 Muscle weakness (generalized): Secondary | ICD-10-CM | POA: Diagnosis not present

## 2016-03-13 DIAGNOSIS — R278 Other lack of coordination: Secondary | ICD-10-CM | POA: Diagnosis not present

## 2016-03-13 DIAGNOSIS — R279 Unspecified lack of coordination: Secondary | ICD-10-CM | POA: Diagnosis not present

## 2016-03-13 DIAGNOSIS — R531 Weakness: Secondary | ICD-10-CM | POA: Diagnosis not present

## 2016-03-14 DIAGNOSIS — R278 Other lack of coordination: Secondary | ICD-10-CM | POA: Diagnosis not present

## 2016-03-14 DIAGNOSIS — R279 Unspecified lack of coordination: Secondary | ICD-10-CM | POA: Diagnosis not present

## 2016-03-14 DIAGNOSIS — M6281 Muscle weakness (generalized): Secondary | ICD-10-CM | POA: Diagnosis not present

## 2016-03-14 DIAGNOSIS — R2689 Other abnormalities of gait and mobility: Secondary | ICD-10-CM | POA: Diagnosis not present

## 2016-03-14 DIAGNOSIS — R531 Weakness: Secondary | ICD-10-CM | POA: Diagnosis not present

## 2016-03-14 DIAGNOSIS — I5032 Chronic diastolic (congestive) heart failure: Secondary | ICD-10-CM | POA: Diagnosis not present

## 2016-03-15 DIAGNOSIS — M6281 Muscle weakness (generalized): Secondary | ICD-10-CM | POA: Diagnosis not present

## 2016-03-15 DIAGNOSIS — R2689 Other abnormalities of gait and mobility: Secondary | ICD-10-CM | POA: Diagnosis not present

## 2016-03-15 DIAGNOSIS — R531 Weakness: Secondary | ICD-10-CM | POA: Diagnosis not present

## 2016-03-15 DIAGNOSIS — I5032 Chronic diastolic (congestive) heart failure: Secondary | ICD-10-CM | POA: Diagnosis not present

## 2016-03-15 DIAGNOSIS — R279 Unspecified lack of coordination: Secondary | ICD-10-CM | POA: Diagnosis not present

## 2016-03-15 DIAGNOSIS — R278 Other lack of coordination: Secondary | ICD-10-CM | POA: Diagnosis not present

## 2016-03-18 DIAGNOSIS — K219 Gastro-esophageal reflux disease without esophagitis: Secondary | ICD-10-CM | POA: Diagnosis not present

## 2016-03-18 DIAGNOSIS — J441 Chronic obstructive pulmonary disease with (acute) exacerbation: Secondary | ICD-10-CM | POA: Diagnosis not present

## 2016-03-18 DIAGNOSIS — R2689 Other abnormalities of gait and mobility: Secondary | ICD-10-CM | POA: Diagnosis not present

## 2016-03-18 DIAGNOSIS — R531 Weakness: Secondary | ICD-10-CM | POA: Diagnosis not present

## 2016-03-18 DIAGNOSIS — I5022 Chronic systolic (congestive) heart failure: Secondary | ICD-10-CM | POA: Diagnosis not present

## 2016-03-18 DIAGNOSIS — R278 Other lack of coordination: Secondary | ICD-10-CM | POA: Diagnosis not present

## 2016-03-18 DIAGNOSIS — I5032 Chronic diastolic (congestive) heart failure: Secondary | ICD-10-CM | POA: Diagnosis not present

## 2016-03-18 DIAGNOSIS — R279 Unspecified lack of coordination: Secondary | ICD-10-CM | POA: Diagnosis not present

## 2016-03-18 DIAGNOSIS — M6281 Muscle weakness (generalized): Secondary | ICD-10-CM | POA: Diagnosis not present

## 2016-03-19 DIAGNOSIS — I5032 Chronic diastolic (congestive) heart failure: Secondary | ICD-10-CM | POA: Diagnosis not present

## 2016-03-19 DIAGNOSIS — R279 Unspecified lack of coordination: Secondary | ICD-10-CM | POA: Diagnosis not present

## 2016-03-19 DIAGNOSIS — R278 Other lack of coordination: Secondary | ICD-10-CM | POA: Diagnosis not present

## 2016-03-19 DIAGNOSIS — M6281 Muscle weakness (generalized): Secondary | ICD-10-CM | POA: Diagnosis not present

## 2016-03-19 DIAGNOSIS — R2689 Other abnormalities of gait and mobility: Secondary | ICD-10-CM | POA: Diagnosis not present

## 2016-03-19 DIAGNOSIS — R531 Weakness: Secondary | ICD-10-CM | POA: Diagnosis not present

## 2016-03-20 DIAGNOSIS — M6281 Muscle weakness (generalized): Secondary | ICD-10-CM | POA: Diagnosis not present

## 2016-03-20 DIAGNOSIS — R2689 Other abnormalities of gait and mobility: Secondary | ICD-10-CM | POA: Diagnosis not present

## 2016-03-20 DIAGNOSIS — R531 Weakness: Secondary | ICD-10-CM | POA: Diagnosis not present

## 2016-03-20 DIAGNOSIS — I5032 Chronic diastolic (congestive) heart failure: Secondary | ICD-10-CM | POA: Diagnosis not present

## 2016-03-20 DIAGNOSIS — R278 Other lack of coordination: Secondary | ICD-10-CM | POA: Diagnosis not present

## 2016-03-20 DIAGNOSIS — R279 Unspecified lack of coordination: Secondary | ICD-10-CM | POA: Diagnosis not present

## 2016-03-21 DIAGNOSIS — R531 Weakness: Secondary | ICD-10-CM | POA: Diagnosis not present

## 2016-03-21 DIAGNOSIS — R2689 Other abnormalities of gait and mobility: Secondary | ICD-10-CM | POA: Diagnosis not present

## 2016-03-21 DIAGNOSIS — R278 Other lack of coordination: Secondary | ICD-10-CM | POA: Diagnosis not present

## 2016-03-21 DIAGNOSIS — M6281 Muscle weakness (generalized): Secondary | ICD-10-CM | POA: Diagnosis not present

## 2016-03-21 DIAGNOSIS — R279 Unspecified lack of coordination: Secondary | ICD-10-CM | POA: Diagnosis not present

## 2016-03-21 DIAGNOSIS — I5032 Chronic diastolic (congestive) heart failure: Secondary | ICD-10-CM | POA: Diagnosis not present

## 2016-03-22 DIAGNOSIS — R531 Weakness: Secondary | ICD-10-CM | POA: Diagnosis not present

## 2016-03-22 DIAGNOSIS — I5032 Chronic diastolic (congestive) heart failure: Secondary | ICD-10-CM | POA: Diagnosis not present

## 2016-03-22 DIAGNOSIS — R2689 Other abnormalities of gait and mobility: Secondary | ICD-10-CM | POA: Diagnosis not present

## 2016-03-22 DIAGNOSIS — R278 Other lack of coordination: Secondary | ICD-10-CM | POA: Diagnosis not present

## 2016-03-22 DIAGNOSIS — R279 Unspecified lack of coordination: Secondary | ICD-10-CM | POA: Diagnosis not present

## 2016-03-22 DIAGNOSIS — M6281 Muscle weakness (generalized): Secondary | ICD-10-CM | POA: Diagnosis not present

## 2016-03-25 DIAGNOSIS — R531 Weakness: Secondary | ICD-10-CM | POA: Diagnosis not present

## 2016-03-25 DIAGNOSIS — I5032 Chronic diastolic (congestive) heart failure: Secondary | ICD-10-CM | POA: Diagnosis not present

## 2016-03-25 DIAGNOSIS — R279 Unspecified lack of coordination: Secondary | ICD-10-CM | POA: Diagnosis not present

## 2016-03-25 DIAGNOSIS — R2689 Other abnormalities of gait and mobility: Secondary | ICD-10-CM | POA: Diagnosis not present

## 2016-03-25 DIAGNOSIS — M6281 Muscle weakness (generalized): Secondary | ICD-10-CM | POA: Diagnosis not present

## 2016-03-25 DIAGNOSIS — R278 Other lack of coordination: Secondary | ICD-10-CM | POA: Diagnosis not present

## 2016-03-26 DIAGNOSIS — R2689 Other abnormalities of gait and mobility: Secondary | ICD-10-CM | POA: Diagnosis not present

## 2016-03-26 DIAGNOSIS — R278 Other lack of coordination: Secondary | ICD-10-CM | POA: Diagnosis not present

## 2016-03-26 DIAGNOSIS — M6281 Muscle weakness (generalized): Secondary | ICD-10-CM | POA: Diagnosis not present

## 2016-03-26 DIAGNOSIS — R531 Weakness: Secondary | ICD-10-CM | POA: Diagnosis not present

## 2016-03-26 DIAGNOSIS — I5032 Chronic diastolic (congestive) heart failure: Secondary | ICD-10-CM | POA: Diagnosis not present

## 2016-03-26 DIAGNOSIS — R279 Unspecified lack of coordination: Secondary | ICD-10-CM | POA: Diagnosis not present

## 2016-03-27 DIAGNOSIS — I5032 Chronic diastolic (congestive) heart failure: Secondary | ICD-10-CM | POA: Diagnosis not present

## 2016-03-27 DIAGNOSIS — R2689 Other abnormalities of gait and mobility: Secondary | ICD-10-CM | POA: Diagnosis not present

## 2016-03-27 DIAGNOSIS — R279 Unspecified lack of coordination: Secondary | ICD-10-CM | POA: Diagnosis not present

## 2016-03-27 DIAGNOSIS — R278 Other lack of coordination: Secondary | ICD-10-CM | POA: Diagnosis not present

## 2016-03-27 DIAGNOSIS — R531 Weakness: Secondary | ICD-10-CM | POA: Diagnosis not present

## 2016-03-27 DIAGNOSIS — M6281 Muscle weakness (generalized): Secondary | ICD-10-CM | POA: Diagnosis not present

## 2016-03-28 DIAGNOSIS — R531 Weakness: Secondary | ICD-10-CM | POA: Diagnosis not present

## 2016-03-28 DIAGNOSIS — R278 Other lack of coordination: Secondary | ICD-10-CM | POA: Diagnosis not present

## 2016-03-28 DIAGNOSIS — I5032 Chronic diastolic (congestive) heart failure: Secondary | ICD-10-CM | POA: Diagnosis not present

## 2016-03-28 DIAGNOSIS — R279 Unspecified lack of coordination: Secondary | ICD-10-CM | POA: Diagnosis not present

## 2016-03-28 DIAGNOSIS — R2689 Other abnormalities of gait and mobility: Secondary | ICD-10-CM | POA: Diagnosis not present

## 2016-03-28 DIAGNOSIS — M6281 Muscle weakness (generalized): Secondary | ICD-10-CM | POA: Diagnosis not present

## 2016-03-29 DIAGNOSIS — I5032 Chronic diastolic (congestive) heart failure: Secondary | ICD-10-CM | POA: Diagnosis not present

## 2016-03-29 DIAGNOSIS — R531 Weakness: Secondary | ICD-10-CM | POA: Diagnosis not present

## 2016-03-29 DIAGNOSIS — R279 Unspecified lack of coordination: Secondary | ICD-10-CM | POA: Diagnosis not present

## 2016-03-29 DIAGNOSIS — M6281 Muscle weakness (generalized): Secondary | ICD-10-CM | POA: Diagnosis not present

## 2016-03-29 DIAGNOSIS — R2689 Other abnormalities of gait and mobility: Secondary | ICD-10-CM | POA: Diagnosis not present

## 2016-03-29 DIAGNOSIS — R278 Other lack of coordination: Secondary | ICD-10-CM | POA: Diagnosis not present

## 2016-03-31 DIAGNOSIS — R2689 Other abnormalities of gait and mobility: Secondary | ICD-10-CM | POA: Diagnosis not present

## 2016-03-31 DIAGNOSIS — M6281 Muscle weakness (generalized): Secondary | ICD-10-CM | POA: Diagnosis not present

## 2016-03-31 DIAGNOSIS — R279 Unspecified lack of coordination: Secondary | ICD-10-CM | POA: Diagnosis not present

## 2016-03-31 DIAGNOSIS — R278 Other lack of coordination: Secondary | ICD-10-CM | POA: Diagnosis not present

## 2016-03-31 DIAGNOSIS — R531 Weakness: Secondary | ICD-10-CM | POA: Diagnosis not present

## 2016-03-31 DIAGNOSIS — I5032 Chronic diastolic (congestive) heart failure: Secondary | ICD-10-CM | POA: Diagnosis not present

## 2016-05-02 DIAGNOSIS — I5022 Chronic systolic (congestive) heart failure: Secondary | ICD-10-CM | POA: Diagnosis not present

## 2016-05-02 DIAGNOSIS — J441 Chronic obstructive pulmonary disease with (acute) exacerbation: Secondary | ICD-10-CM | POA: Diagnosis not present

## 2016-05-02 DIAGNOSIS — K219 Gastro-esophageal reflux disease without esophagitis: Secondary | ICD-10-CM | POA: Diagnosis not present

## 2016-05-06 DIAGNOSIS — F39 Unspecified mood [affective] disorder: Secondary | ICD-10-CM | POA: Diagnosis not present

## 2016-05-06 DIAGNOSIS — F419 Anxiety disorder, unspecified: Secondary | ICD-10-CM | POA: Diagnosis not present

## 2016-05-10 DIAGNOSIS — F331 Major depressive disorder, recurrent, moderate: Secondary | ICD-10-CM | POA: Diagnosis not present

## 2016-05-12 DIAGNOSIS — F331 Major depressive disorder, recurrent, moderate: Secondary | ICD-10-CM | POA: Diagnosis not present

## 2016-05-23 DIAGNOSIS — F331 Major depressive disorder, recurrent, moderate: Secondary | ICD-10-CM | POA: Diagnosis not present

## 2016-05-28 DIAGNOSIS — I739 Peripheral vascular disease, unspecified: Secondary | ICD-10-CM | POA: Diagnosis not present

## 2016-05-30 DIAGNOSIS — F331 Major depressive disorder, recurrent, moderate: Secondary | ICD-10-CM | POA: Diagnosis not present

## 2016-06-02 DIAGNOSIS — F331 Major depressive disorder, recurrent, moderate: Secondary | ICD-10-CM | POA: Diagnosis not present

## 2016-06-09 DIAGNOSIS — F331 Major depressive disorder, recurrent, moderate: Secondary | ICD-10-CM | POA: Diagnosis not present

## 2016-06-15 DIAGNOSIS — F331 Major depressive disorder, recurrent, moderate: Secondary | ICD-10-CM | POA: Diagnosis not present

## 2016-06-17 DIAGNOSIS — I5022 Chronic systolic (congestive) heart failure: Secondary | ICD-10-CM | POA: Diagnosis not present

## 2016-06-17 DIAGNOSIS — K219 Gastro-esophageal reflux disease without esophagitis: Secondary | ICD-10-CM | POA: Diagnosis not present

## 2016-06-17 DIAGNOSIS — J441 Chronic obstructive pulmonary disease with (acute) exacerbation: Secondary | ICD-10-CM | POA: Diagnosis not present

## 2016-06-22 DIAGNOSIS — F331 Major depressive disorder, recurrent, moderate: Secondary | ICD-10-CM | POA: Diagnosis not present

## 2016-06-24 DIAGNOSIS — F331 Major depressive disorder, recurrent, moderate: Secondary | ICD-10-CM | POA: Diagnosis not present

## 2016-06-30 DIAGNOSIS — F331 Major depressive disorder, recurrent, moderate: Secondary | ICD-10-CM | POA: Diagnosis not present

## 2016-07-03 DIAGNOSIS — R7301 Impaired fasting glucose: Secondary | ICD-10-CM | POA: Diagnosis not present

## 2016-07-03 DIAGNOSIS — N390 Urinary tract infection, site not specified: Secondary | ICD-10-CM | POA: Diagnosis not present

## 2016-07-04 DIAGNOSIS — I10 Essential (primary) hypertension: Secondary | ICD-10-CM | POA: Diagnosis not present

## 2016-07-04 IMAGING — CT CT HEAD W/O CM
1 series · 15 of 30 positions shown, 19 images · non-contrast
Comparison: CT of the head performed 08/08/2014

CLINICAL DATA: Acute onset of confusion.  Initial encounter.

EXAM:
CT HEAD WITHOUT CONTRAST
TECHNIQUE: Contiguous axial images were obtained from the base of the skull
through the vertex without intravenous contrast.

[Series 2: headseq 4.8 h37s · axial · 0.43mm/px · z∈[+1120,+1275]mm · 15 of 36 slices shown, 19 images]
[im 2/36  brain]
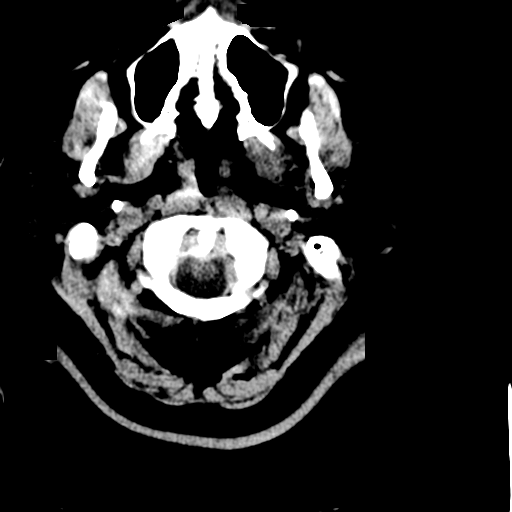
[im 2/36  bone]
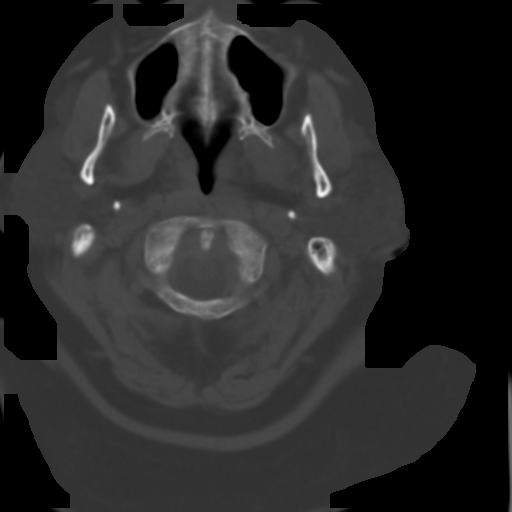
[im 4/36  brain]
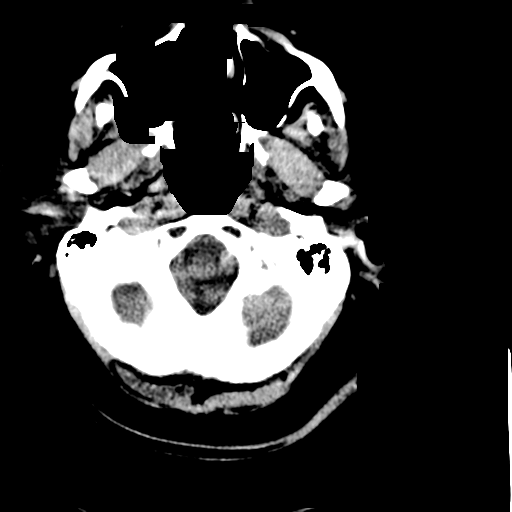
[im 7/36  brain]
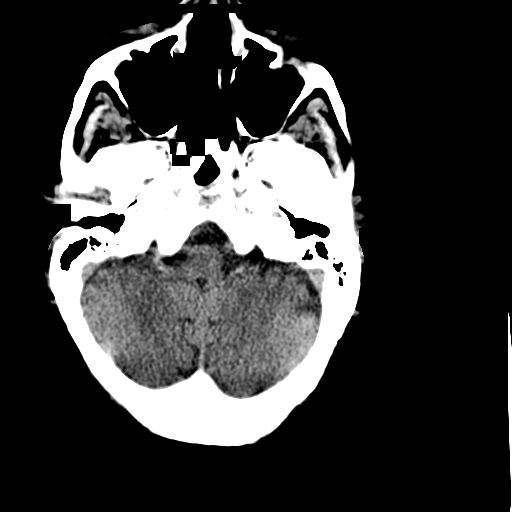
[im 9/36  brain]
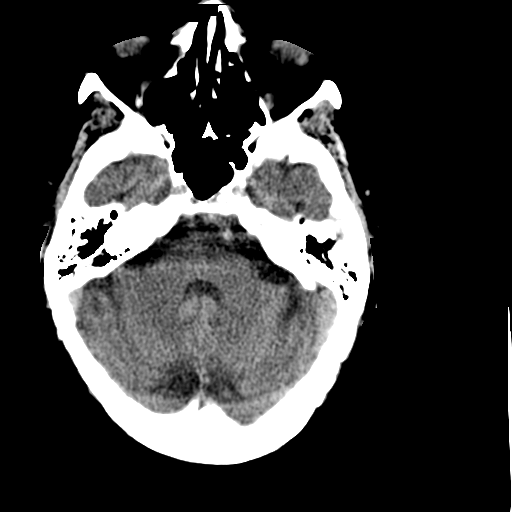
[im 11/36  brain]
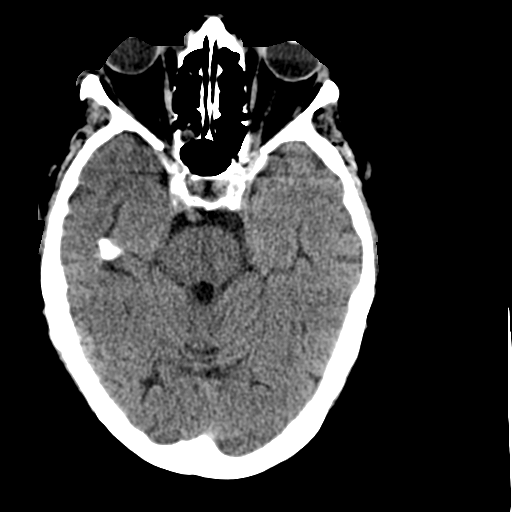
[im 11/36  bone]
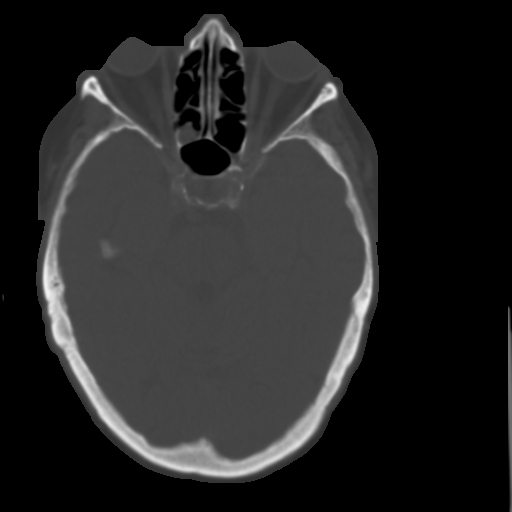
[im 14/36  brain]
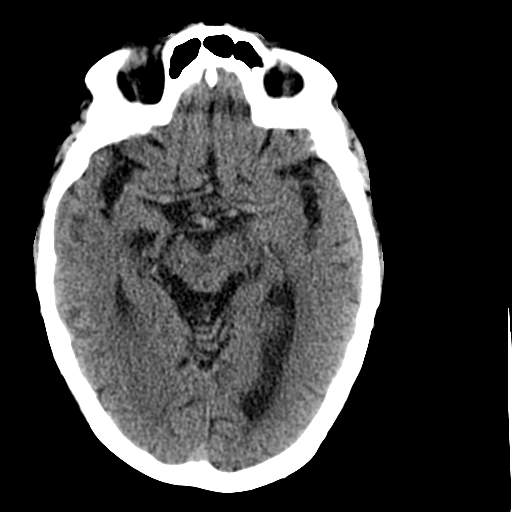
[im 16/36  brain]
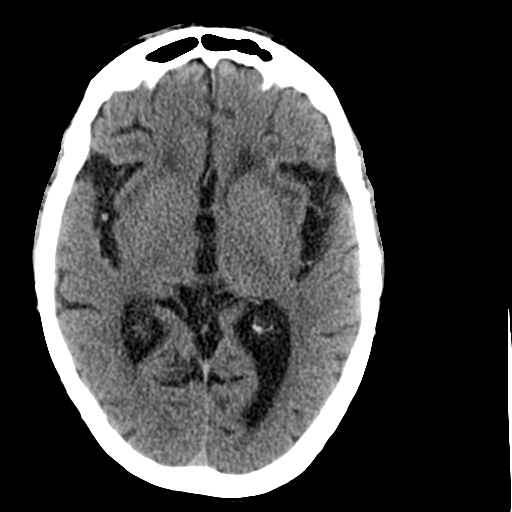
[im 19/36  brain]
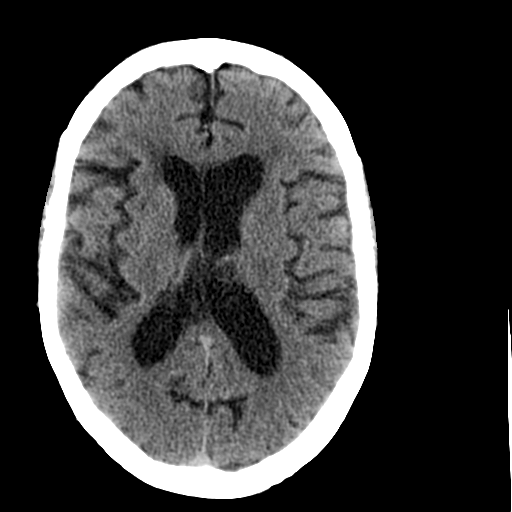
[im 20/36  brain]
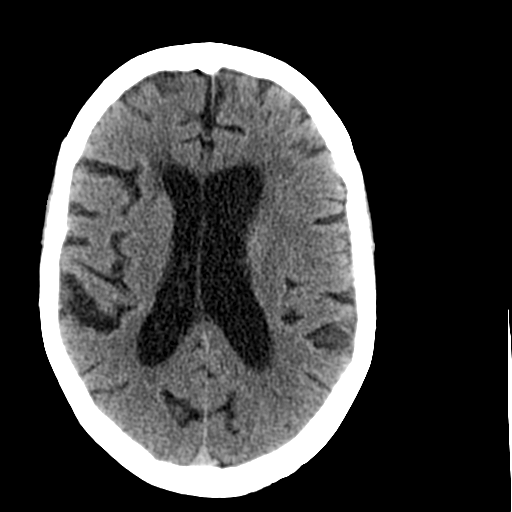
[im 20/36  bone]
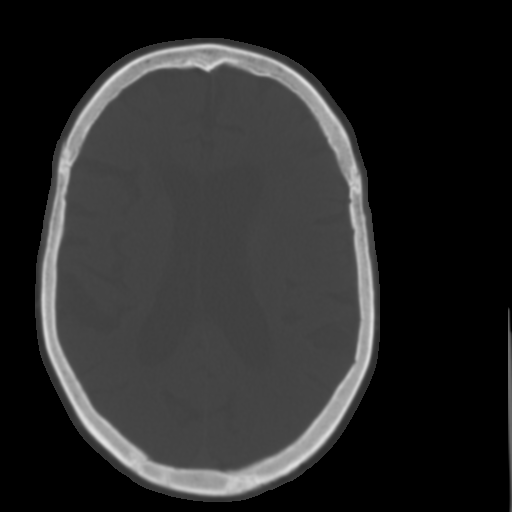
[im 22/36  brain]
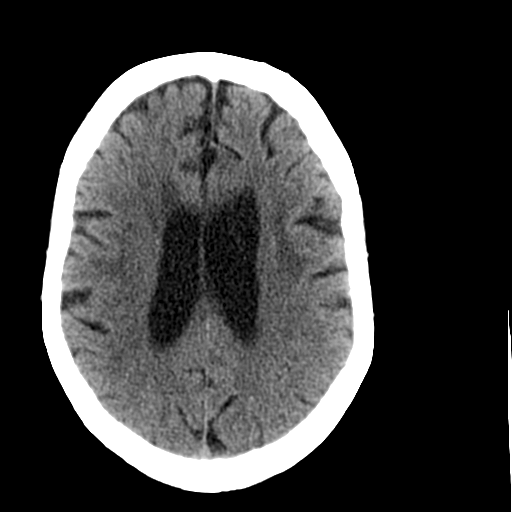
[im 25/36  brain]
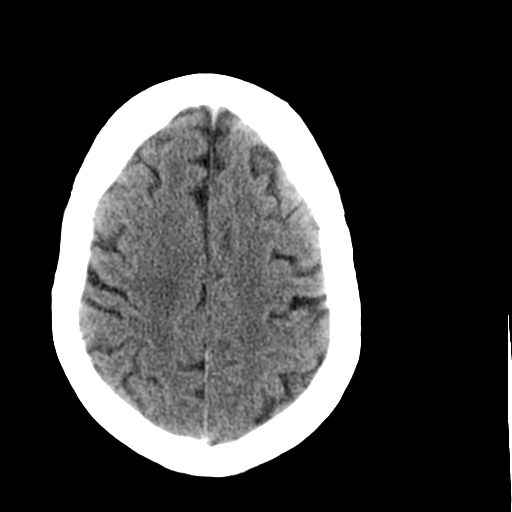
[im 27/36  brain]
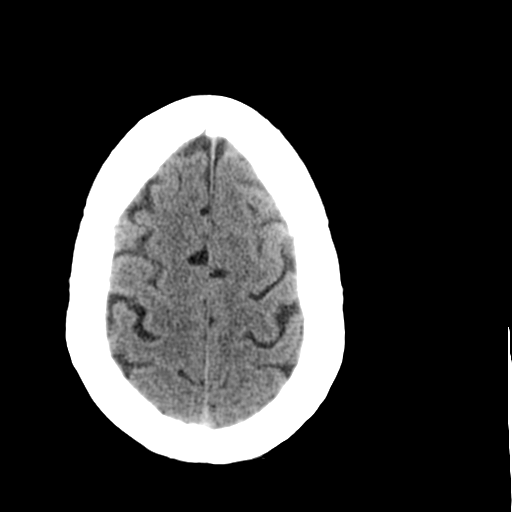
[im 29/36  brain]
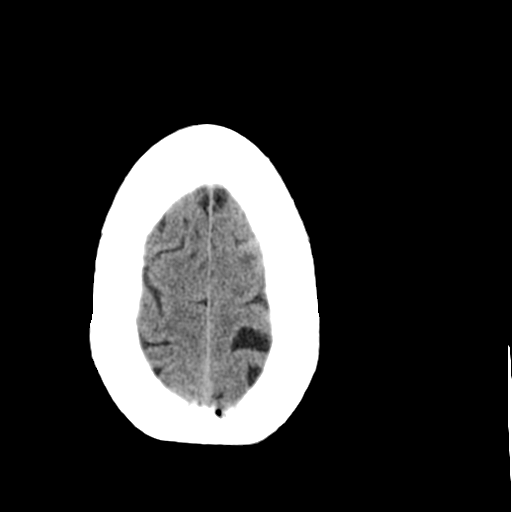
[im 29/36  bone]
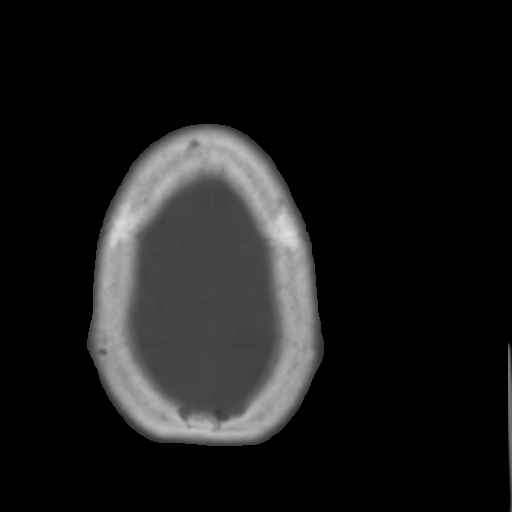
[im 32/36  brain]
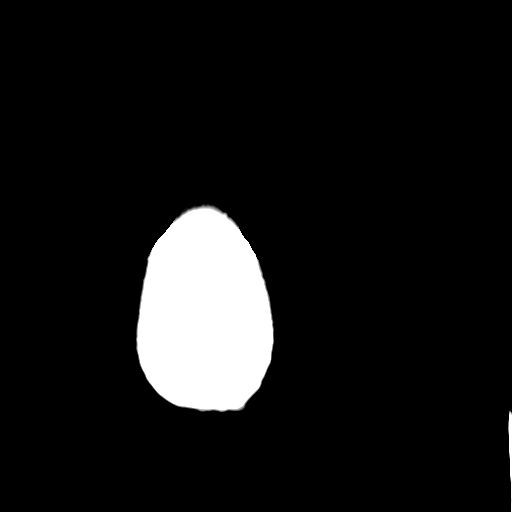
[im 34/36  brain]
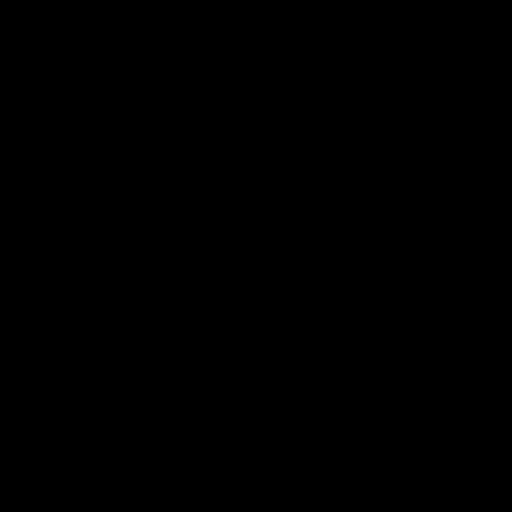

[15 of 30 positions shown; findings below may reference images not displayed]

FINDINGS: There is no evidence of acute infarction, mass lesion, or intra- or
extra-axial hemorrhage on CT.

Prominence of the ventricles and sulci reflects moderate cortical
volume loss. Mild cerebellar atrophy is noted. Scattered
periventricular and subcortical white matter change likely reflects
small vessel ischemic microangiopathy.

The brainstem and fourth ventricle are within normal limits. The
basal ganglia are unremarkable in appearance. The cerebral
hemispheres demonstrate grossly normal gray-white differentiation.
No mass effect or midline shift is seen.

There is no evidence of fracture; visualized osseous structures are
unremarkable in appearance. The orbits are within normal limits. The
paranasal sinuses and mastoid air cells are well-aerated. No
significant soft tissue abnormalities are seen.
IMPRESSION: 1. No acute intracranial pathology seen on CT.
2. Moderate cortical volume loss and scattered small vessel ischemic
microangiopathy.

## 2016-07-07 DIAGNOSIS — F331 Major depressive disorder, recurrent, moderate: Secondary | ICD-10-CM | POA: Diagnosis not present

## 2016-07-14 DIAGNOSIS — F331 Major depressive disorder, recurrent, moderate: Secondary | ICD-10-CM | POA: Diagnosis not present

## 2016-07-22 DIAGNOSIS — F331 Major depressive disorder, recurrent, moderate: Secondary | ICD-10-CM | POA: Diagnosis not present

## 2016-07-31 DIAGNOSIS — F419 Anxiety disorder, unspecified: Secondary | ICD-10-CM | POA: Diagnosis not present

## 2016-07-31 DIAGNOSIS — F39 Unspecified mood [affective] disorder: Secondary | ICD-10-CM | POA: Diagnosis not present

## 2016-08-01 DIAGNOSIS — I5032 Chronic diastolic (congestive) heart failure: Secondary | ICD-10-CM | POA: Diagnosis not present

## 2016-08-01 DIAGNOSIS — R2689 Other abnormalities of gait and mobility: Secondary | ICD-10-CM | POA: Diagnosis not present

## 2016-08-01 DIAGNOSIS — E079 Disorder of thyroid, unspecified: Secondary | ICD-10-CM | POA: Diagnosis not present

## 2016-08-01 DIAGNOSIS — I10 Essential (primary) hypertension: Secondary | ICD-10-CM | POA: Diagnosis not present

## 2016-08-09 DIAGNOSIS — F339 Major depressive disorder, recurrent, unspecified: Secondary | ICD-10-CM | POA: Diagnosis not present

## 2016-08-09 DIAGNOSIS — F419 Anxiety disorder, unspecified: Secondary | ICD-10-CM | POA: Diagnosis not present

## 2016-08-16 DIAGNOSIS — L600 Ingrowing nail: Secondary | ICD-10-CM | POA: Diagnosis not present

## 2016-08-22 DIAGNOSIS — R279 Unspecified lack of coordination: Secondary | ICD-10-CM | POA: Diagnosis not present

## 2016-08-22 DIAGNOSIS — R531 Weakness: Secondary | ICD-10-CM | POA: Diagnosis not present

## 2016-08-22 DIAGNOSIS — R2689 Other abnormalities of gait and mobility: Secondary | ICD-10-CM | POA: Diagnosis not present

## 2016-08-22 DIAGNOSIS — R278 Other lack of coordination: Secondary | ICD-10-CM | POA: Diagnosis not present

## 2016-08-22 DIAGNOSIS — M6281 Muscle weakness (generalized): Secondary | ICD-10-CM | POA: Diagnosis not present

## 2016-08-22 DIAGNOSIS — I5032 Chronic diastolic (congestive) heart failure: Secondary | ICD-10-CM | POA: Diagnosis not present

## 2016-08-29 DIAGNOSIS — I10 Essential (primary) hypertension: Secondary | ICD-10-CM | POA: Diagnosis not present

## 2016-08-29 DIAGNOSIS — E079 Disorder of thyroid, unspecified: Secondary | ICD-10-CM | POA: Diagnosis not present

## 2016-08-29 DIAGNOSIS — R2689 Other abnormalities of gait and mobility: Secondary | ICD-10-CM | POA: Diagnosis not present

## 2016-08-29 DIAGNOSIS — I5032 Chronic diastolic (congestive) heart failure: Secondary | ICD-10-CM | POA: Diagnosis not present

## 2016-09-26 DIAGNOSIS — I5032 Chronic diastolic (congestive) heart failure: Secondary | ICD-10-CM | POA: Diagnosis not present

## 2016-09-26 DIAGNOSIS — F339 Major depressive disorder, recurrent, unspecified: Secondary | ICD-10-CM | POA: Diagnosis not present

## 2016-09-26 DIAGNOSIS — I10 Essential (primary) hypertension: Secondary | ICD-10-CM | POA: Diagnosis not present

## 2016-09-26 DIAGNOSIS — E079 Disorder of thyroid, unspecified: Secondary | ICD-10-CM | POA: Diagnosis not present

## 2016-10-14 DIAGNOSIS — M6281 Muscle weakness (generalized): Secondary | ICD-10-CM | POA: Diagnosis not present

## 2016-10-14 DIAGNOSIS — R2689 Other abnormalities of gait and mobility: Secondary | ICD-10-CM | POA: Diagnosis not present

## 2016-10-14 DIAGNOSIS — R278 Other lack of coordination: Secondary | ICD-10-CM | POA: Diagnosis not present

## 2016-10-14 DIAGNOSIS — R531 Weakness: Secondary | ICD-10-CM | POA: Diagnosis not present

## 2016-10-14 DIAGNOSIS — R279 Unspecified lack of coordination: Secondary | ICD-10-CM | POA: Diagnosis not present

## 2016-10-14 DIAGNOSIS — I5032 Chronic diastolic (congestive) heart failure: Secondary | ICD-10-CM | POA: Diagnosis not present

## 2016-10-15 DIAGNOSIS — R279 Unspecified lack of coordination: Secondary | ICD-10-CM | POA: Diagnosis not present

## 2016-10-15 DIAGNOSIS — R531 Weakness: Secondary | ICD-10-CM | POA: Diagnosis not present

## 2016-10-15 DIAGNOSIS — M6281 Muscle weakness (generalized): Secondary | ICD-10-CM | POA: Diagnosis not present

## 2016-10-15 DIAGNOSIS — R278 Other lack of coordination: Secondary | ICD-10-CM | POA: Diagnosis not present

## 2016-10-15 DIAGNOSIS — I5032 Chronic diastolic (congestive) heart failure: Secondary | ICD-10-CM | POA: Diagnosis not present

## 2016-10-15 DIAGNOSIS — R2689 Other abnormalities of gait and mobility: Secondary | ICD-10-CM | POA: Diagnosis not present

## 2016-10-16 DIAGNOSIS — R2689 Other abnormalities of gait and mobility: Secondary | ICD-10-CM | POA: Diagnosis not present

## 2016-10-16 DIAGNOSIS — R531 Weakness: Secondary | ICD-10-CM | POA: Diagnosis not present

## 2016-10-16 DIAGNOSIS — R278 Other lack of coordination: Secondary | ICD-10-CM | POA: Diagnosis not present

## 2016-10-16 DIAGNOSIS — I5032 Chronic diastolic (congestive) heart failure: Secondary | ICD-10-CM | POA: Diagnosis not present

## 2016-10-16 DIAGNOSIS — R279 Unspecified lack of coordination: Secondary | ICD-10-CM | POA: Diagnosis not present

## 2016-10-16 DIAGNOSIS — M6281 Muscle weakness (generalized): Secondary | ICD-10-CM | POA: Diagnosis not present

## 2016-10-17 DIAGNOSIS — R531 Weakness: Secondary | ICD-10-CM | POA: Diagnosis not present

## 2016-10-17 DIAGNOSIS — R279 Unspecified lack of coordination: Secondary | ICD-10-CM | POA: Diagnosis not present

## 2016-10-17 DIAGNOSIS — R2689 Other abnormalities of gait and mobility: Secondary | ICD-10-CM | POA: Diagnosis not present

## 2016-10-17 DIAGNOSIS — M6281 Muscle weakness (generalized): Secondary | ICD-10-CM | POA: Diagnosis not present

## 2016-10-17 DIAGNOSIS — R278 Other lack of coordination: Secondary | ICD-10-CM | POA: Diagnosis not present

## 2016-10-17 DIAGNOSIS — I5032 Chronic diastolic (congestive) heart failure: Secondary | ICD-10-CM | POA: Diagnosis not present

## 2016-10-18 DIAGNOSIS — R2689 Other abnormalities of gait and mobility: Secondary | ICD-10-CM | POA: Diagnosis not present

## 2016-10-18 DIAGNOSIS — R279 Unspecified lack of coordination: Secondary | ICD-10-CM | POA: Diagnosis not present

## 2016-10-18 DIAGNOSIS — R278 Other lack of coordination: Secondary | ICD-10-CM | POA: Diagnosis not present

## 2016-10-18 DIAGNOSIS — M6281 Muscle weakness (generalized): Secondary | ICD-10-CM | POA: Diagnosis not present

## 2016-10-18 DIAGNOSIS — I5032 Chronic diastolic (congestive) heart failure: Secondary | ICD-10-CM | POA: Diagnosis not present

## 2016-10-18 DIAGNOSIS — R531 Weakness: Secondary | ICD-10-CM | POA: Diagnosis not present

## 2016-10-21 DIAGNOSIS — I10 Essential (primary) hypertension: Secondary | ICD-10-CM | POA: Diagnosis not present

## 2016-10-21 DIAGNOSIS — I7389 Other specified peripheral vascular diseases: Secondary | ICD-10-CM | POA: Diagnosis not present

## 2016-10-21 DIAGNOSIS — M6281 Muscle weakness (generalized): Secondary | ICD-10-CM | POA: Diagnosis not present

## 2016-10-21 DIAGNOSIS — I5032 Chronic diastolic (congestive) heart failure: Secondary | ICD-10-CM | POA: Diagnosis not present

## 2016-10-21 DIAGNOSIS — R278 Other lack of coordination: Secondary | ICD-10-CM | POA: Diagnosis not present

## 2016-10-22 DIAGNOSIS — M6281 Muscle weakness (generalized): Secondary | ICD-10-CM | POA: Diagnosis not present

## 2016-10-22 DIAGNOSIS — I7389 Other specified peripheral vascular diseases: Secondary | ICD-10-CM | POA: Diagnosis not present

## 2016-10-22 DIAGNOSIS — I5032 Chronic diastolic (congestive) heart failure: Secondary | ICD-10-CM | POA: Diagnosis not present

## 2016-10-22 DIAGNOSIS — I10 Essential (primary) hypertension: Secondary | ICD-10-CM | POA: Diagnosis not present

## 2016-10-22 DIAGNOSIS — R278 Other lack of coordination: Secondary | ICD-10-CM | POA: Diagnosis not present

## 2016-10-23 DIAGNOSIS — M6281 Muscle weakness (generalized): Secondary | ICD-10-CM | POA: Diagnosis not present

## 2016-10-23 DIAGNOSIS — I7389 Other specified peripheral vascular diseases: Secondary | ICD-10-CM | POA: Diagnosis not present

## 2016-10-23 DIAGNOSIS — I5032 Chronic diastolic (congestive) heart failure: Secondary | ICD-10-CM | POA: Diagnosis not present

## 2016-10-23 DIAGNOSIS — R278 Other lack of coordination: Secondary | ICD-10-CM | POA: Diagnosis not present

## 2016-10-23 DIAGNOSIS — I10 Essential (primary) hypertension: Secondary | ICD-10-CM | POA: Diagnosis not present

## 2016-10-24 DIAGNOSIS — I5032 Chronic diastolic (congestive) heart failure: Secondary | ICD-10-CM | POA: Diagnosis not present

## 2016-10-24 DIAGNOSIS — I7389 Other specified peripheral vascular diseases: Secondary | ICD-10-CM | POA: Diagnosis not present

## 2016-10-24 DIAGNOSIS — M6281 Muscle weakness (generalized): Secondary | ICD-10-CM | POA: Diagnosis not present

## 2016-10-24 DIAGNOSIS — R278 Other lack of coordination: Secondary | ICD-10-CM | POA: Diagnosis not present

## 2016-10-24 DIAGNOSIS — E079 Disorder of thyroid, unspecified: Secondary | ICD-10-CM | POA: Diagnosis not present

## 2016-10-24 DIAGNOSIS — R2689 Other abnormalities of gait and mobility: Secondary | ICD-10-CM | POA: Diagnosis not present

## 2016-10-24 DIAGNOSIS — I10 Essential (primary) hypertension: Secondary | ICD-10-CM | POA: Diagnosis not present

## 2016-10-25 DIAGNOSIS — I10 Essential (primary) hypertension: Secondary | ICD-10-CM | POA: Diagnosis not present

## 2016-10-25 DIAGNOSIS — I5032 Chronic diastolic (congestive) heart failure: Secondary | ICD-10-CM | POA: Diagnosis not present

## 2016-10-25 DIAGNOSIS — R278 Other lack of coordination: Secondary | ICD-10-CM | POA: Diagnosis not present

## 2016-10-25 DIAGNOSIS — I7389 Other specified peripheral vascular diseases: Secondary | ICD-10-CM | POA: Diagnosis not present

## 2016-10-25 DIAGNOSIS — M6281 Muscle weakness (generalized): Secondary | ICD-10-CM | POA: Diagnosis not present

## 2016-10-28 DIAGNOSIS — I5032 Chronic diastolic (congestive) heart failure: Secondary | ICD-10-CM | POA: Diagnosis not present

## 2016-10-28 DIAGNOSIS — I10 Essential (primary) hypertension: Secondary | ICD-10-CM | POA: Diagnosis not present

## 2016-10-28 DIAGNOSIS — M6281 Muscle weakness (generalized): Secondary | ICD-10-CM | POA: Diagnosis not present

## 2016-10-28 DIAGNOSIS — I7389 Other specified peripheral vascular diseases: Secondary | ICD-10-CM | POA: Diagnosis not present

## 2016-10-28 DIAGNOSIS — R278 Other lack of coordination: Secondary | ICD-10-CM | POA: Diagnosis not present

## 2016-10-29 DIAGNOSIS — E559 Vitamin D deficiency, unspecified: Secondary | ICD-10-CM | POA: Diagnosis not present

## 2016-10-29 DIAGNOSIS — I10 Essential (primary) hypertension: Secondary | ICD-10-CM | POA: Diagnosis not present

## 2016-10-29 DIAGNOSIS — Z79899 Other long term (current) drug therapy: Secondary | ICD-10-CM | POA: Diagnosis not present

## 2016-10-29 DIAGNOSIS — M6281 Muscle weakness (generalized): Secondary | ICD-10-CM | POA: Diagnosis not present

## 2016-10-29 DIAGNOSIS — I5032 Chronic diastolic (congestive) heart failure: Secondary | ICD-10-CM | POA: Diagnosis not present

## 2016-10-29 DIAGNOSIS — E119 Type 2 diabetes mellitus without complications: Secondary | ICD-10-CM | POA: Diagnosis not present

## 2016-10-29 DIAGNOSIS — D518 Other vitamin B12 deficiency anemias: Secondary | ICD-10-CM | POA: Diagnosis not present

## 2016-10-29 DIAGNOSIS — R278 Other lack of coordination: Secondary | ICD-10-CM | POA: Diagnosis not present

## 2016-10-29 DIAGNOSIS — E038 Other specified hypothyroidism: Secondary | ICD-10-CM | POA: Diagnosis not present

## 2016-10-29 DIAGNOSIS — I7389 Other specified peripheral vascular diseases: Secondary | ICD-10-CM | POA: Diagnosis not present

## 2016-10-30 DIAGNOSIS — I5032 Chronic diastolic (congestive) heart failure: Secondary | ICD-10-CM | POA: Diagnosis not present

## 2016-10-30 DIAGNOSIS — M6281 Muscle weakness (generalized): Secondary | ICD-10-CM | POA: Diagnosis not present

## 2016-10-30 DIAGNOSIS — R278 Other lack of coordination: Secondary | ICD-10-CM | POA: Diagnosis not present

## 2016-10-30 DIAGNOSIS — I7389 Other specified peripheral vascular diseases: Secondary | ICD-10-CM | POA: Diagnosis not present

## 2016-10-30 DIAGNOSIS — I10 Essential (primary) hypertension: Secondary | ICD-10-CM | POA: Diagnosis not present

## 2016-10-31 DIAGNOSIS — I5032 Chronic diastolic (congestive) heart failure: Secondary | ICD-10-CM | POA: Diagnosis not present

## 2016-10-31 DIAGNOSIS — I10 Essential (primary) hypertension: Secondary | ICD-10-CM | POA: Diagnosis not present

## 2016-10-31 DIAGNOSIS — M6281 Muscle weakness (generalized): Secondary | ICD-10-CM | POA: Diagnosis not present

## 2016-10-31 DIAGNOSIS — I7389 Other specified peripheral vascular diseases: Secondary | ICD-10-CM | POA: Diagnosis not present

## 2016-10-31 DIAGNOSIS — R278 Other lack of coordination: Secondary | ICD-10-CM | POA: Diagnosis not present

## 2016-11-01 DIAGNOSIS — I10 Essential (primary) hypertension: Secondary | ICD-10-CM | POA: Diagnosis not present

## 2016-11-01 DIAGNOSIS — M19041 Primary osteoarthritis, right hand: Secondary | ICD-10-CM | POA: Diagnosis not present

## 2016-11-01 DIAGNOSIS — R278 Other lack of coordination: Secondary | ICD-10-CM | POA: Diagnosis not present

## 2016-11-01 DIAGNOSIS — I7389 Other specified peripheral vascular diseases: Secondary | ICD-10-CM | POA: Diagnosis not present

## 2016-11-01 DIAGNOSIS — H6983 Other specified disorders of Eustachian tube, bilateral: Secondary | ICD-10-CM | POA: Diagnosis not present

## 2016-11-01 DIAGNOSIS — M19042 Primary osteoarthritis, left hand: Secondary | ICD-10-CM | POA: Diagnosis not present

## 2016-11-01 DIAGNOSIS — I5032 Chronic diastolic (congestive) heart failure: Secondary | ICD-10-CM | POA: Diagnosis not present

## 2016-11-01 DIAGNOSIS — M6281 Muscle weakness (generalized): Secondary | ICD-10-CM | POA: Diagnosis not present

## 2016-11-04 DIAGNOSIS — I7389 Other specified peripheral vascular diseases: Secondary | ICD-10-CM | POA: Diagnosis not present

## 2016-11-04 DIAGNOSIS — I10 Essential (primary) hypertension: Secondary | ICD-10-CM | POA: Diagnosis not present

## 2016-11-04 DIAGNOSIS — M6281 Muscle weakness (generalized): Secondary | ICD-10-CM | POA: Diagnosis not present

## 2016-11-04 DIAGNOSIS — R278 Other lack of coordination: Secondary | ICD-10-CM | POA: Diagnosis not present

## 2016-11-04 DIAGNOSIS — I5032 Chronic diastolic (congestive) heart failure: Secondary | ICD-10-CM | POA: Diagnosis not present

## 2016-11-05 DIAGNOSIS — Z1389 Encounter for screening for other disorder: Secondary | ICD-10-CM | POA: Diagnosis not present

## 2016-11-05 DIAGNOSIS — M79675 Pain in left toe(s): Secondary | ICD-10-CM | POA: Diagnosis not present

## 2016-11-05 DIAGNOSIS — I7389 Other specified peripheral vascular diseases: Secondary | ICD-10-CM | POA: Diagnosis not present

## 2016-11-05 DIAGNOSIS — L84 Corns and callosities: Secondary | ICD-10-CM | POA: Diagnosis not present

## 2016-11-05 DIAGNOSIS — I10 Essential (primary) hypertension: Secondary | ICD-10-CM | POA: Diagnosis not present

## 2016-11-05 DIAGNOSIS — R278 Other lack of coordination: Secondary | ICD-10-CM | POA: Diagnosis not present

## 2016-11-05 DIAGNOSIS — I5032 Chronic diastolic (congestive) heart failure: Secondary | ICD-10-CM | POA: Diagnosis not present

## 2016-11-05 DIAGNOSIS — M6281 Muscle weakness (generalized): Secondary | ICD-10-CM | POA: Diagnosis not present

## 2016-11-05 DIAGNOSIS — B351 Tinea unguium: Secondary | ICD-10-CM | POA: Diagnosis not present

## 2016-11-05 DIAGNOSIS — Z79899 Other long term (current) drug therapy: Secondary | ICD-10-CM | POA: Diagnosis not present

## 2016-11-06 DIAGNOSIS — R278 Other lack of coordination: Secondary | ICD-10-CM | POA: Diagnosis not present

## 2016-11-06 DIAGNOSIS — M6281 Muscle weakness (generalized): Secondary | ICD-10-CM | POA: Diagnosis not present

## 2016-11-06 DIAGNOSIS — I10 Essential (primary) hypertension: Secondary | ICD-10-CM | POA: Diagnosis not present

## 2016-11-06 DIAGNOSIS — I5032 Chronic diastolic (congestive) heart failure: Secondary | ICD-10-CM | POA: Diagnosis not present

## 2016-11-06 DIAGNOSIS — I7389 Other specified peripheral vascular diseases: Secondary | ICD-10-CM | POA: Diagnosis not present

## 2016-11-07 DIAGNOSIS — R278 Other lack of coordination: Secondary | ICD-10-CM | POA: Diagnosis not present

## 2016-11-07 DIAGNOSIS — I10 Essential (primary) hypertension: Secondary | ICD-10-CM | POA: Diagnosis not present

## 2016-11-07 DIAGNOSIS — M6281 Muscle weakness (generalized): Secondary | ICD-10-CM | POA: Diagnosis not present

## 2016-11-07 DIAGNOSIS — I7389 Other specified peripheral vascular diseases: Secondary | ICD-10-CM | POA: Diagnosis not present

## 2016-11-07 DIAGNOSIS — I5032 Chronic diastolic (congestive) heart failure: Secondary | ICD-10-CM | POA: Diagnosis not present

## 2016-11-08 DIAGNOSIS — I10 Essential (primary) hypertension: Secondary | ICD-10-CM | POA: Diagnosis not present

## 2016-11-08 DIAGNOSIS — I7389 Other specified peripheral vascular diseases: Secondary | ICD-10-CM | POA: Diagnosis not present

## 2016-11-08 DIAGNOSIS — I5032 Chronic diastolic (congestive) heart failure: Secondary | ICD-10-CM | POA: Diagnosis not present

## 2016-11-08 DIAGNOSIS — R278 Other lack of coordination: Secondary | ICD-10-CM | POA: Diagnosis not present

## 2016-11-08 DIAGNOSIS — M6281 Muscle weakness (generalized): Secondary | ICD-10-CM | POA: Diagnosis not present

## 2016-11-11 DIAGNOSIS — I5032 Chronic diastolic (congestive) heart failure: Secondary | ICD-10-CM | POA: Diagnosis not present

## 2016-11-11 DIAGNOSIS — I7389 Other specified peripheral vascular diseases: Secondary | ICD-10-CM | POA: Diagnosis not present

## 2016-11-11 DIAGNOSIS — R278 Other lack of coordination: Secondary | ICD-10-CM | POA: Diagnosis not present

## 2016-11-11 DIAGNOSIS — M6281 Muscle weakness (generalized): Secondary | ICD-10-CM | POA: Diagnosis not present

## 2016-11-11 DIAGNOSIS — I10 Essential (primary) hypertension: Secondary | ICD-10-CM | POA: Diagnosis not present

## 2016-11-12 DIAGNOSIS — R278 Other lack of coordination: Secondary | ICD-10-CM | POA: Diagnosis not present

## 2016-11-12 DIAGNOSIS — I7389 Other specified peripheral vascular diseases: Secondary | ICD-10-CM | POA: Diagnosis not present

## 2016-11-12 DIAGNOSIS — M6281 Muscle weakness (generalized): Secondary | ICD-10-CM | POA: Diagnosis not present

## 2016-11-12 DIAGNOSIS — I10 Essential (primary) hypertension: Secondary | ICD-10-CM | POA: Diagnosis not present

## 2016-11-12 DIAGNOSIS — I5032 Chronic diastolic (congestive) heart failure: Secondary | ICD-10-CM | POA: Diagnosis not present

## 2016-11-13 DIAGNOSIS — I10 Essential (primary) hypertension: Secondary | ICD-10-CM | POA: Diagnosis not present

## 2016-11-13 DIAGNOSIS — I5032 Chronic diastolic (congestive) heart failure: Secondary | ICD-10-CM | POA: Diagnosis not present

## 2016-11-13 DIAGNOSIS — M6281 Muscle weakness (generalized): Secondary | ICD-10-CM | POA: Diagnosis not present

## 2016-11-13 DIAGNOSIS — I7389 Other specified peripheral vascular diseases: Secondary | ICD-10-CM | POA: Diagnosis not present

## 2016-11-13 DIAGNOSIS — R278 Other lack of coordination: Secondary | ICD-10-CM | POA: Diagnosis not present

## 2016-11-14 DIAGNOSIS — R278 Other lack of coordination: Secondary | ICD-10-CM | POA: Diagnosis not present

## 2016-11-14 DIAGNOSIS — I7389 Other specified peripheral vascular diseases: Secondary | ICD-10-CM | POA: Diagnosis not present

## 2016-11-14 DIAGNOSIS — M6281 Muscle weakness (generalized): Secondary | ICD-10-CM | POA: Diagnosis not present

## 2016-11-14 DIAGNOSIS — I5032 Chronic diastolic (congestive) heart failure: Secondary | ICD-10-CM | POA: Diagnosis not present

## 2016-11-14 DIAGNOSIS — I10 Essential (primary) hypertension: Secondary | ICD-10-CM | POA: Diagnosis not present

## 2016-11-15 DIAGNOSIS — M6281 Muscle weakness (generalized): Secondary | ICD-10-CM | POA: Diagnosis not present

## 2016-11-15 DIAGNOSIS — R278 Other lack of coordination: Secondary | ICD-10-CM | POA: Diagnosis not present

## 2016-11-15 DIAGNOSIS — I10 Essential (primary) hypertension: Secondary | ICD-10-CM | POA: Diagnosis not present

## 2016-11-15 DIAGNOSIS — I7389 Other specified peripheral vascular diseases: Secondary | ICD-10-CM | POA: Diagnosis not present

## 2016-11-15 DIAGNOSIS — I5032 Chronic diastolic (congestive) heart failure: Secondary | ICD-10-CM | POA: Diagnosis not present

## 2016-11-18 DIAGNOSIS — M6281 Muscle weakness (generalized): Secondary | ICD-10-CM | POA: Diagnosis not present

## 2016-11-18 DIAGNOSIS — R278 Other lack of coordination: Secondary | ICD-10-CM | POA: Diagnosis not present

## 2016-11-18 DIAGNOSIS — I5032 Chronic diastolic (congestive) heart failure: Secondary | ICD-10-CM | POA: Diagnosis not present

## 2016-11-18 DIAGNOSIS — I7389 Other specified peripheral vascular diseases: Secondary | ICD-10-CM | POA: Diagnosis not present

## 2016-11-18 DIAGNOSIS — I10 Essential (primary) hypertension: Secondary | ICD-10-CM | POA: Diagnosis not present

## 2016-11-19 DIAGNOSIS — R278 Other lack of coordination: Secondary | ICD-10-CM | POA: Diagnosis not present

## 2016-11-19 DIAGNOSIS — M6281 Muscle weakness (generalized): Secondary | ICD-10-CM | POA: Diagnosis not present

## 2016-11-19 DIAGNOSIS — I7389 Other specified peripheral vascular diseases: Secondary | ICD-10-CM | POA: Diagnosis not present

## 2016-11-19 DIAGNOSIS — I10 Essential (primary) hypertension: Secondary | ICD-10-CM | POA: Diagnosis not present

## 2016-11-19 DIAGNOSIS — I5032 Chronic diastolic (congestive) heart failure: Secondary | ICD-10-CM | POA: Diagnosis not present

## 2016-11-20 DIAGNOSIS — I7389 Other specified peripheral vascular diseases: Secondary | ICD-10-CM | POA: Diagnosis not present

## 2016-11-20 DIAGNOSIS — M6281 Muscle weakness (generalized): Secondary | ICD-10-CM | POA: Diagnosis not present

## 2016-11-20 DIAGNOSIS — I10 Essential (primary) hypertension: Secondary | ICD-10-CM | POA: Diagnosis not present

## 2016-11-20 DIAGNOSIS — I5032 Chronic diastolic (congestive) heart failure: Secondary | ICD-10-CM | POA: Diagnosis not present

## 2016-11-20 DIAGNOSIS — R278 Other lack of coordination: Secondary | ICD-10-CM | POA: Diagnosis not present

## 2016-11-21 DIAGNOSIS — I10 Essential (primary) hypertension: Secondary | ICD-10-CM | POA: Diagnosis not present

## 2016-11-21 DIAGNOSIS — I7389 Other specified peripheral vascular diseases: Secondary | ICD-10-CM | POA: Diagnosis not present

## 2016-11-21 DIAGNOSIS — M6281 Muscle weakness (generalized): Secondary | ICD-10-CM | POA: Diagnosis not present

## 2016-11-21 DIAGNOSIS — I5032 Chronic diastolic (congestive) heart failure: Secondary | ICD-10-CM | POA: Diagnosis not present

## 2016-11-21 DIAGNOSIS — R278 Other lack of coordination: Secondary | ICD-10-CM | POA: Diagnosis not present

## 2016-11-22 DIAGNOSIS — I5032 Chronic diastolic (congestive) heart failure: Secondary | ICD-10-CM | POA: Diagnosis not present

## 2016-11-22 DIAGNOSIS — R278 Other lack of coordination: Secondary | ICD-10-CM | POA: Diagnosis not present

## 2016-11-22 DIAGNOSIS — I10 Essential (primary) hypertension: Secondary | ICD-10-CM | POA: Diagnosis not present

## 2016-11-22 DIAGNOSIS — I7389 Other specified peripheral vascular diseases: Secondary | ICD-10-CM | POA: Diagnosis not present

## 2016-11-22 DIAGNOSIS — M6281 Muscle weakness (generalized): Secondary | ICD-10-CM | POA: Diagnosis not present

## 2016-11-25 DIAGNOSIS — I7389 Other specified peripheral vascular diseases: Secondary | ICD-10-CM | POA: Diagnosis not present

## 2016-11-25 DIAGNOSIS — I10 Essential (primary) hypertension: Secondary | ICD-10-CM | POA: Diagnosis not present

## 2016-11-25 DIAGNOSIS — R278 Other lack of coordination: Secondary | ICD-10-CM | POA: Diagnosis not present

## 2016-11-25 DIAGNOSIS — M6281 Muscle weakness (generalized): Secondary | ICD-10-CM | POA: Diagnosis not present

## 2016-11-25 DIAGNOSIS — I5032 Chronic diastolic (congestive) heart failure: Secondary | ICD-10-CM | POA: Diagnosis not present

## 2016-11-26 DIAGNOSIS — R278 Other lack of coordination: Secondary | ICD-10-CM | POA: Diagnosis not present

## 2016-11-26 DIAGNOSIS — I10 Essential (primary) hypertension: Secondary | ICD-10-CM | POA: Diagnosis not present

## 2016-11-26 DIAGNOSIS — I5032 Chronic diastolic (congestive) heart failure: Secondary | ICD-10-CM | POA: Diagnosis not present

## 2016-11-26 DIAGNOSIS — I7389 Other specified peripheral vascular diseases: Secondary | ICD-10-CM | POA: Diagnosis not present

## 2016-11-26 DIAGNOSIS — M6281 Muscle weakness (generalized): Secondary | ICD-10-CM | POA: Diagnosis not present

## 2016-11-27 DIAGNOSIS — I10 Essential (primary) hypertension: Secondary | ICD-10-CM | POA: Diagnosis not present

## 2016-11-27 DIAGNOSIS — I5032 Chronic diastolic (congestive) heart failure: Secondary | ICD-10-CM | POA: Diagnosis not present

## 2016-11-27 DIAGNOSIS — R278 Other lack of coordination: Secondary | ICD-10-CM | POA: Diagnosis not present

## 2016-11-27 DIAGNOSIS — I7389 Other specified peripheral vascular diseases: Secondary | ICD-10-CM | POA: Diagnosis not present

## 2016-11-27 DIAGNOSIS — M6281 Muscle weakness (generalized): Secondary | ICD-10-CM | POA: Diagnosis not present

## 2016-11-28 DIAGNOSIS — R2689 Other abnormalities of gait and mobility: Secondary | ICD-10-CM | POA: Diagnosis not present

## 2016-11-28 DIAGNOSIS — I5032 Chronic diastolic (congestive) heart failure: Secondary | ICD-10-CM | POA: Diagnosis not present

## 2016-11-28 DIAGNOSIS — I10 Essential (primary) hypertension: Secondary | ICD-10-CM | POA: Diagnosis not present

## 2016-11-28 DIAGNOSIS — E079 Disorder of thyroid, unspecified: Secondary | ICD-10-CM | POA: Diagnosis not present

## 2016-11-30 DIAGNOSIS — I5032 Chronic diastolic (congestive) heart failure: Secondary | ICD-10-CM | POA: Diagnosis not present

## 2016-11-30 DIAGNOSIS — R278 Other lack of coordination: Secondary | ICD-10-CM | POA: Diagnosis not present

## 2016-11-30 DIAGNOSIS — I7389 Other specified peripheral vascular diseases: Secondary | ICD-10-CM | POA: Diagnosis not present

## 2016-11-30 DIAGNOSIS — M6281 Muscle weakness (generalized): Secondary | ICD-10-CM | POA: Diagnosis not present

## 2016-11-30 DIAGNOSIS — I10 Essential (primary) hypertension: Secondary | ICD-10-CM | POA: Diagnosis not present

## 2016-12-01 DIAGNOSIS — I7389 Other specified peripheral vascular diseases: Secondary | ICD-10-CM | POA: Diagnosis not present

## 2016-12-01 DIAGNOSIS — I10 Essential (primary) hypertension: Secondary | ICD-10-CM | POA: Diagnosis not present

## 2016-12-01 DIAGNOSIS — I5032 Chronic diastolic (congestive) heart failure: Secondary | ICD-10-CM | POA: Diagnosis not present

## 2016-12-01 DIAGNOSIS — M6281 Muscle weakness (generalized): Secondary | ICD-10-CM | POA: Diagnosis not present

## 2016-12-01 DIAGNOSIS — R278 Other lack of coordination: Secondary | ICD-10-CM | POA: Diagnosis not present

## 2016-12-02 DIAGNOSIS — I5032 Chronic diastolic (congestive) heart failure: Secondary | ICD-10-CM | POA: Diagnosis not present

## 2016-12-02 DIAGNOSIS — I10 Essential (primary) hypertension: Secondary | ICD-10-CM | POA: Diagnosis not present

## 2016-12-02 DIAGNOSIS — I7389 Other specified peripheral vascular diseases: Secondary | ICD-10-CM | POA: Diagnosis not present

## 2016-12-02 DIAGNOSIS — R278 Other lack of coordination: Secondary | ICD-10-CM | POA: Diagnosis not present

## 2016-12-02 DIAGNOSIS — M6281 Muscle weakness (generalized): Secondary | ICD-10-CM | POA: Diagnosis not present

## 2016-12-03 DIAGNOSIS — M6281 Muscle weakness (generalized): Secondary | ICD-10-CM | POA: Diagnosis not present

## 2016-12-03 DIAGNOSIS — I10 Essential (primary) hypertension: Secondary | ICD-10-CM | POA: Diagnosis not present

## 2016-12-03 DIAGNOSIS — I5032 Chronic diastolic (congestive) heart failure: Secondary | ICD-10-CM | POA: Diagnosis not present

## 2016-12-03 DIAGNOSIS — R278 Other lack of coordination: Secondary | ICD-10-CM | POA: Diagnosis not present

## 2016-12-03 DIAGNOSIS — I7389 Other specified peripheral vascular diseases: Secondary | ICD-10-CM | POA: Diagnosis not present

## 2016-12-04 DIAGNOSIS — I5032 Chronic diastolic (congestive) heart failure: Secondary | ICD-10-CM | POA: Diagnosis not present

## 2016-12-04 DIAGNOSIS — I7389 Other specified peripheral vascular diseases: Secondary | ICD-10-CM | POA: Diagnosis not present

## 2016-12-04 DIAGNOSIS — R278 Other lack of coordination: Secondary | ICD-10-CM | POA: Diagnosis not present

## 2016-12-04 DIAGNOSIS — M6281 Muscle weakness (generalized): Secondary | ICD-10-CM | POA: Diagnosis not present

## 2016-12-04 DIAGNOSIS — I10 Essential (primary) hypertension: Secondary | ICD-10-CM | POA: Diagnosis not present

## 2016-12-06 DIAGNOSIS — I10 Essential (primary) hypertension: Secondary | ICD-10-CM | POA: Diagnosis not present

## 2016-12-06 DIAGNOSIS — I7389 Other specified peripheral vascular diseases: Secondary | ICD-10-CM | POA: Diagnosis not present

## 2016-12-06 DIAGNOSIS — R278 Other lack of coordination: Secondary | ICD-10-CM | POA: Diagnosis not present

## 2016-12-06 DIAGNOSIS — M6281 Muscle weakness (generalized): Secondary | ICD-10-CM | POA: Diagnosis not present

## 2016-12-06 DIAGNOSIS — I5032 Chronic diastolic (congestive) heart failure: Secondary | ICD-10-CM | POA: Diagnosis not present

## 2016-12-09 DIAGNOSIS — M6281 Muscle weakness (generalized): Secondary | ICD-10-CM | POA: Diagnosis not present

## 2016-12-09 DIAGNOSIS — R278 Other lack of coordination: Secondary | ICD-10-CM | POA: Diagnosis not present

## 2016-12-09 DIAGNOSIS — I10 Essential (primary) hypertension: Secondary | ICD-10-CM | POA: Diagnosis not present

## 2016-12-09 DIAGNOSIS — I5032 Chronic diastolic (congestive) heart failure: Secondary | ICD-10-CM | POA: Diagnosis not present

## 2016-12-09 DIAGNOSIS — I7389 Other specified peripheral vascular diseases: Secondary | ICD-10-CM | POA: Diagnosis not present

## 2016-12-10 DIAGNOSIS — I10 Essential (primary) hypertension: Secondary | ICD-10-CM | POA: Diagnosis not present

## 2016-12-10 DIAGNOSIS — R278 Other lack of coordination: Secondary | ICD-10-CM | POA: Diagnosis not present

## 2016-12-10 DIAGNOSIS — I7389 Other specified peripheral vascular diseases: Secondary | ICD-10-CM | POA: Diagnosis not present

## 2016-12-10 DIAGNOSIS — M6281 Muscle weakness (generalized): Secondary | ICD-10-CM | POA: Diagnosis not present

## 2016-12-10 DIAGNOSIS — I5032 Chronic diastolic (congestive) heart failure: Secondary | ICD-10-CM | POA: Diagnosis not present

## 2016-12-11 DIAGNOSIS — M6281 Muscle weakness (generalized): Secondary | ICD-10-CM | POA: Diagnosis not present

## 2016-12-11 DIAGNOSIS — I10 Essential (primary) hypertension: Secondary | ICD-10-CM | POA: Diagnosis not present

## 2016-12-11 DIAGNOSIS — I5032 Chronic diastolic (congestive) heart failure: Secondary | ICD-10-CM | POA: Diagnosis not present

## 2016-12-11 DIAGNOSIS — I7389 Other specified peripheral vascular diseases: Secondary | ICD-10-CM | POA: Diagnosis not present

## 2016-12-11 DIAGNOSIS — R278 Other lack of coordination: Secondary | ICD-10-CM | POA: Diagnosis not present

## 2016-12-12 DIAGNOSIS — R278 Other lack of coordination: Secondary | ICD-10-CM | POA: Diagnosis not present

## 2016-12-12 DIAGNOSIS — M6281 Muscle weakness (generalized): Secondary | ICD-10-CM | POA: Diagnosis not present

## 2016-12-12 DIAGNOSIS — I10 Essential (primary) hypertension: Secondary | ICD-10-CM | POA: Diagnosis not present

## 2016-12-12 DIAGNOSIS — I7389 Other specified peripheral vascular diseases: Secondary | ICD-10-CM | POA: Diagnosis not present

## 2016-12-12 DIAGNOSIS — M1711 Unilateral primary osteoarthritis, right knee: Secondary | ICD-10-CM | POA: Diagnosis not present

## 2016-12-12 DIAGNOSIS — I5032 Chronic diastolic (congestive) heart failure: Secondary | ICD-10-CM | POA: Diagnosis not present

## 2016-12-12 DIAGNOSIS — M1712 Unilateral primary osteoarthritis, left knee: Secondary | ICD-10-CM | POA: Diagnosis not present

## 2016-12-13 DIAGNOSIS — I7389 Other specified peripheral vascular diseases: Secondary | ICD-10-CM | POA: Diagnosis not present

## 2016-12-13 DIAGNOSIS — I5032 Chronic diastolic (congestive) heart failure: Secondary | ICD-10-CM | POA: Diagnosis not present

## 2016-12-13 DIAGNOSIS — M6281 Muscle weakness (generalized): Secondary | ICD-10-CM | POA: Diagnosis not present

## 2016-12-13 DIAGNOSIS — I10 Essential (primary) hypertension: Secondary | ICD-10-CM | POA: Diagnosis not present

## 2016-12-13 DIAGNOSIS — R278 Other lack of coordination: Secondary | ICD-10-CM | POA: Diagnosis not present

## 2016-12-16 DIAGNOSIS — I7389 Other specified peripheral vascular diseases: Secondary | ICD-10-CM | POA: Diagnosis not present

## 2016-12-16 DIAGNOSIS — H6983 Other specified disorders of Eustachian tube, bilateral: Secondary | ICD-10-CM | POA: Diagnosis not present

## 2016-12-16 DIAGNOSIS — R278 Other lack of coordination: Secondary | ICD-10-CM | POA: Diagnosis not present

## 2016-12-16 DIAGNOSIS — I5032 Chronic diastolic (congestive) heart failure: Secondary | ICD-10-CM | POA: Diagnosis not present

## 2016-12-16 DIAGNOSIS — M19041 Primary osteoarthritis, right hand: Secondary | ICD-10-CM | POA: Diagnosis not present

## 2016-12-16 DIAGNOSIS — M6281 Muscle weakness (generalized): Secondary | ICD-10-CM | POA: Diagnosis not present

## 2016-12-16 DIAGNOSIS — I10 Essential (primary) hypertension: Secondary | ICD-10-CM | POA: Diagnosis not present

## 2016-12-16 DIAGNOSIS — M170 Bilateral primary osteoarthritis of knee: Secondary | ICD-10-CM | POA: Diagnosis not present

## 2016-12-17 DIAGNOSIS — I5032 Chronic diastolic (congestive) heart failure: Secondary | ICD-10-CM | POA: Diagnosis not present

## 2016-12-17 DIAGNOSIS — R278 Other lack of coordination: Secondary | ICD-10-CM | POA: Diagnosis not present

## 2016-12-17 DIAGNOSIS — I7389 Other specified peripheral vascular diseases: Secondary | ICD-10-CM | POA: Diagnosis not present

## 2016-12-17 DIAGNOSIS — I10 Essential (primary) hypertension: Secondary | ICD-10-CM | POA: Diagnosis not present

## 2016-12-17 DIAGNOSIS — M6281 Muscle weakness (generalized): Secondary | ICD-10-CM | POA: Diagnosis not present

## 2016-12-18 DIAGNOSIS — M6281 Muscle weakness (generalized): Secondary | ICD-10-CM | POA: Diagnosis not present

## 2016-12-18 DIAGNOSIS — I5032 Chronic diastolic (congestive) heart failure: Secondary | ICD-10-CM | POA: Diagnosis not present

## 2016-12-18 DIAGNOSIS — I7389 Other specified peripheral vascular diseases: Secondary | ICD-10-CM | POA: Diagnosis not present

## 2016-12-18 DIAGNOSIS — R278 Other lack of coordination: Secondary | ICD-10-CM | POA: Diagnosis not present

## 2016-12-18 DIAGNOSIS — I10 Essential (primary) hypertension: Secondary | ICD-10-CM | POA: Diagnosis not present

## 2016-12-20 DIAGNOSIS — F339 Major depressive disorder, recurrent, unspecified: Secondary | ICD-10-CM | POA: Diagnosis not present

## 2016-12-20 DIAGNOSIS — F419 Anxiety disorder, unspecified: Secondary | ICD-10-CM | POA: Diagnosis not present

## 2016-12-24 DIAGNOSIS — Z79899 Other long term (current) drug therapy: Secondary | ICD-10-CM | POA: Diagnosis not present

## 2016-12-24 DIAGNOSIS — E782 Mixed hyperlipidemia: Secondary | ICD-10-CM | POA: Diagnosis not present

## 2016-12-24 DIAGNOSIS — E119 Type 2 diabetes mellitus without complications: Secondary | ICD-10-CM | POA: Diagnosis not present

## 2016-12-24 DIAGNOSIS — D518 Other vitamin B12 deficiency anemias: Secondary | ICD-10-CM | POA: Diagnosis not present

## 2017-01-06 DIAGNOSIS — Z79899 Other long term (current) drug therapy: Secondary | ICD-10-CM | POA: Diagnosis not present

## 2017-01-06 DIAGNOSIS — D518 Other vitamin B12 deficiency anemias: Secondary | ICD-10-CM | POA: Diagnosis not present

## 2017-01-06 DIAGNOSIS — E119 Type 2 diabetes mellitus without complications: Secondary | ICD-10-CM | POA: Diagnosis not present

## 2017-01-06 DIAGNOSIS — E782 Mixed hyperlipidemia: Secondary | ICD-10-CM | POA: Diagnosis not present

## 2017-01-08 DIAGNOSIS — R7309 Other abnormal glucose: Secondary | ICD-10-CM | POA: Diagnosis not present

## 2017-01-08 DIAGNOSIS — Z79899 Other long term (current) drug therapy: Secondary | ICD-10-CM | POA: Diagnosis not present

## 2017-01-08 DIAGNOSIS — E119 Type 2 diabetes mellitus without complications: Secondary | ICD-10-CM | POA: Diagnosis not present

## 2017-01-16 DIAGNOSIS — K219 Gastro-esophageal reflux disease without esophagitis: Secondary | ICD-10-CM | POA: Diagnosis not present

## 2017-01-16 DIAGNOSIS — I10 Essential (primary) hypertension: Secondary | ICD-10-CM | POA: Diagnosis not present

## 2017-01-16 DIAGNOSIS — M170 Bilateral primary osteoarthritis of knee: Secondary | ICD-10-CM | POA: Diagnosis not present

## 2017-01-16 DIAGNOSIS — I5032 Chronic diastolic (congestive) heart failure: Secondary | ICD-10-CM | POA: Diagnosis not present

## 2017-01-28 DIAGNOSIS — Z23 Encounter for immunization: Secondary | ICD-10-CM | POA: Diagnosis not present

## 2017-02-11 DIAGNOSIS — B351 Tinea unguium: Secondary | ICD-10-CM | POA: Diagnosis not present

## 2017-02-11 DIAGNOSIS — I739 Peripheral vascular disease, unspecified: Secondary | ICD-10-CM | POA: Diagnosis not present

## 2017-02-11 DIAGNOSIS — L84 Corns and callosities: Secondary | ICD-10-CM | POA: Diagnosis not present

## 2017-02-11 DIAGNOSIS — M79675 Pain in left toe(s): Secondary | ICD-10-CM | POA: Diagnosis not present

## 2017-02-13 DIAGNOSIS — E079 Disorder of thyroid, unspecified: Secondary | ICD-10-CM | POA: Diagnosis not present

## 2017-02-13 DIAGNOSIS — M170 Bilateral primary osteoarthritis of knee: Secondary | ICD-10-CM | POA: Diagnosis not present

## 2017-02-13 DIAGNOSIS — K219 Gastro-esophageal reflux disease without esophagitis: Secondary | ICD-10-CM | POA: Diagnosis not present

## 2017-02-13 DIAGNOSIS — I5032 Chronic diastolic (congestive) heart failure: Secondary | ICD-10-CM | POA: Diagnosis not present

## 2017-03-03 DIAGNOSIS — H6123 Impacted cerumen, bilateral: Secondary | ICD-10-CM | POA: Diagnosis not present

## 2017-03-10 DIAGNOSIS — F419 Anxiety disorder, unspecified: Secondary | ICD-10-CM | POA: Diagnosis not present

## 2017-03-10 DIAGNOSIS — F339 Major depressive disorder, recurrent, unspecified: Secondary | ICD-10-CM | POA: Diagnosis not present

## 2017-03-14 DIAGNOSIS — K219 Gastro-esophageal reflux disease without esophagitis: Secondary | ICD-10-CM | POA: Diagnosis not present

## 2017-03-14 DIAGNOSIS — I10 Essential (primary) hypertension: Secondary | ICD-10-CM | POA: Diagnosis not present

## 2017-03-14 DIAGNOSIS — I5032 Chronic diastolic (congestive) heart failure: Secondary | ICD-10-CM | POA: Diagnosis not present

## 2017-03-14 DIAGNOSIS — M170 Bilateral primary osteoarthritis of knee: Secondary | ICD-10-CM | POA: Diagnosis not present

## 2017-04-07 DIAGNOSIS — H6121 Impacted cerumen, right ear: Secondary | ICD-10-CM | POA: Diagnosis not present

## 2017-04-07 DIAGNOSIS — M170 Bilateral primary osteoarthritis of knee: Secondary | ICD-10-CM | POA: Diagnosis not present

## 2017-04-07 DIAGNOSIS — K219 Gastro-esophageal reflux disease without esophagitis: Secondary | ICD-10-CM | POA: Diagnosis not present

## 2017-04-07 DIAGNOSIS — I10 Essential (primary) hypertension: Secondary | ICD-10-CM | POA: Diagnosis not present

## 2017-05-05 DIAGNOSIS — I739 Peripheral vascular disease, unspecified: Secondary | ICD-10-CM | POA: Diagnosis not present

## 2017-05-05 DIAGNOSIS — K219 Gastro-esophageal reflux disease without esophagitis: Secondary | ICD-10-CM | POA: Diagnosis not present

## 2017-05-05 DIAGNOSIS — I10 Essential (primary) hypertension: Secondary | ICD-10-CM | POA: Diagnosis not present

## 2017-05-05 DIAGNOSIS — I5032 Chronic diastolic (congestive) heart failure: Secondary | ICD-10-CM | POA: Diagnosis not present

## 2017-05-30 DIAGNOSIS — I5032 Chronic diastolic (congestive) heart failure: Secondary | ICD-10-CM | POA: Diagnosis not present

## 2017-05-30 DIAGNOSIS — K219 Gastro-esophageal reflux disease without esophagitis: Secondary | ICD-10-CM | POA: Diagnosis not present

## 2017-05-30 DIAGNOSIS — E079 Disorder of thyroid, unspecified: Secondary | ICD-10-CM | POA: Diagnosis not present

## 2017-05-30 DIAGNOSIS — I10 Essential (primary) hypertension: Secondary | ICD-10-CM | POA: Diagnosis not present

## 2017-06-02 DIAGNOSIS — I7389 Other specified peripheral vascular diseases: Secondary | ICD-10-CM | POA: Diagnosis not present

## 2017-06-02 DIAGNOSIS — M6281 Muscle weakness (generalized): Secondary | ICD-10-CM | POA: Diagnosis not present

## 2017-06-02 DIAGNOSIS — R278 Other lack of coordination: Secondary | ICD-10-CM | POA: Diagnosis not present

## 2017-06-02 DIAGNOSIS — I10 Essential (primary) hypertension: Secondary | ICD-10-CM | POA: Diagnosis not present

## 2017-06-02 DIAGNOSIS — I5032 Chronic diastolic (congestive) heart failure: Secondary | ICD-10-CM | POA: Diagnosis not present

## 2017-06-03 DIAGNOSIS — I10 Essential (primary) hypertension: Secondary | ICD-10-CM | POA: Diagnosis not present

## 2017-06-03 DIAGNOSIS — R278 Other lack of coordination: Secondary | ICD-10-CM | POA: Diagnosis not present

## 2017-06-03 DIAGNOSIS — I7389 Other specified peripheral vascular diseases: Secondary | ICD-10-CM | POA: Diagnosis not present

## 2017-06-03 DIAGNOSIS — M6281 Muscle weakness (generalized): Secondary | ICD-10-CM | POA: Diagnosis not present

## 2017-06-03 DIAGNOSIS — I5032 Chronic diastolic (congestive) heart failure: Secondary | ICD-10-CM | POA: Diagnosis not present

## 2017-06-04 DIAGNOSIS — I10 Essential (primary) hypertension: Secondary | ICD-10-CM | POA: Diagnosis not present

## 2017-06-04 DIAGNOSIS — I5032 Chronic diastolic (congestive) heart failure: Secondary | ICD-10-CM | POA: Diagnosis not present

## 2017-06-04 DIAGNOSIS — I7389 Other specified peripheral vascular diseases: Secondary | ICD-10-CM | POA: Diagnosis not present

## 2017-06-04 DIAGNOSIS — M6281 Muscle weakness (generalized): Secondary | ICD-10-CM | POA: Diagnosis not present

## 2017-06-04 DIAGNOSIS — R278 Other lack of coordination: Secondary | ICD-10-CM | POA: Diagnosis not present

## 2017-06-05 DIAGNOSIS — I7389 Other specified peripheral vascular diseases: Secondary | ICD-10-CM | POA: Diagnosis not present

## 2017-06-05 DIAGNOSIS — M6281 Muscle weakness (generalized): Secondary | ICD-10-CM | POA: Diagnosis not present

## 2017-06-05 DIAGNOSIS — I10 Essential (primary) hypertension: Secondary | ICD-10-CM | POA: Diagnosis not present

## 2017-06-05 DIAGNOSIS — R278 Other lack of coordination: Secondary | ICD-10-CM | POA: Diagnosis not present

## 2017-06-05 DIAGNOSIS — I5032 Chronic diastolic (congestive) heart failure: Secondary | ICD-10-CM | POA: Diagnosis not present

## 2017-06-06 DIAGNOSIS — R278 Other lack of coordination: Secondary | ICD-10-CM | POA: Diagnosis not present

## 2017-06-06 DIAGNOSIS — I5032 Chronic diastolic (congestive) heart failure: Secondary | ICD-10-CM | POA: Diagnosis not present

## 2017-06-06 DIAGNOSIS — I10 Essential (primary) hypertension: Secondary | ICD-10-CM | POA: Diagnosis not present

## 2017-06-06 DIAGNOSIS — M6281 Muscle weakness (generalized): Secondary | ICD-10-CM | POA: Diagnosis not present

## 2017-06-06 DIAGNOSIS — I7389 Other specified peripheral vascular diseases: Secondary | ICD-10-CM | POA: Diagnosis not present

## 2017-06-09 DIAGNOSIS — I10 Essential (primary) hypertension: Secondary | ICD-10-CM | POA: Diagnosis not present

## 2017-06-09 DIAGNOSIS — R278 Other lack of coordination: Secondary | ICD-10-CM | POA: Diagnosis not present

## 2017-06-09 DIAGNOSIS — M6281 Muscle weakness (generalized): Secondary | ICD-10-CM | POA: Diagnosis not present

## 2017-06-09 DIAGNOSIS — I5032 Chronic diastolic (congestive) heart failure: Secondary | ICD-10-CM | POA: Diagnosis not present

## 2017-06-09 DIAGNOSIS — I7389 Other specified peripheral vascular diseases: Secondary | ICD-10-CM | POA: Diagnosis not present

## 2017-06-10 DIAGNOSIS — M79675 Pain in left toe(s): Secondary | ICD-10-CM | POA: Diagnosis not present

## 2017-06-10 DIAGNOSIS — I10 Essential (primary) hypertension: Secondary | ICD-10-CM | POA: Diagnosis not present

## 2017-06-10 DIAGNOSIS — I5032 Chronic diastolic (congestive) heart failure: Secondary | ICD-10-CM | POA: Diagnosis not present

## 2017-06-10 DIAGNOSIS — I7389 Other specified peripheral vascular diseases: Secondary | ICD-10-CM | POA: Diagnosis not present

## 2017-06-10 DIAGNOSIS — I739 Peripheral vascular disease, unspecified: Secondary | ICD-10-CM | POA: Diagnosis not present

## 2017-06-10 DIAGNOSIS — M6281 Muscle weakness (generalized): Secondary | ICD-10-CM | POA: Diagnosis not present

## 2017-06-10 DIAGNOSIS — L84 Corns and callosities: Secondary | ICD-10-CM | POA: Diagnosis not present

## 2017-06-10 DIAGNOSIS — B351 Tinea unguium: Secondary | ICD-10-CM | POA: Diagnosis not present

## 2017-06-10 DIAGNOSIS — R278 Other lack of coordination: Secondary | ICD-10-CM | POA: Diagnosis not present

## 2017-06-11 DIAGNOSIS — I5032 Chronic diastolic (congestive) heart failure: Secondary | ICD-10-CM | POA: Diagnosis not present

## 2017-06-11 DIAGNOSIS — I7389 Other specified peripheral vascular diseases: Secondary | ICD-10-CM | POA: Diagnosis not present

## 2017-06-11 DIAGNOSIS — I10 Essential (primary) hypertension: Secondary | ICD-10-CM | POA: Diagnosis not present

## 2017-06-11 DIAGNOSIS — R278 Other lack of coordination: Secondary | ICD-10-CM | POA: Diagnosis not present

## 2017-06-11 DIAGNOSIS — M6281 Muscle weakness (generalized): Secondary | ICD-10-CM | POA: Diagnosis not present

## 2017-06-12 DIAGNOSIS — M6281 Muscle weakness (generalized): Secondary | ICD-10-CM | POA: Diagnosis not present

## 2017-06-12 DIAGNOSIS — I5032 Chronic diastolic (congestive) heart failure: Secondary | ICD-10-CM | POA: Diagnosis not present

## 2017-06-12 DIAGNOSIS — I7389 Other specified peripheral vascular diseases: Secondary | ICD-10-CM | POA: Diagnosis not present

## 2017-06-12 DIAGNOSIS — R278 Other lack of coordination: Secondary | ICD-10-CM | POA: Diagnosis not present

## 2017-06-12 DIAGNOSIS — I10 Essential (primary) hypertension: Secondary | ICD-10-CM | POA: Diagnosis not present

## 2017-06-13 DIAGNOSIS — I7389 Other specified peripheral vascular diseases: Secondary | ICD-10-CM | POA: Diagnosis not present

## 2017-06-13 DIAGNOSIS — M6281 Muscle weakness (generalized): Secondary | ICD-10-CM | POA: Diagnosis not present

## 2017-06-13 DIAGNOSIS — R278 Other lack of coordination: Secondary | ICD-10-CM | POA: Diagnosis not present

## 2017-06-13 DIAGNOSIS — I10 Essential (primary) hypertension: Secondary | ICD-10-CM | POA: Diagnosis not present

## 2017-06-13 DIAGNOSIS — I5032 Chronic diastolic (congestive) heart failure: Secondary | ICD-10-CM | POA: Diagnosis not present

## 2017-06-16 DIAGNOSIS — I10 Essential (primary) hypertension: Secondary | ICD-10-CM | POA: Diagnosis not present

## 2017-06-16 DIAGNOSIS — M6281 Muscle weakness (generalized): Secondary | ICD-10-CM | POA: Diagnosis not present

## 2017-06-16 DIAGNOSIS — R278 Other lack of coordination: Secondary | ICD-10-CM | POA: Diagnosis not present

## 2017-06-16 DIAGNOSIS — I7389 Other specified peripheral vascular diseases: Secondary | ICD-10-CM | POA: Diagnosis not present

## 2017-06-16 DIAGNOSIS — I5032 Chronic diastolic (congestive) heart failure: Secondary | ICD-10-CM | POA: Diagnosis not present

## 2017-06-17 DIAGNOSIS — I5032 Chronic diastolic (congestive) heart failure: Secondary | ICD-10-CM | POA: Diagnosis not present

## 2017-06-17 DIAGNOSIS — I10 Essential (primary) hypertension: Secondary | ICD-10-CM | POA: Diagnosis not present

## 2017-06-17 DIAGNOSIS — M6281 Muscle weakness (generalized): Secondary | ICD-10-CM | POA: Diagnosis not present

## 2017-06-17 DIAGNOSIS — I7389 Other specified peripheral vascular diseases: Secondary | ICD-10-CM | POA: Diagnosis not present

## 2017-06-17 DIAGNOSIS — R278 Other lack of coordination: Secondary | ICD-10-CM | POA: Diagnosis not present

## 2017-06-18 DIAGNOSIS — I10 Essential (primary) hypertension: Secondary | ICD-10-CM | POA: Diagnosis not present

## 2017-06-18 DIAGNOSIS — I7389 Other specified peripheral vascular diseases: Secondary | ICD-10-CM | POA: Diagnosis not present

## 2017-06-18 DIAGNOSIS — R278 Other lack of coordination: Secondary | ICD-10-CM | POA: Diagnosis not present

## 2017-06-18 DIAGNOSIS — M6281 Muscle weakness (generalized): Secondary | ICD-10-CM | POA: Diagnosis not present

## 2017-06-18 DIAGNOSIS — I5032 Chronic diastolic (congestive) heart failure: Secondary | ICD-10-CM | POA: Diagnosis not present

## 2017-06-19 DIAGNOSIS — R278 Other lack of coordination: Secondary | ICD-10-CM | POA: Diagnosis not present

## 2017-06-19 DIAGNOSIS — F419 Anxiety disorder, unspecified: Secondary | ICD-10-CM | POA: Diagnosis not present

## 2017-06-19 DIAGNOSIS — F339 Major depressive disorder, recurrent, unspecified: Secondary | ICD-10-CM | POA: Diagnosis not present

## 2017-06-19 DIAGNOSIS — I7389 Other specified peripheral vascular diseases: Secondary | ICD-10-CM | POA: Diagnosis not present

## 2017-06-19 DIAGNOSIS — I5032 Chronic diastolic (congestive) heart failure: Secondary | ICD-10-CM | POA: Diagnosis not present

## 2017-06-19 DIAGNOSIS — M6281 Muscle weakness (generalized): Secondary | ICD-10-CM | POA: Diagnosis not present

## 2017-06-19 DIAGNOSIS — I10 Essential (primary) hypertension: Secondary | ICD-10-CM | POA: Diagnosis not present

## 2017-06-20 DIAGNOSIS — I5032 Chronic diastolic (congestive) heart failure: Secondary | ICD-10-CM | POA: Diagnosis not present

## 2017-06-20 DIAGNOSIS — I10 Essential (primary) hypertension: Secondary | ICD-10-CM | POA: Diagnosis not present

## 2017-06-20 DIAGNOSIS — I7389 Other specified peripheral vascular diseases: Secondary | ICD-10-CM | POA: Diagnosis not present

## 2017-06-20 DIAGNOSIS — M6281 Muscle weakness (generalized): Secondary | ICD-10-CM | POA: Diagnosis not present

## 2017-06-20 DIAGNOSIS — R278 Other lack of coordination: Secondary | ICD-10-CM | POA: Diagnosis not present

## 2017-06-23 DIAGNOSIS — I10 Essential (primary) hypertension: Secondary | ICD-10-CM | POA: Diagnosis not present

## 2017-06-23 DIAGNOSIS — M6281 Muscle weakness (generalized): Secondary | ICD-10-CM | POA: Diagnosis not present

## 2017-06-23 DIAGNOSIS — I7389 Other specified peripheral vascular diseases: Secondary | ICD-10-CM | POA: Diagnosis not present

## 2017-06-23 DIAGNOSIS — R278 Other lack of coordination: Secondary | ICD-10-CM | POA: Diagnosis not present

## 2017-06-23 DIAGNOSIS — I5032 Chronic diastolic (congestive) heart failure: Secondary | ICD-10-CM | POA: Diagnosis not present

## 2017-06-24 DIAGNOSIS — I10 Essential (primary) hypertension: Secondary | ICD-10-CM | POA: Diagnosis not present

## 2017-06-24 DIAGNOSIS — I7389 Other specified peripheral vascular diseases: Secondary | ICD-10-CM | POA: Diagnosis not present

## 2017-06-24 DIAGNOSIS — I5032 Chronic diastolic (congestive) heart failure: Secondary | ICD-10-CM | POA: Diagnosis not present

## 2017-06-24 DIAGNOSIS — M6281 Muscle weakness (generalized): Secondary | ICD-10-CM | POA: Diagnosis not present

## 2017-06-24 DIAGNOSIS — R278 Other lack of coordination: Secondary | ICD-10-CM | POA: Diagnosis not present

## 2017-06-25 DIAGNOSIS — I7389 Other specified peripheral vascular diseases: Secondary | ICD-10-CM | POA: Diagnosis not present

## 2017-06-25 DIAGNOSIS — M6281 Muscle weakness (generalized): Secondary | ICD-10-CM | POA: Diagnosis not present

## 2017-06-25 DIAGNOSIS — R278 Other lack of coordination: Secondary | ICD-10-CM | POA: Diagnosis not present

## 2017-06-25 DIAGNOSIS — I5032 Chronic diastolic (congestive) heart failure: Secondary | ICD-10-CM | POA: Diagnosis not present

## 2017-06-25 DIAGNOSIS — I10 Essential (primary) hypertension: Secondary | ICD-10-CM | POA: Diagnosis not present

## 2017-06-26 DIAGNOSIS — I10 Essential (primary) hypertension: Secondary | ICD-10-CM | POA: Diagnosis not present

## 2017-06-26 DIAGNOSIS — D649 Anemia, unspecified: Secondary | ICD-10-CM | POA: Diagnosis not present

## 2017-06-26 DIAGNOSIS — J45909 Unspecified asthma, uncomplicated: Secondary | ICD-10-CM | POA: Diagnosis not present

## 2017-06-26 DIAGNOSIS — R278 Other lack of coordination: Secondary | ICD-10-CM | POA: Diagnosis not present

## 2017-06-26 DIAGNOSIS — I5032 Chronic diastolic (congestive) heart failure: Secondary | ICD-10-CM | POA: Diagnosis not present

## 2017-06-26 DIAGNOSIS — K219 Gastro-esophageal reflux disease without esophagitis: Secondary | ICD-10-CM | POA: Diagnosis not present

## 2017-06-26 DIAGNOSIS — M6281 Muscle weakness (generalized): Secondary | ICD-10-CM | POA: Diagnosis not present

## 2017-06-26 DIAGNOSIS — I7389 Other specified peripheral vascular diseases: Secondary | ICD-10-CM | POA: Diagnosis not present

## 2017-06-26 DIAGNOSIS — Z5181 Encounter for therapeutic drug level monitoring: Secondary | ICD-10-CM | POA: Diagnosis not present

## 2017-06-27 DIAGNOSIS — I10 Essential (primary) hypertension: Secondary | ICD-10-CM | POA: Diagnosis not present

## 2017-06-27 DIAGNOSIS — M6281 Muscle weakness (generalized): Secondary | ICD-10-CM | POA: Diagnosis not present

## 2017-06-27 DIAGNOSIS — I5032 Chronic diastolic (congestive) heart failure: Secondary | ICD-10-CM | POA: Diagnosis not present

## 2017-06-27 DIAGNOSIS — I7389 Other specified peripheral vascular diseases: Secondary | ICD-10-CM | POA: Diagnosis not present

## 2017-06-27 DIAGNOSIS — R278 Other lack of coordination: Secondary | ICD-10-CM | POA: Diagnosis not present

## 2017-06-30 DIAGNOSIS — M6281 Muscle weakness (generalized): Secondary | ICD-10-CM | POA: Diagnosis not present

## 2017-06-30 DIAGNOSIS — I10 Essential (primary) hypertension: Secondary | ICD-10-CM | POA: Diagnosis not present

## 2017-06-30 DIAGNOSIS — I7389 Other specified peripheral vascular diseases: Secondary | ICD-10-CM | POA: Diagnosis not present

## 2017-06-30 DIAGNOSIS — R278 Other lack of coordination: Secondary | ICD-10-CM | POA: Diagnosis not present

## 2017-06-30 DIAGNOSIS — I5032 Chronic diastolic (congestive) heart failure: Secondary | ICD-10-CM | POA: Diagnosis not present

## 2017-07-01 DIAGNOSIS — M6281 Muscle weakness (generalized): Secondary | ICD-10-CM | POA: Diagnosis not present

## 2017-07-01 DIAGNOSIS — I7389 Other specified peripheral vascular diseases: Secondary | ICD-10-CM | POA: Diagnosis not present

## 2017-07-01 DIAGNOSIS — R278 Other lack of coordination: Secondary | ICD-10-CM | POA: Diagnosis not present

## 2017-07-01 DIAGNOSIS — I10 Essential (primary) hypertension: Secondary | ICD-10-CM | POA: Diagnosis not present

## 2017-07-01 DIAGNOSIS — I5032 Chronic diastolic (congestive) heart failure: Secondary | ICD-10-CM | POA: Diagnosis not present

## 2017-07-02 DIAGNOSIS — M6281 Muscle weakness (generalized): Secondary | ICD-10-CM | POA: Diagnosis not present

## 2017-07-02 DIAGNOSIS — I7389 Other specified peripheral vascular diseases: Secondary | ICD-10-CM | POA: Diagnosis not present

## 2017-07-02 DIAGNOSIS — R278 Other lack of coordination: Secondary | ICD-10-CM | POA: Diagnosis not present

## 2017-07-02 DIAGNOSIS — I5032 Chronic diastolic (congestive) heart failure: Secondary | ICD-10-CM | POA: Diagnosis not present

## 2017-07-02 DIAGNOSIS — I10 Essential (primary) hypertension: Secondary | ICD-10-CM | POA: Diagnosis not present

## 2017-07-03 DIAGNOSIS — I7389 Other specified peripheral vascular diseases: Secondary | ICD-10-CM | POA: Diagnosis not present

## 2017-07-03 DIAGNOSIS — I5032 Chronic diastolic (congestive) heart failure: Secondary | ICD-10-CM | POA: Diagnosis not present

## 2017-07-03 DIAGNOSIS — M6281 Muscle weakness (generalized): Secondary | ICD-10-CM | POA: Diagnosis not present

## 2017-07-03 DIAGNOSIS — I10 Essential (primary) hypertension: Secondary | ICD-10-CM | POA: Diagnosis not present

## 2017-07-03 DIAGNOSIS — R278 Other lack of coordination: Secondary | ICD-10-CM | POA: Diagnosis not present

## 2017-07-04 DIAGNOSIS — I5032 Chronic diastolic (congestive) heart failure: Secondary | ICD-10-CM | POA: Diagnosis not present

## 2017-07-04 DIAGNOSIS — M6281 Muscle weakness (generalized): Secondary | ICD-10-CM | POA: Diagnosis not present

## 2017-07-04 DIAGNOSIS — I7389 Other specified peripheral vascular diseases: Secondary | ICD-10-CM | POA: Diagnosis not present

## 2017-07-04 DIAGNOSIS — I10 Essential (primary) hypertension: Secondary | ICD-10-CM | POA: Diagnosis not present

## 2017-07-04 DIAGNOSIS — R278 Other lack of coordination: Secondary | ICD-10-CM | POA: Diagnosis not present

## 2017-07-07 DIAGNOSIS — F39 Unspecified mood [affective] disorder: Secondary | ICD-10-CM | POA: Diagnosis not present

## 2017-07-07 DIAGNOSIS — R278 Other lack of coordination: Secondary | ICD-10-CM | POA: Diagnosis not present

## 2017-07-07 DIAGNOSIS — I7389 Other specified peripheral vascular diseases: Secondary | ICD-10-CM | POA: Diagnosis not present

## 2017-07-07 DIAGNOSIS — F419 Anxiety disorder, unspecified: Secondary | ICD-10-CM | POA: Diagnosis not present

## 2017-07-07 DIAGNOSIS — M6281 Muscle weakness (generalized): Secondary | ICD-10-CM | POA: Diagnosis not present

## 2017-07-07 DIAGNOSIS — I10 Essential (primary) hypertension: Secondary | ICD-10-CM | POA: Diagnosis not present

## 2017-07-07 DIAGNOSIS — I5032 Chronic diastolic (congestive) heart failure: Secondary | ICD-10-CM | POA: Diagnosis not present

## 2017-07-08 DIAGNOSIS — R278 Other lack of coordination: Secondary | ICD-10-CM | POA: Diagnosis not present

## 2017-07-08 DIAGNOSIS — I10 Essential (primary) hypertension: Secondary | ICD-10-CM | POA: Diagnosis not present

## 2017-07-08 DIAGNOSIS — I5032 Chronic diastolic (congestive) heart failure: Secondary | ICD-10-CM | POA: Diagnosis not present

## 2017-07-08 DIAGNOSIS — I7389 Other specified peripheral vascular diseases: Secondary | ICD-10-CM | POA: Diagnosis not present

## 2017-07-08 DIAGNOSIS — M6281 Muscle weakness (generalized): Secondary | ICD-10-CM | POA: Diagnosis not present

## 2017-07-09 DIAGNOSIS — M6281 Muscle weakness (generalized): Secondary | ICD-10-CM | POA: Diagnosis not present

## 2017-07-09 DIAGNOSIS — I7389 Other specified peripheral vascular diseases: Secondary | ICD-10-CM | POA: Diagnosis not present

## 2017-07-09 DIAGNOSIS — R278 Other lack of coordination: Secondary | ICD-10-CM | POA: Diagnosis not present

## 2017-07-09 DIAGNOSIS — I5032 Chronic diastolic (congestive) heart failure: Secondary | ICD-10-CM | POA: Diagnosis not present

## 2017-07-09 DIAGNOSIS — I10 Essential (primary) hypertension: Secondary | ICD-10-CM | POA: Diagnosis not present

## 2017-07-10 DIAGNOSIS — I7389 Other specified peripheral vascular diseases: Secondary | ICD-10-CM | POA: Diagnosis not present

## 2017-07-10 DIAGNOSIS — M6281 Muscle weakness (generalized): Secondary | ICD-10-CM | POA: Diagnosis not present

## 2017-07-10 DIAGNOSIS — I5032 Chronic diastolic (congestive) heart failure: Secondary | ICD-10-CM | POA: Diagnosis not present

## 2017-07-10 DIAGNOSIS — I10 Essential (primary) hypertension: Secondary | ICD-10-CM | POA: Diagnosis not present

## 2017-07-10 DIAGNOSIS — R278 Other lack of coordination: Secondary | ICD-10-CM | POA: Diagnosis not present

## 2017-07-11 DIAGNOSIS — I5032 Chronic diastolic (congestive) heart failure: Secondary | ICD-10-CM | POA: Diagnosis not present

## 2017-07-11 DIAGNOSIS — R278 Other lack of coordination: Secondary | ICD-10-CM | POA: Diagnosis not present

## 2017-07-11 DIAGNOSIS — I7389 Other specified peripheral vascular diseases: Secondary | ICD-10-CM | POA: Diagnosis not present

## 2017-07-11 DIAGNOSIS — M6281 Muscle weakness (generalized): Secondary | ICD-10-CM | POA: Diagnosis not present

## 2017-07-11 DIAGNOSIS — I10 Essential (primary) hypertension: Secondary | ICD-10-CM | POA: Diagnosis not present

## 2017-07-23 DIAGNOSIS — J45909 Unspecified asthma, uncomplicated: Secondary | ICD-10-CM | POA: Diagnosis not present

## 2017-07-23 DIAGNOSIS — I10 Essential (primary) hypertension: Secondary | ICD-10-CM | POA: Diagnosis not present

## 2017-07-23 DIAGNOSIS — K219 Gastro-esophageal reflux disease without esophagitis: Secondary | ICD-10-CM | POA: Diagnosis not present

## 2017-07-23 DIAGNOSIS — I5032 Chronic diastolic (congestive) heart failure: Secondary | ICD-10-CM | POA: Diagnosis not present

## 2017-08-06 DIAGNOSIS — M1712 Unilateral primary osteoarthritis, left knee: Secondary | ICD-10-CM | POA: Diagnosis not present

## 2017-08-06 DIAGNOSIS — M1711 Unilateral primary osteoarthritis, right knee: Secondary | ICD-10-CM | POA: Diagnosis not present

## 2017-08-06 DIAGNOSIS — M170 Bilateral primary osteoarthritis of knee: Secondary | ICD-10-CM | POA: Diagnosis not present

## 2017-08-08 DIAGNOSIS — Z993 Dependence on wheelchair: Secondary | ICD-10-CM | POA: Diagnosis not present

## 2017-08-08 DIAGNOSIS — Z6841 Body Mass Index (BMI) 40.0 and over, adult: Secondary | ICD-10-CM | POA: Diagnosis not present

## 2017-08-08 DIAGNOSIS — Z8673 Personal history of transient ischemic attack (TIA), and cerebral infarction without residual deficits: Secondary | ICD-10-CM | POA: Diagnosis not present

## 2017-08-08 DIAGNOSIS — M170 Bilateral primary osteoarthritis of knee: Secondary | ICD-10-CM | POA: Diagnosis not present

## 2017-08-11 DIAGNOSIS — R0989 Other specified symptoms and signs involving the circulatory and respiratory systems: Secondary | ICD-10-CM | POA: Diagnosis not present

## 2017-08-11 DIAGNOSIS — R05 Cough: Secondary | ICD-10-CM | POA: Diagnosis not present

## 2017-08-18 DIAGNOSIS — K219 Gastro-esophageal reflux disease without esophagitis: Secondary | ICD-10-CM | POA: Diagnosis not present

## 2017-08-18 DIAGNOSIS — I5032 Chronic diastolic (congestive) heart failure: Secondary | ICD-10-CM | POA: Diagnosis not present

## 2017-08-18 DIAGNOSIS — I10 Essential (primary) hypertension: Secondary | ICD-10-CM | POA: Diagnosis not present

## 2017-08-18 DIAGNOSIS — E079 Disorder of thyroid, unspecified: Secondary | ICD-10-CM | POA: Diagnosis not present

## 2017-08-28 DIAGNOSIS — J40 Bronchitis, not specified as acute or chronic: Secondary | ICD-10-CM | POA: Diagnosis not present

## 2017-08-28 DIAGNOSIS — M6281 Muscle weakness (generalized): Secondary | ICD-10-CM | POA: Diagnosis not present

## 2017-08-28 DIAGNOSIS — R1312 Dysphagia, oropharyngeal phase: Secondary | ICD-10-CM | POA: Diagnosis not present

## 2017-08-28 DIAGNOSIS — I7389 Other specified peripheral vascular diseases: Secondary | ICD-10-CM | POA: Diagnosis not present

## 2017-08-28 DIAGNOSIS — R278 Other lack of coordination: Secondary | ICD-10-CM | POA: Diagnosis not present

## 2017-08-29 DIAGNOSIS — J40 Bronchitis, not specified as acute or chronic: Secondary | ICD-10-CM | POA: Diagnosis not present

## 2017-08-29 DIAGNOSIS — M6281 Muscle weakness (generalized): Secondary | ICD-10-CM | POA: Diagnosis not present

## 2017-08-29 DIAGNOSIS — R1312 Dysphagia, oropharyngeal phase: Secondary | ICD-10-CM | POA: Diagnosis not present

## 2017-08-29 DIAGNOSIS — R278 Other lack of coordination: Secondary | ICD-10-CM | POA: Diagnosis not present

## 2017-08-29 DIAGNOSIS — I7389 Other specified peripheral vascular diseases: Secondary | ICD-10-CM | POA: Diagnosis not present

## 2017-09-01 DIAGNOSIS — M6281 Muscle weakness (generalized): Secondary | ICD-10-CM | POA: Diagnosis not present

## 2017-09-01 DIAGNOSIS — J40 Bronchitis, not specified as acute or chronic: Secondary | ICD-10-CM | POA: Diagnosis not present

## 2017-09-01 DIAGNOSIS — R278 Other lack of coordination: Secondary | ICD-10-CM | POA: Diagnosis not present

## 2017-09-01 DIAGNOSIS — R1312 Dysphagia, oropharyngeal phase: Secondary | ICD-10-CM | POA: Diagnosis not present

## 2017-09-01 DIAGNOSIS — I7389 Other specified peripheral vascular diseases: Secondary | ICD-10-CM | POA: Diagnosis not present

## 2017-09-04 DIAGNOSIS — J40 Bronchitis, not specified as acute or chronic: Secondary | ICD-10-CM | POA: Diagnosis not present

## 2017-09-04 DIAGNOSIS — R1312 Dysphagia, oropharyngeal phase: Secondary | ICD-10-CM | POA: Diagnosis not present

## 2017-09-04 DIAGNOSIS — I7389 Other specified peripheral vascular diseases: Secondary | ICD-10-CM | POA: Diagnosis not present

## 2017-09-04 DIAGNOSIS — R278 Other lack of coordination: Secondary | ICD-10-CM | POA: Diagnosis not present

## 2017-09-04 DIAGNOSIS — M6281 Muscle weakness (generalized): Secondary | ICD-10-CM | POA: Diagnosis not present

## 2017-09-05 DIAGNOSIS — R1312 Dysphagia, oropharyngeal phase: Secondary | ICD-10-CM | POA: Diagnosis not present

## 2017-09-05 DIAGNOSIS — I7389 Other specified peripheral vascular diseases: Secondary | ICD-10-CM | POA: Diagnosis not present

## 2017-09-05 DIAGNOSIS — R278 Other lack of coordination: Secondary | ICD-10-CM | POA: Diagnosis not present

## 2017-09-05 DIAGNOSIS — M6281 Muscle weakness (generalized): Secondary | ICD-10-CM | POA: Diagnosis not present

## 2017-09-05 DIAGNOSIS — J40 Bronchitis, not specified as acute or chronic: Secondary | ICD-10-CM | POA: Diagnosis not present

## 2017-09-08 DIAGNOSIS — I7389 Other specified peripheral vascular diseases: Secondary | ICD-10-CM | POA: Diagnosis not present

## 2017-09-08 DIAGNOSIS — M6281 Muscle weakness (generalized): Secondary | ICD-10-CM | POA: Diagnosis not present

## 2017-09-08 DIAGNOSIS — R278 Other lack of coordination: Secondary | ICD-10-CM | POA: Diagnosis not present

## 2017-09-08 DIAGNOSIS — R1312 Dysphagia, oropharyngeal phase: Secondary | ICD-10-CM | POA: Diagnosis not present

## 2017-09-08 DIAGNOSIS — J40 Bronchitis, not specified as acute or chronic: Secondary | ICD-10-CM | POA: Diagnosis not present

## 2017-09-09 DIAGNOSIS — R278 Other lack of coordination: Secondary | ICD-10-CM | POA: Diagnosis not present

## 2017-09-09 DIAGNOSIS — I7389 Other specified peripheral vascular diseases: Secondary | ICD-10-CM | POA: Diagnosis not present

## 2017-09-09 DIAGNOSIS — R1312 Dysphagia, oropharyngeal phase: Secondary | ICD-10-CM | POA: Diagnosis not present

## 2017-09-09 DIAGNOSIS — J40 Bronchitis, not specified as acute or chronic: Secondary | ICD-10-CM | POA: Diagnosis not present

## 2017-09-09 DIAGNOSIS — M6281 Muscle weakness (generalized): Secondary | ICD-10-CM | POA: Diagnosis not present

## 2017-09-10 DIAGNOSIS — I5032 Chronic diastolic (congestive) heart failure: Secondary | ICD-10-CM | POA: Diagnosis not present

## 2017-09-10 DIAGNOSIS — J40 Bronchitis, not specified as acute or chronic: Secondary | ICD-10-CM | POA: Diagnosis not present

## 2017-09-10 DIAGNOSIS — I10 Essential (primary) hypertension: Secondary | ICD-10-CM | POA: Diagnosis not present

## 2017-09-10 DIAGNOSIS — R1312 Dysphagia, oropharyngeal phase: Secondary | ICD-10-CM | POA: Diagnosis not present

## 2017-09-10 DIAGNOSIS — J45909 Unspecified asthma, uncomplicated: Secondary | ICD-10-CM | POA: Diagnosis not present

## 2017-09-10 DIAGNOSIS — K219 Gastro-esophageal reflux disease without esophagitis: Secondary | ICD-10-CM | POA: Diagnosis not present

## 2017-09-10 DIAGNOSIS — M6281 Muscle weakness (generalized): Secondary | ICD-10-CM | POA: Diagnosis not present

## 2017-09-10 DIAGNOSIS — R278 Other lack of coordination: Secondary | ICD-10-CM | POA: Diagnosis not present

## 2017-09-10 DIAGNOSIS — I7389 Other specified peripheral vascular diseases: Secondary | ICD-10-CM | POA: Diagnosis not present

## 2017-09-14 DIAGNOSIS — R0989 Other specified symptoms and signs involving the circulatory and respiratory systems: Secondary | ICD-10-CM | POA: Diagnosis not present

## 2017-09-15 DIAGNOSIS — R1312 Dysphagia, oropharyngeal phase: Secondary | ICD-10-CM | POA: Diagnosis not present

## 2017-09-15 DIAGNOSIS — M6281 Muscle weakness (generalized): Secondary | ICD-10-CM | POA: Diagnosis not present

## 2017-09-15 DIAGNOSIS — I7389 Other specified peripheral vascular diseases: Secondary | ICD-10-CM | POA: Diagnosis not present

## 2017-09-15 DIAGNOSIS — R278 Other lack of coordination: Secondary | ICD-10-CM | POA: Diagnosis not present

## 2017-09-15 DIAGNOSIS — J40 Bronchitis, not specified as acute or chronic: Secondary | ICD-10-CM | POA: Diagnosis not present

## 2017-09-16 DIAGNOSIS — R1312 Dysphagia, oropharyngeal phase: Secondary | ICD-10-CM | POA: Diagnosis not present

## 2017-09-16 DIAGNOSIS — I7389 Other specified peripheral vascular diseases: Secondary | ICD-10-CM | POA: Diagnosis not present

## 2017-09-16 DIAGNOSIS — J40 Bronchitis, not specified as acute or chronic: Secondary | ICD-10-CM | POA: Diagnosis not present

## 2017-09-16 DIAGNOSIS — R278 Other lack of coordination: Secondary | ICD-10-CM | POA: Diagnosis not present

## 2017-09-16 DIAGNOSIS — M6281 Muscle weakness (generalized): Secondary | ICD-10-CM | POA: Diagnosis not present

## 2017-09-17 DIAGNOSIS — J40 Bronchitis, not specified as acute or chronic: Secondary | ICD-10-CM | POA: Diagnosis not present

## 2017-09-17 DIAGNOSIS — I7389 Other specified peripheral vascular diseases: Secondary | ICD-10-CM | POA: Diagnosis not present

## 2017-09-17 DIAGNOSIS — R278 Other lack of coordination: Secondary | ICD-10-CM | POA: Diagnosis not present

## 2017-09-17 DIAGNOSIS — R1312 Dysphagia, oropharyngeal phase: Secondary | ICD-10-CM | POA: Diagnosis not present

## 2017-09-17 DIAGNOSIS — M6281 Muscle weakness (generalized): Secondary | ICD-10-CM | POA: Diagnosis not present

## 2017-09-18 DIAGNOSIS — M6281 Muscle weakness (generalized): Secondary | ICD-10-CM | POA: Diagnosis not present

## 2017-09-18 DIAGNOSIS — J40 Bronchitis, not specified as acute or chronic: Secondary | ICD-10-CM | POA: Diagnosis not present

## 2017-09-18 DIAGNOSIS — I7389 Other specified peripheral vascular diseases: Secondary | ICD-10-CM | POA: Diagnosis not present

## 2017-09-18 DIAGNOSIS — R1312 Dysphagia, oropharyngeal phase: Secondary | ICD-10-CM | POA: Diagnosis not present

## 2017-09-18 DIAGNOSIS — R278 Other lack of coordination: Secondary | ICD-10-CM | POA: Diagnosis not present

## 2017-09-19 DIAGNOSIS — M6281 Muscle weakness (generalized): Secondary | ICD-10-CM | POA: Diagnosis not present

## 2017-09-19 DIAGNOSIS — R1312 Dysphagia, oropharyngeal phase: Secondary | ICD-10-CM | POA: Diagnosis not present

## 2017-09-19 DIAGNOSIS — R278 Other lack of coordination: Secondary | ICD-10-CM | POA: Diagnosis not present

## 2017-09-19 DIAGNOSIS — J40 Bronchitis, not specified as acute or chronic: Secondary | ICD-10-CM | POA: Diagnosis not present

## 2017-09-19 DIAGNOSIS — I7389 Other specified peripheral vascular diseases: Secondary | ICD-10-CM | POA: Diagnosis not present

## 2017-09-22 DIAGNOSIS — R1312 Dysphagia, oropharyngeal phase: Secondary | ICD-10-CM | POA: Diagnosis not present

## 2017-09-22 DIAGNOSIS — I7389 Other specified peripheral vascular diseases: Secondary | ICD-10-CM | POA: Diagnosis not present

## 2017-09-22 DIAGNOSIS — J40 Bronchitis, not specified as acute or chronic: Secondary | ICD-10-CM | POA: Diagnosis not present

## 2017-09-22 DIAGNOSIS — K219 Gastro-esophageal reflux disease without esophagitis: Secondary | ICD-10-CM | POA: Diagnosis not present

## 2017-09-22 DIAGNOSIS — M6281 Muscle weakness (generalized): Secondary | ICD-10-CM | POA: Diagnosis not present

## 2017-09-22 DIAGNOSIS — R278 Other lack of coordination: Secondary | ICD-10-CM | POA: Diagnosis not present

## 2017-09-22 DIAGNOSIS — R498 Other voice and resonance disorders: Secondary | ICD-10-CM | POA: Diagnosis not present

## 2017-09-22 DIAGNOSIS — I10 Essential (primary) hypertension: Secondary | ICD-10-CM | POA: Diagnosis not present

## 2017-09-22 DIAGNOSIS — I5032 Chronic diastolic (congestive) heart failure: Secondary | ICD-10-CM | POA: Diagnosis not present

## 2017-09-23 DIAGNOSIS — R278 Other lack of coordination: Secondary | ICD-10-CM | POA: Diagnosis not present

## 2017-09-23 DIAGNOSIS — I10 Essential (primary) hypertension: Secondary | ICD-10-CM | POA: Diagnosis not present

## 2017-09-23 DIAGNOSIS — K219 Gastro-esophageal reflux disease without esophagitis: Secondary | ICD-10-CM | POA: Diagnosis not present

## 2017-09-23 DIAGNOSIS — I5032 Chronic diastolic (congestive) heart failure: Secondary | ICD-10-CM | POA: Diagnosis not present

## 2017-09-23 DIAGNOSIS — I7389 Other specified peripheral vascular diseases: Secondary | ICD-10-CM | POA: Diagnosis not present

## 2017-09-23 DIAGNOSIS — M6281 Muscle weakness (generalized): Secondary | ICD-10-CM | POA: Diagnosis not present

## 2017-09-24 DIAGNOSIS — L02422 Furuncle of left axilla: Secondary | ICD-10-CM | POA: Diagnosis not present

## 2017-09-24 DIAGNOSIS — I5032 Chronic diastolic (congestive) heart failure: Secondary | ICD-10-CM | POA: Diagnosis not present

## 2017-09-24 DIAGNOSIS — R278 Other lack of coordination: Secondary | ICD-10-CM | POA: Diagnosis not present

## 2017-09-24 DIAGNOSIS — K219 Gastro-esophageal reflux disease without esophagitis: Secondary | ICD-10-CM | POA: Diagnosis not present

## 2017-09-24 DIAGNOSIS — I10 Essential (primary) hypertension: Secondary | ICD-10-CM | POA: Diagnosis not present

## 2017-09-24 DIAGNOSIS — M6281 Muscle weakness (generalized): Secondary | ICD-10-CM | POA: Diagnosis not present

## 2017-09-24 DIAGNOSIS — I7389 Other specified peripheral vascular diseases: Secondary | ICD-10-CM | POA: Diagnosis not present

## 2017-09-25 DIAGNOSIS — I7389 Other specified peripheral vascular diseases: Secondary | ICD-10-CM | POA: Diagnosis not present

## 2017-09-25 DIAGNOSIS — Z6841 Body Mass Index (BMI) 40.0 and over, adult: Secondary | ICD-10-CM | POA: Diagnosis not present

## 2017-09-25 DIAGNOSIS — R278 Other lack of coordination: Secondary | ICD-10-CM | POA: Diagnosis not present

## 2017-09-25 DIAGNOSIS — Z993 Dependence on wheelchair: Secondary | ICD-10-CM | POA: Diagnosis not present

## 2017-09-25 DIAGNOSIS — M170 Bilateral primary osteoarthritis of knee: Secondary | ICD-10-CM | POA: Diagnosis not present

## 2017-09-25 DIAGNOSIS — I10 Essential (primary) hypertension: Secondary | ICD-10-CM | POA: Diagnosis not present

## 2017-09-25 DIAGNOSIS — I5032 Chronic diastolic (congestive) heart failure: Secondary | ICD-10-CM | POA: Diagnosis not present

## 2017-09-25 DIAGNOSIS — K219 Gastro-esophageal reflux disease without esophagitis: Secondary | ICD-10-CM | POA: Diagnosis not present

## 2017-09-25 DIAGNOSIS — M6281 Muscle weakness (generalized): Secondary | ICD-10-CM | POA: Diagnosis not present

## 2017-09-26 DIAGNOSIS — R278 Other lack of coordination: Secondary | ICD-10-CM | POA: Diagnosis not present

## 2017-09-26 DIAGNOSIS — K219 Gastro-esophageal reflux disease without esophagitis: Secondary | ICD-10-CM | POA: Diagnosis not present

## 2017-09-26 DIAGNOSIS — I7389 Other specified peripheral vascular diseases: Secondary | ICD-10-CM | POA: Diagnosis not present

## 2017-09-26 DIAGNOSIS — I5032 Chronic diastolic (congestive) heart failure: Secondary | ICD-10-CM | POA: Diagnosis not present

## 2017-09-26 DIAGNOSIS — I10 Essential (primary) hypertension: Secondary | ICD-10-CM | POA: Diagnosis not present

## 2017-09-26 DIAGNOSIS — M6281 Muscle weakness (generalized): Secondary | ICD-10-CM | POA: Diagnosis not present

## 2017-09-27 DIAGNOSIS — M6281 Muscle weakness (generalized): Secondary | ICD-10-CM | POA: Diagnosis not present

## 2017-09-27 DIAGNOSIS — R278 Other lack of coordination: Secondary | ICD-10-CM | POA: Diagnosis not present

## 2017-09-27 DIAGNOSIS — K219 Gastro-esophageal reflux disease without esophagitis: Secondary | ICD-10-CM | POA: Diagnosis not present

## 2017-09-27 DIAGNOSIS — I7389 Other specified peripheral vascular diseases: Secondary | ICD-10-CM | POA: Diagnosis not present

## 2017-09-27 DIAGNOSIS — I10 Essential (primary) hypertension: Secondary | ICD-10-CM | POA: Diagnosis not present

## 2017-09-27 DIAGNOSIS — I5032 Chronic diastolic (congestive) heart failure: Secondary | ICD-10-CM | POA: Diagnosis not present

## 2017-09-28 DIAGNOSIS — K219 Gastro-esophageal reflux disease without esophagitis: Secondary | ICD-10-CM | POA: Diagnosis not present

## 2017-09-28 DIAGNOSIS — M6281 Muscle weakness (generalized): Secondary | ICD-10-CM | POA: Diagnosis not present

## 2017-09-28 DIAGNOSIS — I10 Essential (primary) hypertension: Secondary | ICD-10-CM | POA: Diagnosis not present

## 2017-09-28 DIAGNOSIS — R278 Other lack of coordination: Secondary | ICD-10-CM | POA: Diagnosis not present

## 2017-09-28 DIAGNOSIS — I5032 Chronic diastolic (congestive) heart failure: Secondary | ICD-10-CM | POA: Diagnosis not present

## 2017-09-28 DIAGNOSIS — I7389 Other specified peripheral vascular diseases: Secondary | ICD-10-CM | POA: Diagnosis not present

## 2017-09-29 DIAGNOSIS — R278 Other lack of coordination: Secondary | ICD-10-CM | POA: Diagnosis not present

## 2017-09-29 DIAGNOSIS — I10 Essential (primary) hypertension: Secondary | ICD-10-CM | POA: Diagnosis not present

## 2017-09-29 DIAGNOSIS — I5032 Chronic diastolic (congestive) heart failure: Secondary | ICD-10-CM | POA: Diagnosis not present

## 2017-09-29 DIAGNOSIS — I7389 Other specified peripheral vascular diseases: Secondary | ICD-10-CM | POA: Diagnosis not present

## 2017-09-29 DIAGNOSIS — K219 Gastro-esophageal reflux disease without esophagitis: Secondary | ICD-10-CM | POA: Diagnosis not present

## 2017-09-29 DIAGNOSIS — M6281 Muscle weakness (generalized): Secondary | ICD-10-CM | POA: Diagnosis not present

## 2017-09-30 DIAGNOSIS — L84 Corns and callosities: Secondary | ICD-10-CM | POA: Diagnosis not present

## 2017-09-30 DIAGNOSIS — I739 Peripheral vascular disease, unspecified: Secondary | ICD-10-CM | POA: Diagnosis not present

## 2017-09-30 DIAGNOSIS — R278 Other lack of coordination: Secondary | ICD-10-CM | POA: Diagnosis not present

## 2017-09-30 DIAGNOSIS — I7389 Other specified peripheral vascular diseases: Secondary | ICD-10-CM | POA: Diagnosis not present

## 2017-09-30 DIAGNOSIS — I10 Essential (primary) hypertension: Secondary | ICD-10-CM | POA: Diagnosis not present

## 2017-09-30 DIAGNOSIS — I5032 Chronic diastolic (congestive) heart failure: Secondary | ICD-10-CM | POA: Diagnosis not present

## 2017-09-30 DIAGNOSIS — B351 Tinea unguium: Secondary | ICD-10-CM | POA: Diagnosis not present

## 2017-09-30 DIAGNOSIS — M79675 Pain in left toe(s): Secondary | ICD-10-CM | POA: Diagnosis not present

## 2017-09-30 DIAGNOSIS — M6281 Muscle weakness (generalized): Secondary | ICD-10-CM | POA: Diagnosis not present

## 2017-09-30 DIAGNOSIS — K219 Gastro-esophageal reflux disease without esophagitis: Secondary | ICD-10-CM | POA: Diagnosis not present

## 2017-10-01 DIAGNOSIS — K219 Gastro-esophageal reflux disease without esophagitis: Secondary | ICD-10-CM | POA: Diagnosis not present

## 2017-10-01 DIAGNOSIS — J45909 Unspecified asthma, uncomplicated: Secondary | ICD-10-CM | POA: Diagnosis not present

## 2017-10-01 DIAGNOSIS — R278 Other lack of coordination: Secondary | ICD-10-CM | POA: Diagnosis not present

## 2017-10-01 DIAGNOSIS — I7389 Other specified peripheral vascular diseases: Secondary | ICD-10-CM | POA: Diagnosis not present

## 2017-10-01 DIAGNOSIS — M6281 Muscle weakness (generalized): Secondary | ICD-10-CM | POA: Diagnosis not present

## 2017-10-01 DIAGNOSIS — I10 Essential (primary) hypertension: Secondary | ICD-10-CM | POA: Diagnosis not present

## 2017-10-01 DIAGNOSIS — I5032 Chronic diastolic (congestive) heart failure: Secondary | ICD-10-CM | POA: Diagnosis not present

## 2017-10-01 DIAGNOSIS — H6121 Impacted cerumen, right ear: Secondary | ICD-10-CM | POA: Diagnosis not present

## 2017-10-02 DIAGNOSIS — K219 Gastro-esophageal reflux disease without esophagitis: Secondary | ICD-10-CM | POA: Diagnosis not present

## 2017-10-02 DIAGNOSIS — I7389 Other specified peripheral vascular diseases: Secondary | ICD-10-CM | POA: Diagnosis not present

## 2017-10-02 DIAGNOSIS — I10 Essential (primary) hypertension: Secondary | ICD-10-CM | POA: Diagnosis not present

## 2017-10-02 DIAGNOSIS — I5032 Chronic diastolic (congestive) heart failure: Secondary | ICD-10-CM | POA: Diagnosis not present

## 2017-10-02 DIAGNOSIS — M6281 Muscle weakness (generalized): Secondary | ICD-10-CM | POA: Diagnosis not present

## 2017-10-02 DIAGNOSIS — R278 Other lack of coordination: Secondary | ICD-10-CM | POA: Diagnosis not present

## 2017-10-03 DIAGNOSIS — M6281 Muscle weakness (generalized): Secondary | ICD-10-CM | POA: Diagnosis not present

## 2017-10-03 DIAGNOSIS — I10 Essential (primary) hypertension: Secondary | ICD-10-CM | POA: Diagnosis not present

## 2017-10-03 DIAGNOSIS — K219 Gastro-esophageal reflux disease without esophagitis: Secondary | ICD-10-CM | POA: Diagnosis not present

## 2017-10-03 DIAGNOSIS — I5032 Chronic diastolic (congestive) heart failure: Secondary | ICD-10-CM | POA: Diagnosis not present

## 2017-10-03 DIAGNOSIS — R278 Other lack of coordination: Secondary | ICD-10-CM | POA: Diagnosis not present

## 2017-10-03 DIAGNOSIS — I7389 Other specified peripheral vascular diseases: Secondary | ICD-10-CM | POA: Diagnosis not present

## 2017-10-06 DIAGNOSIS — I10 Essential (primary) hypertension: Secondary | ICD-10-CM | POA: Diagnosis not present

## 2017-10-06 DIAGNOSIS — K219 Gastro-esophageal reflux disease without esophagitis: Secondary | ICD-10-CM | POA: Diagnosis not present

## 2017-10-06 DIAGNOSIS — I7389 Other specified peripheral vascular diseases: Secondary | ICD-10-CM | POA: Diagnosis not present

## 2017-10-06 DIAGNOSIS — I5032 Chronic diastolic (congestive) heart failure: Secondary | ICD-10-CM | POA: Diagnosis not present

## 2017-10-06 DIAGNOSIS — R278 Other lack of coordination: Secondary | ICD-10-CM | POA: Diagnosis not present

## 2017-10-06 DIAGNOSIS — M6281 Muscle weakness (generalized): Secondary | ICD-10-CM | POA: Diagnosis not present

## 2017-10-08 DIAGNOSIS — I5032 Chronic diastolic (congestive) heart failure: Secondary | ICD-10-CM | POA: Diagnosis not present

## 2017-10-08 DIAGNOSIS — M6281 Muscle weakness (generalized): Secondary | ICD-10-CM | POA: Diagnosis not present

## 2017-10-08 DIAGNOSIS — I10 Essential (primary) hypertension: Secondary | ICD-10-CM | POA: Diagnosis not present

## 2017-10-08 DIAGNOSIS — R278 Other lack of coordination: Secondary | ICD-10-CM | POA: Diagnosis not present

## 2017-10-08 DIAGNOSIS — I7389 Other specified peripheral vascular diseases: Secondary | ICD-10-CM | POA: Diagnosis not present

## 2017-10-08 DIAGNOSIS — K219 Gastro-esophageal reflux disease without esophagitis: Secondary | ICD-10-CM | POA: Diagnosis not present

## 2017-10-09 DIAGNOSIS — M170 Bilateral primary osteoarthritis of knee: Secondary | ICD-10-CM | POA: Diagnosis not present

## 2017-10-10 DIAGNOSIS — I5032 Chronic diastolic (congestive) heart failure: Secondary | ICD-10-CM | POA: Diagnosis not present

## 2017-10-10 DIAGNOSIS — M6281 Muscle weakness (generalized): Secondary | ICD-10-CM | POA: Diagnosis not present

## 2017-10-10 DIAGNOSIS — K219 Gastro-esophageal reflux disease without esophagitis: Secondary | ICD-10-CM | POA: Diagnosis not present

## 2017-10-10 DIAGNOSIS — I7389 Other specified peripheral vascular diseases: Secondary | ICD-10-CM | POA: Diagnosis not present

## 2017-10-10 DIAGNOSIS — I10 Essential (primary) hypertension: Secondary | ICD-10-CM | POA: Diagnosis not present

## 2017-10-10 DIAGNOSIS — R278 Other lack of coordination: Secondary | ICD-10-CM | POA: Diagnosis not present

## 2017-10-14 DIAGNOSIS — I10 Essential (primary) hypertension: Secondary | ICD-10-CM | POA: Diagnosis not present

## 2017-10-14 DIAGNOSIS — M6281 Muscle weakness (generalized): Secondary | ICD-10-CM | POA: Diagnosis not present

## 2017-10-14 DIAGNOSIS — K219 Gastro-esophageal reflux disease without esophagitis: Secondary | ICD-10-CM | POA: Diagnosis not present

## 2017-10-14 DIAGNOSIS — I5032 Chronic diastolic (congestive) heart failure: Secondary | ICD-10-CM | POA: Diagnosis not present

## 2017-10-14 DIAGNOSIS — I7389 Other specified peripheral vascular diseases: Secondary | ICD-10-CM | POA: Diagnosis not present

## 2017-10-14 DIAGNOSIS — R278 Other lack of coordination: Secondary | ICD-10-CM | POA: Diagnosis not present

## 2017-10-15 DIAGNOSIS — I5032 Chronic diastolic (congestive) heart failure: Secondary | ICD-10-CM | POA: Diagnosis not present

## 2017-10-15 DIAGNOSIS — R278 Other lack of coordination: Secondary | ICD-10-CM | POA: Diagnosis not present

## 2017-10-15 DIAGNOSIS — M6281 Muscle weakness (generalized): Secondary | ICD-10-CM | POA: Diagnosis not present

## 2017-10-15 DIAGNOSIS — I10 Essential (primary) hypertension: Secondary | ICD-10-CM | POA: Diagnosis not present

## 2017-10-15 DIAGNOSIS — K219 Gastro-esophageal reflux disease without esophagitis: Secondary | ICD-10-CM | POA: Diagnosis not present

## 2017-10-15 DIAGNOSIS — I7389 Other specified peripheral vascular diseases: Secondary | ICD-10-CM | POA: Diagnosis not present

## 2017-10-17 DIAGNOSIS — I5032 Chronic diastolic (congestive) heart failure: Secondary | ICD-10-CM | POA: Diagnosis not present

## 2017-10-17 DIAGNOSIS — M6281 Muscle weakness (generalized): Secondary | ICD-10-CM | POA: Diagnosis not present

## 2017-10-17 DIAGNOSIS — I10 Essential (primary) hypertension: Secondary | ICD-10-CM | POA: Diagnosis not present

## 2017-10-17 DIAGNOSIS — R278 Other lack of coordination: Secondary | ICD-10-CM | POA: Diagnosis not present

## 2017-10-17 DIAGNOSIS — I7389 Other specified peripheral vascular diseases: Secondary | ICD-10-CM | POA: Diagnosis not present

## 2017-10-17 DIAGNOSIS — K219 Gastro-esophageal reflux disease without esophagitis: Secondary | ICD-10-CM | POA: Diagnosis not present

## 2017-10-20 DIAGNOSIS — I10 Essential (primary) hypertension: Secondary | ICD-10-CM | POA: Diagnosis not present

## 2017-10-20 DIAGNOSIS — I7389 Other specified peripheral vascular diseases: Secondary | ICD-10-CM | POA: Diagnosis not present

## 2017-10-20 DIAGNOSIS — M6281 Muscle weakness (generalized): Secondary | ICD-10-CM | POA: Diagnosis not present

## 2017-10-20 DIAGNOSIS — I5032 Chronic diastolic (congestive) heart failure: Secondary | ICD-10-CM | POA: Diagnosis not present

## 2017-10-20 DIAGNOSIS — R278 Other lack of coordination: Secondary | ICD-10-CM | POA: Diagnosis not present

## 2017-10-22 DIAGNOSIS — I10 Essential (primary) hypertension: Secondary | ICD-10-CM | POA: Diagnosis not present

## 2017-10-22 DIAGNOSIS — M6281 Muscle weakness (generalized): Secondary | ICD-10-CM | POA: Diagnosis not present

## 2017-10-22 DIAGNOSIS — R278 Other lack of coordination: Secondary | ICD-10-CM | POA: Diagnosis not present

## 2017-10-22 DIAGNOSIS — I7389 Other specified peripheral vascular diseases: Secondary | ICD-10-CM | POA: Diagnosis not present

## 2017-10-22 DIAGNOSIS — I5032 Chronic diastolic (congestive) heart failure: Secondary | ICD-10-CM | POA: Diagnosis not present

## 2017-10-24 DIAGNOSIS — I7389 Other specified peripheral vascular diseases: Secondary | ICD-10-CM | POA: Diagnosis not present

## 2017-10-24 DIAGNOSIS — I5032 Chronic diastolic (congestive) heart failure: Secondary | ICD-10-CM | POA: Diagnosis not present

## 2017-10-24 DIAGNOSIS — I10 Essential (primary) hypertension: Secondary | ICD-10-CM | POA: Diagnosis not present

## 2017-10-24 DIAGNOSIS — M6281 Muscle weakness (generalized): Secondary | ICD-10-CM | POA: Diagnosis not present

## 2017-10-24 DIAGNOSIS — R278 Other lack of coordination: Secondary | ICD-10-CM | POA: Diagnosis not present

## 2017-10-27 DIAGNOSIS — R278 Other lack of coordination: Secondary | ICD-10-CM | POA: Diagnosis not present

## 2017-10-27 DIAGNOSIS — I10 Essential (primary) hypertension: Secondary | ICD-10-CM | POA: Diagnosis not present

## 2017-10-27 DIAGNOSIS — I5032 Chronic diastolic (congestive) heart failure: Secondary | ICD-10-CM | POA: Diagnosis not present

## 2017-10-27 DIAGNOSIS — I7389 Other specified peripheral vascular diseases: Secondary | ICD-10-CM | POA: Diagnosis not present

## 2017-10-27 DIAGNOSIS — M6281 Muscle weakness (generalized): Secondary | ICD-10-CM | POA: Diagnosis not present

## 2017-10-28 DIAGNOSIS — I10 Essential (primary) hypertension: Secondary | ICD-10-CM | POA: Diagnosis not present

## 2017-10-28 DIAGNOSIS — R278 Other lack of coordination: Secondary | ICD-10-CM | POA: Diagnosis not present

## 2017-10-28 DIAGNOSIS — I5032 Chronic diastolic (congestive) heart failure: Secondary | ICD-10-CM | POA: Diagnosis not present

## 2017-10-28 DIAGNOSIS — M6281 Muscle weakness (generalized): Secondary | ICD-10-CM | POA: Diagnosis not present

## 2017-10-28 DIAGNOSIS — I7389 Other specified peripheral vascular diseases: Secondary | ICD-10-CM | POA: Diagnosis not present

## 2017-10-30 DIAGNOSIS — I5032 Chronic diastolic (congestive) heart failure: Secondary | ICD-10-CM | POA: Diagnosis not present

## 2017-10-30 DIAGNOSIS — I7389 Other specified peripheral vascular diseases: Secondary | ICD-10-CM | POA: Diagnosis not present

## 2017-10-30 DIAGNOSIS — M6281 Muscle weakness (generalized): Secondary | ICD-10-CM | POA: Diagnosis not present

## 2017-10-30 DIAGNOSIS — K219 Gastro-esophageal reflux disease without esophagitis: Secondary | ICD-10-CM | POA: Diagnosis not present

## 2017-10-30 DIAGNOSIS — J45909 Unspecified asthma, uncomplicated: Secondary | ICD-10-CM | POA: Diagnosis not present

## 2017-10-30 DIAGNOSIS — I10 Essential (primary) hypertension: Secondary | ICD-10-CM | POA: Diagnosis not present

## 2017-10-30 DIAGNOSIS — R278 Other lack of coordination: Secondary | ICD-10-CM | POA: Diagnosis not present

## 2017-11-04 DIAGNOSIS — I7389 Other specified peripheral vascular diseases: Secondary | ICD-10-CM | POA: Diagnosis not present

## 2017-11-04 DIAGNOSIS — M6281 Muscle weakness (generalized): Secondary | ICD-10-CM | POA: Diagnosis not present

## 2017-11-04 DIAGNOSIS — R278 Other lack of coordination: Secondary | ICD-10-CM | POA: Diagnosis not present

## 2017-11-04 DIAGNOSIS — I5032 Chronic diastolic (congestive) heart failure: Secondary | ICD-10-CM | POA: Diagnosis not present

## 2017-11-04 DIAGNOSIS — I10 Essential (primary) hypertension: Secondary | ICD-10-CM | POA: Diagnosis not present

## 2017-11-05 DIAGNOSIS — Z5181 Encounter for therapeutic drug level monitoring: Secondary | ICD-10-CM | POA: Diagnosis not present

## 2017-11-05 DIAGNOSIS — I10 Essential (primary) hypertension: Secondary | ICD-10-CM | POA: Diagnosis not present

## 2017-11-05 DIAGNOSIS — R278 Other lack of coordination: Secondary | ICD-10-CM | POA: Diagnosis not present

## 2017-11-05 DIAGNOSIS — M6281 Muscle weakness (generalized): Secondary | ICD-10-CM | POA: Diagnosis not present

## 2017-11-05 DIAGNOSIS — I7389 Other specified peripheral vascular diseases: Secondary | ICD-10-CM | POA: Diagnosis not present

## 2017-11-05 DIAGNOSIS — I5032 Chronic diastolic (congestive) heart failure: Secondary | ICD-10-CM | POA: Diagnosis not present

## 2017-11-07 DIAGNOSIS — R278 Other lack of coordination: Secondary | ICD-10-CM | POA: Diagnosis not present

## 2017-11-07 DIAGNOSIS — I5032 Chronic diastolic (congestive) heart failure: Secondary | ICD-10-CM | POA: Diagnosis not present

## 2017-11-07 DIAGNOSIS — I7389 Other specified peripheral vascular diseases: Secondary | ICD-10-CM | POA: Diagnosis not present

## 2017-11-07 DIAGNOSIS — M6281 Muscle weakness (generalized): Secondary | ICD-10-CM | POA: Diagnosis not present

## 2017-11-07 DIAGNOSIS — I10 Essential (primary) hypertension: Secondary | ICD-10-CM | POA: Diagnosis not present

## 2017-11-10 DIAGNOSIS — M6281 Muscle weakness (generalized): Secondary | ICD-10-CM | POA: Diagnosis not present

## 2017-11-10 DIAGNOSIS — I10 Essential (primary) hypertension: Secondary | ICD-10-CM | POA: Diagnosis not present

## 2017-11-10 DIAGNOSIS — R278 Other lack of coordination: Secondary | ICD-10-CM | POA: Diagnosis not present

## 2017-11-10 DIAGNOSIS — I7389 Other specified peripheral vascular diseases: Secondary | ICD-10-CM | POA: Diagnosis not present

## 2017-11-10 DIAGNOSIS — I5032 Chronic diastolic (congestive) heart failure: Secondary | ICD-10-CM | POA: Diagnosis not present

## 2017-11-13 DIAGNOSIS — I7389 Other specified peripheral vascular diseases: Secondary | ICD-10-CM | POA: Diagnosis not present

## 2017-11-13 DIAGNOSIS — R278 Other lack of coordination: Secondary | ICD-10-CM | POA: Diagnosis not present

## 2017-11-13 DIAGNOSIS — M6281 Muscle weakness (generalized): Secondary | ICD-10-CM | POA: Diagnosis not present

## 2017-11-13 DIAGNOSIS — I10 Essential (primary) hypertension: Secondary | ICD-10-CM | POA: Diagnosis not present

## 2017-11-13 DIAGNOSIS — I5032 Chronic diastolic (congestive) heart failure: Secondary | ICD-10-CM | POA: Diagnosis not present

## 2017-11-14 DIAGNOSIS — R278 Other lack of coordination: Secondary | ICD-10-CM | POA: Diagnosis not present

## 2017-11-14 DIAGNOSIS — M6281 Muscle weakness (generalized): Secondary | ICD-10-CM | POA: Diagnosis not present

## 2017-11-14 DIAGNOSIS — I5032 Chronic diastolic (congestive) heart failure: Secondary | ICD-10-CM | POA: Diagnosis not present

## 2017-11-14 DIAGNOSIS — I7389 Other specified peripheral vascular diseases: Secondary | ICD-10-CM | POA: Diagnosis not present

## 2017-11-14 DIAGNOSIS — I10 Essential (primary) hypertension: Secondary | ICD-10-CM | POA: Diagnosis not present

## 2017-11-17 DIAGNOSIS — I10 Essential (primary) hypertension: Secondary | ICD-10-CM | POA: Diagnosis not present

## 2017-11-17 DIAGNOSIS — I5032 Chronic diastolic (congestive) heart failure: Secondary | ICD-10-CM | POA: Diagnosis not present

## 2017-11-17 DIAGNOSIS — I7389 Other specified peripheral vascular diseases: Secondary | ICD-10-CM | POA: Diagnosis not present

## 2017-11-17 DIAGNOSIS — M6281 Muscle weakness (generalized): Secondary | ICD-10-CM | POA: Diagnosis not present

## 2017-11-17 DIAGNOSIS — R278 Other lack of coordination: Secondary | ICD-10-CM | POA: Diagnosis not present

## 2017-11-19 DIAGNOSIS — Z5181 Encounter for therapeutic drug level monitoring: Secondary | ICD-10-CM | POA: Diagnosis not present

## 2017-11-19 DIAGNOSIS — I10 Essential (primary) hypertension: Secondary | ICD-10-CM | POA: Diagnosis not present

## 2017-11-19 DIAGNOSIS — I7389 Other specified peripheral vascular diseases: Secondary | ICD-10-CM | POA: Diagnosis not present

## 2017-11-19 DIAGNOSIS — R278 Other lack of coordination: Secondary | ICD-10-CM | POA: Diagnosis not present

## 2017-11-19 DIAGNOSIS — I5032 Chronic diastolic (congestive) heart failure: Secondary | ICD-10-CM | POA: Diagnosis not present

## 2017-11-19 DIAGNOSIS — M6281 Muscle weakness (generalized): Secondary | ICD-10-CM | POA: Diagnosis not present

## 2017-11-21 DIAGNOSIS — I5032 Chronic diastolic (congestive) heart failure: Secondary | ICD-10-CM | POA: Diagnosis not present

## 2017-11-21 DIAGNOSIS — M6281 Muscle weakness (generalized): Secondary | ICD-10-CM | POA: Diagnosis not present

## 2017-11-21 DIAGNOSIS — I7389 Other specified peripheral vascular diseases: Secondary | ICD-10-CM | POA: Diagnosis not present

## 2017-11-21 DIAGNOSIS — I10 Essential (primary) hypertension: Secondary | ICD-10-CM | POA: Diagnosis not present

## 2017-11-21 DIAGNOSIS — R278 Other lack of coordination: Secondary | ICD-10-CM | POA: Diagnosis not present

## 2017-11-24 DIAGNOSIS — M6281 Muscle weakness (generalized): Secondary | ICD-10-CM | POA: Diagnosis not present

## 2017-11-24 DIAGNOSIS — I7389 Other specified peripheral vascular diseases: Secondary | ICD-10-CM | POA: Diagnosis not present

## 2017-11-24 DIAGNOSIS — I10 Essential (primary) hypertension: Secondary | ICD-10-CM | POA: Diagnosis not present

## 2017-11-24 DIAGNOSIS — I5032 Chronic diastolic (congestive) heart failure: Secondary | ICD-10-CM | POA: Diagnosis not present

## 2017-11-24 DIAGNOSIS — R278 Other lack of coordination: Secondary | ICD-10-CM | POA: Diagnosis not present

## 2017-11-26 DIAGNOSIS — M6281 Muscle weakness (generalized): Secondary | ICD-10-CM | POA: Diagnosis not present

## 2017-11-26 DIAGNOSIS — R278 Other lack of coordination: Secondary | ICD-10-CM | POA: Diagnosis not present

## 2017-11-26 DIAGNOSIS — I7389 Other specified peripheral vascular diseases: Secondary | ICD-10-CM | POA: Diagnosis not present

## 2017-11-26 DIAGNOSIS — I5032 Chronic diastolic (congestive) heart failure: Secondary | ICD-10-CM | POA: Diagnosis not present

## 2017-11-26 DIAGNOSIS — I10 Essential (primary) hypertension: Secondary | ICD-10-CM | POA: Diagnosis not present

## 2017-11-27 DIAGNOSIS — M170 Bilateral primary osteoarthritis of knee: Secondary | ICD-10-CM | POA: Diagnosis not present

## 2017-11-27 DIAGNOSIS — I5032 Chronic diastolic (congestive) heart failure: Secondary | ICD-10-CM | POA: Diagnosis not present

## 2017-11-27 DIAGNOSIS — I10 Essential (primary) hypertension: Secondary | ICD-10-CM | POA: Diagnosis not present

## 2017-11-27 DIAGNOSIS — K219 Gastro-esophageal reflux disease without esophagitis: Secondary | ICD-10-CM | POA: Diagnosis not present

## 2017-11-28 DIAGNOSIS — I7389 Other specified peripheral vascular diseases: Secondary | ICD-10-CM | POA: Diagnosis not present

## 2017-11-28 DIAGNOSIS — M6281 Muscle weakness (generalized): Secondary | ICD-10-CM | POA: Diagnosis not present

## 2017-11-28 DIAGNOSIS — R278 Other lack of coordination: Secondary | ICD-10-CM | POA: Diagnosis not present

## 2017-11-28 DIAGNOSIS — I10 Essential (primary) hypertension: Secondary | ICD-10-CM | POA: Diagnosis not present

## 2017-11-28 DIAGNOSIS — I5032 Chronic diastolic (congestive) heart failure: Secondary | ICD-10-CM | POA: Diagnosis not present

## 2017-12-03 DIAGNOSIS — I10 Essential (primary) hypertension: Secondary | ICD-10-CM | POA: Diagnosis not present

## 2017-12-03 DIAGNOSIS — K219 Gastro-esophageal reflux disease without esophagitis: Secondary | ICD-10-CM | POA: Diagnosis not present

## 2017-12-03 DIAGNOSIS — I5032 Chronic diastolic (congestive) heart failure: Secondary | ICD-10-CM | POA: Diagnosis not present

## 2017-12-03 DIAGNOSIS — J45909 Unspecified asthma, uncomplicated: Secondary | ICD-10-CM | POA: Diagnosis not present

## 2017-12-11 DIAGNOSIS — M19042 Primary osteoarthritis, left hand: Secondary | ICD-10-CM | POA: Diagnosis not present

## 2017-12-11 DIAGNOSIS — M19012 Primary osteoarthritis, left shoulder: Secondary | ICD-10-CM | POA: Diagnosis not present

## 2017-12-23 DIAGNOSIS — M6281 Muscle weakness (generalized): Secondary | ICD-10-CM | POA: Diagnosis not present

## 2017-12-23 DIAGNOSIS — R531 Weakness: Secondary | ICD-10-CM | POA: Diagnosis not present

## 2017-12-23 DIAGNOSIS — R278 Other lack of coordination: Secondary | ICD-10-CM | POA: Diagnosis not present

## 2017-12-23 DIAGNOSIS — I10 Essential (primary) hypertension: Secondary | ICD-10-CM | POA: Diagnosis not present

## 2017-12-23 DIAGNOSIS — I7389 Other specified peripheral vascular diseases: Secondary | ICD-10-CM | POA: Diagnosis not present

## 2017-12-23 DIAGNOSIS — M170 Bilateral primary osteoarthritis of knee: Secondary | ICD-10-CM | POA: Diagnosis not present

## 2017-12-23 DIAGNOSIS — R296 Repeated falls: Secondary | ICD-10-CM | POA: Diagnosis not present

## 2017-12-24 DIAGNOSIS — R296 Repeated falls: Secondary | ICD-10-CM | POA: Diagnosis not present

## 2017-12-24 DIAGNOSIS — K219 Gastro-esophageal reflux disease without esophagitis: Secondary | ICD-10-CM | POA: Diagnosis not present

## 2017-12-24 DIAGNOSIS — R531 Weakness: Secondary | ICD-10-CM | POA: Diagnosis not present

## 2017-12-24 DIAGNOSIS — I10 Essential (primary) hypertension: Secondary | ICD-10-CM | POA: Diagnosis not present

## 2017-12-24 DIAGNOSIS — M170 Bilateral primary osteoarthritis of knee: Secondary | ICD-10-CM | POA: Diagnosis not present

## 2017-12-24 DIAGNOSIS — R278 Other lack of coordination: Secondary | ICD-10-CM | POA: Diagnosis not present

## 2017-12-24 DIAGNOSIS — I5032 Chronic diastolic (congestive) heart failure: Secondary | ICD-10-CM | POA: Diagnosis not present

## 2017-12-24 DIAGNOSIS — I7389 Other specified peripheral vascular diseases: Secondary | ICD-10-CM | POA: Diagnosis not present

## 2017-12-24 DIAGNOSIS — J45909 Unspecified asthma, uncomplicated: Secondary | ICD-10-CM | POA: Diagnosis not present

## 2017-12-25 DIAGNOSIS — I10 Essential (primary) hypertension: Secondary | ICD-10-CM | POA: Diagnosis not present

## 2017-12-25 DIAGNOSIS — R278 Other lack of coordination: Secondary | ICD-10-CM | POA: Diagnosis not present

## 2017-12-25 DIAGNOSIS — R296 Repeated falls: Secondary | ICD-10-CM | POA: Diagnosis not present

## 2017-12-25 DIAGNOSIS — I7389 Other specified peripheral vascular diseases: Secondary | ICD-10-CM | POA: Diagnosis not present

## 2017-12-25 DIAGNOSIS — M170 Bilateral primary osteoarthritis of knee: Secondary | ICD-10-CM | POA: Diagnosis not present

## 2017-12-25 DIAGNOSIS — R531 Weakness: Secondary | ICD-10-CM | POA: Diagnosis not present

## 2017-12-26 DIAGNOSIS — R531 Weakness: Secondary | ICD-10-CM | POA: Diagnosis not present

## 2017-12-26 DIAGNOSIS — I10 Essential (primary) hypertension: Secondary | ICD-10-CM | POA: Diagnosis not present

## 2017-12-26 DIAGNOSIS — I7389 Other specified peripheral vascular diseases: Secondary | ICD-10-CM | POA: Diagnosis not present

## 2017-12-26 DIAGNOSIS — R278 Other lack of coordination: Secondary | ICD-10-CM | POA: Diagnosis not present

## 2017-12-26 DIAGNOSIS — R296 Repeated falls: Secondary | ICD-10-CM | POA: Diagnosis not present

## 2017-12-26 DIAGNOSIS — M170 Bilateral primary osteoarthritis of knee: Secondary | ICD-10-CM | POA: Diagnosis not present

## 2017-12-29 DIAGNOSIS — R278 Other lack of coordination: Secondary | ICD-10-CM | POA: Diagnosis not present

## 2017-12-29 DIAGNOSIS — I10 Essential (primary) hypertension: Secondary | ICD-10-CM | POA: Diagnosis not present

## 2017-12-29 DIAGNOSIS — M170 Bilateral primary osteoarthritis of knee: Secondary | ICD-10-CM | POA: Diagnosis not present

## 2017-12-29 DIAGNOSIS — R296 Repeated falls: Secondary | ICD-10-CM | POA: Diagnosis not present

## 2017-12-29 DIAGNOSIS — I7389 Other specified peripheral vascular diseases: Secondary | ICD-10-CM | POA: Diagnosis not present

## 2017-12-29 DIAGNOSIS — R531 Weakness: Secondary | ICD-10-CM | POA: Diagnosis not present

## 2017-12-30 DIAGNOSIS — R531 Weakness: Secondary | ICD-10-CM | POA: Diagnosis not present

## 2017-12-30 DIAGNOSIS — R278 Other lack of coordination: Secondary | ICD-10-CM | POA: Diagnosis not present

## 2017-12-30 DIAGNOSIS — I7389 Other specified peripheral vascular diseases: Secondary | ICD-10-CM | POA: Diagnosis not present

## 2017-12-30 DIAGNOSIS — R296 Repeated falls: Secondary | ICD-10-CM | POA: Diagnosis not present

## 2017-12-30 DIAGNOSIS — I10 Essential (primary) hypertension: Secondary | ICD-10-CM | POA: Diagnosis not present

## 2017-12-30 DIAGNOSIS — M170 Bilateral primary osteoarthritis of knee: Secondary | ICD-10-CM | POA: Diagnosis not present

## 2017-12-31 DIAGNOSIS — I7389 Other specified peripheral vascular diseases: Secondary | ICD-10-CM | POA: Diagnosis not present

## 2017-12-31 DIAGNOSIS — R531 Weakness: Secondary | ICD-10-CM | POA: Diagnosis not present

## 2017-12-31 DIAGNOSIS — I10 Essential (primary) hypertension: Secondary | ICD-10-CM | POA: Diagnosis not present

## 2017-12-31 DIAGNOSIS — R296 Repeated falls: Secondary | ICD-10-CM | POA: Diagnosis not present

## 2017-12-31 DIAGNOSIS — R278 Other lack of coordination: Secondary | ICD-10-CM | POA: Diagnosis not present

## 2017-12-31 DIAGNOSIS — M170 Bilateral primary osteoarthritis of knee: Secondary | ICD-10-CM | POA: Diagnosis not present

## 2018-01-02 DIAGNOSIS — R296 Repeated falls: Secondary | ICD-10-CM | POA: Diagnosis not present

## 2018-01-02 DIAGNOSIS — R531 Weakness: Secondary | ICD-10-CM | POA: Diagnosis not present

## 2018-01-02 DIAGNOSIS — R278 Other lack of coordination: Secondary | ICD-10-CM | POA: Diagnosis not present

## 2018-01-02 DIAGNOSIS — I7389 Other specified peripheral vascular diseases: Secondary | ICD-10-CM | POA: Diagnosis not present

## 2018-01-02 DIAGNOSIS — I10 Essential (primary) hypertension: Secondary | ICD-10-CM | POA: Diagnosis not present

## 2018-01-02 DIAGNOSIS — M170 Bilateral primary osteoarthritis of knee: Secondary | ICD-10-CM | POA: Diagnosis not present

## 2018-01-03 DIAGNOSIS — R296 Repeated falls: Secondary | ICD-10-CM | POA: Diagnosis not present

## 2018-01-03 DIAGNOSIS — I7389 Other specified peripheral vascular diseases: Secondary | ICD-10-CM | POA: Diagnosis not present

## 2018-01-03 DIAGNOSIS — I10 Essential (primary) hypertension: Secondary | ICD-10-CM | POA: Diagnosis not present

## 2018-01-03 DIAGNOSIS — R278 Other lack of coordination: Secondary | ICD-10-CM | POA: Diagnosis not present

## 2018-01-03 DIAGNOSIS — M170 Bilateral primary osteoarthritis of knee: Secondary | ICD-10-CM | POA: Diagnosis not present

## 2018-01-03 DIAGNOSIS — R531 Weakness: Secondary | ICD-10-CM | POA: Diagnosis not present

## 2018-01-05 DIAGNOSIS — R296 Repeated falls: Secondary | ICD-10-CM | POA: Diagnosis not present

## 2018-01-05 DIAGNOSIS — I7389 Other specified peripheral vascular diseases: Secondary | ICD-10-CM | POA: Diagnosis not present

## 2018-01-05 DIAGNOSIS — I10 Essential (primary) hypertension: Secondary | ICD-10-CM | POA: Diagnosis not present

## 2018-01-05 DIAGNOSIS — R278 Other lack of coordination: Secondary | ICD-10-CM | POA: Diagnosis not present

## 2018-01-05 DIAGNOSIS — M170 Bilateral primary osteoarthritis of knee: Secondary | ICD-10-CM | POA: Diagnosis not present

## 2018-01-05 DIAGNOSIS — R531 Weakness: Secondary | ICD-10-CM | POA: Diagnosis not present

## 2018-01-06 DIAGNOSIS — R278 Other lack of coordination: Secondary | ICD-10-CM | POA: Diagnosis not present

## 2018-01-06 DIAGNOSIS — I7389 Other specified peripheral vascular diseases: Secondary | ICD-10-CM | POA: Diagnosis not present

## 2018-01-06 DIAGNOSIS — R531 Weakness: Secondary | ICD-10-CM | POA: Diagnosis not present

## 2018-01-06 DIAGNOSIS — R296 Repeated falls: Secondary | ICD-10-CM | POA: Diagnosis not present

## 2018-01-06 DIAGNOSIS — I10 Essential (primary) hypertension: Secondary | ICD-10-CM | POA: Diagnosis not present

## 2018-01-06 DIAGNOSIS — M170 Bilateral primary osteoarthritis of knee: Secondary | ICD-10-CM | POA: Diagnosis not present

## 2018-01-07 DIAGNOSIS — R296 Repeated falls: Secondary | ICD-10-CM | POA: Diagnosis not present

## 2018-01-07 DIAGNOSIS — I10 Essential (primary) hypertension: Secondary | ICD-10-CM | POA: Diagnosis not present

## 2018-01-07 DIAGNOSIS — M170 Bilateral primary osteoarthritis of knee: Secondary | ICD-10-CM | POA: Diagnosis not present

## 2018-01-07 DIAGNOSIS — R278 Other lack of coordination: Secondary | ICD-10-CM | POA: Diagnosis not present

## 2018-01-07 DIAGNOSIS — I7389 Other specified peripheral vascular diseases: Secondary | ICD-10-CM | POA: Diagnosis not present

## 2018-01-07 DIAGNOSIS — R531 Weakness: Secondary | ICD-10-CM | POA: Diagnosis not present

## 2018-01-08 DIAGNOSIS — R296 Repeated falls: Secondary | ICD-10-CM | POA: Diagnosis not present

## 2018-01-08 DIAGNOSIS — R531 Weakness: Secondary | ICD-10-CM | POA: Diagnosis not present

## 2018-01-08 DIAGNOSIS — M170 Bilateral primary osteoarthritis of knee: Secondary | ICD-10-CM | POA: Diagnosis not present

## 2018-01-08 DIAGNOSIS — I7389 Other specified peripheral vascular diseases: Secondary | ICD-10-CM | POA: Diagnosis not present

## 2018-01-08 DIAGNOSIS — R278 Other lack of coordination: Secondary | ICD-10-CM | POA: Diagnosis not present

## 2018-01-08 DIAGNOSIS — I10 Essential (primary) hypertension: Secondary | ICD-10-CM | POA: Diagnosis not present

## 2018-01-09 DIAGNOSIS — I10 Essential (primary) hypertension: Secondary | ICD-10-CM | POA: Diagnosis not present

## 2018-01-09 DIAGNOSIS — I7389 Other specified peripheral vascular diseases: Secondary | ICD-10-CM | POA: Diagnosis not present

## 2018-01-09 DIAGNOSIS — R296 Repeated falls: Secondary | ICD-10-CM | POA: Diagnosis not present

## 2018-01-09 DIAGNOSIS — R531 Weakness: Secondary | ICD-10-CM | POA: Diagnosis not present

## 2018-01-09 DIAGNOSIS — D649 Anemia, unspecified: Secondary | ICD-10-CM | POA: Diagnosis not present

## 2018-01-09 DIAGNOSIS — M170 Bilateral primary osteoarthritis of knee: Secondary | ICD-10-CM | POA: Diagnosis not present

## 2018-01-09 DIAGNOSIS — R278 Other lack of coordination: Secondary | ICD-10-CM | POA: Diagnosis not present

## 2018-01-12 DIAGNOSIS — M170 Bilateral primary osteoarthritis of knee: Secondary | ICD-10-CM | POA: Diagnosis not present

## 2018-01-12 DIAGNOSIS — R296 Repeated falls: Secondary | ICD-10-CM | POA: Diagnosis not present

## 2018-01-12 DIAGNOSIS — I7389 Other specified peripheral vascular diseases: Secondary | ICD-10-CM | POA: Diagnosis not present

## 2018-01-12 DIAGNOSIS — R531 Weakness: Secondary | ICD-10-CM | POA: Diagnosis not present

## 2018-01-12 DIAGNOSIS — I10 Essential (primary) hypertension: Secondary | ICD-10-CM | POA: Diagnosis not present

## 2018-01-12 DIAGNOSIS — R278 Other lack of coordination: Secondary | ICD-10-CM | POA: Diagnosis not present

## 2018-01-13 DIAGNOSIS — I7389 Other specified peripheral vascular diseases: Secondary | ICD-10-CM | POA: Diagnosis not present

## 2018-01-13 DIAGNOSIS — R296 Repeated falls: Secondary | ICD-10-CM | POA: Diagnosis not present

## 2018-01-13 DIAGNOSIS — R531 Weakness: Secondary | ICD-10-CM | POA: Diagnosis not present

## 2018-01-13 DIAGNOSIS — R278 Other lack of coordination: Secondary | ICD-10-CM | POA: Diagnosis not present

## 2018-01-13 DIAGNOSIS — I10 Essential (primary) hypertension: Secondary | ICD-10-CM | POA: Diagnosis not present

## 2018-01-13 DIAGNOSIS — M170 Bilateral primary osteoarthritis of knee: Secondary | ICD-10-CM | POA: Diagnosis not present

## 2018-01-14 DIAGNOSIS — M170 Bilateral primary osteoarthritis of knee: Secondary | ICD-10-CM | POA: Diagnosis not present

## 2018-01-14 DIAGNOSIS — R531 Weakness: Secondary | ICD-10-CM | POA: Diagnosis not present

## 2018-01-14 DIAGNOSIS — R278 Other lack of coordination: Secondary | ICD-10-CM | POA: Diagnosis not present

## 2018-01-14 DIAGNOSIS — R296 Repeated falls: Secondary | ICD-10-CM | POA: Diagnosis not present

## 2018-01-14 DIAGNOSIS — I7389 Other specified peripheral vascular diseases: Secondary | ICD-10-CM | POA: Diagnosis not present

## 2018-01-14 DIAGNOSIS — I10 Essential (primary) hypertension: Secondary | ICD-10-CM | POA: Diagnosis not present

## 2018-01-15 DIAGNOSIS — I5032 Chronic diastolic (congestive) heart failure: Secondary | ICD-10-CM | POA: Diagnosis not present

## 2018-01-15 DIAGNOSIS — R531 Weakness: Secondary | ICD-10-CM | POA: Diagnosis not present

## 2018-01-15 DIAGNOSIS — M170 Bilateral primary osteoarthritis of knee: Secondary | ICD-10-CM | POA: Diagnosis not present

## 2018-01-15 DIAGNOSIS — R278 Other lack of coordination: Secondary | ICD-10-CM | POA: Diagnosis not present

## 2018-01-15 DIAGNOSIS — I7389 Other specified peripheral vascular diseases: Secondary | ICD-10-CM | POA: Diagnosis not present

## 2018-01-15 DIAGNOSIS — I10 Essential (primary) hypertension: Secondary | ICD-10-CM | POA: Diagnosis not present

## 2018-01-15 DIAGNOSIS — H6123 Impacted cerumen, bilateral: Secondary | ICD-10-CM | POA: Diagnosis not present

## 2018-01-15 DIAGNOSIS — R296 Repeated falls: Secondary | ICD-10-CM | POA: Diagnosis not present

## 2018-01-16 DIAGNOSIS — I10 Essential (primary) hypertension: Secondary | ICD-10-CM | POA: Diagnosis not present

## 2018-01-16 DIAGNOSIS — R296 Repeated falls: Secondary | ICD-10-CM | POA: Diagnosis not present

## 2018-01-16 DIAGNOSIS — M170 Bilateral primary osteoarthritis of knee: Secondary | ICD-10-CM | POA: Diagnosis not present

## 2018-01-16 DIAGNOSIS — R278 Other lack of coordination: Secondary | ICD-10-CM | POA: Diagnosis not present

## 2018-01-16 DIAGNOSIS — I7389 Other specified peripheral vascular diseases: Secondary | ICD-10-CM | POA: Diagnosis not present

## 2018-01-16 DIAGNOSIS — R531 Weakness: Secondary | ICD-10-CM | POA: Diagnosis not present

## 2018-01-19 DIAGNOSIS — R531 Weakness: Secondary | ICD-10-CM | POA: Diagnosis not present

## 2018-01-19 DIAGNOSIS — I10 Essential (primary) hypertension: Secondary | ICD-10-CM | POA: Diagnosis not present

## 2018-01-19 DIAGNOSIS — R278 Other lack of coordination: Secondary | ICD-10-CM | POA: Diagnosis not present

## 2018-01-19 DIAGNOSIS — M170 Bilateral primary osteoarthritis of knee: Secondary | ICD-10-CM | POA: Diagnosis not present

## 2018-01-19 DIAGNOSIS — I7389 Other specified peripheral vascular diseases: Secondary | ICD-10-CM | POA: Diagnosis not present

## 2018-01-19 DIAGNOSIS — R296 Repeated falls: Secondary | ICD-10-CM | POA: Diagnosis not present

## 2018-01-20 DIAGNOSIS — R296 Repeated falls: Secondary | ICD-10-CM | POA: Diagnosis not present

## 2018-01-20 DIAGNOSIS — M170 Bilateral primary osteoarthritis of knee: Secondary | ICD-10-CM | POA: Diagnosis not present

## 2018-01-20 DIAGNOSIS — R531 Weakness: Secondary | ICD-10-CM | POA: Diagnosis not present

## 2018-01-21 DIAGNOSIS — B351 Tinea unguium: Secondary | ICD-10-CM | POA: Diagnosis not present

## 2018-01-21 DIAGNOSIS — M170 Bilateral primary osteoarthritis of knee: Secondary | ICD-10-CM | POA: Diagnosis not present

## 2018-01-21 DIAGNOSIS — R296 Repeated falls: Secondary | ICD-10-CM | POA: Diagnosis not present

## 2018-01-21 DIAGNOSIS — R531 Weakness: Secondary | ICD-10-CM | POA: Diagnosis not present

## 2018-01-21 DIAGNOSIS — M79675 Pain in left toe(s): Secondary | ICD-10-CM | POA: Diagnosis not present

## 2018-01-21 DIAGNOSIS — I739 Peripheral vascular disease, unspecified: Secondary | ICD-10-CM | POA: Diagnosis not present

## 2018-01-28 DIAGNOSIS — Z23 Encounter for immunization: Secondary | ICD-10-CM | POA: Diagnosis not present

## 2018-02-05 DIAGNOSIS — L853 Xerosis cutis: Secondary | ICD-10-CM | POA: Diagnosis not present

## 2018-02-12 DIAGNOSIS — I10 Essential (primary) hypertension: Secondary | ICD-10-CM | POA: Diagnosis not present

## 2018-02-12 DIAGNOSIS — K219 Gastro-esophageal reflux disease without esophagitis: Secondary | ICD-10-CM | POA: Diagnosis not present

## 2018-02-12 DIAGNOSIS — I5032 Chronic diastolic (congestive) heart failure: Secondary | ICD-10-CM | POA: Diagnosis not present

## 2018-02-12 DIAGNOSIS — M170 Bilateral primary osteoarthritis of knee: Secondary | ICD-10-CM | POA: Diagnosis not present

## 2018-02-20 DIAGNOSIS — H9313 Tinnitus, bilateral: Secondary | ICD-10-CM | POA: Diagnosis not present

## 2018-02-20 DIAGNOSIS — J45909 Unspecified asthma, uncomplicated: Secondary | ICD-10-CM | POA: Diagnosis not present

## 2018-02-20 DIAGNOSIS — M170 Bilateral primary osteoarthritis of knee: Secondary | ICD-10-CM | POA: Diagnosis not present

## 2018-02-20 DIAGNOSIS — I10 Essential (primary) hypertension: Secondary | ICD-10-CM | POA: Diagnosis not present

## 2018-03-09 DIAGNOSIS — M170 Bilateral primary osteoarthritis of knee: Secondary | ICD-10-CM | POA: Diagnosis not present

## 2018-03-09 DIAGNOSIS — I10 Essential (primary) hypertension: Secondary | ICD-10-CM | POA: Diagnosis not present

## 2018-03-09 DIAGNOSIS — I5032 Chronic diastolic (congestive) heart failure: Secondary | ICD-10-CM | POA: Diagnosis not present

## 2018-03-09 DIAGNOSIS — J45909 Unspecified asthma, uncomplicated: Secondary | ICD-10-CM | POA: Diagnosis not present

## 2018-03-25 DIAGNOSIS — M19041 Primary osteoarthritis, right hand: Secondary | ICD-10-CM | POA: Diagnosis not present

## 2018-03-25 DIAGNOSIS — I10 Essential (primary) hypertension: Secondary | ICD-10-CM | POA: Diagnosis not present

## 2018-03-25 DIAGNOSIS — I5032 Chronic diastolic (congestive) heart failure: Secondary | ICD-10-CM | POA: Diagnosis not present

## 2018-03-25 DIAGNOSIS — M170 Bilateral primary osteoarthritis of knee: Secondary | ICD-10-CM | POA: Diagnosis not present

## 2018-04-06 DIAGNOSIS — H25813 Combined forms of age-related cataract, bilateral: Secondary | ICD-10-CM | POA: Diagnosis not present

## 2018-04-06 DIAGNOSIS — M170 Bilateral primary osteoarthritis of knee: Secondary | ICD-10-CM | POA: Diagnosis not present

## 2018-04-06 DIAGNOSIS — I5032 Chronic diastolic (congestive) heart failure: Secondary | ICD-10-CM | POA: Diagnosis not present

## 2018-04-06 DIAGNOSIS — K219 Gastro-esophageal reflux disease without esophagitis: Secondary | ICD-10-CM | POA: Diagnosis not present

## 2018-04-06 DIAGNOSIS — H04123 Dry eye syndrome of bilateral lacrimal glands: Secondary | ICD-10-CM | POA: Diagnosis not present

## 2018-04-06 DIAGNOSIS — I10 Essential (primary) hypertension: Secondary | ICD-10-CM | POA: Diagnosis not present

## 2018-04-06 DIAGNOSIS — Z7951 Long term (current) use of inhaled steroids: Secondary | ICD-10-CM | POA: Diagnosis not present

## 2018-04-10 DIAGNOSIS — Z5181 Encounter for therapeutic drug level monitoring: Secondary | ICD-10-CM | POA: Diagnosis not present

## 2018-04-10 DIAGNOSIS — I10 Essential (primary) hypertension: Secondary | ICD-10-CM | POA: Diagnosis not present

## 2018-04-10 DIAGNOSIS — D649 Anemia, unspecified: Secondary | ICD-10-CM | POA: Diagnosis not present

## 2018-04-11 DIAGNOSIS — R918 Other nonspecific abnormal finding of lung field: Secondary | ICD-10-CM | POA: Diagnosis not present

## 2018-04-11 DIAGNOSIS — I517 Cardiomegaly: Secondary | ICD-10-CM | POA: Diagnosis not present

## 2018-04-20 DIAGNOSIS — R110 Nausea: Secondary | ICD-10-CM | POA: Diagnosis not present

## 2018-04-20 DIAGNOSIS — R51 Headache: Secondary | ICD-10-CM | POA: Diagnosis not present

## 2018-04-23 DIAGNOSIS — M170 Bilateral primary osteoarthritis of knee: Secondary | ICD-10-CM | POA: Diagnosis not present

## 2018-04-23 DIAGNOSIS — Z6841 Body Mass Index (BMI) 40.0 and over, adult: Secondary | ICD-10-CM | POA: Diagnosis not present

## 2018-04-27 DIAGNOSIS — M170 Bilateral primary osteoarthritis of knee: Secondary | ICD-10-CM | POA: Diagnosis not present

## 2018-04-27 DIAGNOSIS — R296 Repeated falls: Secondary | ICD-10-CM | POA: Diagnosis not present

## 2018-04-27 DIAGNOSIS — R531 Weakness: Secondary | ICD-10-CM | POA: Diagnosis not present

## 2018-04-28 DIAGNOSIS — R296 Repeated falls: Secondary | ICD-10-CM | POA: Diagnosis not present

## 2018-04-28 DIAGNOSIS — M170 Bilateral primary osteoarthritis of knee: Secondary | ICD-10-CM | POA: Diagnosis not present

## 2018-04-28 DIAGNOSIS — R531 Weakness: Secondary | ICD-10-CM | POA: Diagnosis not present

## 2018-04-29 DIAGNOSIS — R531 Weakness: Secondary | ICD-10-CM | POA: Diagnosis not present

## 2018-04-29 DIAGNOSIS — R296 Repeated falls: Secondary | ICD-10-CM | POA: Diagnosis not present

## 2018-04-29 DIAGNOSIS — M170 Bilateral primary osteoarthritis of knee: Secondary | ICD-10-CM | POA: Diagnosis not present

## 2018-04-30 DIAGNOSIS — R531 Weakness: Secondary | ICD-10-CM | POA: Diagnosis not present

## 2018-04-30 DIAGNOSIS — R296 Repeated falls: Secondary | ICD-10-CM | POA: Diagnosis not present

## 2018-04-30 DIAGNOSIS — M170 Bilateral primary osteoarthritis of knee: Secondary | ICD-10-CM | POA: Diagnosis not present

## 2018-05-04 DIAGNOSIS — R296 Repeated falls: Secondary | ICD-10-CM | POA: Diagnosis not present

## 2018-05-04 DIAGNOSIS — M170 Bilateral primary osteoarthritis of knee: Secondary | ICD-10-CM | POA: Diagnosis not present

## 2018-05-04 DIAGNOSIS — R531 Weakness: Secondary | ICD-10-CM | POA: Diagnosis not present

## 2018-05-05 DIAGNOSIS — M170 Bilateral primary osteoarthritis of knee: Secondary | ICD-10-CM | POA: Diagnosis not present

## 2018-05-05 DIAGNOSIS — R531 Weakness: Secondary | ICD-10-CM | POA: Diagnosis not present

## 2018-05-05 DIAGNOSIS — R296 Repeated falls: Secondary | ICD-10-CM | POA: Diagnosis not present

## 2018-05-06 DIAGNOSIS — R296 Repeated falls: Secondary | ICD-10-CM | POA: Diagnosis not present

## 2018-05-06 DIAGNOSIS — I5032 Chronic diastolic (congestive) heart failure: Secondary | ICD-10-CM | POA: Diagnosis not present

## 2018-05-06 DIAGNOSIS — M170 Bilateral primary osteoarthritis of knee: Secondary | ICD-10-CM | POA: Diagnosis not present

## 2018-05-06 DIAGNOSIS — R531 Weakness: Secondary | ICD-10-CM | POA: Diagnosis not present

## 2018-05-06 DIAGNOSIS — I10 Essential (primary) hypertension: Secondary | ICD-10-CM | POA: Diagnosis not present

## 2018-05-06 DIAGNOSIS — K219 Gastro-esophageal reflux disease without esophagitis: Secondary | ICD-10-CM | POA: Diagnosis not present

## 2018-05-07 DIAGNOSIS — M170 Bilateral primary osteoarthritis of knee: Secondary | ICD-10-CM | POA: Diagnosis not present

## 2018-05-07 DIAGNOSIS — R296 Repeated falls: Secondary | ICD-10-CM | POA: Diagnosis not present

## 2018-05-07 DIAGNOSIS — R531 Weakness: Secondary | ICD-10-CM | POA: Diagnosis not present

## 2018-05-08 DIAGNOSIS — R296 Repeated falls: Secondary | ICD-10-CM | POA: Diagnosis not present

## 2018-05-08 DIAGNOSIS — R531 Weakness: Secondary | ICD-10-CM | POA: Diagnosis not present

## 2018-05-08 DIAGNOSIS — M170 Bilateral primary osteoarthritis of knee: Secondary | ICD-10-CM | POA: Diagnosis not present

## 2018-05-11 DIAGNOSIS — R296 Repeated falls: Secondary | ICD-10-CM | POA: Diagnosis not present

## 2018-05-11 DIAGNOSIS — R531 Weakness: Secondary | ICD-10-CM | POA: Diagnosis not present

## 2018-05-11 DIAGNOSIS — M170 Bilateral primary osteoarthritis of knee: Secondary | ICD-10-CM | POA: Diagnosis not present

## 2018-05-12 DIAGNOSIS — M170 Bilateral primary osteoarthritis of knee: Secondary | ICD-10-CM | POA: Diagnosis not present

## 2018-05-12 DIAGNOSIS — R531 Weakness: Secondary | ICD-10-CM | POA: Diagnosis not present

## 2018-05-12 DIAGNOSIS — R296 Repeated falls: Secondary | ICD-10-CM | POA: Diagnosis not present

## 2018-05-13 DIAGNOSIS — M170 Bilateral primary osteoarthritis of knee: Secondary | ICD-10-CM | POA: Diagnosis not present

## 2018-05-13 DIAGNOSIS — R296 Repeated falls: Secondary | ICD-10-CM | POA: Diagnosis not present

## 2018-05-13 DIAGNOSIS — R531 Weakness: Secondary | ICD-10-CM | POA: Diagnosis not present

## 2018-05-14 DIAGNOSIS — M170 Bilateral primary osteoarthritis of knee: Secondary | ICD-10-CM | POA: Diagnosis not present

## 2018-05-14 DIAGNOSIS — R296 Repeated falls: Secondary | ICD-10-CM | POA: Diagnosis not present

## 2018-05-14 DIAGNOSIS — R531 Weakness: Secondary | ICD-10-CM | POA: Diagnosis not present

## 2018-05-15 DIAGNOSIS — R531 Weakness: Secondary | ICD-10-CM | POA: Diagnosis not present

## 2018-05-15 DIAGNOSIS — M170 Bilateral primary osteoarthritis of knee: Secondary | ICD-10-CM | POA: Diagnosis not present

## 2018-05-15 DIAGNOSIS — R296 Repeated falls: Secondary | ICD-10-CM | POA: Diagnosis not present

## 2018-05-18 DIAGNOSIS — R531 Weakness: Secondary | ICD-10-CM | POA: Diagnosis not present

## 2018-05-18 DIAGNOSIS — R296 Repeated falls: Secondary | ICD-10-CM | POA: Diagnosis not present

## 2018-05-18 DIAGNOSIS — M170 Bilateral primary osteoarthritis of knee: Secondary | ICD-10-CM | POA: Diagnosis not present

## 2018-05-19 DIAGNOSIS — R296 Repeated falls: Secondary | ICD-10-CM | POA: Diagnosis not present

## 2018-05-19 DIAGNOSIS — M170 Bilateral primary osteoarthritis of knee: Secondary | ICD-10-CM | POA: Diagnosis not present

## 2018-05-19 DIAGNOSIS — R531 Weakness: Secondary | ICD-10-CM | POA: Diagnosis not present

## 2018-05-20 DIAGNOSIS — R296 Repeated falls: Secondary | ICD-10-CM | POA: Diagnosis not present

## 2018-05-20 DIAGNOSIS — M170 Bilateral primary osteoarthritis of knee: Secondary | ICD-10-CM | POA: Diagnosis not present

## 2018-05-20 DIAGNOSIS — M79675 Pain in left toe(s): Secondary | ICD-10-CM | POA: Diagnosis not present

## 2018-05-20 DIAGNOSIS — B351 Tinea unguium: Secondary | ICD-10-CM | POA: Diagnosis not present

## 2018-05-20 DIAGNOSIS — R531 Weakness: Secondary | ICD-10-CM | POA: Diagnosis not present

## 2018-05-20 DIAGNOSIS — I739 Peripheral vascular disease, unspecified: Secondary | ICD-10-CM | POA: Diagnosis not present

## 2018-05-21 DIAGNOSIS — M170 Bilateral primary osteoarthritis of knee: Secondary | ICD-10-CM | POA: Diagnosis not present

## 2018-05-21 DIAGNOSIS — R296 Repeated falls: Secondary | ICD-10-CM | POA: Diagnosis not present

## 2018-05-21 DIAGNOSIS — R531 Weakness: Secondary | ICD-10-CM | POA: Diagnosis not present

## 2018-05-25 DIAGNOSIS — R296 Repeated falls: Secondary | ICD-10-CM | POA: Diagnosis not present

## 2018-05-25 DIAGNOSIS — R531 Weakness: Secondary | ICD-10-CM | POA: Diagnosis not present

## 2018-05-25 DIAGNOSIS — M170 Bilateral primary osteoarthritis of knee: Secondary | ICD-10-CM | POA: Diagnosis not present

## 2018-05-27 DIAGNOSIS — R531 Weakness: Secondary | ICD-10-CM | POA: Diagnosis not present

## 2018-05-27 DIAGNOSIS — R296 Repeated falls: Secondary | ICD-10-CM | POA: Diagnosis not present

## 2018-05-27 DIAGNOSIS — M170 Bilateral primary osteoarthritis of knee: Secondary | ICD-10-CM | POA: Diagnosis not present

## 2018-05-28 DIAGNOSIS — R531 Weakness: Secondary | ICD-10-CM | POA: Diagnosis not present

## 2018-05-28 DIAGNOSIS — M170 Bilateral primary osteoarthritis of knee: Secondary | ICD-10-CM | POA: Diagnosis not present

## 2018-05-28 DIAGNOSIS — R296 Repeated falls: Secondary | ICD-10-CM | POA: Diagnosis not present

## 2018-05-29 DIAGNOSIS — R531 Weakness: Secondary | ICD-10-CM | POA: Diagnosis not present

## 2018-05-29 DIAGNOSIS — M170 Bilateral primary osteoarthritis of knee: Secondary | ICD-10-CM | POA: Diagnosis not present

## 2018-05-29 DIAGNOSIS — R296 Repeated falls: Secondary | ICD-10-CM | POA: Diagnosis not present

## 2018-06-01 DIAGNOSIS — M170 Bilateral primary osteoarthritis of knee: Secondary | ICD-10-CM | POA: Diagnosis not present

## 2018-06-01 DIAGNOSIS — R296 Repeated falls: Secondary | ICD-10-CM | POA: Diagnosis not present

## 2018-06-01 DIAGNOSIS — R531 Weakness: Secondary | ICD-10-CM | POA: Diagnosis not present

## 2018-06-02 DIAGNOSIS — R296 Repeated falls: Secondary | ICD-10-CM | POA: Diagnosis not present

## 2018-06-02 DIAGNOSIS — R531 Weakness: Secondary | ICD-10-CM | POA: Diagnosis not present

## 2018-06-02 DIAGNOSIS — M170 Bilateral primary osteoarthritis of knee: Secondary | ICD-10-CM | POA: Diagnosis not present

## 2018-06-03 DIAGNOSIS — R296 Repeated falls: Secondary | ICD-10-CM | POA: Diagnosis not present

## 2018-06-03 DIAGNOSIS — M170 Bilateral primary osteoarthritis of knee: Secondary | ICD-10-CM | POA: Diagnosis not present

## 2018-06-03 DIAGNOSIS — R531 Weakness: Secondary | ICD-10-CM | POA: Diagnosis not present

## 2018-06-04 DIAGNOSIS — R296 Repeated falls: Secondary | ICD-10-CM | POA: Diagnosis not present

## 2018-06-04 DIAGNOSIS — R531 Weakness: Secondary | ICD-10-CM | POA: Diagnosis not present

## 2018-06-04 DIAGNOSIS — M170 Bilateral primary osteoarthritis of knee: Secondary | ICD-10-CM | POA: Diagnosis not present

## 2018-06-05 DIAGNOSIS — R531 Weakness: Secondary | ICD-10-CM | POA: Diagnosis not present

## 2018-06-05 DIAGNOSIS — R296 Repeated falls: Secondary | ICD-10-CM | POA: Diagnosis not present

## 2018-06-05 DIAGNOSIS — M170 Bilateral primary osteoarthritis of knee: Secondary | ICD-10-CM | POA: Diagnosis not present

## 2018-06-08 DIAGNOSIS — R296 Repeated falls: Secondary | ICD-10-CM | POA: Diagnosis not present

## 2018-06-08 DIAGNOSIS — M170 Bilateral primary osteoarthritis of knee: Secondary | ICD-10-CM | POA: Diagnosis not present

## 2018-06-08 DIAGNOSIS — R531 Weakness: Secondary | ICD-10-CM | POA: Diagnosis not present

## 2018-06-09 DIAGNOSIS — R296 Repeated falls: Secondary | ICD-10-CM | POA: Diagnosis not present

## 2018-06-09 DIAGNOSIS — R531 Weakness: Secondary | ICD-10-CM | POA: Diagnosis not present

## 2018-06-09 DIAGNOSIS — M170 Bilateral primary osteoarthritis of knee: Secondary | ICD-10-CM | POA: Diagnosis not present

## 2018-06-10 DIAGNOSIS — M170 Bilateral primary osteoarthritis of knee: Secondary | ICD-10-CM | POA: Diagnosis not present

## 2018-06-10 DIAGNOSIS — R531 Weakness: Secondary | ICD-10-CM | POA: Diagnosis not present

## 2018-06-10 DIAGNOSIS — R296 Repeated falls: Secondary | ICD-10-CM | POA: Diagnosis not present

## 2018-06-11 DIAGNOSIS — R296 Repeated falls: Secondary | ICD-10-CM | POA: Diagnosis not present

## 2018-06-11 DIAGNOSIS — M170 Bilateral primary osteoarthritis of knee: Secondary | ICD-10-CM | POA: Diagnosis not present

## 2018-06-11 DIAGNOSIS — R531 Weakness: Secondary | ICD-10-CM | POA: Diagnosis not present

## 2018-06-12 DIAGNOSIS — R296 Repeated falls: Secondary | ICD-10-CM | POA: Diagnosis not present

## 2018-06-12 DIAGNOSIS — R531 Weakness: Secondary | ICD-10-CM | POA: Diagnosis not present

## 2018-06-12 DIAGNOSIS — M170 Bilateral primary osteoarthritis of knee: Secondary | ICD-10-CM | POA: Diagnosis not present

## 2018-06-15 DIAGNOSIS — M170 Bilateral primary osteoarthritis of knee: Secondary | ICD-10-CM | POA: Diagnosis not present

## 2018-06-15 DIAGNOSIS — R296 Repeated falls: Secondary | ICD-10-CM | POA: Diagnosis not present

## 2018-06-15 DIAGNOSIS — R531 Weakness: Secondary | ICD-10-CM | POA: Diagnosis not present

## 2018-06-16 DIAGNOSIS — R296 Repeated falls: Secondary | ICD-10-CM | POA: Diagnosis not present

## 2018-06-16 DIAGNOSIS — M170 Bilateral primary osteoarthritis of knee: Secondary | ICD-10-CM | POA: Diagnosis not present

## 2018-06-16 DIAGNOSIS — R531 Weakness: Secondary | ICD-10-CM | POA: Diagnosis not present

## 2018-06-17 DIAGNOSIS — M170 Bilateral primary osteoarthritis of knee: Secondary | ICD-10-CM | POA: Diagnosis not present

## 2018-06-17 DIAGNOSIS — I5032 Chronic diastolic (congestive) heart failure: Secondary | ICD-10-CM | POA: Diagnosis not present

## 2018-06-17 DIAGNOSIS — R531 Weakness: Secondary | ICD-10-CM | POA: Diagnosis not present

## 2018-06-17 DIAGNOSIS — I10 Essential (primary) hypertension: Secondary | ICD-10-CM | POA: Diagnosis not present

## 2018-06-17 DIAGNOSIS — K219 Gastro-esophageal reflux disease without esophagitis: Secondary | ICD-10-CM | POA: Diagnosis not present

## 2018-06-17 DIAGNOSIS — R42 Dizziness and giddiness: Secondary | ICD-10-CM | POA: Diagnosis not present

## 2018-06-17 DIAGNOSIS — R296 Repeated falls: Secondary | ICD-10-CM | POA: Diagnosis not present

## 2018-06-18 DIAGNOSIS — M170 Bilateral primary osteoarthritis of knee: Secondary | ICD-10-CM | POA: Diagnosis not present

## 2018-06-18 DIAGNOSIS — R531 Weakness: Secondary | ICD-10-CM | POA: Diagnosis not present

## 2018-06-18 DIAGNOSIS — R296 Repeated falls: Secondary | ICD-10-CM | POA: Diagnosis not present

## 2018-06-22 DIAGNOSIS — R296 Repeated falls: Secondary | ICD-10-CM | POA: Diagnosis not present

## 2018-06-22 DIAGNOSIS — M170 Bilateral primary osteoarthritis of knee: Secondary | ICD-10-CM | POA: Diagnosis not present

## 2018-06-22 DIAGNOSIS — R531 Weakness: Secondary | ICD-10-CM | POA: Diagnosis not present

## 2018-06-23 DIAGNOSIS — M170 Bilateral primary osteoarthritis of knee: Secondary | ICD-10-CM | POA: Diagnosis not present

## 2018-06-23 DIAGNOSIS — R531 Weakness: Secondary | ICD-10-CM | POA: Diagnosis not present

## 2018-06-23 DIAGNOSIS — R296 Repeated falls: Secondary | ICD-10-CM | POA: Diagnosis not present

## 2018-06-24 DIAGNOSIS — M170 Bilateral primary osteoarthritis of knee: Secondary | ICD-10-CM | POA: Diagnosis not present

## 2018-06-24 DIAGNOSIS — R531 Weakness: Secondary | ICD-10-CM | POA: Diagnosis not present

## 2018-06-24 DIAGNOSIS — R296 Repeated falls: Secondary | ICD-10-CM | POA: Diagnosis not present

## 2018-06-26 DIAGNOSIS — M170 Bilateral primary osteoarthritis of knee: Secondary | ICD-10-CM | POA: Diagnosis not present

## 2018-06-26 DIAGNOSIS — R531 Weakness: Secondary | ICD-10-CM | POA: Diagnosis not present

## 2018-06-26 DIAGNOSIS — R296 Repeated falls: Secondary | ICD-10-CM | POA: Diagnosis not present

## 2018-06-29 DIAGNOSIS — R531 Weakness: Secondary | ICD-10-CM | POA: Diagnosis not present

## 2018-06-29 DIAGNOSIS — R296 Repeated falls: Secondary | ICD-10-CM | POA: Diagnosis not present

## 2018-06-29 DIAGNOSIS — M170 Bilateral primary osteoarthritis of knee: Secondary | ICD-10-CM | POA: Diagnosis not present

## 2018-06-30 DIAGNOSIS — R531 Weakness: Secondary | ICD-10-CM | POA: Diagnosis not present

## 2018-06-30 DIAGNOSIS — M170 Bilateral primary osteoarthritis of knee: Secondary | ICD-10-CM | POA: Diagnosis not present

## 2018-06-30 DIAGNOSIS — R296 Repeated falls: Secondary | ICD-10-CM | POA: Diagnosis not present

## 2018-07-01 DIAGNOSIS — M170 Bilateral primary osteoarthritis of knee: Secondary | ICD-10-CM | POA: Diagnosis not present

## 2018-07-01 DIAGNOSIS — R531 Weakness: Secondary | ICD-10-CM | POA: Diagnosis not present

## 2018-07-01 DIAGNOSIS — R296 Repeated falls: Secondary | ICD-10-CM | POA: Diagnosis not present

## 2018-07-02 DIAGNOSIS — M170 Bilateral primary osteoarthritis of knee: Secondary | ICD-10-CM | POA: Diagnosis not present

## 2018-07-02 DIAGNOSIS — R296 Repeated falls: Secondary | ICD-10-CM | POA: Diagnosis not present

## 2018-07-02 DIAGNOSIS — R531 Weakness: Secondary | ICD-10-CM | POA: Diagnosis not present

## 2018-07-03 DIAGNOSIS — M170 Bilateral primary osteoarthritis of knee: Secondary | ICD-10-CM | POA: Diagnosis not present

## 2018-07-03 DIAGNOSIS — R296 Repeated falls: Secondary | ICD-10-CM | POA: Diagnosis not present

## 2018-07-03 DIAGNOSIS — R531 Weakness: Secondary | ICD-10-CM | POA: Diagnosis not present

## 2018-07-06 DIAGNOSIS — R531 Weakness: Secondary | ICD-10-CM | POA: Diagnosis not present

## 2018-07-06 DIAGNOSIS — R296 Repeated falls: Secondary | ICD-10-CM | POA: Diagnosis not present

## 2018-07-06 DIAGNOSIS — M170 Bilateral primary osteoarthritis of knee: Secondary | ICD-10-CM | POA: Diagnosis not present

## 2018-07-07 DIAGNOSIS — M170 Bilateral primary osteoarthritis of knee: Secondary | ICD-10-CM | POA: Diagnosis not present

## 2018-07-07 DIAGNOSIS — R531 Weakness: Secondary | ICD-10-CM | POA: Diagnosis not present

## 2018-07-07 DIAGNOSIS — R296 Repeated falls: Secondary | ICD-10-CM | POA: Diagnosis not present

## 2018-07-08 DIAGNOSIS — R531 Weakness: Secondary | ICD-10-CM | POA: Diagnosis not present

## 2018-07-08 DIAGNOSIS — R296 Repeated falls: Secondary | ICD-10-CM | POA: Diagnosis not present

## 2018-07-08 DIAGNOSIS — M170 Bilateral primary osteoarthritis of knee: Secondary | ICD-10-CM | POA: Diagnosis not present

## 2018-07-09 DIAGNOSIS — R531 Weakness: Secondary | ICD-10-CM | POA: Diagnosis not present

## 2018-07-09 DIAGNOSIS — M170 Bilateral primary osteoarthritis of knee: Secondary | ICD-10-CM | POA: Diagnosis not present

## 2018-07-09 DIAGNOSIS — R296 Repeated falls: Secondary | ICD-10-CM | POA: Diagnosis not present

## 2018-07-10 DIAGNOSIS — M170 Bilateral primary osteoarthritis of knee: Secondary | ICD-10-CM | POA: Diagnosis not present

## 2018-07-10 DIAGNOSIS — R531 Weakness: Secondary | ICD-10-CM | POA: Diagnosis not present

## 2018-07-10 DIAGNOSIS — R296 Repeated falls: Secondary | ICD-10-CM | POA: Diagnosis not present

## 2018-07-15 DIAGNOSIS — K219 Gastro-esophageal reflux disease without esophagitis: Secondary | ICD-10-CM | POA: Diagnosis not present

## 2018-07-15 DIAGNOSIS — I10 Essential (primary) hypertension: Secondary | ICD-10-CM | POA: Diagnosis not present

## 2018-07-15 DIAGNOSIS — F419 Anxiety disorder, unspecified: Secondary | ICD-10-CM | POA: Diagnosis not present

## 2018-07-15 DIAGNOSIS — I5032 Chronic diastolic (congestive) heart failure: Secondary | ICD-10-CM | POA: Diagnosis not present

## 2018-07-20 DIAGNOSIS — I10 Essential (primary) hypertension: Secondary | ICD-10-CM | POA: Diagnosis not present

## 2018-07-20 DIAGNOSIS — E559 Vitamin D deficiency, unspecified: Secondary | ICD-10-CM | POA: Diagnosis not present

## 2018-07-20 DIAGNOSIS — D649 Anemia, unspecified: Secondary | ICD-10-CM | POA: Diagnosis not present

## 2018-07-24 DIAGNOSIS — R0902 Hypoxemia: Secondary | ICD-10-CM | POA: Diagnosis not present

## 2018-07-24 DIAGNOSIS — Z7401 Bed confinement status: Secondary | ICD-10-CM | POA: Diagnosis not present

## 2018-07-25 DIAGNOSIS — I739 Peripheral vascular disease, unspecified: Secondary | ICD-10-CM | POA: Diagnosis not present

## 2018-07-25 DIAGNOSIS — I5032 Chronic diastolic (congestive) heart failure: Secondary | ICD-10-CM | POA: Diagnosis not present

## 2018-07-25 DIAGNOSIS — K219 Gastro-esophageal reflux disease without esophagitis: Secondary | ICD-10-CM | POA: Diagnosis not present

## 2018-07-25 DIAGNOSIS — M19041 Primary osteoarthritis, right hand: Secondary | ICD-10-CM | POA: Diagnosis not present

## 2018-07-25 DIAGNOSIS — E669 Obesity, unspecified: Secondary | ICD-10-CM | POA: Diagnosis not present

## 2018-07-25 DIAGNOSIS — F339 Major depressive disorder, recurrent, unspecified: Secondary | ICD-10-CM | POA: Diagnosis not present

## 2018-07-25 DIAGNOSIS — F39 Unspecified mood [affective] disorder: Secondary | ICD-10-CM | POA: Diagnosis not present

## 2018-07-25 DIAGNOSIS — M170 Bilateral primary osteoarthritis of knee: Secondary | ICD-10-CM | POA: Diagnosis not present

## 2018-07-25 DIAGNOSIS — D519 Vitamin B12 deficiency anemia, unspecified: Secondary | ICD-10-CM | POA: Diagnosis not present

## 2018-07-25 DIAGNOSIS — J45909 Unspecified asthma, uncomplicated: Secondary | ICD-10-CM | POA: Diagnosis not present

## 2018-07-25 DIAGNOSIS — I69354 Hemiplegia and hemiparesis following cerebral infarction affecting left non-dominant side: Secondary | ICD-10-CM | POA: Diagnosis not present

## 2018-07-25 DIAGNOSIS — I110 Hypertensive heart disease with heart failure: Secondary | ICD-10-CM | POA: Diagnosis not present

## 2018-07-25 DIAGNOSIS — Z6841 Body Mass Index (BMI) 40.0 and over, adult: Secondary | ICD-10-CM | POA: Diagnosis not present

## 2018-07-25 DIAGNOSIS — E039 Hypothyroidism, unspecified: Secondary | ICD-10-CM | POA: Diagnosis not present

## 2018-07-25 DIAGNOSIS — M24542 Contracture, left hand: Secondary | ICD-10-CM | POA: Diagnosis not present

## 2018-07-25 DIAGNOSIS — F419 Anxiety disorder, unspecified: Secondary | ICD-10-CM | POA: Diagnosis not present

## 2018-07-27 DIAGNOSIS — M24542 Contracture, left hand: Secondary | ICD-10-CM | POA: Diagnosis not present

## 2018-07-27 DIAGNOSIS — I110 Hypertensive heart disease with heart failure: Secondary | ICD-10-CM | POA: Diagnosis not present

## 2018-07-27 DIAGNOSIS — M19041 Primary osteoarthritis, right hand: Secondary | ICD-10-CM | POA: Diagnosis not present

## 2018-07-27 DIAGNOSIS — I69354 Hemiplegia and hemiparesis following cerebral infarction affecting left non-dominant side: Secondary | ICD-10-CM | POA: Diagnosis not present

## 2018-07-27 DIAGNOSIS — M170 Bilateral primary osteoarthritis of knee: Secondary | ICD-10-CM | POA: Diagnosis not present

## 2018-07-27 DIAGNOSIS — I5032 Chronic diastolic (congestive) heart failure: Secondary | ICD-10-CM | POA: Diagnosis not present

## 2018-07-30 DIAGNOSIS — M19041 Primary osteoarthritis, right hand: Secondary | ICD-10-CM | POA: Diagnosis not present

## 2018-07-30 DIAGNOSIS — M24542 Contracture, left hand: Secondary | ICD-10-CM | POA: Diagnosis not present

## 2018-07-30 DIAGNOSIS — M170 Bilateral primary osteoarthritis of knee: Secondary | ICD-10-CM | POA: Diagnosis not present

## 2018-07-30 DIAGNOSIS — I5032 Chronic diastolic (congestive) heart failure: Secondary | ICD-10-CM | POA: Diagnosis not present

## 2018-07-30 DIAGNOSIS — I110 Hypertensive heart disease with heart failure: Secondary | ICD-10-CM | POA: Diagnosis not present

## 2018-07-30 DIAGNOSIS — I69354 Hemiplegia and hemiparesis following cerebral infarction affecting left non-dominant side: Secondary | ICD-10-CM | POA: Diagnosis not present

## 2018-08-05 DIAGNOSIS — I110 Hypertensive heart disease with heart failure: Secondary | ICD-10-CM | POA: Diagnosis not present

## 2018-08-05 DIAGNOSIS — M170 Bilateral primary osteoarthritis of knee: Secondary | ICD-10-CM | POA: Diagnosis not present

## 2018-08-05 DIAGNOSIS — I69354 Hemiplegia and hemiparesis following cerebral infarction affecting left non-dominant side: Secondary | ICD-10-CM | POA: Diagnosis not present

## 2018-08-05 DIAGNOSIS — M24542 Contracture, left hand: Secondary | ICD-10-CM | POA: Diagnosis not present

## 2018-08-05 DIAGNOSIS — I5032 Chronic diastolic (congestive) heart failure: Secondary | ICD-10-CM | POA: Diagnosis not present

## 2018-08-05 DIAGNOSIS — M19041 Primary osteoarthritis, right hand: Secondary | ICD-10-CM | POA: Diagnosis not present

## 2018-08-06 DIAGNOSIS — M170 Bilateral primary osteoarthritis of knee: Secondary | ICD-10-CM | POA: Diagnosis not present

## 2018-08-06 DIAGNOSIS — I5032 Chronic diastolic (congestive) heart failure: Secondary | ICD-10-CM | POA: Diagnosis not present

## 2018-08-06 DIAGNOSIS — I69354 Hemiplegia and hemiparesis following cerebral infarction affecting left non-dominant side: Secondary | ICD-10-CM | POA: Diagnosis not present

## 2018-08-06 DIAGNOSIS — M24542 Contracture, left hand: Secondary | ICD-10-CM | POA: Diagnosis not present

## 2018-08-06 DIAGNOSIS — I110 Hypertensive heart disease with heart failure: Secondary | ICD-10-CM | POA: Diagnosis not present

## 2018-08-06 DIAGNOSIS — M19041 Primary osteoarthritis, right hand: Secondary | ICD-10-CM | POA: Diagnosis not present

## 2018-08-07 DIAGNOSIS — M24542 Contracture, left hand: Secondary | ICD-10-CM | POA: Diagnosis not present

## 2018-08-07 DIAGNOSIS — I5032 Chronic diastolic (congestive) heart failure: Secondary | ICD-10-CM | POA: Diagnosis not present

## 2018-08-07 DIAGNOSIS — I110 Hypertensive heart disease with heart failure: Secondary | ICD-10-CM | POA: Diagnosis not present

## 2018-08-07 DIAGNOSIS — I69354 Hemiplegia and hemiparesis following cerebral infarction affecting left non-dominant side: Secondary | ICD-10-CM | POA: Diagnosis not present

## 2018-08-07 DIAGNOSIS — M19041 Primary osteoarthritis, right hand: Secondary | ICD-10-CM | POA: Diagnosis not present

## 2018-08-07 DIAGNOSIS — M170 Bilateral primary osteoarthritis of knee: Secondary | ICD-10-CM | POA: Diagnosis not present

## 2018-08-10 DIAGNOSIS — I110 Hypertensive heart disease with heart failure: Secondary | ICD-10-CM | POA: Diagnosis not present

## 2018-08-10 DIAGNOSIS — I5032 Chronic diastolic (congestive) heart failure: Secondary | ICD-10-CM | POA: Diagnosis not present

## 2018-08-10 DIAGNOSIS — I69354 Hemiplegia and hemiparesis following cerebral infarction affecting left non-dominant side: Secondary | ICD-10-CM | POA: Diagnosis not present

## 2018-08-10 DIAGNOSIS — M24542 Contracture, left hand: Secondary | ICD-10-CM | POA: Diagnosis not present

## 2018-08-11 DIAGNOSIS — I69354 Hemiplegia and hemiparesis following cerebral infarction affecting left non-dominant side: Secondary | ICD-10-CM | POA: Diagnosis not present

## 2018-08-11 DIAGNOSIS — I110 Hypertensive heart disease with heart failure: Secondary | ICD-10-CM | POA: Diagnosis not present

## 2018-08-11 DIAGNOSIS — I5032 Chronic diastolic (congestive) heart failure: Secondary | ICD-10-CM | POA: Diagnosis not present

## 2018-08-11 DIAGNOSIS — M19041 Primary osteoarthritis, right hand: Secondary | ICD-10-CM | POA: Diagnosis not present

## 2018-08-11 DIAGNOSIS — M24542 Contracture, left hand: Secondary | ICD-10-CM | POA: Diagnosis not present

## 2018-08-11 DIAGNOSIS — M170 Bilateral primary osteoarthritis of knee: Secondary | ICD-10-CM | POA: Diagnosis not present

## 2018-08-12 DIAGNOSIS — I5032 Chronic diastolic (congestive) heart failure: Secondary | ICD-10-CM | POA: Diagnosis not present

## 2018-08-12 DIAGNOSIS — R410 Disorientation, unspecified: Secondary | ICD-10-CM | POA: Diagnosis not present

## 2018-08-12 DIAGNOSIS — I10 Essential (primary) hypertension: Secondary | ICD-10-CM | POA: Diagnosis not present

## 2018-08-12 DIAGNOSIS — R531 Weakness: Secondary | ICD-10-CM | POA: Diagnosis not present

## 2018-08-12 DIAGNOSIS — L309 Dermatitis, unspecified: Secondary | ICD-10-CM | POA: Diagnosis not present

## 2018-08-12 DIAGNOSIS — G459 Transient cerebral ischemic attack, unspecified: Secondary | ICD-10-CM | POA: Diagnosis not present

## 2018-08-12 DIAGNOSIS — Z7401 Bed confinement status: Secondary | ICD-10-CM | POA: Diagnosis not present

## 2018-08-12 DIAGNOSIS — R5381 Other malaise: Secondary | ICD-10-CM | POA: Diagnosis not present

## 2018-08-12 DIAGNOSIS — I959 Hypotension, unspecified: Secondary | ICD-10-CM | POA: Diagnosis not present

## 2018-08-13 DIAGNOSIS — M19041 Primary osteoarthritis, right hand: Secondary | ICD-10-CM | POA: Diagnosis not present

## 2018-08-13 DIAGNOSIS — I5032 Chronic diastolic (congestive) heart failure: Secondary | ICD-10-CM | POA: Diagnosis not present

## 2018-08-13 DIAGNOSIS — M24542 Contracture, left hand: Secondary | ICD-10-CM | POA: Diagnosis not present

## 2018-08-13 DIAGNOSIS — M170 Bilateral primary osteoarthritis of knee: Secondary | ICD-10-CM | POA: Diagnosis not present

## 2018-08-13 DIAGNOSIS — I110 Hypertensive heart disease with heart failure: Secondary | ICD-10-CM | POA: Diagnosis not present

## 2018-08-13 DIAGNOSIS — I69354 Hemiplegia and hemiparesis following cerebral infarction affecting left non-dominant side: Secondary | ICD-10-CM | POA: Diagnosis not present

## 2018-08-14 DIAGNOSIS — I110 Hypertensive heart disease with heart failure: Secondary | ICD-10-CM | POA: Diagnosis not present

## 2018-08-14 DIAGNOSIS — M24542 Contracture, left hand: Secondary | ICD-10-CM | POA: Diagnosis not present

## 2018-08-14 DIAGNOSIS — I69354 Hemiplegia and hemiparesis following cerebral infarction affecting left non-dominant side: Secondary | ICD-10-CM | POA: Diagnosis not present

## 2018-08-14 DIAGNOSIS — M170 Bilateral primary osteoarthritis of knee: Secondary | ICD-10-CM | POA: Diagnosis not present

## 2018-08-14 DIAGNOSIS — I5032 Chronic diastolic (congestive) heart failure: Secondary | ICD-10-CM | POA: Diagnosis not present

## 2018-08-14 DIAGNOSIS — M19041 Primary osteoarthritis, right hand: Secondary | ICD-10-CM | POA: Diagnosis not present

## 2018-08-17 DIAGNOSIS — M24542 Contracture, left hand: Secondary | ICD-10-CM | POA: Diagnosis not present

## 2018-08-17 DIAGNOSIS — I69354 Hemiplegia and hemiparesis following cerebral infarction affecting left non-dominant side: Secondary | ICD-10-CM | POA: Diagnosis not present

## 2018-08-17 DIAGNOSIS — I110 Hypertensive heart disease with heart failure: Secondary | ICD-10-CM | POA: Diagnosis not present

## 2018-08-17 DIAGNOSIS — I5032 Chronic diastolic (congestive) heart failure: Secondary | ICD-10-CM | POA: Diagnosis not present

## 2018-08-17 DIAGNOSIS — M19041 Primary osteoarthritis, right hand: Secondary | ICD-10-CM | POA: Diagnosis not present

## 2018-08-17 DIAGNOSIS — M170 Bilateral primary osteoarthritis of knee: Secondary | ICD-10-CM | POA: Diagnosis not present

## 2018-08-18 DIAGNOSIS — M170 Bilateral primary osteoarthritis of knee: Secondary | ICD-10-CM | POA: Diagnosis not present

## 2018-08-18 DIAGNOSIS — I69354 Hemiplegia and hemiparesis following cerebral infarction affecting left non-dominant side: Secondary | ICD-10-CM | POA: Diagnosis not present

## 2018-08-18 DIAGNOSIS — M24542 Contracture, left hand: Secondary | ICD-10-CM | POA: Diagnosis not present

## 2018-08-18 DIAGNOSIS — I5032 Chronic diastolic (congestive) heart failure: Secondary | ICD-10-CM | POA: Diagnosis not present

## 2018-08-18 DIAGNOSIS — I110 Hypertensive heart disease with heart failure: Secondary | ICD-10-CM | POA: Diagnosis not present

## 2018-08-18 DIAGNOSIS — M19041 Primary osteoarthritis, right hand: Secondary | ICD-10-CM | POA: Diagnosis not present

## 2018-08-20 DIAGNOSIS — M19041 Primary osteoarthritis, right hand: Secondary | ICD-10-CM | POA: Diagnosis not present

## 2018-08-20 DIAGNOSIS — I69354 Hemiplegia and hemiparesis following cerebral infarction affecting left non-dominant side: Secondary | ICD-10-CM | POA: Diagnosis not present

## 2018-08-20 DIAGNOSIS — I5032 Chronic diastolic (congestive) heart failure: Secondary | ICD-10-CM | POA: Diagnosis not present

## 2018-08-20 DIAGNOSIS — I110 Hypertensive heart disease with heart failure: Secondary | ICD-10-CM | POA: Diagnosis not present

## 2018-08-20 DIAGNOSIS — M24542 Contracture, left hand: Secondary | ICD-10-CM | POA: Diagnosis not present

## 2018-08-20 DIAGNOSIS — M170 Bilateral primary osteoarthritis of knee: Secondary | ICD-10-CM | POA: Diagnosis not present

## 2018-08-21 DIAGNOSIS — I5032 Chronic diastolic (congestive) heart failure: Secondary | ICD-10-CM | POA: Diagnosis not present

## 2018-08-21 DIAGNOSIS — I110 Hypertensive heart disease with heart failure: Secondary | ICD-10-CM | POA: Diagnosis not present

## 2018-08-21 DIAGNOSIS — M170 Bilateral primary osteoarthritis of knee: Secondary | ICD-10-CM | POA: Diagnosis not present

## 2018-08-21 DIAGNOSIS — I69354 Hemiplegia and hemiparesis following cerebral infarction affecting left non-dominant side: Secondary | ICD-10-CM | POA: Diagnosis not present

## 2018-08-21 DIAGNOSIS — M24542 Contracture, left hand: Secondary | ICD-10-CM | POA: Diagnosis not present

## 2018-08-21 DIAGNOSIS — M19041 Primary osteoarthritis, right hand: Secondary | ICD-10-CM | POA: Diagnosis not present

## 2018-08-24 DIAGNOSIS — J45909 Unspecified asthma, uncomplicated: Secondary | ICD-10-CM | POA: Diagnosis not present

## 2018-08-24 DIAGNOSIS — F39 Unspecified mood [affective] disorder: Secondary | ICD-10-CM | POA: Diagnosis not present

## 2018-08-24 DIAGNOSIS — I5032 Chronic diastolic (congestive) heart failure: Secondary | ICD-10-CM | POA: Diagnosis not present

## 2018-08-24 DIAGNOSIS — I110 Hypertensive heart disease with heart failure: Secondary | ICD-10-CM | POA: Diagnosis not present

## 2018-08-24 DIAGNOSIS — Z6841 Body Mass Index (BMI) 40.0 and over, adult: Secondary | ICD-10-CM | POA: Diagnosis not present

## 2018-08-24 DIAGNOSIS — F339 Major depressive disorder, recurrent, unspecified: Secondary | ICD-10-CM | POA: Diagnosis not present

## 2018-08-24 DIAGNOSIS — E039 Hypothyroidism, unspecified: Secondary | ICD-10-CM | POA: Diagnosis not present

## 2018-08-24 DIAGNOSIS — M24542 Contracture, left hand: Secondary | ICD-10-CM | POA: Diagnosis not present

## 2018-08-24 DIAGNOSIS — M170 Bilateral primary osteoarthritis of knee: Secondary | ICD-10-CM | POA: Diagnosis not present

## 2018-08-24 DIAGNOSIS — E669 Obesity, unspecified: Secondary | ICD-10-CM | POA: Diagnosis not present

## 2018-08-24 DIAGNOSIS — D519 Vitamin B12 deficiency anemia, unspecified: Secondary | ICD-10-CM | POA: Diagnosis not present

## 2018-08-24 DIAGNOSIS — F419 Anxiety disorder, unspecified: Secondary | ICD-10-CM | POA: Diagnosis not present

## 2018-08-24 DIAGNOSIS — I69354 Hemiplegia and hemiparesis following cerebral infarction affecting left non-dominant side: Secondary | ICD-10-CM | POA: Diagnosis not present

## 2018-08-24 DIAGNOSIS — I739 Peripheral vascular disease, unspecified: Secondary | ICD-10-CM | POA: Diagnosis not present

## 2018-08-24 DIAGNOSIS — K219 Gastro-esophageal reflux disease without esophagitis: Secondary | ICD-10-CM | POA: Diagnosis not present

## 2018-08-24 DIAGNOSIS — M19041 Primary osteoarthritis, right hand: Secondary | ICD-10-CM | POA: Diagnosis not present

## 2018-08-25 DIAGNOSIS — M19041 Primary osteoarthritis, right hand: Secondary | ICD-10-CM | POA: Diagnosis not present

## 2018-08-25 DIAGNOSIS — I5032 Chronic diastolic (congestive) heart failure: Secondary | ICD-10-CM | POA: Diagnosis not present

## 2018-08-25 DIAGNOSIS — I110 Hypertensive heart disease with heart failure: Secondary | ICD-10-CM | POA: Diagnosis not present

## 2018-08-25 DIAGNOSIS — M170 Bilateral primary osteoarthritis of knee: Secondary | ICD-10-CM | POA: Diagnosis not present

## 2018-08-25 DIAGNOSIS — M24542 Contracture, left hand: Secondary | ICD-10-CM | POA: Diagnosis not present

## 2018-08-25 DIAGNOSIS — I69354 Hemiplegia and hemiparesis following cerebral infarction affecting left non-dominant side: Secondary | ICD-10-CM | POA: Diagnosis not present

## 2018-08-27 DIAGNOSIS — M19041 Primary osteoarthritis, right hand: Secondary | ICD-10-CM | POA: Diagnosis not present

## 2018-08-27 DIAGNOSIS — M24542 Contracture, left hand: Secondary | ICD-10-CM | POA: Diagnosis not present

## 2018-08-27 DIAGNOSIS — M170 Bilateral primary osteoarthritis of knee: Secondary | ICD-10-CM | POA: Diagnosis not present

## 2018-08-27 DIAGNOSIS — I110 Hypertensive heart disease with heart failure: Secondary | ICD-10-CM | POA: Diagnosis not present

## 2018-08-27 DIAGNOSIS — I69354 Hemiplegia and hemiparesis following cerebral infarction affecting left non-dominant side: Secondary | ICD-10-CM | POA: Diagnosis not present

## 2018-08-27 DIAGNOSIS — I5032 Chronic diastolic (congestive) heart failure: Secondary | ICD-10-CM | POA: Diagnosis not present

## 2018-08-28 DIAGNOSIS — I110 Hypertensive heart disease with heart failure: Secondary | ICD-10-CM | POA: Diagnosis not present

## 2018-08-28 DIAGNOSIS — M24542 Contracture, left hand: Secondary | ICD-10-CM | POA: Diagnosis not present

## 2018-08-28 DIAGNOSIS — M170 Bilateral primary osteoarthritis of knee: Secondary | ICD-10-CM | POA: Diagnosis not present

## 2018-08-28 DIAGNOSIS — M19041 Primary osteoarthritis, right hand: Secondary | ICD-10-CM | POA: Diagnosis not present

## 2018-08-28 DIAGNOSIS — I5032 Chronic diastolic (congestive) heart failure: Secondary | ICD-10-CM | POA: Diagnosis not present

## 2018-08-28 DIAGNOSIS — I69354 Hemiplegia and hemiparesis following cerebral infarction affecting left non-dominant side: Secondary | ICD-10-CM | POA: Diagnosis not present

## 2018-08-31 DIAGNOSIS — M170 Bilateral primary osteoarthritis of knee: Secondary | ICD-10-CM | POA: Diagnosis not present

## 2018-08-31 DIAGNOSIS — I69354 Hemiplegia and hemiparesis following cerebral infarction affecting left non-dominant side: Secondary | ICD-10-CM | POA: Diagnosis not present

## 2018-08-31 DIAGNOSIS — M19041 Primary osteoarthritis, right hand: Secondary | ICD-10-CM | POA: Diagnosis not present

## 2018-08-31 DIAGNOSIS — I110 Hypertensive heart disease with heart failure: Secondary | ICD-10-CM | POA: Diagnosis not present

## 2018-08-31 DIAGNOSIS — M24542 Contracture, left hand: Secondary | ICD-10-CM | POA: Diagnosis not present

## 2018-08-31 DIAGNOSIS — I5032 Chronic diastolic (congestive) heart failure: Secondary | ICD-10-CM | POA: Diagnosis not present

## 2018-09-01 DIAGNOSIS — M19041 Primary osteoarthritis, right hand: Secondary | ICD-10-CM | POA: Diagnosis not present

## 2018-09-01 DIAGNOSIS — M24542 Contracture, left hand: Secondary | ICD-10-CM | POA: Diagnosis not present

## 2018-09-01 DIAGNOSIS — I5032 Chronic diastolic (congestive) heart failure: Secondary | ICD-10-CM | POA: Diagnosis not present

## 2018-09-01 DIAGNOSIS — M170 Bilateral primary osteoarthritis of knee: Secondary | ICD-10-CM | POA: Diagnosis not present

## 2018-09-01 DIAGNOSIS — I110 Hypertensive heart disease with heart failure: Secondary | ICD-10-CM | POA: Diagnosis not present

## 2018-09-01 DIAGNOSIS — I69354 Hemiplegia and hemiparesis following cerebral infarction affecting left non-dominant side: Secondary | ICD-10-CM | POA: Diagnosis not present

## 2018-09-03 DIAGNOSIS — I69354 Hemiplegia and hemiparesis following cerebral infarction affecting left non-dominant side: Secondary | ICD-10-CM | POA: Diagnosis not present

## 2018-09-03 DIAGNOSIS — I5032 Chronic diastolic (congestive) heart failure: Secondary | ICD-10-CM | POA: Diagnosis not present

## 2018-09-03 DIAGNOSIS — I110 Hypertensive heart disease with heart failure: Secondary | ICD-10-CM | POA: Diagnosis not present

## 2018-09-03 DIAGNOSIS — M170 Bilateral primary osteoarthritis of knee: Secondary | ICD-10-CM | POA: Diagnosis not present

## 2018-09-03 DIAGNOSIS — M24542 Contracture, left hand: Secondary | ICD-10-CM | POA: Diagnosis not present

## 2018-09-03 DIAGNOSIS — M19041 Primary osteoarthritis, right hand: Secondary | ICD-10-CM | POA: Diagnosis not present

## 2018-09-04 DIAGNOSIS — M19041 Primary osteoarthritis, right hand: Secondary | ICD-10-CM | POA: Diagnosis not present

## 2018-09-04 DIAGNOSIS — M24542 Contracture, left hand: Secondary | ICD-10-CM | POA: Diagnosis not present

## 2018-09-04 DIAGNOSIS — M170 Bilateral primary osteoarthritis of knee: Secondary | ICD-10-CM | POA: Diagnosis not present

## 2018-09-04 DIAGNOSIS — I69354 Hemiplegia and hemiparesis following cerebral infarction affecting left non-dominant side: Secondary | ICD-10-CM | POA: Diagnosis not present

## 2018-09-04 DIAGNOSIS — I5032 Chronic diastolic (congestive) heart failure: Secondary | ICD-10-CM | POA: Diagnosis not present

## 2018-09-04 DIAGNOSIS — I110 Hypertensive heart disease with heart failure: Secondary | ICD-10-CM | POA: Diagnosis not present

## 2018-09-07 DIAGNOSIS — I69354 Hemiplegia and hemiparesis following cerebral infarction affecting left non-dominant side: Secondary | ICD-10-CM | POA: Diagnosis not present

## 2018-09-07 DIAGNOSIS — M19041 Primary osteoarthritis, right hand: Secondary | ICD-10-CM | POA: Diagnosis not present

## 2018-09-07 DIAGNOSIS — I5032 Chronic diastolic (congestive) heart failure: Secondary | ICD-10-CM | POA: Diagnosis not present

## 2018-09-07 DIAGNOSIS — M170 Bilateral primary osteoarthritis of knee: Secondary | ICD-10-CM | POA: Diagnosis not present

## 2018-09-07 DIAGNOSIS — I110 Hypertensive heart disease with heart failure: Secondary | ICD-10-CM | POA: Diagnosis not present

## 2018-09-07 DIAGNOSIS — M24542 Contracture, left hand: Secondary | ICD-10-CM | POA: Diagnosis not present

## 2018-09-08 DIAGNOSIS — I110 Hypertensive heart disease with heart failure: Secondary | ICD-10-CM | POA: Diagnosis not present

## 2018-09-08 DIAGNOSIS — M170 Bilateral primary osteoarthritis of knee: Secondary | ICD-10-CM | POA: Diagnosis not present

## 2018-09-08 DIAGNOSIS — M19041 Primary osteoarthritis, right hand: Secondary | ICD-10-CM | POA: Diagnosis not present

## 2018-09-08 DIAGNOSIS — I5032 Chronic diastolic (congestive) heart failure: Secondary | ICD-10-CM | POA: Diagnosis not present

## 2018-09-08 DIAGNOSIS — I69354 Hemiplegia and hemiparesis following cerebral infarction affecting left non-dominant side: Secondary | ICD-10-CM | POA: Diagnosis not present

## 2018-09-08 DIAGNOSIS — M24542 Contracture, left hand: Secondary | ICD-10-CM | POA: Diagnosis not present

## 2018-09-09 DIAGNOSIS — M24542 Contracture, left hand: Secondary | ICD-10-CM | POA: Diagnosis not present

## 2018-09-09 DIAGNOSIS — M170 Bilateral primary osteoarthritis of knee: Secondary | ICD-10-CM | POA: Diagnosis not present

## 2018-09-09 DIAGNOSIS — I69354 Hemiplegia and hemiparesis following cerebral infarction affecting left non-dominant side: Secondary | ICD-10-CM | POA: Diagnosis not present

## 2018-09-09 DIAGNOSIS — I5032 Chronic diastolic (congestive) heart failure: Secondary | ICD-10-CM | POA: Diagnosis not present

## 2018-09-09 DIAGNOSIS — M19041 Primary osteoarthritis, right hand: Secondary | ICD-10-CM | POA: Diagnosis not present

## 2018-09-09 DIAGNOSIS — I110 Hypertensive heart disease with heart failure: Secondary | ICD-10-CM | POA: Diagnosis not present

## 2018-09-10 DIAGNOSIS — I110 Hypertensive heart disease with heart failure: Secondary | ICD-10-CM | POA: Diagnosis not present

## 2018-09-10 DIAGNOSIS — M170 Bilateral primary osteoarthritis of knee: Secondary | ICD-10-CM | POA: Diagnosis not present

## 2018-09-10 DIAGNOSIS — M24542 Contracture, left hand: Secondary | ICD-10-CM | POA: Diagnosis not present

## 2018-09-10 DIAGNOSIS — I5032 Chronic diastolic (congestive) heart failure: Secondary | ICD-10-CM | POA: Diagnosis not present

## 2018-09-10 DIAGNOSIS — M19041 Primary osteoarthritis, right hand: Secondary | ICD-10-CM | POA: Diagnosis not present

## 2018-09-10 DIAGNOSIS — I69354 Hemiplegia and hemiparesis following cerebral infarction affecting left non-dominant side: Secondary | ICD-10-CM | POA: Diagnosis not present

## 2018-09-11 DIAGNOSIS — I110 Hypertensive heart disease with heart failure: Secondary | ICD-10-CM | POA: Diagnosis not present

## 2018-09-11 DIAGNOSIS — I5032 Chronic diastolic (congestive) heart failure: Secondary | ICD-10-CM | POA: Diagnosis not present

## 2018-09-11 DIAGNOSIS — M170 Bilateral primary osteoarthritis of knee: Secondary | ICD-10-CM | POA: Diagnosis not present

## 2018-09-11 DIAGNOSIS — M19041 Primary osteoarthritis, right hand: Secondary | ICD-10-CM | POA: Diagnosis not present

## 2018-09-11 DIAGNOSIS — I69354 Hemiplegia and hemiparesis following cerebral infarction affecting left non-dominant side: Secondary | ICD-10-CM | POA: Diagnosis not present

## 2018-09-11 DIAGNOSIS — M24542 Contracture, left hand: Secondary | ICD-10-CM | POA: Diagnosis not present

## 2018-09-15 DIAGNOSIS — I5032 Chronic diastolic (congestive) heart failure: Secondary | ICD-10-CM | POA: Diagnosis not present

## 2018-09-15 DIAGNOSIS — M170 Bilateral primary osteoarthritis of knee: Secondary | ICD-10-CM | POA: Diagnosis not present

## 2018-09-15 DIAGNOSIS — M24542 Contracture, left hand: Secondary | ICD-10-CM | POA: Diagnosis not present

## 2018-09-15 DIAGNOSIS — M19041 Primary osteoarthritis, right hand: Secondary | ICD-10-CM | POA: Diagnosis not present

## 2018-09-15 DIAGNOSIS — I69354 Hemiplegia and hemiparesis following cerebral infarction affecting left non-dominant side: Secondary | ICD-10-CM | POA: Diagnosis not present

## 2018-09-15 DIAGNOSIS — I110 Hypertensive heart disease with heart failure: Secondary | ICD-10-CM | POA: Diagnosis not present

## 2018-09-17 DIAGNOSIS — M24542 Contracture, left hand: Secondary | ICD-10-CM | POA: Diagnosis not present

## 2018-09-17 DIAGNOSIS — I69354 Hemiplegia and hemiparesis following cerebral infarction affecting left non-dominant side: Secondary | ICD-10-CM | POA: Diagnosis not present

## 2018-09-17 DIAGNOSIS — M19041 Primary osteoarthritis, right hand: Secondary | ICD-10-CM | POA: Diagnosis not present

## 2018-09-17 DIAGNOSIS — I110 Hypertensive heart disease with heart failure: Secondary | ICD-10-CM | POA: Diagnosis not present

## 2018-09-17 DIAGNOSIS — M170 Bilateral primary osteoarthritis of knee: Secondary | ICD-10-CM | POA: Diagnosis not present

## 2018-09-17 DIAGNOSIS — I5032 Chronic diastolic (congestive) heart failure: Secondary | ICD-10-CM | POA: Diagnosis not present

## 2018-09-18 ENCOUNTER — Other Ambulatory Visit: Payer: Self-pay

## 2018-09-18 ENCOUNTER — Telehealth: Payer: Self-pay | Admitting: Family Medicine

## 2018-09-18 DIAGNOSIS — M170 Bilateral primary osteoarthritis of knee: Secondary | ICD-10-CM | POA: Diagnosis not present

## 2018-09-18 DIAGNOSIS — I5032 Chronic diastolic (congestive) heart failure: Secondary | ICD-10-CM | POA: Diagnosis not present

## 2018-09-18 DIAGNOSIS — I69354 Hemiplegia and hemiparesis following cerebral infarction affecting left non-dominant side: Secondary | ICD-10-CM | POA: Diagnosis not present

## 2018-09-18 DIAGNOSIS — M24542 Contracture, left hand: Secondary | ICD-10-CM | POA: Diagnosis not present

## 2018-09-18 DIAGNOSIS — M19041 Primary osteoarthritis, right hand: Secondary | ICD-10-CM | POA: Diagnosis not present

## 2018-09-18 DIAGNOSIS — I110 Hypertensive heart disease with heart failure: Secondary | ICD-10-CM | POA: Diagnosis not present

## 2018-09-19 DIAGNOSIS — I69354 Hemiplegia and hemiparesis following cerebral infarction affecting left non-dominant side: Secondary | ICD-10-CM | POA: Diagnosis not present

## 2018-09-19 DIAGNOSIS — I110 Hypertensive heart disease with heart failure: Secondary | ICD-10-CM | POA: Diagnosis not present

## 2018-09-19 DIAGNOSIS — I5032 Chronic diastolic (congestive) heart failure: Secondary | ICD-10-CM | POA: Diagnosis not present

## 2018-09-19 DIAGNOSIS — M19041 Primary osteoarthritis, right hand: Secondary | ICD-10-CM | POA: Diagnosis not present

## 2018-09-19 DIAGNOSIS — M170 Bilateral primary osteoarthritis of knee: Secondary | ICD-10-CM | POA: Diagnosis not present

## 2018-09-19 DIAGNOSIS — M24542 Contracture, left hand: Secondary | ICD-10-CM | POA: Diagnosis not present

## 2018-09-21 ENCOUNTER — Other Ambulatory Visit: Payer: Self-pay

## 2018-09-21 ENCOUNTER — Ambulatory Visit (INDEPENDENT_AMBULATORY_CARE_PROVIDER_SITE_OTHER): Payer: Medicare Other | Admitting: Family Medicine

## 2018-09-21 ENCOUNTER — Encounter: Payer: Self-pay | Admitting: Family Medicine

## 2018-09-21 VITALS — BP 100/68 | HR 68 | Temp 98.7°F | Ht 63.0 in

## 2018-09-21 DIAGNOSIS — M1990 Unspecified osteoarthritis, unspecified site: Secondary | ICD-10-CM | POA: Diagnosis not present

## 2018-09-21 DIAGNOSIS — M170 Bilateral primary osteoarthritis of knee: Secondary | ICD-10-CM | POA: Diagnosis not present

## 2018-09-21 DIAGNOSIS — I5032 Chronic diastolic (congestive) heart failure: Secondary | ICD-10-CM | POA: Diagnosis not present

## 2018-09-21 DIAGNOSIS — I110 Hypertensive heart disease with heart failure: Secondary | ICD-10-CM | POA: Diagnosis not present

## 2018-09-21 DIAGNOSIS — I69354 Hemiplegia and hemiparesis following cerebral infarction affecting left non-dominant side: Secondary | ICD-10-CM | POA: Diagnosis not present

## 2018-09-21 DIAGNOSIS — R0602 Shortness of breath: Secondary | ICD-10-CM

## 2018-09-21 DIAGNOSIS — I10 Essential (primary) hypertension: Secondary | ICD-10-CM

## 2018-09-21 DIAGNOSIS — M24542 Contracture, left hand: Secondary | ICD-10-CM | POA: Diagnosis not present

## 2018-09-21 DIAGNOSIS — M19041 Primary osteoarthritis, right hand: Secondary | ICD-10-CM | POA: Diagnosis not present

## 2018-09-21 MED ORDER — TRAZODONE HCL 150 MG PO TABS: ORAL_TABLET | ORAL | 5 refills | Status: DC

## 2018-09-21 MED ORDER — AZELASTINE HCL 0.1 % NA SOLN: 2.0000 | Freq: Two times a day (BID) | NASAL | 12 refills | Status: DC

## 2018-09-21 MED ORDER — MOMETASONE FUROATE 0.1 % EX CREA: 1.0000 "application " | TOPICAL_CREAM | Freq: Every day | CUTANEOUS | 5 refills | Status: DC

## 2018-09-21 MED ORDER — PAROXETINE HCL 20 MG PO TABS: ORAL_TABLET | ORAL | 11 refills | Status: DC

## 2018-09-21 NOTE — Progress Notes (Signed)
Subjective:  Patient ID: Megan Fisher, female    DOB: [birth year]  Age: 85 y.o. MRN: [id number]  CC: New Patient (Initial Visit) (Has spot on her chin that she wants looked at.); Establish Care; Ear Pain (bilateral but right worse - States it has been going on for awhile ); and Depression (Patient was taking paxil and has been off of it x 2 weeks and needs to be put back on it.)   HPI Megan Fisher presents for new patient visit after recent release from ALF. Her daughter brought her home at the beginning of COVID due to the concern of it sweeping through nursing homes. She is bedridden due to an apparent stroke 4 years ago. Since then she has been very weak for left side, but especially the LUE which has contractures at the hand. She cannot use the LUE to perform ADLs. Daughter is here and participates in history collection. She states that her mother was briefly changed from paxil to zoloft and became "evil." She was irritable, angry and difficult. Pt. Says she felt very unsettled through that time. Daughter quit giving it to her several days ago and substituted her own supply of paxil. Today they both request that she be put back on the paxil.   She has a lesion on the chin that has been present several months. It originally occurred as an abrasion when a tech was shaving her chin for her.   Depression screen [facility] 2/9 09/21/2018 10/31/2014 09/27/2013  Decreased Interest 0 3 0  Down, Depressed, Hopeless 0 3 0  PHQ - 2 Score 0 6 0  Altered sleeping - 3 -  Tired, decreased energy - 3 -  Change in appetite - 2 -  Feeling bad or failure about yourself  - 3 -  Trouble concentrating - 2 -  Moving slowly or fidgety/restless - 2 -  Suicidal thoughts - 0 -  PHQ-9 Score - 21 -    History Megan Fisher has a past medical history of Arthritis, Hernia, Hypertension, Obesity, and Vertigo.   She has a past surgical history that includes ooperectomy; Tonsillectomy; Hernia repair; Cholecystectomy; and Breast surgery.    Her family history is not on file.She reports that she has never smoked. She has never used smokeless tobacco. She reports that she does not drink alcohol or use drugs.    ROS Review of Systems  Constitutional: Negative.   HENT: Positive for rhinorrhea (constant drippy nose, chronic). Negative for congestion.   Eyes: Negative for visual disturbance.  Respiratory: Negative for shortness of breath.   Cardiovascular: Negative for chest pain.  Gastrointestinal: Negative for abdominal pain, constipation, diarrhea, nausea and vomiting.  Genitourinary: Negative for difficulty urinating.  Musculoskeletal: Positive for arthralgias and myalgias.  Neurological: Positive for weakness. Negative for headaches.  Psychiatric/Behavioral: Negative for sleep disturbance.    Objective:  BP 100/68 (BP Location: Left Arm)   Pulse 68   Temp 98.7 F (37.1 C) (Oral)   Ht _0  (1.6 m)   BMI 49.25 kg/m   BP Readings from Last 3 Encounters:  09/21/18 100/68  10/31/14 128/70  10/27/14 132/82    Wt Readings from Last 3 Encounters:  10/06/14 278 lb (126.1 kg)  09/27/13 259 lb 6.4 oz (117.7 kg)  05/10/13 253 lb (114.8 kg)     Physical Exam Constitutional:      General: She is not in acute distress.    Appearance: She is well-developed.     Comments: Pt. Seen lying  on stretcher from EMS that brought her to the office. Morbidly obese in appearance, but unable to weigh.    HENT:     Head: Normocephalic and atraumatic.     Right Ear: External ear normal.     Left Ear: External ear normal.     Nose: Nose normal.  Eyes:     Conjunctiva/sclera: Conjunctivae normal.     Pupils: Pupils are equal, round, and reactive to light.  Neck:     Musculoskeletal: Normal range of motion and neck supple.     Thyroid: No thyromegaly.  Cardiovascular:     Rate and Rhythm: Normal rate and regular rhythm.     Heart sounds: Normal heart sounds. No murmur.  Pulmonary:     Effort: Pulmonary effort is normal. No  respiratory distress.     Breath sounds: Normal breath sounds. No wheezing or rales.  Abdominal:     General: Bowel sounds are normal. There is no distension.     Palpations: Abdomen is soft.     Tenderness: There is no abdominal tenderness.  Lymphadenopathy:     Cervical: No cervical adenopathy.  Skin:    General: Skin is warm and dry.     Findings: Lesion (1 cm abrasion at chin, just left of center. Grade two depth. No cellulitis. ) present.  Neurological:     Mental Status: She is alert and oriented to person, place, and time.     Deep Tendon Reflexes: Reflexes are normal and symmetric.  Psychiatric:        Mood and Affect: Mood normal.        Behavior: Behavior normal.        Thought Content: Thought content normal.       Assessment & Plan:   Megan Fisher was seen today for new patient (initial visit), establish care, ear pain and depression.  Diagnoses and all orders for this visit:  Arthritis -     CBC with Differential/Platelet  Essential hypertension -     Brain natriuretic peptide -     CMP14+EGFR -     CBC with Differential/Platelet  SOB (shortness of breath) -     Brain natriuretic peptide -     CBC with Differential/Platelet -     TSH -     VITAMIN D 25 Hydroxy (Vit-D Deficiency, Fractures)  Other orders -     PARoxetine (PAXIL) 20 MG tablet; TAKE 1 TABLET IN MORNING -     mometasone (ELOCON) 0.1 % cream; Apply 1 application topically daily. To sore on chin -     traZODone (DESYREL) 150 MG tablet; Use from 1/3 to 1 tablet nightly as needed for sleep. -     azelastine (ASTELIN) 0.1 % nasal spray; Place 2 sprays into both nostrils 2 (two) times daily. Use in each nostril as directed       I have discontinued Megan Fisher potassium chloride SA, multivitamin with minerals, and sulfamethoxazole-trimethoprim. I am also having her start on mometasone, traZODone, and azelastine. Additionally, I am having her maintain her meclizine, omeprazole, cholecalciferol,  vitamin B-12, furosemide, benazepril, docusate sodium, loratadine, Cranberry, calcium carbonate, acetaminophen, diclofenac sodium, promethazine, LORazepam, and PARoxetine.  Allergies as of 09/21/2018      Reactions   Propoxyphene Anaphylaxis   Citalopram Other (See Comments)   Reaction is unknown   Codeine Other (See Comments)   Altered mental status "Makes Crazy"   Levaquin [levofloxacin In D5w] Diarrhea   Oxycontin [oxycodone Hcl] Other (See Comments)  Reaction unknown. On Oxytrol patches in past, states ineffective   Oxycontin [oxycodone Hcl] Other (See Comments)   Altered mental status   Zoloft [sertraline Hcl] Other (See Comments)   Aspirin Rash   Penicillins Swelling, Rash      Medication List       Accurate as of September 21, 2018 11:59 PM. If you have any questions, ask your nurse or doctor.        STOP taking these medications   multivitamin with minerals Tabs tablet Stopped by:  Claretta Fraise, MD   potassium chloride SA 20 MEQ tablet Commonly known as:  K-DUR Stopped by:  Claretta Fraise, MD   sulfamethoxazole-trimethoprim 800-160 MG tablet Commonly known as:  BACTRIM DS Stopped by:  Claretta Fraise, MD     TAKE these medications   acetaminophen 325 MG tablet Commonly known as:  TYLENOL Take 650 mg by mouth every 6 (six) hours as needed.   azelastine 0.1 % nasal spray Commonly known as:  ASTELIN Place 2 sprays into both nostrils 2 (two) times daily. Use in each nostril as directed Started by:  Claretta Fraise, MD   benazepril 10 MG tablet Commonly known as:  LOTENSIN Take 10 mg by mouth daily.   calcium carbonate 1250 (500 Ca) MG tablet Commonly known as:  OS-CAL - dosed in mg of elemental calcium Take 1 tablet by mouth.   cholecalciferol 1000 units tablet Commonly known as:  VITAMIN D Take 1,000 Units by mouth daily.   Cranberry 450 MG Caps Take by mouth.   diclofenac sodium 1 % Gel Commonly known as:  VOLTAREN Apply topically 4 (four) times daily.    docusate sodium 100 MG capsule Commonly known as:  COLACE Take 100 mg by mouth 2 (two) times daily.   furosemide 20 MG tablet Commonly known as:  LASIX Take 20 mg by mouth. What changed:  Another medication with the same name was removed. Continue taking this medication, and follow the directions you see here. Changed by:  Claretta Fraise, MD   loratadine 10 MG tablet Commonly known as:  CLARITIN Take 10 mg by mouth daily.   LORazepam 0.5 MG tablet Commonly known as:  ATIVAN Take 0.5 mg by mouth at bedtime.   meclizine 25 MG tablet Commonly known as:  ANTIVERT TAKE (1) TABLET THREE TIMES DAILY AS NEEDED. What changed:  See the new instructions.   mometasone 0.1 % cream Commonly known as:  Elocon Apply 1 application topically daily. To sore on chin Started by:  Claretta Fraise, MD   omeprazole 20 MG capsule Commonly known as:  PRILOSEC Take 20 mg by mouth daily.   PARoxetine 20 MG tablet Commonly known as:  PAXIL TAKE 1 TABLET IN MORNING   promethazine 25 MG tablet Commonly known as:  PHENERGAN Take 25 mg by mouth every 6 (six) hours as needed for nausea or vomiting.   traZODone 150 MG tablet Commonly known as:  DESYREL Use from 1/3 to 1 tablet nightly as needed for sleep. Started by:  Claretta Fraise, MD   vitamin B-12 1000 MCG tablet Commonly known as:  CYANOCOBALAMIN Take 1,000 mcg by mouth daily.        Follow-up: Return in about 3 months (around 12/22/2018).  Claretta Fraise, M.D.

## 2018-09-22 ENCOUNTER — Encounter: Payer: Self-pay | Admitting: Family Medicine

## 2018-09-22 ENCOUNTER — Telehealth: Payer: Self-pay | Admitting: *Deleted

## 2018-09-22 LAB — CMP14+EGFR
ALT: 8 IU/L (ref 0–32)
AST: 14 IU/L (ref 0–40)
Albumin/Globulin Ratio: 1.5 (ref 1.2–2.2)
Albumin: 3.8 g/dL (ref 3.6–4.6)
Alkaline Phosphatase: 82 IU/L (ref 39–117)
BUN/Creatinine Ratio: 19 (ref 12–28)
BUN: 16 mg/dL (ref 8–27)
Bilirubin Total: 0.4 mg/dL (ref 0.0–1.2)
CO2: 24 mmol/L (ref 20–29)
Calcium: 9.6 mg/dL (ref 8.7–10.3)
Chloride: 100 mmol/L (ref 96–106)
Creatinine, Ser: 0.84 mg/dL (ref 0.57–1.00)
GFR calc Af Amer: 72 mL/min/{1.73_m2} (ref >59)
GFR calc non Af Amer: 62 mL/min/{1.73_m2} (ref >59)
Globulin, Total: 2.5 g/dL (ref 1.5–4.5)
Glucose: 104 mg/dL — ABNORMAL HIGH (ref 65–99)
Potassium: 4.1 mmol/L (ref 3.5–5.2)
Sodium: 140 mmol/L (ref 134–144)
Total Protein: 6.3 g/dL (ref 6.0–8.5)

## 2018-09-22 LAB — CBC WITH DIFFERENTIAL/PLATELET
Basophils Absolute: 0 10*3/uL (ref 0.0–0.2)
Basos: 0 %
EOS (ABSOLUTE): 0.2 10*3/uL (ref 0.0–0.4)
Eos: 3 %
Hematocrit: 43.6 % (ref 34.0–46.6)
Hemoglobin: 15.7 g/dL (ref 11.1–15.9)
Immature Grans (Abs): 0 10*3/uL (ref 0.0–0.1)
Immature Granulocytes: 0 %
Lymphocytes Absolute: 2.2 10*3/uL (ref 0.7–3.1)
Lymphs: 29 %
MCH: 31.9 pg (ref 26.6–33.0)
MCHC: 36 g/dL — ABNORMAL HIGH (ref 31.5–35.7)
MCV: 89 fL (ref 79–97)
Monocytes Absolute: 0.5 10*3/uL (ref 0.1–0.9)
Monocytes: 6 %
Neutrophils Absolute: 4.6 10*3/uL (ref 1.4–7.0)
Neutrophils: 62 %
Platelets: 192 10*3/uL (ref 150–450)
RBC: 4.92 x10E6/uL (ref 3.77–5.28)
RDW: 12.5 % (ref 11.7–15.4)
WBC: 7.5 10*3/uL (ref 3.4–10.8)

## 2018-09-22 LAB — TSH: TSH: 0.655 u[IU]/mL (ref 0.450–4.500)

## 2018-09-22 LAB — BRAIN NATRIURETIC PEPTIDE: BNP: 151 pg/mL — ABNORMAL HIGH (ref 0.0–100.0)

## 2018-09-22 LAB — VITAMIN D 25 HYDROXY (VIT D DEFICIENCY, FRACTURES): Vit D, 25-Hydroxy: 36.7 ng/mL (ref 30.0–100.0)

## 2018-09-22 NOTE — Telephone Encounter (Signed)
That sounds great. She also needs Occ med for hands. Thanks, WS

## 2018-09-22 NOTE — Telephone Encounter (Signed)
Megan Fisher aware of order

## 2018-09-22 NOTE — Progress Notes (Signed)
Hello Megan Fisher,  Your lab result is normal.Some minor variations that are not significant are commonly marked abnormal, but do not represent any medical problem for you.  Best regards, Mechele Claude, M.D.

## 2018-09-22 NOTE — Telephone Encounter (Signed)
VM from [facility]/ [facility] Needing verbal order to continue PT for 8 more weeks Please advise, since patient has just recently became your patient

## 2018-09-23 ENCOUNTER — Other Ambulatory Visit: Payer: Self-pay

## 2018-09-23 ENCOUNTER — Ambulatory Visit (INDEPENDENT_AMBULATORY_CARE_PROVIDER_SITE_OTHER): Payer: Medicare Other

## 2018-09-23 DIAGNOSIS — M19041 Primary osteoarthritis, right hand: Secondary | ICD-10-CM

## 2018-09-23 DIAGNOSIS — M24542 Contracture, left hand: Secondary | ICD-10-CM | POA: Diagnosis not present

## 2018-09-23 DIAGNOSIS — I5032 Chronic diastolic (congestive) heart failure: Secondary | ICD-10-CM

## 2018-09-23 DIAGNOSIS — D519 Vitamin B12 deficiency anemia, unspecified: Secondary | ICD-10-CM | POA: Diagnosis not present

## 2018-09-23 DIAGNOSIS — M170 Bilateral primary osteoarthritis of knee: Secondary | ICD-10-CM | POA: Diagnosis not present

## 2018-09-23 DIAGNOSIS — E669 Obesity, unspecified: Secondary | ICD-10-CM

## 2018-09-23 DIAGNOSIS — I69354 Hemiplegia and hemiparesis following cerebral infarction affecting left non-dominant side: Secondary | ICD-10-CM

## 2018-09-23 DIAGNOSIS — F419 Anxiety disorder, unspecified: Secondary | ICD-10-CM

## 2018-09-23 DIAGNOSIS — Z6841 Body Mass Index (BMI) 40.0 and over, adult: Secondary | ICD-10-CM | POA: Diagnosis not present

## 2018-09-23 DIAGNOSIS — K219 Gastro-esophageal reflux disease without esophagitis: Secondary | ICD-10-CM | POA: Diagnosis not present

## 2018-09-23 DIAGNOSIS — I110 Hypertensive heart disease with heart failure: Secondary | ICD-10-CM

## 2018-09-23 DIAGNOSIS — I739 Peripheral vascular disease, unspecified: Secondary | ICD-10-CM

## 2018-09-23 DIAGNOSIS — J45909 Unspecified asthma, uncomplicated: Secondary | ICD-10-CM | POA: Diagnosis not present

## 2018-09-23 DIAGNOSIS — F339 Major depressive disorder, recurrent, unspecified: Secondary | ICD-10-CM

## 2018-09-23 DIAGNOSIS — F39 Unspecified mood [affective] disorder: Secondary | ICD-10-CM | POA: Diagnosis not present

## 2018-09-24 ENCOUNTER — Telehealth: Payer: Self-pay | Admitting: Family Medicine

## 2018-09-24 DIAGNOSIS — I69354 Hemiplegia and hemiparesis following cerebral infarction affecting left non-dominant side: Secondary | ICD-10-CM | POA: Diagnosis not present

## 2018-09-24 DIAGNOSIS — M170 Bilateral primary osteoarthritis of knee: Secondary | ICD-10-CM | POA: Diagnosis not present

## 2018-09-24 DIAGNOSIS — I110 Hypertensive heart disease with heart failure: Secondary | ICD-10-CM | POA: Diagnosis not present

## 2018-09-24 DIAGNOSIS — M19041 Primary osteoarthritis, right hand: Secondary | ICD-10-CM | POA: Diagnosis not present

## 2018-09-24 DIAGNOSIS — I5032 Chronic diastolic (congestive) heart failure: Secondary | ICD-10-CM | POA: Diagnosis not present

## 2018-09-24 DIAGNOSIS — M24542 Contracture, left hand: Secondary | ICD-10-CM | POA: Diagnosis not present

## 2018-09-24 NOTE — Telephone Encounter (Signed)
Dup encounter.

## 2018-09-25 DIAGNOSIS — M24542 Contracture, left hand: Secondary | ICD-10-CM | POA: Diagnosis not present

## 2018-09-25 DIAGNOSIS — I5032 Chronic diastolic (congestive) heart failure: Secondary | ICD-10-CM | POA: Diagnosis not present

## 2018-09-25 DIAGNOSIS — I110 Hypertensive heart disease with heart failure: Secondary | ICD-10-CM | POA: Diagnosis not present

## 2018-09-25 DIAGNOSIS — M170 Bilateral primary osteoarthritis of knee: Secondary | ICD-10-CM | POA: Diagnosis not present

## 2018-09-25 DIAGNOSIS — M19041 Primary osteoarthritis, right hand: Secondary | ICD-10-CM | POA: Diagnosis not present

## 2018-09-25 DIAGNOSIS — I69354 Hemiplegia and hemiparesis following cerebral infarction affecting left non-dominant side: Secondary | ICD-10-CM | POA: Diagnosis not present

## 2018-09-28 ENCOUNTER — Telehealth: Payer: Self-pay | Admitting: Family Medicine

## 2018-09-28 DIAGNOSIS — I69354 Hemiplegia and hemiparesis following cerebral infarction affecting left non-dominant side: Secondary | ICD-10-CM | POA: Diagnosis not present

## 2018-09-28 DIAGNOSIS — M170 Bilateral primary osteoarthritis of knee: Secondary | ICD-10-CM | POA: Diagnosis not present

## 2018-09-28 DIAGNOSIS — M24542 Contracture, left hand: Secondary | ICD-10-CM | POA: Diagnosis not present

## 2018-09-28 DIAGNOSIS — I110 Hypertensive heart disease with heart failure: Secondary | ICD-10-CM | POA: Diagnosis not present

## 2018-09-28 DIAGNOSIS — I5032 Chronic diastolic (congestive) heart failure: Secondary | ICD-10-CM | POA: Diagnosis not present

## 2018-09-28 DIAGNOSIS — M19041 Primary osteoarthritis, right hand: Secondary | ICD-10-CM | POA: Diagnosis not present

## 2018-09-28 NOTE — Telephone Encounter (Signed)
Daughter calling to get lab results.

## 2018-09-28 NOTE — Telephone Encounter (Signed)
Aware of results. 

## 2018-09-29 DIAGNOSIS — I69354 Hemiplegia and hemiparesis following cerebral infarction affecting left non-dominant side: Secondary | ICD-10-CM | POA: Diagnosis not present

## 2018-09-29 DIAGNOSIS — M170 Bilateral primary osteoarthritis of knee: Secondary | ICD-10-CM | POA: Diagnosis not present

## 2018-09-29 DIAGNOSIS — I5032 Chronic diastolic (congestive) heart failure: Secondary | ICD-10-CM | POA: Diagnosis not present

## 2018-09-29 DIAGNOSIS — M24542 Contracture, left hand: Secondary | ICD-10-CM | POA: Diagnosis not present

## 2018-09-29 DIAGNOSIS — I110 Hypertensive heart disease with heart failure: Secondary | ICD-10-CM | POA: Diagnosis not present

## 2018-09-29 DIAGNOSIS — M19041 Primary osteoarthritis, right hand: Secondary | ICD-10-CM | POA: Diagnosis not present

## 2018-09-30 DIAGNOSIS — I5032 Chronic diastolic (congestive) heart failure: Secondary | ICD-10-CM | POA: Diagnosis not present

## 2018-09-30 DIAGNOSIS — M19041 Primary osteoarthritis, right hand: Secondary | ICD-10-CM | POA: Diagnosis not present

## 2018-09-30 DIAGNOSIS — I110 Hypertensive heart disease with heart failure: Secondary | ICD-10-CM | POA: Diagnosis not present

## 2018-09-30 DIAGNOSIS — I69354 Hemiplegia and hemiparesis following cerebral infarction affecting left non-dominant side: Secondary | ICD-10-CM | POA: Diagnosis not present

## 2018-09-30 DIAGNOSIS — M170 Bilateral primary osteoarthritis of knee: Secondary | ICD-10-CM | POA: Diagnosis not present

## 2018-09-30 DIAGNOSIS — M24542 Contracture, left hand: Secondary | ICD-10-CM | POA: Diagnosis not present

## 2018-10-01 ENCOUNTER — Other Ambulatory Visit: Payer: Self-pay | Admitting: *Deleted

## 2018-10-01 DIAGNOSIS — M170 Bilateral primary osteoarthritis of knee: Secondary | ICD-10-CM | POA: Diagnosis not present

## 2018-10-01 DIAGNOSIS — I110 Hypertensive heart disease with heart failure: Secondary | ICD-10-CM | POA: Diagnosis not present

## 2018-10-01 DIAGNOSIS — M24542 Contracture, left hand: Secondary | ICD-10-CM | POA: Diagnosis not present

## 2018-10-01 DIAGNOSIS — M19041 Primary osteoarthritis, right hand: Secondary | ICD-10-CM | POA: Diagnosis not present

## 2018-10-01 DIAGNOSIS — I5032 Chronic diastolic (congestive) heart failure: Secondary | ICD-10-CM | POA: Diagnosis not present

## 2018-10-01 DIAGNOSIS — I69354 Hemiplegia and hemiparesis following cerebral infarction affecting left non-dominant side: Secondary | ICD-10-CM | POA: Diagnosis not present

## 2018-10-02 DIAGNOSIS — M170 Bilateral primary osteoarthritis of knee: Secondary | ICD-10-CM | POA: Diagnosis not present

## 2018-10-02 DIAGNOSIS — M19041 Primary osteoarthritis, right hand: Secondary | ICD-10-CM | POA: Diagnosis not present

## 2018-10-02 DIAGNOSIS — M24542 Contracture, left hand: Secondary | ICD-10-CM | POA: Diagnosis not present

## 2018-10-02 DIAGNOSIS — I5032 Chronic diastolic (congestive) heart failure: Secondary | ICD-10-CM | POA: Diagnosis not present

## 2018-10-02 DIAGNOSIS — I69354 Hemiplegia and hemiparesis following cerebral infarction affecting left non-dominant side: Secondary | ICD-10-CM | POA: Diagnosis not present

## 2018-10-02 DIAGNOSIS — I110 Hypertensive heart disease with heart failure: Secondary | ICD-10-CM | POA: Diagnosis not present

## 2018-10-05 DIAGNOSIS — M24542 Contracture, left hand: Secondary | ICD-10-CM | POA: Diagnosis not present

## 2018-10-05 DIAGNOSIS — M170 Bilateral primary osteoarthritis of knee: Secondary | ICD-10-CM | POA: Diagnosis not present

## 2018-10-05 DIAGNOSIS — I69354 Hemiplegia and hemiparesis following cerebral infarction affecting left non-dominant side: Secondary | ICD-10-CM | POA: Diagnosis not present

## 2018-10-05 DIAGNOSIS — I5032 Chronic diastolic (congestive) heart failure: Secondary | ICD-10-CM | POA: Diagnosis not present

## 2018-10-05 DIAGNOSIS — I110 Hypertensive heart disease with heart failure: Secondary | ICD-10-CM | POA: Diagnosis not present

## 2018-10-05 DIAGNOSIS — M19041 Primary osteoarthritis, right hand: Secondary | ICD-10-CM | POA: Diagnosis not present

## 2018-10-05 MED ORDER — LORAZEPAM 0.5 MG PO TABS: 0.5000 mg | ORAL_TABLET | Freq: Every day | ORAL | 2 refills | Status: DC

## 2018-10-06 DIAGNOSIS — M170 Bilateral primary osteoarthritis of knee: Secondary | ICD-10-CM | POA: Diagnosis not present

## 2018-10-06 DIAGNOSIS — I69354 Hemiplegia and hemiparesis following cerebral infarction affecting left non-dominant side: Secondary | ICD-10-CM | POA: Diagnosis not present

## 2018-10-06 DIAGNOSIS — I5032 Chronic diastolic (congestive) heart failure: Secondary | ICD-10-CM | POA: Diagnosis not present

## 2018-10-06 DIAGNOSIS — M19041 Primary osteoarthritis, right hand: Secondary | ICD-10-CM | POA: Diagnosis not present

## 2018-10-06 DIAGNOSIS — M24542 Contracture, left hand: Secondary | ICD-10-CM | POA: Diagnosis not present

## 2018-10-06 DIAGNOSIS — I110 Hypertensive heart disease with heart failure: Secondary | ICD-10-CM | POA: Diagnosis not present

## 2018-10-07 DIAGNOSIS — I110 Hypertensive heart disease with heart failure: Secondary | ICD-10-CM | POA: Diagnosis not present

## 2018-10-07 DIAGNOSIS — M170 Bilateral primary osteoarthritis of knee: Secondary | ICD-10-CM | POA: Diagnosis not present

## 2018-10-07 DIAGNOSIS — M24542 Contracture, left hand: Secondary | ICD-10-CM | POA: Diagnosis not present

## 2018-10-07 DIAGNOSIS — I69354 Hemiplegia and hemiparesis following cerebral infarction affecting left non-dominant side: Secondary | ICD-10-CM | POA: Diagnosis not present

## 2018-10-07 DIAGNOSIS — M19041 Primary osteoarthritis, right hand: Secondary | ICD-10-CM | POA: Diagnosis not present

## 2018-10-07 DIAGNOSIS — I5032 Chronic diastolic (congestive) heart failure: Secondary | ICD-10-CM | POA: Diagnosis not present

## 2018-10-08 DIAGNOSIS — M170 Bilateral primary osteoarthritis of knee: Secondary | ICD-10-CM | POA: Diagnosis not present

## 2018-10-08 DIAGNOSIS — I69354 Hemiplegia and hemiparesis following cerebral infarction affecting left non-dominant side: Secondary | ICD-10-CM | POA: Diagnosis not present

## 2018-10-08 DIAGNOSIS — I110 Hypertensive heart disease with heart failure: Secondary | ICD-10-CM | POA: Diagnosis not present

## 2018-10-08 DIAGNOSIS — M24542 Contracture, left hand: Secondary | ICD-10-CM | POA: Diagnosis not present

## 2018-10-08 DIAGNOSIS — M19041 Primary osteoarthritis, right hand: Secondary | ICD-10-CM | POA: Diagnosis not present

## 2018-10-08 DIAGNOSIS — I5032 Chronic diastolic (congestive) heart failure: Secondary | ICD-10-CM | POA: Diagnosis not present

## 2018-10-09 DIAGNOSIS — I110 Hypertensive heart disease with heart failure: Secondary | ICD-10-CM | POA: Diagnosis not present

## 2018-10-09 DIAGNOSIS — I5032 Chronic diastolic (congestive) heart failure: Secondary | ICD-10-CM | POA: Diagnosis not present

## 2018-10-09 DIAGNOSIS — M24542 Contracture, left hand: Secondary | ICD-10-CM | POA: Diagnosis not present

## 2018-10-09 DIAGNOSIS — M19041 Primary osteoarthritis, right hand: Secondary | ICD-10-CM | POA: Diagnosis not present

## 2018-10-09 DIAGNOSIS — I69354 Hemiplegia and hemiparesis following cerebral infarction affecting left non-dominant side: Secondary | ICD-10-CM | POA: Diagnosis not present

## 2018-10-09 DIAGNOSIS — M170 Bilateral primary osteoarthritis of knee: Secondary | ICD-10-CM | POA: Diagnosis not present

## 2018-10-12 DIAGNOSIS — M19041 Primary osteoarthritis, right hand: Secondary | ICD-10-CM | POA: Diagnosis not present

## 2018-10-12 DIAGNOSIS — I69354 Hemiplegia and hemiparesis following cerebral infarction affecting left non-dominant side: Secondary | ICD-10-CM | POA: Diagnosis not present

## 2018-10-12 DIAGNOSIS — M24542 Contracture, left hand: Secondary | ICD-10-CM | POA: Diagnosis not present

## 2018-10-12 DIAGNOSIS — I110 Hypertensive heart disease with heart failure: Secondary | ICD-10-CM | POA: Diagnosis not present

## 2018-10-12 DIAGNOSIS — I5032 Chronic diastolic (congestive) heart failure: Secondary | ICD-10-CM | POA: Diagnosis not present

## 2018-10-12 DIAGNOSIS — M170 Bilateral primary osteoarthritis of knee: Secondary | ICD-10-CM | POA: Diagnosis not present

## 2018-10-13 DIAGNOSIS — I69354 Hemiplegia and hemiparesis following cerebral infarction affecting left non-dominant side: Secondary | ICD-10-CM | POA: Diagnosis not present

## 2018-10-13 DIAGNOSIS — M24542 Contracture, left hand: Secondary | ICD-10-CM | POA: Diagnosis not present

## 2018-10-13 DIAGNOSIS — I5032 Chronic diastolic (congestive) heart failure: Secondary | ICD-10-CM | POA: Diagnosis not present

## 2018-10-13 DIAGNOSIS — M19041 Primary osteoarthritis, right hand: Secondary | ICD-10-CM | POA: Diagnosis not present

## 2018-10-13 DIAGNOSIS — I110 Hypertensive heart disease with heart failure: Secondary | ICD-10-CM | POA: Diagnosis not present

## 2018-10-13 DIAGNOSIS — M170 Bilateral primary osteoarthritis of knee: Secondary | ICD-10-CM | POA: Diagnosis not present

## 2018-10-14 DIAGNOSIS — I5032 Chronic diastolic (congestive) heart failure: Secondary | ICD-10-CM | POA: Diagnosis not present

## 2018-10-14 DIAGNOSIS — M24542 Contracture, left hand: Secondary | ICD-10-CM | POA: Diagnosis not present

## 2018-10-14 DIAGNOSIS — I69354 Hemiplegia and hemiparesis following cerebral infarction affecting left non-dominant side: Secondary | ICD-10-CM | POA: Diagnosis not present

## 2018-10-14 DIAGNOSIS — M19041 Primary osteoarthritis, right hand: Secondary | ICD-10-CM | POA: Diagnosis not present

## 2018-10-14 DIAGNOSIS — M170 Bilateral primary osteoarthritis of knee: Secondary | ICD-10-CM | POA: Diagnosis not present

## 2018-10-14 DIAGNOSIS — I110 Hypertensive heart disease with heart failure: Secondary | ICD-10-CM | POA: Diagnosis not present

## 2018-10-15 DIAGNOSIS — I5032 Chronic diastolic (congestive) heart failure: Secondary | ICD-10-CM | POA: Diagnosis not present

## 2018-10-15 DIAGNOSIS — I69354 Hemiplegia and hemiparesis following cerebral infarction affecting left non-dominant side: Secondary | ICD-10-CM | POA: Diagnosis not present

## 2018-10-15 DIAGNOSIS — I110 Hypertensive heart disease with heart failure: Secondary | ICD-10-CM | POA: Diagnosis not present

## 2018-10-15 DIAGNOSIS — M170 Bilateral primary osteoarthritis of knee: Secondary | ICD-10-CM | POA: Diagnosis not present

## 2018-10-15 DIAGNOSIS — M24542 Contracture, left hand: Secondary | ICD-10-CM | POA: Diagnosis not present

## 2018-10-15 DIAGNOSIS — M19041 Primary osteoarthritis, right hand: Secondary | ICD-10-CM | POA: Diagnosis not present

## 2018-10-16 ENCOUNTER — Telehealth: Payer: Self-pay | Admitting: *Deleted

## 2018-10-16 DIAGNOSIS — M19041 Primary osteoarthritis, right hand: Secondary | ICD-10-CM | POA: Diagnosis not present

## 2018-10-16 DIAGNOSIS — I110 Hypertensive heart disease with heart failure: Secondary | ICD-10-CM | POA: Diagnosis not present

## 2018-10-16 DIAGNOSIS — I69354 Hemiplegia and hemiparesis following cerebral infarction affecting left non-dominant side: Secondary | ICD-10-CM | POA: Diagnosis not present

## 2018-10-16 DIAGNOSIS — M170 Bilateral primary osteoarthritis of knee: Secondary | ICD-10-CM | POA: Diagnosis not present

## 2018-10-16 DIAGNOSIS — M24542 Contracture, left hand: Secondary | ICD-10-CM | POA: Diagnosis not present

## 2018-10-16 DIAGNOSIS — I5032 Chronic diastolic (congestive) heart failure: Secondary | ICD-10-CM | POA: Diagnosis not present

## 2018-10-16 NOTE — Telephone Encounter (Signed)
Left detailed message on [facility] nurse phone advising ok for OT to be extended and to call back with any questions or concerns.

## 2018-10-16 NOTE — Telephone Encounter (Signed)
[place] [facility] OT called and is requesting a extended order for OT  She would like to go once a week for 4 week and then reevaluate.  Is this ok?  Call Mallory at [phone]

## 2018-10-16 NOTE — Telephone Encounter (Signed)
Please authorize. That sounds like a great plan to me. Thanks, WS

## 2018-10-19 ENCOUNTER — Other Ambulatory Visit: Payer: Self-pay

## 2018-10-19 ENCOUNTER — Ambulatory Visit (INDEPENDENT_AMBULATORY_CARE_PROVIDER_SITE_OTHER): Payer: Medicare Other | Admitting: Family Medicine

## 2018-10-19 ENCOUNTER — Encounter: Payer: Self-pay | Admitting: Family Medicine

## 2018-10-19 DIAGNOSIS — M24542 Contracture, left hand: Secondary | ICD-10-CM | POA: Diagnosis not present

## 2018-10-19 DIAGNOSIS — I5032 Chronic diastolic (congestive) heart failure: Secondary | ICD-10-CM | POA: Diagnosis not present

## 2018-10-19 DIAGNOSIS — M170 Bilateral primary osteoarthritis of knee: Secondary | ICD-10-CM | POA: Diagnosis not present

## 2018-10-19 DIAGNOSIS — H00014 Hordeolum externum left upper eyelid: Secondary | ICD-10-CM | POA: Diagnosis not present

## 2018-10-19 DIAGNOSIS — I110 Hypertensive heart disease with heart failure: Secondary | ICD-10-CM | POA: Diagnosis not present

## 2018-10-19 DIAGNOSIS — M19041 Primary osteoarthritis, right hand: Secondary | ICD-10-CM | POA: Diagnosis not present

## 2018-10-19 DIAGNOSIS — I69354 Hemiplegia and hemiparesis following cerebral infarction affecting left non-dominant side: Secondary | ICD-10-CM | POA: Diagnosis not present

## 2018-10-19 MED ORDER — TOBRAMYCIN-DEXAMETHASONE 0.3-0.1 % OP SUSP: OPHTHALMIC | 0 refills | Status: DC

## 2018-10-19 NOTE — Progress Notes (Signed)
    Subjective:    Patient ID: Megan Fisher, female    DOB: 01/13/1929, 85 y.o.   MRN: [id number]   HPI: Megan Fisher is a 85 y.o. female presenting for stye on inside of left lower lid. Yesterday came to a head. Red at lower lid today. Photophobic, but otherwise no vision change. (Has cataracts)   Depression screen [facility] 2/9 09/21/2018 10/31/2014 09/27/2013  Decreased Interest 0 3 0  Down, Depressed, Hopeless 0 3 0  PHQ - 2 Score 0 6 0  Altered sleeping - 3 -  Tired, decreased energy - 3 -  Change in appetite - 2 -  Feeling bad or failure about yourself  - 3 -  Trouble concentrating - 2 -  Moving slowly or fidgety/restless - 2 -  Suicidal thoughts - 0 -  PHQ-9 Score - 21 -     Relevant past medical, surgical, family and social history reviewed and updated as indicated.  Interim medical history since our last visit reviewed. Allergies and medications reviewed and updated.  ROS:  Review of Systems  Constitutional: Negative for fever.  Eyes: Negative for visual disturbance.  Gastrointestinal: Negative for nausea and vomiting.  Skin: Positive for rash (left lower eyelid).     Social History   Tobacco Use  Smoking Status Never Smoker  Smokeless Tobacco Never Used       Objective:     Wt Readings from Last 3 Encounters:  10/06/14 278 lb (126.1 kg)  09/27/13 259 lb 6.4 oz (117.7 kg)  05/10/13 253 lb (114.8 kg)     Exam deferred. Pt. Harboring due to COVID 19. Phone visit performed.   Assessment & Plan:   1. Hordeolum externum of left upper eyelid     Meds ordered this encounter  Medications  . tobramycin-dexamethasone (TOBRADEX) ophthalmic solution    Sig: Apply 1 drop in affected eye(s) every 2 hours for two days. Then every 4 hours for 5 days.    Dispense:  5 mL    Refill:  0    No orders of the defined types were placed in this encounter.     Diagnoses and all orders for this visit:  Hordeolum externum of left upper eyelid  Other orders -      tobramycin-dexamethasone (TOBRADEX) ophthalmic solution; Apply 1 drop in affected eye(s) every 2 hours for two days. Then every 4 hours for 5 days.    Virtual Visit via telephone Note  I discussed the limitations, risks, security and privacy concerns of performing an evaluation and management service by telephone and the availability of in person appointments. The patient was identified with two identifiers. Pt.expressed understanding and agreed to proceed. Pt. Is at home. History given by her daughter, Megan Fisher. Dr. Livia Snellen is in his office.  Follow Up Instructions:   I discussed the assessment and treatment plan with the patient. The patient was provided an opportunity to ask questions and all were answered. The patient agreed with the plan and demonstrated an understanding of the instructions.   The patient was advised to call back or seek an in-person evaluation if the symptoms worsen or if the condition fails to improve as anticipated.   Total minutes including chart review and phone contact time: 14   Follow up plan: Return if symptoms worsen or fail to improve.  Claretta Fraise, MD [place]

## 2018-10-20 ENCOUNTER — Telehealth: Payer: Self-pay | Admitting: *Deleted

## 2018-10-20 NOTE — Telephone Encounter (Signed)
Please send the order.  

## 2018-10-20 NOTE — Telephone Encounter (Signed)
Michelle at [facility]:  She called and said the pt has reached a plateau and they would like a verbal order to decrease PT to once a week for 4 weeks.

## 2018-10-20 NOTE — Telephone Encounter (Signed)
Left detailed message for PT

## 2018-10-21 DIAGNOSIS — I69354 Hemiplegia and hemiparesis following cerebral infarction affecting left non-dominant side: Secondary | ICD-10-CM | POA: Diagnosis not present

## 2018-10-21 DIAGNOSIS — I110 Hypertensive heart disease with heart failure: Secondary | ICD-10-CM | POA: Diagnosis not present

## 2018-10-21 DIAGNOSIS — M19041 Primary osteoarthritis, right hand: Secondary | ICD-10-CM | POA: Diagnosis not present

## 2018-10-21 DIAGNOSIS — I5032 Chronic diastolic (congestive) heart failure: Secondary | ICD-10-CM | POA: Diagnosis not present

## 2018-10-21 DIAGNOSIS — M170 Bilateral primary osteoarthritis of knee: Secondary | ICD-10-CM | POA: Diagnosis not present

## 2018-10-21 DIAGNOSIS — M24542 Contracture, left hand: Secondary | ICD-10-CM | POA: Diagnosis not present

## 2018-10-22 DIAGNOSIS — M24542 Contracture, left hand: Secondary | ICD-10-CM | POA: Diagnosis not present

## 2018-10-22 DIAGNOSIS — M19041 Primary osteoarthritis, right hand: Secondary | ICD-10-CM | POA: Diagnosis not present

## 2018-10-22 DIAGNOSIS — M170 Bilateral primary osteoarthritis of knee: Secondary | ICD-10-CM | POA: Diagnosis not present

## 2018-10-22 DIAGNOSIS — I110 Hypertensive heart disease with heart failure: Secondary | ICD-10-CM | POA: Diagnosis not present

## 2018-10-22 DIAGNOSIS — I5032 Chronic diastolic (congestive) heart failure: Secondary | ICD-10-CM | POA: Diagnosis not present

## 2018-10-22 DIAGNOSIS — I69354 Hemiplegia and hemiparesis following cerebral infarction affecting left non-dominant side: Secondary | ICD-10-CM | POA: Diagnosis not present

## 2018-10-23 DIAGNOSIS — Z6841 Body Mass Index (BMI) 40.0 and over, adult: Secondary | ICD-10-CM | POA: Diagnosis not present

## 2018-10-23 DIAGNOSIS — F339 Major depressive disorder, recurrent, unspecified: Secondary | ICD-10-CM | POA: Diagnosis not present

## 2018-10-23 DIAGNOSIS — I5032 Chronic diastolic (congestive) heart failure: Secondary | ICD-10-CM | POA: Diagnosis not present

## 2018-10-23 DIAGNOSIS — M170 Bilateral primary osteoarthritis of knee: Secondary | ICD-10-CM | POA: Diagnosis not present

## 2018-10-23 DIAGNOSIS — I69354 Hemiplegia and hemiparesis following cerebral infarction affecting left non-dominant side: Secondary | ICD-10-CM | POA: Diagnosis not present

## 2018-10-23 DIAGNOSIS — I110 Hypertensive heart disease with heart failure: Secondary | ICD-10-CM | POA: Diagnosis not present

## 2018-10-23 DIAGNOSIS — E669 Obesity, unspecified: Secondary | ICD-10-CM | POA: Diagnosis not present

## 2018-10-23 DIAGNOSIS — J45909 Unspecified asthma, uncomplicated: Secondary | ICD-10-CM | POA: Diagnosis not present

## 2018-10-23 DIAGNOSIS — M19041 Primary osteoarthritis, right hand: Secondary | ICD-10-CM | POA: Diagnosis not present

## 2018-10-23 DIAGNOSIS — F419 Anxiety disorder, unspecified: Secondary | ICD-10-CM | POA: Diagnosis not present

## 2018-10-23 DIAGNOSIS — M24542 Contracture, left hand: Secondary | ICD-10-CM | POA: Diagnosis not present

## 2018-10-23 DIAGNOSIS — D519 Vitamin B12 deficiency anemia, unspecified: Secondary | ICD-10-CM | POA: Diagnosis not present

## 2018-10-23 DIAGNOSIS — K219 Gastro-esophageal reflux disease without esophagitis: Secondary | ICD-10-CM | POA: Diagnosis not present

## 2018-10-23 DIAGNOSIS — I739 Peripheral vascular disease, unspecified: Secondary | ICD-10-CM | POA: Diagnosis not present

## 2018-10-23 DIAGNOSIS — F39 Unspecified mood [affective] disorder: Secondary | ICD-10-CM | POA: Diagnosis not present

## 2018-10-26 DIAGNOSIS — M170 Bilateral primary osteoarthritis of knee: Secondary | ICD-10-CM | POA: Diagnosis not present

## 2018-10-26 DIAGNOSIS — M24542 Contracture, left hand: Secondary | ICD-10-CM | POA: Diagnosis not present

## 2018-10-26 DIAGNOSIS — I110 Hypertensive heart disease with heart failure: Secondary | ICD-10-CM | POA: Diagnosis not present

## 2018-10-26 DIAGNOSIS — I5032 Chronic diastolic (congestive) heart failure: Secondary | ICD-10-CM | POA: Diagnosis not present

## 2018-10-26 DIAGNOSIS — I69354 Hemiplegia and hemiparesis following cerebral infarction affecting left non-dominant side: Secondary | ICD-10-CM | POA: Diagnosis not present

## 2018-10-26 DIAGNOSIS — M19041 Primary osteoarthritis, right hand: Secondary | ICD-10-CM | POA: Diagnosis not present

## 2018-10-27 ENCOUNTER — Telehealth: Payer: Self-pay | Admitting: Family Medicine

## 2018-10-27 DIAGNOSIS — I5032 Chronic diastolic (congestive) heart failure: Secondary | ICD-10-CM | POA: Diagnosis not present

## 2018-10-27 DIAGNOSIS — I110 Hypertensive heart disease with heart failure: Secondary | ICD-10-CM | POA: Diagnosis not present

## 2018-10-27 DIAGNOSIS — M170 Bilateral primary osteoarthritis of knee: Secondary | ICD-10-CM | POA: Diagnosis not present

## 2018-10-27 DIAGNOSIS — I69354 Hemiplegia and hemiparesis following cerebral infarction affecting left non-dominant side: Secondary | ICD-10-CM | POA: Diagnosis not present

## 2018-10-27 DIAGNOSIS — M19041 Primary osteoarthritis, right hand: Secondary | ICD-10-CM | POA: Diagnosis not present

## 2018-10-27 DIAGNOSIS — M24542 Contracture, left hand: Secondary | ICD-10-CM | POA: Diagnosis not present

## 2018-10-27 NOTE — Telephone Encounter (Signed)
Rx is on your desk- please sign and have the nurse you are working with 10/28/18 fax for patient.

## 2018-10-27 NOTE — Telephone Encounter (Signed)
Will do, thanks

## 2018-10-27 NOTE — Telephone Encounter (Signed)
Please order as requested

## 2018-11-03 ENCOUNTER — Ambulatory Visit: Payer: Medicare Other | Admitting: Family Medicine

## 2018-11-03 ENCOUNTER — Other Ambulatory Visit: Payer: Self-pay

## 2018-11-03 DIAGNOSIS — R0689 Other abnormalities of breathing: Secondary | ICD-10-CM | POA: Diagnosis not present

## 2018-11-03 DIAGNOSIS — I110 Hypertensive heart disease with heart failure: Secondary | ICD-10-CM | POA: Diagnosis not present

## 2018-11-03 DIAGNOSIS — R0902 Hypoxemia: Secondary | ICD-10-CM | POA: Diagnosis not present

## 2018-11-03 DIAGNOSIS — I5030 Unspecified diastolic (congestive) heart failure: Secondary | ICD-10-CM | POA: Diagnosis not present

## 2018-11-03 DIAGNOSIS — B9620 Unspecified Escherichia coli [E. coli] as the cause of diseases classified elsewhere: Secondary | ICD-10-CM | POA: Diagnosis not present

## 2018-11-03 DIAGNOSIS — I10 Essential (primary) hypertension: Secondary | ICD-10-CM | POA: Diagnosis not present

## 2018-11-03 DIAGNOSIS — W06XXXA Fall from bed, initial encounter: Secondary | ICD-10-CM | POA: Diagnosis not present

## 2018-11-03 DIAGNOSIS — S7012XA Contusion of left thigh, initial encounter: Secondary | ICD-10-CM | POA: Diagnosis not present

## 2018-11-03 DIAGNOSIS — N390 Urinary tract infection, site not specified: Secondary | ICD-10-CM | POA: Diagnosis not present

## 2018-11-03 DIAGNOSIS — M79605 Pain in left leg: Secondary | ICD-10-CM | POA: Diagnosis not present

## 2018-11-03 DIAGNOSIS — R600 Localized edema: Secondary | ICD-10-CM | POA: Diagnosis not present

## 2018-11-03 DIAGNOSIS — R52 Pain, unspecified: Secondary | ICD-10-CM | POA: Diagnosis not present

## 2018-11-03 DIAGNOSIS — S8012XA Contusion of left lower leg, initial encounter: Secondary | ICD-10-CM | POA: Diagnosis not present

## 2018-11-03 DIAGNOSIS — I5032 Chronic diastolic (congestive) heart failure: Secondary | ICD-10-CM | POA: Diagnosis not present

## 2018-11-03 DIAGNOSIS — Z209 Contact with and (suspected) exposure to unspecified communicable disease: Secondary | ICD-10-CM | POA: Diagnosis not present

## 2018-11-03 DIAGNOSIS — N3001 Acute cystitis with hematuria: Secondary | ICD-10-CM | POA: Diagnosis not present

## 2018-11-03 DIAGNOSIS — Z6841 Body Mass Index (BMI) 40.0 and over, adult: Secondary | ICD-10-CM | POA: Diagnosis not present

## 2018-11-03 DIAGNOSIS — M79652 Pain in left thigh: Secondary | ICD-10-CM | POA: Diagnosis not present

## 2018-11-05 DIAGNOSIS — Z7401 Bed confinement status: Secondary | ICD-10-CM | POA: Diagnosis not present

## 2018-11-05 DIAGNOSIS — M1990 Unspecified osteoarthritis, unspecified site: Secondary | ICD-10-CM | POA: Diagnosis present

## 2018-11-05 DIAGNOSIS — I959 Hypotension, unspecified: Secondary | ICD-10-CM | POA: Diagnosis not present

## 2018-11-05 DIAGNOSIS — I110 Hypertensive heart disease with heart failure: Secondary | ICD-10-CM | POA: Diagnosis present

## 2018-11-05 DIAGNOSIS — M79652 Pain in left thigh: Secondary | ICD-10-CM | POA: Diagnosis not present

## 2018-11-05 DIAGNOSIS — R531 Weakness: Secondary | ICD-10-CM | POA: Diagnosis not present

## 2018-11-05 DIAGNOSIS — B9620 Unspecified Escherichia coli [E. coli] as the cause of diseases classified elsewhere: Secondary | ICD-10-CM | POA: Diagnosis present

## 2018-11-05 DIAGNOSIS — N390 Urinary tract infection, site not specified: Secondary | ICD-10-CM | POA: Diagnosis present

## 2018-11-05 DIAGNOSIS — Z880 Allergy status to penicillin: Secondary | ICD-10-CM | POA: Diagnosis not present

## 2018-11-05 DIAGNOSIS — S7012XA Contusion of left thigh, initial encounter: Secondary | ICD-10-CM | POA: Diagnosis present

## 2018-11-05 DIAGNOSIS — K219 Gastro-esophageal reflux disease without esophagitis: Secondary | ICD-10-CM | POA: Diagnosis present

## 2018-11-05 DIAGNOSIS — I5032 Chronic diastolic (congestive) heart failure: Secondary | ICD-10-CM | POA: Diagnosis present

## 2018-11-05 DIAGNOSIS — Z6841 Body Mass Index (BMI) 40.0 and over, adult: Secondary | ICD-10-CM | POA: Diagnosis not present

## 2018-11-05 DIAGNOSIS — E669 Obesity, unspecified: Secondary | ICD-10-CM | POA: Diagnosis present

## 2018-11-05 DIAGNOSIS — F418 Other specified anxiety disorders: Secondary | ICD-10-CM | POA: Diagnosis present

## 2018-11-09 ENCOUNTER — Telehealth: Payer: Self-pay | Admitting: Family Medicine

## 2018-11-09 NOTE — Telephone Encounter (Signed)
appt scheduled Pt notified 

## 2018-11-10 ENCOUNTER — Other Ambulatory Visit: Payer: Self-pay

## 2018-11-10 ENCOUNTER — Ambulatory Visit (INDEPENDENT_AMBULATORY_CARE_PROVIDER_SITE_OTHER): Payer: Medicare Other | Admitting: Family Medicine

## 2018-11-10 ENCOUNTER — Emergency Department (HOSPITAL_COMMUNITY): Payer: Medicare Other

## 2018-11-10 ENCOUNTER — Emergency Department (HOSPITAL_COMMUNITY)
Admission: EM | Admit: 2018-11-10 | Discharge: 2018-11-11 | Disposition: A | Discharge status: Home / Self Care | Payer: Medicare Other | Attending: Emergency Medicine | Admitting: Emergency Medicine

## 2018-11-10 ENCOUNTER — Encounter (HOSPITAL_COMMUNITY): Payer: Self-pay | Admitting: Emergency Medicine

## 2018-11-10 DIAGNOSIS — D649 Anemia, unspecified: Secondary | ICD-10-CM

## 2018-11-10 DIAGNOSIS — R918 Other nonspecific abnormal finding of lung field: Secondary | ICD-10-CM | POA: Diagnosis not present

## 2018-11-10 DIAGNOSIS — Z79899 Other long term (current) drug therapy: Secondary | ICD-10-CM | POA: Insufficient documentation

## 2018-11-10 DIAGNOSIS — R0902 Hypoxemia: Secondary | ICD-10-CM | POA: Diagnosis not present

## 2018-11-10 DIAGNOSIS — R062 Wheezing: Secondary | ICD-10-CM | POA: Diagnosis not present

## 2018-11-10 DIAGNOSIS — Z5329 Procedure and treatment not carried out because of patient's decision for other reasons: Secondary | ICD-10-CM

## 2018-11-10 DIAGNOSIS — R410 Disorientation, unspecified: Secondary | ICD-10-CM

## 2018-11-10 DIAGNOSIS — F19921 Other psychoactive substance use, unspecified with intoxication with delirium: Secondary | ICD-10-CM | POA: Diagnosis not present

## 2018-11-10 DIAGNOSIS — I10 Essential (primary) hypertension: Secondary | ICD-10-CM | POA: Diagnosis not present

## 2018-11-10 DIAGNOSIS — R0602 Shortness of breath: Secondary | ICD-10-CM | POA: Diagnosis not present

## 2018-11-10 DIAGNOSIS — R197 Diarrhea, unspecified: Secondary | ICD-10-CM | POA: Diagnosis not present

## 2018-11-10 DIAGNOSIS — T402X5A Adverse effect of other opioids, initial encounter: Secondary | ICD-10-CM | POA: Diagnosis not present

## 2018-11-10 DIAGNOSIS — Z209 Contact with and (suspected) exposure to unspecified communicable disease: Secondary | ICD-10-CM | POA: Diagnosis not present

## 2018-11-10 DIAGNOSIS — I959 Hypotension, unspecified: Secondary | ICD-10-CM | POA: Diagnosis not present

## 2018-11-10 LAB — CBC WITH DIFFERENTIAL/PLATELET
Abs Immature Granulocytes: 0.02 10*3/uL (ref 0.00–0.07)
Basophils Absolute: 0 10*3/uL (ref 0.0–0.1)
Basophils Relative: 0 %
Eosinophils Absolute: 0.2 10*3/uL (ref 0.0–0.5)
Eosinophils Relative: 3 %
HCT: 32.7 % — ABNORMAL LOW (ref 36.0–46.0)
Hemoglobin: 10.7 g/dL — ABNORMAL LOW (ref 12.0–15.0)
Immature Granulocytes: 0 %
Lymphocytes Relative: 27 %
Lymphs Abs: 2.3 10*3/uL (ref 0.7–4.0)
MCH: 30.6 pg (ref 26.0–34.0)
MCHC: 32.7 g/dL (ref 30.0–36.0)
MCV: 93.4 fL (ref 80.0–100.0)
Monocytes Absolute: 0.6 10*3/uL (ref 0.1–1.0)
Monocytes Relative: 7 %
Neutro Abs: 5.2 10*3/uL (ref 1.7–7.7)
Neutrophils Relative %: 63 %
Platelets: 241 10*3/uL (ref 150–400)
RBC: 3.5 MIL/uL — ABNORMAL LOW (ref 3.87–5.11)
RDW: 12.7 % (ref 11.5–15.5)
WBC: 8.3 10*3/uL (ref 4.0–10.5)
nRBC: 0 % (ref 0.0–0.2)

## 2018-11-10 LAB — URINALYSIS, ROUTINE W REFLEX MICROSCOPIC
Bilirubin Urine: NEGATIVE
Glucose, UA: NEGATIVE mg/dL
Hgb urine dipstick: NEGATIVE
Ketones, ur: NEGATIVE mg/dL
Leukocytes,Ua: NEGATIVE
Nitrite: NEGATIVE
Protein, ur: NEGATIVE mg/dL
Specific Gravity, Urine: 1.017 (ref 1.005–1.030)
pH: 7 (ref 5.0–8.0)

## 2018-11-10 LAB — COMPREHENSIVE METABOLIC PANEL
ALT: 11 U/L (ref 0–44)
AST: 17 U/L (ref 15–41)
Albumin: 3.1 g/dL — ABNORMAL LOW (ref 3.5–5.0)
Alkaline Phosphatase: 63 U/L (ref 38–126)
Anion gap: 6 (ref 5–15)
BUN: 16 mg/dL (ref 8–23)
CO2: 29 mmol/L (ref 22–32)
Calcium: 8.5 mg/dL — ABNORMAL LOW (ref 8.9–10.3)
Chloride: 103 mmol/L (ref 98–111)
Creatinine, Ser: 0.74 mg/dL (ref 0.44–1.00)
GFR calc Af Amer: >60 mL/min (ref >60)
GFR calc non Af Amer: >60 mL/min (ref >60)
Glucose, Bld: 124 mg/dL — ABNORMAL HIGH (ref 70–99)
Potassium: 3.9 mmol/L (ref 3.5–5.1)
Sodium: 138 mmol/L (ref 135–145)
Total Bilirubin: 1.2 mg/dL (ref 0.3–1.2)
Total Protein: 6 g/dL — ABNORMAL LOW (ref 6.5–8.1)

## 2018-11-10 LAB — POC OCCULT BLOOD, ED: Fecal Occult Bld: NEGATIVE

## 2018-11-10 LAB — CK: Total CK: 259 U/L — ABNORMAL HIGH (ref 38–234)

## 2018-11-10 MED ORDER — ALBUTEROL SULFATE HFA 108 (90 BASE) MCG/ACT IN AERS
2.0000 | INHALATION_SPRAY | Freq: Once | RESPIRATORY_TRACT | Status: AC
  Administered 2018-11-10: 2 via RESPIRATORY_TRACT
  Filled 2018-11-10: qty 6.7

## 2018-11-10 NOTE — Progress Notes (Signed)
Telephone visit  Subjective: CC: hospital follow up  Attempted to reach at [phone] [facility]): 9:16am (LVM); 9:25am; 9:33am (LVM) Tried other numbers on file but no answer.  Needs to reschedule.  Janora Norlander, DO [place] [phone]

## 2018-11-10 NOTE — ED Provider Notes (Addendum)
[facility] Provider Note   CSN: [id number] Arrival date & time: 11/10/18  2030    History   Chief Complaint Chief Complaint  Patient presents with  . Weakness    HPI Megan Fisher is a 85 y.o. female with a history of HTN, severe arthritis in her knees preventing weight bearing, h/o vertigo who was [facility], admission for a severe left thigh hematoma and uti x 1 week, returned home ytd and was found to be profoundly confused which persists today.  Dg states she has round the clock caregivers, she was sent home with ultram for pain relief but dg withheld this since last night over concern this medicine was the source of the confusion, however the confusion has not cleared today.  She also developed diarrhea, 4 episodes today which caregivers stated smelled like C. Dif.  She has completed the abx for her uti prior to returning home.  She has had no recognized fevers, no vomiting, no focal weakness, also denies sob, cough, no headache, but has generalized fatigue. Has had reduced appetite today but has had snacks including watermelon and ice cream, fluids.       The history is provided by the patient and a relative (daughter at bedside).    Past Medical History:  Diagnosis Date  . Arthritis   . Hernia   . Hypertension   . Obesity   . Vertigo     Patient Active Problem List   Diagnosis Date Noted  . Urine frequency 05/10/2013  . Arthritis   . Hypertension   . Vertigo   . Obesity   . Ventral hernia     Past Surgical History:  Procedure Laterality Date  . BREAST SURGERY    . CHOLECYSTECTOMY    . HERNIA REPAIR    . ooperectomy    . TONSILLECTOMY       OB History   No obstetric history on file.      Home Medications    Prior to Admission medications   Medication Sig Start Date End Date Taking? Authorizing Provider  acetaminophen (TYLENOL) 325 MG tablet Take 650 mg by mouth every 6 (six) hours as needed for mild pain  or moderate pain.    Yes [provider]  azelastine (ASTELIN) 0.1 % nasal spray Place 2 sprays into both nostrils 2 (two) times daily. Use in each nostril as directed 09/21/18  Yes Stacks, Broadus JohnWarren, MD  benazepril (LOTENSIN) 10 MG tablet Take 10 mg by mouth daily.   Yes [provider]  calcium carbonate (OS-CAL - DOSED IN MG OF ELEMENTAL CALCIUM) 1250 (500 Ca) MG tablet Take 1 tablet by mouth 2 (two) times daily with a meal.    Yes [provider]  cholecalciferol (VITAMIN D) 1000 UNITS tablet Take 1,000 Units by mouth daily.   Yes [provider]  Cranberry 450 MG CAPS Take 1 capsule by mouth daily.    Yes [provider]  diclofenac sodium (VOLTAREN) 1 % GEL Apply 2 g topically 4 (four) times daily as needed (for pain).    Yes [provider]  furosemide (LASIX) 20 MG tablet Take 20 mg by mouth daily.    Yes [provider]  loratadine (CLARITIN) 10 MG tablet Take 10 mg by mouth daily.   Yes [provider]  LORazepam (ATIVAN) 0.5 MG tablet Take 1 tablet (0.5 mg total) by mouth at bedtime. 10/05/18  Yes Mechele ClaudeStacks, Warren, MD  meclizine (ANTIVERT) 25  MG tablet TAKE (1) TABLET THREE TIMES DAILY AS NEEDED. Patient taking differently: Take 25 mg by mouth 2 (two) times daily.  12/17/13  Yes Ernestina PennaMoore, Donald W, MD  mometasone (ELOCON) 0.1 % cream Apply 1 application topically daily. To sore on chin 09/21/18  Yes Stacks, Broadus JohnWarren, MD  omeprazole (PRILOSEC) 20 MG capsule Take 20 mg by mouth daily. 09/28/14  Yes [provider]  PARoxetine (PAXIL) 20 MG tablet TAKE 1 TABLET IN MORNING Patient taking differently: Take 20 mg by mouth every morning. TAKE 1 TABLET IN MORNING 09/21/18  Yes Stacks, Broadus JohnWarren, MD  promethazine (PHENERGAN) 25 MG tablet Take 25 mg by mouth every 6 (six) hours as needed for nausea or vomiting.   Yes [provider]  vitamin B-12 (CYANOCOBALAMIN) 1000 MCG tablet Take 1,000 mcg by mouth daily.   Yes [provider]  tobramycin-dexamethasone Wallene Dales(TOBRADEX) ophthalmic solution Apply 1 drop in affected eye(s) every 2 hours for two days. Then every 4 hours for 5 days. Patient not taking: Reported on 11/10/2018 10/19/18   Mechele ClaudeStacks, Warren, MD  traZODone (DESYREL) 150 MG tablet Use from 1/3 to 1 tablet nightly as needed for sleep. Patient not taking: Reported on 11/10/2018 09/21/18   Mechele ClaudeStacks, Warren, MD    Family History No family history on file.  Social History Social History   Tobacco Use  . Smoking status: Never Smoker  . Smokeless tobacco: Never Used  Substance Use Topics  . Alcohol use: No  . Drug use: No     Allergies   Propoxyphene, Citalopram, Codeine, Levaquin [levofloxacin in d5w], Oxycontin [oxycodone hcl], Ultram [tramadol], Zoloft [sertraline hcl], Aspirin, and Penicillins   Review of Systems Review of Systems  Constitutional: Positive for fatigue. Negative for appetite change, chills and fever.  HENT: Negative.   Respiratory: Negative.  Negative for cough and shortness of breath.   Cardiovascular: Negative.  Negative for chest pain.  Gastrointestinal: Positive for diarrhea. Negative for nausea and vomiting.  Genitourinary: Negative.  Negative for dysuria.  Musculoskeletal: Negative.   Skin: Negative.   Neurological: Negative for headaches.     Physical Exam Updated Vital Signs BP 101/71   Pulse 78   Temp 98.3 F (36.8 C) (Oral)   Resp 19   SpO2 100%   Physical Exam Vitals signs and nursing note reviewed.  Constitutional:      Appearance: She is well-developed. She is obese. She is not toxic-appearing.     Comments: Pleasantly confused. Cooperative.  HENT:     Head: Normocephalic and atraumatic.     Nose: No congestion.     Mouth/Throat:     Mouth: Mucous membranes are moist.  Eyes:     Conjunctiva/sclera: Conjunctivae normal.  Neck:     Musculoskeletal: Normal range of motion.  Cardiovascular:     Rate and Rhythm: Normal rate and regular rhythm.      Pulses: Normal pulses.          Radial pulses are 2+ on the right side and 2+ on the left side.       Dorsalis pedis pulses are 2+ on the right side and 2+ on the left side.     Heart sounds: Normal heart sounds.  Pulmonary:     Effort: Pulmonary effort is normal.     Breath sounds: Normal breath sounds. No wheezing.  Abdominal:     General: Bowel sounds are normal. There is no distension.     Palpations: Abdomen is soft.     Tenderness:  There is no abdominal tenderness. There is no guarding or rebound.  Musculoskeletal: Normal range of motion.     Comments: Chronic contracture of left hand.   Skin:    General: Skin is warm and dry.  Neurological:     General: No focal deficit present.     Mental Status: She is alert.     Cranial Nerves: Cranial nerves are intact. No cranial nerve deficit or facial asymmetry.     Sensory: Sensation is intact. No sensory deficit.     Motor: Motor function is intact.     Comments: Oriented to person and place, not to time.  Cooperative for exam.       ED Treatments / Results  Labs (all labs ordered are listed, but only abnormal results are displayed) Labs Reviewed  CBC WITH DIFFERENTIAL/PLATELET - Abnormal; Notable for the following components:      Result Value   RBC 3.50 (*)    Hemoglobin 10.7 (*)    HCT 32.7 (*)    All other components within normal limits  COMPREHENSIVE METABOLIC PANEL - Abnormal; Notable for the following components:   Glucose, Bld 124 (*)    Calcium 8.5 (*)    Total Protein 6.0 (*)    Albumin 3.1 (*)    All other components within normal limits  CK - Abnormal; Notable for the following components:   Total CK 259 (*)    All other components within normal limits  C DIFFICILE QUICK SCREEN W PCR REFLEX  URINALYSIS, ROUTINE W REFLEX MICROSCOPIC  POC OCCULT BLOOD, ED    EKG None  Radiology Dg Chest 2 View  Result Date: 11/11/2018 CLINICAL DATA:  Altered mental status.  Left basilar opacity. EXAM: CHEST - 2 VIEW  COMPARISON:  AP chest radiograph earlier the same day FINDINGS: Lateral view shows no retrocardiac opacity. There is a right chest wall Port-A-Cath with its tip projecting over the lower SVC. Mild cardiomegaly. Lungs are clear. IMPRESSION: Clear lungs.  Lateral view shows no retrocardiac opacity. Electronically Signed   By: Deatra RobinsonKevin  Herman M.D.   On: 11/11/2018 00:45   Dg Chest Portable 1 View  Result Date: 11/10/2018 CLINICAL DATA:  Wheezing. Confusion weakness EXAM: PORTABLE CHEST 1 VIEW COMPARISON:  05/11/2015 FINDINGS: Right chest port with tip in the mid SVC. Low lung volumes limit assessment. Question of ill-defined retrocardiac opacity versus soft tissue attenuation from habitus. Bronchovascular crowding. No pulmonary edema. No pneumothorax. IMPRESSION: Low lung volumes with bronchovascular crowding. Questionable retrocardiac opacity versus soft tissue attenuation. Consider frontal and lateral views when patient is able. Electronically Signed   By: Narda RutherfordMelanie  Sanford M.D.   On: 11/10/2018 22:59    Procedures Procedures (including critical care time)  Medications Ordered in ED Medications  albuterol (VENTOLIN HFA) 108 (90 Base) MCG/ACT inhaler 2 puff (2 puffs Inhalation Given 11/10/18 2249)     Initial Impression / Assessment and Plan / ED Course  I have reviewed the triage vital signs and the nursing notes.  Pertinent labs & imaging results that were available during my care of the patient were reviewed by me and considered in my medical decision making (see chart for details).        Patient with recent hospitalization for a large hematoma in her left posterior thigh, home since yesterday evening with increased confusion per caregivers and daughter.  No fevers, chest x-ray is clear, UA is negative for infection.  Labs are stable.  She had been discharged home with Ultram which has  been held since yesterday evening.  It is possible she has confusion secondary to this medication.  She was  felt to be stable for discharge home as she has around-the-clock caregivers and the risk of admission outweighs the benefit of being at home. patient and daughter was advised to continue holding the Ultram which may be the whole source of the patient's confusion.  Plan follow-up with her primary doctor for recheck if her symptoms are not clearing over the next several days.  Also discussed patient's significant change in hemoglobin from 15.7-10.7 over the course of the past 30 days.  This possibly is from the recent hospitalization and the large hematoma.  However she should follow-up with her PCP for a repeat blood draw to confirm that this numbers not continuing to drop lower.  She was Hemoccult negative here.  Patient was seen by Dr. Lacinda Axon during this ED visit.  Final Clinical Impressions(s) / ED Diagnoses   Final diagnoses:  Confusion caused by a drug ([place])  Anemia, unspecified type    ED Discharge Orders    None       Landis Martins 11/11/18 0054    Evalee Jefferson, PA-C 11/11/18 0115    Nat Christen, MD 11/11/18 [phone]

## 2018-11-10 NOTE — ED Triage Notes (Signed)
Pt was discharged yesterday from [facility] to home with home health. Pt daughter called EMS saying that she was concerned that the patient is weak and having diarrhea, concern for c-diff. Also daughter told EMS that pt seemed to be confused. Pt A&O on arrival and has no new complaints since being discharged home yesterday.

## 2018-11-11 DIAGNOSIS — Z209 Contact with and (suspected) exposure to unspecified communicable disease: Secondary | ICD-10-CM | POA: Diagnosis not present

## 2018-11-11 DIAGNOSIS — R4182 Altered mental status, unspecified: Secondary | ICD-10-CM | POA: Diagnosis not present

## 2018-11-11 DIAGNOSIS — R918 Other nonspecific abnormal finding of lung field: Secondary | ICD-10-CM | POA: Diagnosis not present

## 2018-11-11 DIAGNOSIS — Z7401 Bed confinement status: Secondary | ICD-10-CM | POA: Diagnosis not present

## 2018-11-11 NOTE — Discharge Instructions (Addendum)
Your exam and lab tests as well as your chest x-ray are reassuring today.  Continue to hold the Ultram pain medication which may be the source of confusion.  Your hemoglobin is low today at 10.7 and may be the results of the recent large hematoma that you were treated for last week.  However you should have your hemoglobin rechecked by your primary doctor within the next several weeks to ensure it is not continuing to drop.  Also I recommend a follow-up with him if the confusion persists despite holding this Ultram over the next several days.

## 2018-11-12 ENCOUNTER — Other Ambulatory Visit: Payer: Self-pay | Admitting: *Deleted

## 2018-11-12 ENCOUNTER — Telehealth: Payer: Self-pay | Admitting: Family Medicine

## 2018-11-12 DIAGNOSIS — M24542 Contracture, left hand: Secondary | ICD-10-CM | POA: Diagnosis not present

## 2018-11-12 DIAGNOSIS — I110 Hypertensive heart disease with heart failure: Secondary | ICD-10-CM | POA: Diagnosis not present

## 2018-11-12 DIAGNOSIS — M19041 Primary osteoarthritis, right hand: Secondary | ICD-10-CM | POA: Diagnosis not present

## 2018-11-12 DIAGNOSIS — I69354 Hemiplegia and hemiparesis following cerebral infarction affecting left non-dominant side: Secondary | ICD-10-CM | POA: Diagnosis not present

## 2018-11-12 DIAGNOSIS — M170 Bilateral primary osteoarthritis of knee: Secondary | ICD-10-CM | POA: Diagnosis not present

## 2018-11-12 DIAGNOSIS — I5032 Chronic diastolic (congestive) heart failure: Secondary | ICD-10-CM | POA: Diagnosis not present

## 2018-11-13 ENCOUNTER — Encounter: Payer: Self-pay | Admitting: Physician Assistant

## 2018-11-13 ENCOUNTER — Ambulatory Visit (INDEPENDENT_AMBULATORY_CARE_PROVIDER_SITE_OTHER): Payer: Medicare Other | Admitting: Physician Assistant

## 2018-11-13 DIAGNOSIS — M19041 Primary osteoarthritis, right hand: Secondary | ICD-10-CM | POA: Diagnosis not present

## 2018-11-13 DIAGNOSIS — I110 Hypertensive heart disease with heart failure: Secondary | ICD-10-CM | POA: Diagnosis not present

## 2018-11-13 DIAGNOSIS — I69354 Hemiplegia and hemiparesis following cerebral infarction affecting left non-dominant side: Secondary | ICD-10-CM | POA: Diagnosis not present

## 2018-11-13 DIAGNOSIS — M170 Bilateral primary osteoarthritis of knee: Secondary | ICD-10-CM | POA: Diagnosis not present

## 2018-11-13 DIAGNOSIS — R197 Diarrhea, unspecified: Secondary | ICD-10-CM | POA: Diagnosis not present

## 2018-11-13 DIAGNOSIS — I5032 Chronic diastolic (congestive) heart failure: Secondary | ICD-10-CM | POA: Diagnosis not present

## 2018-11-13 DIAGNOSIS — M24542 Contracture, left hand: Secondary | ICD-10-CM | POA: Diagnosis not present

## 2018-11-13 NOTE — Progress Notes (Signed)
Telephone visit  Subjective: CC: Diarrhea PCP: Claretta Fraise, MD IOX:[id number] Megan Fisher is a 85 y.o. female calls for telephone consult today. Patient provides verbal consent for consult held via phone.  Patient is identified with 2 separate identifiers.  At this time the entire area is on COVID-19 social distancing and stay home orders are in place.  Patient is of higher risk and therefore we are performing this by a virtual method.  Location of patient: Home Location of provider: HOME Others present for call: Son, Dynasia Kercheval   The patient's son, Megan Fisher, gave me the history by telephone.  The patient was seen at [facility] in For past 1 week due to urinary tract infection.  They also have given her some pain medication and she was because of that time.  She is a little bit clear however she is having significant diarrhea at this time.  She did go to any [facility] emergency department and all of her labs checked out whenever she was having the confusion.  However her about 1 week there has been a significant amount of diarrhea.  She has had C. difficile infection in the past.  Am sure many antibiotics were given through her admission for urinary tract infection.  I am unable to see the note because she is not my patient normally and the [facility] hospital notes, on paper.  I am walking home today.  The history is obtained from [name].  ROS: Per HPI  Allergies  Allergen Reactions  . Propoxyphene Anaphylaxis  . Citalopram Other (See Comments)    Reaction is unknown  . Codeine Other (See Comments)    Altered mental status "Makes Crazy"  . Levaquin [Levofloxacin In D5w] Diarrhea  . Oxycontin [Oxycodone Hcl] Other (See Comments)    Altered mental status  . Ultram [Tramadol]     AMS  . Zoloft [Sertraline Hcl] Other (See Comments)  . Aspirin Rash  . Penicillins Swelling and Rash   Past Medical History:  Diagnosis Date  . Arthritis   . Hernia   . Hypertension   . Obesity   . Vertigo      Current Outpatient Medications:  .  acetaminophen (TYLENOL) 325 MG tablet, Take 650 mg by mouth every 6 (six) hours as needed for mild pain or moderate pain. , Disp: , Rfl:  .  azelastine (ASTELIN) 0.1 % nasal spray, Place 2 sprays into both nostrils 2 (two) times daily. Use in each nostril as directed, Disp: 30 mL, Rfl: 12 .  benazepril (LOTENSIN) 10 MG tablet, Take 10 mg by mouth daily., Disp: , Rfl:  .  calcium carbonate (OS-CAL - DOSED IN MG OF ELEMENTAL CALCIUM) 1250 (500 Ca) MG tablet, Take 1 tablet by mouth 2 (two) times daily with a meal. , Disp: , Rfl:  .  cholecalciferol (VITAMIN D) 1000 UNITS tablet, Take 1,000 Units by mouth daily., Disp: , Rfl:  .  Cranberry 450 MG CAPS, Take 1 capsule by mouth daily. , Disp: , Rfl:  .  diclofenac sodium (VOLTAREN) 1 % GEL, Apply 2 g topically 4 (four) times daily as needed (for pain). , Disp: , Rfl:  .  furosemide (LASIX) 20 MG tablet, Take 20 mg by mouth daily. , Disp: , Rfl:  .  loratadine (CLARITIN) 10 MG tablet, Take 10 mg by mouth daily., Disp: , Rfl:  .  LORazepam (ATIVAN) 0.5 MG tablet, Take 1 tablet (0.5 mg total) by mouth at bedtime., Disp: 30 tablet,  Rfl: 2 .  meclizine (ANTIVERT) 25 MG tablet, TAKE (1) TABLET THREE TIMES DAILY AS NEEDED. (Patient taking differently: Take 25 mg by mouth 2 (two) times daily. ), Disp: 60 tablet, Rfl: 1 .  mometasone (ELOCON) 0.1 % cream, Apply 1 application topically daily. To sore on chin, Disp: 45 g, Rfl: 5 .  omeprazole (PRILOSEC) 20 MG capsule, Take 20 mg by mouth daily., Disp: , Rfl: 0 .  PARoxetine (PAXIL) 20 MG tablet, TAKE 1 TABLET IN MORNING (Patient taking differently: Take 20 mg by mouth every morning. TAKE 1 TABLET IN MORNING), Disp: 30 tablet, Rfl: 11 .  promethazine (PHENERGAN) 25 MG tablet, Take 25 mg by mouth every 6 (six) hours as needed for nausea or vomiting., Disp: , Rfl:  .  tobramycin-dexamethasone (TOBRADEX) ophthalmic solution, Apply 1 drop in affected eye(s) every 2 hours for two  days. Then every 4 hours for 5 days. (Patient not taking: Reported on 11/10/2018), Disp: 5 mL, Rfl: 0 .  traZODone (DESYREL) 150 MG tablet, Use from 1/3 to 1 tablet nightly as needed for sleep. (Patient not taking: Reported on 11/10/2018), Disp: 30 tablet, Rfl: 5 .  vitamin B-12 (CYANOCOBALAMIN) 1000 MCG tablet, Take 1,000 mcg by mouth daily., Disp: , Rfl:   Assessment/ Plan: 85 y.o. female   1. Diarrhea of presumed infectious origin - Cdiff NAA+O+P+Stool Culture - Clostridium difficile EIA   No follow-ups on file.  Continue all other maintenance medications as listed above.  Start time: 11:38 AM End time: 11:50 AM  No orders of the defined types were placed in this encounter.   Prudy FeelerAngel Savon Bordonaro PA-C [facility] [phone]

## 2018-11-13 NOTE — Telephone Encounter (Signed)
appt scheduled

## 2018-11-16 ENCOUNTER — Other Ambulatory Visit: Payer: Self-pay

## 2018-11-16 ENCOUNTER — Other Ambulatory Visit: Payer: Medicare Other

## 2018-11-16 ENCOUNTER — Telehealth: Payer: Self-pay | Admitting: Family Medicine

## 2018-11-16 NOTE — Telephone Encounter (Signed)
What symptoms do you have? DIARRHEA  How long have you been sick? Since last night  Have you been seen for this problem? no  If your provider decides to give you a prescription, which pharmacy would you like for it to be sent to? [facility]   Patient informed that this information will be sent to the clinical staff for review and that they should receive a follow up call.

## 2018-11-16 NOTE — Telephone Encounter (Signed)
Pt had telephone visit on Friday with [facility]. This is a  Chief Strategy Officer pt

## 2018-11-17 ENCOUNTER — Other Ambulatory Visit: Payer: Self-pay | Admitting: Family Medicine

## 2018-11-17 ENCOUNTER — Telehealth: Payer: Self-pay | Admitting: Family Medicine

## 2018-11-17 DIAGNOSIS — M19041 Primary osteoarthritis, right hand: Secondary | ICD-10-CM | POA: Diagnosis not present

## 2018-11-17 DIAGNOSIS — I69354 Hemiplegia and hemiparesis following cerebral infarction affecting left non-dominant side: Secondary | ICD-10-CM | POA: Diagnosis not present

## 2018-11-17 DIAGNOSIS — I5032 Chronic diastolic (congestive) heart failure: Secondary | ICD-10-CM | POA: Diagnosis not present

## 2018-11-17 DIAGNOSIS — M24542 Contracture, left hand: Secondary | ICD-10-CM | POA: Diagnosis not present

## 2018-11-17 DIAGNOSIS — M170 Bilateral primary osteoarthritis of knee: Secondary | ICD-10-CM | POA: Diagnosis not present

## 2018-11-17 DIAGNOSIS — I110 Hypertensive heart disease with heart failure: Secondary | ICD-10-CM | POA: Diagnosis not present

## 2018-11-17 MED ORDER — MECLIZINE HCL 25 MG PO TABS: 25.0000 mg | ORAL_TABLET | Freq: Two times a day (BID) | ORAL | 0 refills | Status: DC | PRN

## 2018-11-17 MED ORDER — BENAZEPRIL HCL 10 MG PO TABS: 10.0000 mg | ORAL_TABLET | Freq: Every day | ORAL | 1 refills | Status: DC

## 2018-11-17 MED ORDER — OMEPRAZOLE 20 MG PO CPDR: 20.0000 mg | DELAYED_RELEASE_CAPSULE | Freq: Every day | ORAL | 1 refills | Status: DC

## 2018-11-17 NOTE — Telephone Encounter (Signed)
done

## 2018-11-18 ENCOUNTER — Other Ambulatory Visit: Payer: Medicare Other

## 2018-11-18 ENCOUNTER — Encounter (HOSPITAL_COMMUNITY): Payer: Self-pay

## 2018-11-18 ENCOUNTER — Emergency Department (HOSPITAL_COMMUNITY)
Admission: EM | Admit: 2018-11-18 | Discharge: 2018-11-18 | Disposition: A | Discharge status: Home / Self Care | Payer: Medicare Other | Attending: Emergency Medicine | Admitting: Emergency Medicine

## 2018-11-18 ENCOUNTER — Other Ambulatory Visit: Payer: Self-pay

## 2018-11-18 ENCOUNTER — Emergency Department (HOSPITAL_COMMUNITY): Payer: Medicare Other

## 2018-11-18 DIAGNOSIS — I10 Essential (primary) hypertension: Secondary | ICD-10-CM | POA: Diagnosis not present

## 2018-11-18 DIAGNOSIS — R197 Diarrhea, unspecified: Secondary | ICD-10-CM

## 2018-11-18 DIAGNOSIS — M170 Bilateral primary osteoarthritis of knee: Secondary | ICD-10-CM | POA: Diagnosis not present

## 2018-11-18 DIAGNOSIS — R410 Disorientation, unspecified: Secondary | ICD-10-CM | POA: Diagnosis not present

## 2018-11-18 DIAGNOSIS — F05 Delirium due to known physiological condition: Secondary | ICD-10-CM | POA: Diagnosis not present

## 2018-11-18 DIAGNOSIS — Z79899 Other long term (current) drug therapy: Secondary | ICD-10-CM | POA: Diagnosis not present

## 2018-11-18 DIAGNOSIS — I5032 Chronic diastolic (congestive) heart failure: Secondary | ICD-10-CM | POA: Diagnosis not present

## 2018-11-18 DIAGNOSIS — R0902 Hypoxemia: Secondary | ICD-10-CM | POA: Diagnosis not present

## 2018-11-18 DIAGNOSIS — R1084 Generalized abdominal pain: Secondary | ICD-10-CM | POA: Diagnosis not present

## 2018-11-18 DIAGNOSIS — I110 Hypertensive heart disease with heart failure: Secondary | ICD-10-CM | POA: Diagnosis not present

## 2018-11-18 DIAGNOSIS — I69354 Hemiplegia and hemiparesis following cerebral infarction affecting left non-dominant side: Secondary | ICD-10-CM | POA: Diagnosis not present

## 2018-11-18 DIAGNOSIS — M24542 Contracture, left hand: Secondary | ICD-10-CM | POA: Diagnosis not present

## 2018-11-18 DIAGNOSIS — I517 Cardiomegaly: Secondary | ICD-10-CM | POA: Diagnosis not present

## 2018-11-18 DIAGNOSIS — R0989 Other specified symptoms and signs involving the circulatory and respiratory systems: Secondary | ICD-10-CM | POA: Diagnosis not present

## 2018-11-18 DIAGNOSIS — Z7401 Bed confinement status: Secondary | ICD-10-CM | POA: Diagnosis not present

## 2018-11-18 DIAGNOSIS — M19041 Primary osteoarthritis, right hand: Secondary | ICD-10-CM | POA: Diagnosis not present

## 2018-11-18 DIAGNOSIS — R52 Pain, unspecified: Secondary | ICD-10-CM | POA: Diagnosis not present

## 2018-11-18 DIAGNOSIS — R4182 Altered mental status, unspecified: Secondary | ICD-10-CM | POA: Diagnosis not present

## 2018-11-18 DIAGNOSIS — Z209 Contact with and (suspected) exposure to unspecified communicable disease: Secondary | ICD-10-CM | POA: Diagnosis not present

## 2018-11-18 LAB — CBC WITH DIFFERENTIAL/PLATELET
Abs Immature Granulocytes: 0.05 10*3/uL (ref 0.00–0.07)
Basophils Absolute: 0 10*3/uL (ref 0.0–0.1)
Basophils Relative: 0 %
Eosinophils Absolute: 0.2 10*3/uL (ref 0.0–0.5)
Eosinophils Relative: 2 %
HCT: 41.7 % (ref 36.0–46.0)
Hemoglobin: 13.1 g/dL (ref 12.0–15.0)
Immature Granulocytes: 1 %
Lymphocytes Relative: 21 %
Lymphs Abs: 2 10*3/uL (ref 0.7–4.0)
MCH: 29.8 pg (ref 26.0–34.0)
MCHC: 31.4 g/dL (ref 30.0–36.0)
MCV: 95 fL (ref 80.0–100.0)
Monocytes Absolute: 0.5 10*3/uL (ref 0.1–1.0)
Monocytes Relative: 5 %
Neutro Abs: 7.1 10*3/uL (ref 1.7–7.7)
Neutrophils Relative %: 71 %
Platelets: 282 10*3/uL (ref 150–400)
RBC: 4.39 MIL/uL (ref 3.87–5.11)
RDW: 14.1 % (ref 11.5–15.5)
WBC: 10 10*3/uL (ref 4.0–10.5)
nRBC: 0 % (ref 0.0–0.2)

## 2018-11-18 LAB — COMPREHENSIVE METABOLIC PANEL
ALT: 10 U/L (ref 0–44)
AST: 16 U/L (ref 15–41)
Albumin: 3.6 g/dL (ref 3.5–5.0)
Alkaline Phosphatase: 65 U/L (ref 38–126)
Anion gap: 6 (ref 5–15)
BUN: 17 mg/dL (ref 8–23)
CO2: 29 mmol/L (ref 22–32)
Calcium: 9 mg/dL (ref 8.9–10.3)
Chloride: 104 mmol/L (ref 98–111)
Creatinine, Ser: 0.67 mg/dL (ref 0.44–1.00)
GFR calc Af Amer: >60 mL/min (ref >60)
GFR calc non Af Amer: >60 mL/min (ref >60)
Glucose, Bld: 112 mg/dL — ABNORMAL HIGH (ref 70–99)
Potassium: 4.1 mmol/L (ref 3.5–5.1)
Sodium: 139 mmol/L (ref 135–145)
Total Bilirubin: 0.9 mg/dL (ref 0.3–1.2)
Total Protein: 6.9 g/dL (ref 6.5–8.1)

## 2018-11-18 LAB — LIPASE, BLOOD: Lipase: 24 U/L (ref 11–51)

## 2018-11-18 LAB — URINALYSIS, ROUTINE W REFLEX MICROSCOPIC
Bacteria, UA: NONE SEEN
Bilirubin Urine: NEGATIVE
Glucose, UA: NEGATIVE mg/dL
Ketones, ur: NEGATIVE mg/dL
Leukocytes,Ua: NEGATIVE
Nitrite: NEGATIVE
Protein, ur: NEGATIVE mg/dL
Specific Gravity, Urine: 1.005 (ref 1.005–1.030)
pH: 6 (ref 5.0–8.0)

## 2018-11-18 LAB — MAGNESIUM: Magnesium: 2 mg/dL (ref 1.7–2.4)

## 2018-11-18 LAB — AMMONIA: Ammonia: 17 umol/L (ref 9–35)

## 2018-11-18 MED ORDER — METRONIDAZOLE 500 MG PO TABS: 500.0000 mg | ORAL_TABLET | Freq: Two times a day (BID) | ORAL | 0 refills | Status: DC

## 2018-11-18 NOTE — ED Provider Notes (Signed)
[facility] EMERGENCY DEPARTMENT Provider Note   CSN: [id number] Arrival date & time: 11/18/18  1621     History   Chief Complaint Chief Complaint  Patient presents with  . Altered Mental Status    HPI Megan Fisher is a 85 y.o. female.     HPI   Megan Fisher is a 85 y.o. female with PMH of HTN, vertigo who presents to the Emergency Department with her daughter.  pateint's daughter states that she has been having intermittent episodes of confusion and becomes violent at times.  Episodes have been occurring for 1-2 weeks and mainly at night.  [facility] for UTI with IV antibiotics, but not discharged home with antibiotics.  Patient was seen here on 11/10/18 after having multiple episodes of diarrhea and she was recently prescribed Ultram which was thought to be the possible source of the problem, but the daughter reports continued watery stools and confusion.  Daughter had a tele-visit with her mother's PCP and C difficile test was ordered, but stools have been so watery that they have been unable to collect a sample, so she was advised to return to ER for further evaluation for possible sepsis or C difficile infection.  Pt reports urinary frequency, but denies pain, nausea, vomiting, burning with urination, fever, decreased appetite and flank pain.  No other new medications.  No known COVID exposures    Past Medical History:  Diagnosis Date  . Arthritis   . Hernia   . Hypertension   . Obesity   . Vertigo     Patient Active Problem List   Diagnosis Date Noted  . Urine frequency 05/10/2013  . Arthritis   . Hypertension   . Vertigo   . Obesity   . Ventral hernia     Past Surgical History:  Procedure Laterality Date  . BREAST SURGERY    . CHOLECYSTECTOMY    . HERNIA REPAIR    . ooperectomy    . TONSILLECTOMY       OB History   No obstetric history on file.      Home Medications    Prior to Admission medications   Medication Sig Start  Date End Date Taking? Authorizing Provider  acetaminophen (TYLENOL) 325 MG tablet Take 650 mg by mouth every 6 (six) hours as needed for mild pain or moderate pain.     [provider]  azelastine (ASTELIN) 0.1 % nasal spray Place 2 sprays into both nostrils 2 (two) times daily. Use in each nostril as directed 09/21/18   Mechele ClaudeStacks, Warren, MD  benazepril (LOTENSIN) 10 MG tablet Take 1 tablet (10 mg total) by mouth daily. 11/17/18   Mechele ClaudeStacks, Warren, MD  calcium carbonate (OS-CAL - DOSED IN MG OF ELEMENTAL CALCIUM) 1250 (500 Ca) MG tablet Take 1 tablet by mouth 2 (two) times daily with a meal.     [provider]  cholecalciferol (VITAMIN D) 1000 UNITS tablet Take 1,000 Units by mouth daily.    [provider]  Cranberry 450 MG CAPS Take 1 capsule by mouth daily.     [provider]  diclofenac sodium (VOLTAREN) 1 % GEL Apply 2 g topically 4 (four) times daily as needed (for pain).     [provider]  furosemide (LASIX) 20 MG tablet Take 20 mg by mouth daily.     [provider]  loratadine (CLARITIN) 10 MG tablet Take 10 mg by mouth daily.    [provider]  LORazepam (ATIVAN) 0.5 MG tablet Take 1 tablet (0.5 mg total) by mouth at bedtime. 10/05/18   Claretta Fraise, MD  meclizine (ANTIVERT) 25 MG tablet Take 1 tablet (25 mg total) by mouth 2 (two) times daily as needed for dizziness. 11/17/18   Claretta Fraise, MD  mometasone (ELOCON) 0.1 % cream Apply 1 application topically daily. To sore on chin 09/21/18   Claretta Fraise, MD  omeprazole (PRILOSEC) 20 MG capsule Take 1 capsule (20 mg total) by mouth daily. 11/17/18   Claretta Fraise, MD  PARoxetine (PAXIL) 20 MG tablet TAKE 1 TABLET IN MORNING Patient taking differently: Take 20 mg by mouth every morning. TAKE 1 TABLET IN MORNING 09/21/18   Claretta Fraise, MD  promethazine (PHENERGAN) 25 MG tablet Take 25 mg by mouth every 6 (six) hours as needed for nausea or vomiting.    [provider]   tobramycin-dexamethasone [facility]) ophthalmic solution Apply 1 drop in affected eye(s) every 2 hours for two days. Then every 4 hours for 5 days. Patient not taking: Reported on 11/10/2018 10/19/18   Claretta Fraise, MD  traZODone (DESYREL) 150 MG tablet Use from 1/3 to 1 tablet nightly as needed for sleep. Patient not taking: Reported on 11/10/2018 09/21/18   Claretta Fraise, MD  vitamin B-12 (CYANOCOBALAMIN) 1000 MCG tablet Take 1,000 mcg by mouth daily.    [provider]    Family History No family history on file.  Social History Social History   Tobacco Use  . Smoking status: Never Smoker  . Smokeless tobacco: Never Used  Substance Use Topics  . Alcohol use: No  . Drug use: No     Allergies   Propoxyphene, Citalopram, Codeine, Levaquin [levofloxacin in d5w], Oxycontin [oxycodone hcl], Ultram [tramadol], Zoloft [sertraline hcl], Aspirin, and Penicillins   Review of Systems Review of Systems  Constitutional: Negative for appetite change, chills and fever.  HENT: Negative for sore throat.   Respiratory: Negative for cough and chest tightness.   Cardiovascular: Negative for chest pain.  Gastrointestinal: Positive for diarrhea. Negative for abdominal pain, blood in stool, nausea and vomiting.  Genitourinary: Positive for frequency. Negative for decreased urine volume, difficulty urinating, flank pain, pelvic pain and vaginal bleeding.  Musculoskeletal: Negative for arthralgias, back pain and myalgias.  Neurological: Negative for dizziness, syncope, speech difficulty, numbness and headaches.  Psychiatric/Behavioral: Positive for confusion (intermittent confusion). The patient is not nervous/anxious.        Intermittent episodes of violence, mainly in the evenings     Physical Exam Updated Vital Signs BP 122/73 (BP Location: Left Arm)   Pulse 84   Temp 98.8 F (37.1 C) (Oral)   Resp 20   Wt 115.7 kg   SpO2 96%   BMI 45.17 kg/m   Physical Exam Constitutional:       Appearance: Normal appearance. She is not ill-appearing or toxic-appearing.     Comments: Pt is obese  HENT:     Head: Normocephalic.     Mouth/Throat:     Mouth: Mucous membranes are moist.     Pharynx: Oropharynx is clear.  Eyes:     Extraocular Movements: Extraocular movements intact.     Conjunctiva/sclera: Conjunctivae normal.  Neck:     Musculoskeletal: Normal range of motion.  Cardiovascular:     Rate and Rhythm: Normal rate and regular rhythm.     Pulses: Normal pulses.     Heart sounds: Murmur present.  Pulmonary:     Effort: Pulmonary effort is normal.  Breath sounds: Normal breath sounds.  Chest:     Chest wall: No tenderness.  Abdominal:     General: Bowel sounds are normal.     Palpations: Abdomen is soft.     Tenderness: There is no abdominal tenderness. There is no right CVA tenderness, left CVA tenderness or guarding.     Hernia: A hernia is present.     Comments: Palpable umbilical hernia that  is non-tender and spontaneously reduces   Musculoskeletal: Normal range of motion.     Right lower leg: No edema.     Left lower leg: No edema.  Lymphadenopathy:     Cervical: No cervical adenopathy.  Skin:    General: Skin is warm.     Capillary Refill: Capillary refill takes less than 2 seconds.     Findings: No erythema or rash.  Neurological:     General: No focal deficit present.     Mental Status: She is oriented to person, place, and time.     GCS: GCS eye subscore is 4. GCS verbal subscore is 5. GCS motor subscore is 6.     Sensory: Sensation is intact. No sensory deficit.     Motor: Motor function is intact. No weakness.     Comments: CN II-XII intact.  Speech clear.  Answers all questions appropriately.    Psychiatric:        Mood and Affect: Mood normal.        Speech: Speech normal.        Behavior: Behavior is not aggressive or withdrawn.        Thought Content: Thought content normal.        Cognition and Memory: Cognition normal.       ED Treatments / Results  Labs (all labs ordered are listed, but only abnormal results are displayed) Labs Reviewed  URINALYSIS, ROUTINE W REFLEX MICROSCOPIC - Abnormal; Notable for the following components:      Result Value   Color, Urine STRAW (*)    Hgb urine dipstick SMALL (*)    All other components within normal limits  COMPREHENSIVE METABOLIC PANEL - Abnormal; Notable for the following components:   Glucose, Bld 112 (*)    All other components within normal limits  URINE CULTURE  CBC WITH DIFFERENTIAL/PLATELET  LIPASE, BLOOD  MAGNESIUM  AMMONIA    EKG None  Radiology Dg Chest Portable 1 View  Result Date: 11/18/2018 CLINICAL DATA:  Altered mental status with chest congestion. EXAM: PORTABLE CHEST 1 VIEW COMPARISON:  11/11/2018 FINDINGS: 1750 hours. Low lung volumes. The cardio pericardial silhouette is enlarged. Vascular congestion with probable interstitial pulmonary edema noted. No substantial pleural effusion. Bones are diffusely demineralized. Right Port-A-Cath remains in place. Telemetry leads overlie the chest. IMPRESSION: Low volume film with cardiomegaly and vascular congestion. Component of interstitial edema suspected. Electronically Signed   By: Kennith CenterEric  Mansell M.D.   On: 11/18/2018 18:48    Procedures Procedures (including critical care time)  Medications Ordered in ED Medications - No data to display   Initial Impression / Assessment and Plan / ED Course  I have reviewed the triage vital signs and the nursing notes.  Pertinent labs & imaging results that were available during my care of the patient were reviewed by me and considered in my medical decision making (see chart for details).        Pt here on 11/10/18 and workup reviewed.  Pt is pleasant, alert and answers all questions appropriately here.  She does  not appear septic, vitals reviewed.  No diarrhea during stay. Behavior appropriate and pt has been cooperative during stay   Labs and CXR  reassuring today.    Pt also seen by Dr. Juleen ChinaKohut and care plan discussed. Will tx with flagyl.  Pt and daughter agree to plan and close f/u with PCP.  Return precautions discussed.    Final Clinical Impressions(s) / ED Diagnoses   Final diagnoses:  Diarrhea, unspecified type  Sundowning    ED Discharge Orders    None       Pauline Ausriplett, Marta Bouie, PA-C 11/20/18 1841    Raeford RazorKohut, Stephen, MD 11/26/18 [phone]

## 2018-11-18 NOTE — ED Notes (Signed)
Ems here to transport pt home.

## 2018-11-18 NOTE — Discharge Instructions (Addendum)
Take the flagyl as directed until its finished.  Follow-up with her primary doctor for recheck.  Return to ER for any worsening symptoms such as fever, vomiting or bloody stools.

## 2018-11-18 NOTE — ED Triage Notes (Signed)
Ems reports pt recently diagnosed with uti but says wasn't sent home with antibiotics.  Reports ams today and diarrhea for past few days.

## 2018-11-19 ENCOUNTER — Telehealth: Payer: Self-pay | Admitting: Family Medicine

## 2018-11-19 ENCOUNTER — Other Ambulatory Visit: Payer: Medicare Other

## 2018-11-19 DIAGNOSIS — M19041 Primary osteoarthritis, right hand: Secondary | ICD-10-CM | POA: Diagnosis not present

## 2018-11-19 DIAGNOSIS — M170 Bilateral primary osteoarthritis of knee: Secondary | ICD-10-CM | POA: Diagnosis not present

## 2018-11-19 DIAGNOSIS — M24542 Contracture, left hand: Secondary | ICD-10-CM | POA: Diagnosis not present

## 2018-11-19 DIAGNOSIS — I69354 Hemiplegia and hemiparesis following cerebral infarction affecting left non-dominant side: Secondary | ICD-10-CM | POA: Diagnosis not present

## 2018-11-19 DIAGNOSIS — I110 Hypertensive heart disease with heart failure: Secondary | ICD-10-CM | POA: Diagnosis not present

## 2018-11-19 DIAGNOSIS — I5032 Chronic diastolic (congestive) heart failure: Secondary | ICD-10-CM | POA: Diagnosis not present

## 2018-11-19 DIAGNOSIS — R197 Diarrhea, unspecified: Secondary | ICD-10-CM | POA: Diagnosis not present

## 2018-11-19 LAB — URINE CULTURE: Culture: NO GROWTH

## 2018-11-19 NOTE — Telephone Encounter (Signed)
Televisit appt made to discuss anxiety

## 2018-11-20 DIAGNOSIS — M19041 Primary osteoarthritis, right hand: Secondary | ICD-10-CM | POA: Diagnosis not present

## 2018-11-20 DIAGNOSIS — M24542 Contracture, left hand: Secondary | ICD-10-CM | POA: Diagnosis not present

## 2018-11-20 DIAGNOSIS — M170 Bilateral primary osteoarthritis of knee: Secondary | ICD-10-CM | POA: Diagnosis not present

## 2018-11-20 DIAGNOSIS — I5032 Chronic diastolic (congestive) heart failure: Secondary | ICD-10-CM | POA: Diagnosis not present

## 2018-11-20 DIAGNOSIS — I69354 Hemiplegia and hemiparesis following cerebral infarction affecting left non-dominant side: Secondary | ICD-10-CM | POA: Diagnosis not present

## 2018-11-20 DIAGNOSIS — I110 Hypertensive heart disease with heart failure: Secondary | ICD-10-CM | POA: Diagnosis not present

## 2018-11-20 MED ORDER — BENAZEPRIL HCL 10 MG PO TABS: 5.0000 mg | ORAL_TABLET | Freq: Two times a day (BID) | ORAL | 1 refills | Status: DC

## 2018-11-20 MED ORDER — MECLIZINE HCL 12.5 MG PO TABS: 12.5000 mg | ORAL_TABLET | Freq: Two times a day (BID) | ORAL | 0 refills | Status: DC | PRN

## 2018-11-20 NOTE — Telephone Encounter (Signed)
Daughter confirmed dosage and new orders sent to pharmacy

## 2018-11-22 DIAGNOSIS — M170 Bilateral primary osteoarthritis of knee: Secondary | ICD-10-CM | POA: Diagnosis not present

## 2018-11-22 DIAGNOSIS — J45909 Unspecified asthma, uncomplicated: Secondary | ICD-10-CM | POA: Diagnosis not present

## 2018-11-22 DIAGNOSIS — D519 Vitamin B12 deficiency anemia, unspecified: Secondary | ICD-10-CM | POA: Diagnosis not present

## 2018-11-22 DIAGNOSIS — N390 Urinary tract infection, site not specified: Secondary | ICD-10-CM | POA: Diagnosis not present

## 2018-11-22 DIAGNOSIS — F419 Anxiety disorder, unspecified: Secondary | ICD-10-CM | POA: Diagnosis not present

## 2018-11-22 DIAGNOSIS — E669 Obesity, unspecified: Secondary | ICD-10-CM | POA: Diagnosis not present

## 2018-11-22 DIAGNOSIS — M19041 Primary osteoarthritis, right hand: Secondary | ICD-10-CM | POA: Diagnosis not present

## 2018-11-22 DIAGNOSIS — I739 Peripheral vascular disease, unspecified: Secondary | ICD-10-CM | POA: Diagnosis not present

## 2018-11-22 DIAGNOSIS — K219 Gastro-esophageal reflux disease without esophagitis: Secondary | ICD-10-CM | POA: Diagnosis not present

## 2018-11-22 DIAGNOSIS — F339 Major depressive disorder, recurrent, unspecified: Secondary | ICD-10-CM | POA: Diagnosis not present

## 2018-11-22 DIAGNOSIS — I5032 Chronic diastolic (congestive) heart failure: Secondary | ICD-10-CM | POA: Diagnosis not present

## 2018-11-22 DIAGNOSIS — F39 Unspecified mood [affective] disorder: Secondary | ICD-10-CM | POA: Diagnosis not present

## 2018-11-22 DIAGNOSIS — M24542 Contracture, left hand: Secondary | ICD-10-CM | POA: Diagnosis not present

## 2018-11-22 DIAGNOSIS — I110 Hypertensive heart disease with heart failure: Secondary | ICD-10-CM | POA: Diagnosis not present

## 2018-11-22 DIAGNOSIS — Z6841 Body Mass Index (BMI) 40.0 and over, adult: Secondary | ICD-10-CM | POA: Diagnosis not present

## 2018-11-22 DIAGNOSIS — I69354 Hemiplegia and hemiparesis following cerebral infarction affecting left non-dominant side: Secondary | ICD-10-CM | POA: Diagnosis not present

## 2018-11-24 ENCOUNTER — Encounter: Payer: Self-pay | Admitting: Family Medicine

## 2018-11-24 ENCOUNTER — Ambulatory Visit (INDEPENDENT_AMBULATORY_CARE_PROVIDER_SITE_OTHER): Payer: Medicare Other | Admitting: Family Medicine

## 2018-11-24 DIAGNOSIS — I110 Hypertensive heart disease with heart failure: Secondary | ICD-10-CM | POA: Diagnosis not present

## 2018-11-24 DIAGNOSIS — F418 Other specified anxiety disorders: Secondary | ICD-10-CM

## 2018-11-24 DIAGNOSIS — M170 Bilateral primary osteoarthritis of knee: Secondary | ICD-10-CM | POA: Diagnosis not present

## 2018-11-24 DIAGNOSIS — I739 Peripheral vascular disease, unspecified: Secondary | ICD-10-CM | POA: Diagnosis not present

## 2018-11-24 DIAGNOSIS — I5032 Chronic diastolic (congestive) heart failure: Secondary | ICD-10-CM | POA: Diagnosis not present

## 2018-11-24 DIAGNOSIS — F0391 Unspecified dementia with behavioral disturbance: Secondary | ICD-10-CM

## 2018-11-24 DIAGNOSIS — N390 Urinary tract infection, site not specified: Secondary | ICD-10-CM | POA: Diagnosis not present

## 2018-11-24 DIAGNOSIS — I69354 Hemiplegia and hemiparesis following cerebral infarction affecting left non-dominant side: Secondary | ICD-10-CM | POA: Diagnosis not present

## 2018-11-24 MED ORDER — QUETIAPINE FUMARATE ER 50 MG PO TB24: 150.0000 mg | ORAL_TABLET | Freq: Every day | ORAL | 1 refills | Status: DC

## 2018-11-24 MED ORDER — QUETIAPINE FUMARATE ER 50 MG PO TB24: 50.0000 mg | ORAL_TABLET | Freq: Every day | ORAL | 1 refills | Status: DC

## 2018-11-24 NOTE — Progress Notes (Signed)
Subjective:    Patient ID: Megan Fisher, female    DOB: 06/10/1928, 85 y.o.   MRN: [id number]   HPI: Megan Fisher is a 85 y.o. female presenting for what her daughter describes as delirium.  Since she became ill with her recent hospitalization, she has become more and more irritable.  She has times when she just becomes very very uncontrollably angry without any instigation.  It just seems to occur out of the blue.  At times it will last up to an hour and then she will just calm down on her own.  She is quite agitated during this time.  She has caregivers that are getting worn out from this behavior.  There has been no acute symptom otherwise such as fever chills cough.  She does not complain of pain.  Her emergency room report from 7[facility] at [facility] was reviewed.  At that time her spells were described as confusion with violence at times.  [facility] was treated for a UTI just prior to that.  She has a slow has had multiple episodes of diarrhea during that timeframe but the daughter tells me that has gotten better.  The C. difficile test that was collected apparently was inadequate and the lab had to cancel the assay.   Dep[facility] 2/9 09/21/2018 10/31/2014 09/27/2013  Decreased Interest 0 3 0  Down, Depressed, Hopeless 0 3 0  PHQ - 2 Score 0 6 0  Altered sleeping - 3 -  Tired, decreased energy - 3 -  Change in appetite - 2 -  Feeling bad or failure about yourself  - 3 -  Trouble concentrating - 2 -  Moving slowly or fidgety/restless - 2 -  Suicidal thoughts - 0 -  PHQ-9 Score - 21 -     Relevant past medical, surgical, family and social history reviewed and updated as indicated.  Interim medical history since our last visit reviewed. Allergies and medications reviewed and updated.  ROS:  Review of Systems  Unable to perform ROS: Mental status change     Social History   Tobacco Use  Smoking Status Never Smoker  Smokeless Tobacco Never  Used       Objective:     Wt Readings from Last 3 Encounters:  11/18/18 255 lb (115.7 kg)  10/06/14 278 lb (126.1 kg)  09/27/13 259 lb 6.4 oz (117.7 kg)     Exam deferred. Pt. Harboring due to COVID 19. Phone visit performed.   Assessment & Plan:   1. Dementia with behavioral disturbance, unspecified dementia type (HCC)   2. Depression with anxiety     Meds ordered this encounter  Medications  . DISCONTD: QUEtiapine (SEROQUEL XR) 50 MG TB24 24 hr tablet    Sig: Take 3 tablets (150 mg total) by mouth at bedtime.    Dispense:  30 tablet    Refill:  1    To help with mood  . QUEtiapine (SEROQUEL XR) 50 MG TB24 24 hr tablet    Sig: Take 1 tablet (50 mg total) by mouth at bedtime.    Dispense:  30 tablet    Refill:  1    To help with mood    No orders of the defined types were placed in this encounter.     Diagnoses and all orders for this visit:  Dementia with behavioral disturbance, unspecified dementia type (HCC)  Depression with anxiety  Other  orders -     Discontinue: QUEtiapine (SEROQUEL XR) 50 MG TB24 24 hr tablet; Take 3 tablets (150 mg total) by mouth at bedtime. -     QUEtiapine (SEROQUEL XR) 50 MG TB24 24 hr tablet; Take 1 tablet (50 mg total) by mouth at bedtime.    Virtual Visit via telephone Note  I discussed the limitations, risks, security and privacy concerns of performing an evaluation and management service by telephone and the availability of in person appointments. The patient was identified with two identifiers. Pt.expressed understanding and agreed to proceed. Pt. Is at home. Dr. Livia Snellen is in his office.  Follow Up Instructions:   I discussed the assessment and treatment plan with the patient. The patient was provided an opportunity to ask questions and all were answered. The patient agreed with the plan and demonstrated an understanding of the instructions.   The patient was advised to call back or seek an in-person evaluation if the  symptoms worsen or if the condition fails to improve as anticipated.   Total minutes including chart review and phone contact time: 30   Follow up plan: Return in about 2 weeks (around 12/08/2018).  Claretta Fraise, MD [place]

## 2018-11-25 LAB — CDIFF NAA+O+P+STOOL CULTURE: E coli, Shiga toxin Assay: NEGATIVE

## 2018-11-26 ENCOUNTER — Other Ambulatory Visit: Payer: Self-pay

## 2018-11-26 ENCOUNTER — Ambulatory Visit (INDEPENDENT_AMBULATORY_CARE_PROVIDER_SITE_OTHER): Payer: Medicare Other

## 2018-11-26 DIAGNOSIS — I5032 Chronic diastolic (congestive) heart failure: Secondary | ICD-10-CM

## 2018-11-26 DIAGNOSIS — N390 Urinary tract infection, site not specified: Secondary | ICD-10-CM

## 2018-11-26 DIAGNOSIS — F419 Anxiety disorder, unspecified: Secondary | ICD-10-CM

## 2018-11-26 DIAGNOSIS — I69354 Hemiplegia and hemiparesis following cerebral infarction affecting left non-dominant side: Secondary | ICD-10-CM

## 2018-11-26 DIAGNOSIS — F339 Major depressive disorder, recurrent, unspecified: Secondary | ICD-10-CM | POA: Diagnosis not present

## 2018-11-26 DIAGNOSIS — J45909 Unspecified asthma, uncomplicated: Secondary | ICD-10-CM | POA: Diagnosis not present

## 2018-11-26 DIAGNOSIS — K219 Gastro-esophageal reflux disease without esophagitis: Secondary | ICD-10-CM

## 2018-11-26 DIAGNOSIS — M19041 Primary osteoarthritis, right hand: Secondary | ICD-10-CM

## 2018-11-26 DIAGNOSIS — M170 Bilateral primary osteoarthritis of knee: Secondary | ICD-10-CM

## 2018-11-26 DIAGNOSIS — F39 Unspecified mood [affective] disorder: Secondary | ICD-10-CM

## 2018-11-26 DIAGNOSIS — I739 Peripheral vascular disease, unspecified: Secondary | ICD-10-CM

## 2018-11-26 DIAGNOSIS — I110 Hypertensive heart disease with heart failure: Secondary | ICD-10-CM | POA: Diagnosis not present

## 2018-11-26 DIAGNOSIS — D519 Vitamin B12 deficiency anemia, unspecified: Secondary | ICD-10-CM

## 2018-11-26 DIAGNOSIS — E669 Obesity, unspecified: Secondary | ICD-10-CM

## 2018-11-26 DIAGNOSIS — Z6841 Body Mass Index (BMI) 40.0 and over, adult: Secondary | ICD-10-CM

## 2018-11-26 DIAGNOSIS — M24542 Contracture, left hand: Secondary | ICD-10-CM | POA: Diagnosis not present

## 2018-11-27 ENCOUNTER — Other Ambulatory Visit: Payer: Self-pay | Admitting: *Deleted

## 2018-11-27 DIAGNOSIS — M170 Bilateral primary osteoarthritis of knee: Secondary | ICD-10-CM | POA: Diagnosis not present

## 2018-11-27 DIAGNOSIS — I69354 Hemiplegia and hemiparesis following cerebral infarction affecting left non-dominant side: Secondary | ICD-10-CM | POA: Diagnosis not present

## 2018-11-27 DIAGNOSIS — I110 Hypertensive heart disease with heart failure: Secondary | ICD-10-CM | POA: Diagnosis not present

## 2018-11-27 DIAGNOSIS — N390 Urinary tract infection, site not specified: Secondary | ICD-10-CM | POA: Diagnosis not present

## 2018-11-27 DIAGNOSIS — I739 Peripheral vascular disease, unspecified: Secondary | ICD-10-CM | POA: Diagnosis not present

## 2018-11-27 DIAGNOSIS — I5032 Chronic diastolic (congestive) heart failure: Secondary | ICD-10-CM | POA: Diagnosis not present

## 2018-11-27 NOTE — Telephone Encounter (Signed)
What is the name of the historical medication that you are referring to? WS

## 2018-11-27 NOTE — Telephone Encounter (Signed)
Please give medication name.

## 2018-11-29 MED ORDER — FUROSEMIDE 20 MG PO TABS: 20.0000 mg | ORAL_TABLET | Freq: Every day | ORAL | 2 refills | Status: DC

## 2018-11-30 NOTE — Telephone Encounter (Signed)
Please contact the patient Diarrhea is a side effect of paxil and furosemide. Stop those medications for now.

## 2018-12-01 ENCOUNTER — Telehealth: Payer: Self-pay | Admitting: *Deleted

## 2018-12-01 DIAGNOSIS — I110 Hypertensive heart disease with heart failure: Secondary | ICD-10-CM | POA: Diagnosis not present

## 2018-12-01 DIAGNOSIS — I69354 Hemiplegia and hemiparesis following cerebral infarction affecting left non-dominant side: Secondary | ICD-10-CM | POA: Diagnosis not present

## 2018-12-01 DIAGNOSIS — I5032 Chronic diastolic (congestive) heart failure: Secondary | ICD-10-CM | POA: Diagnosis not present

## 2018-12-01 DIAGNOSIS — N390 Urinary tract infection, site not specified: Secondary | ICD-10-CM | POA: Diagnosis not present

## 2018-12-01 DIAGNOSIS — I739 Peripheral vascular disease, unspecified: Secondary | ICD-10-CM | POA: Diagnosis not present

## 2018-12-01 DIAGNOSIS — M170 Bilateral primary osteoarthritis of knee: Secondary | ICD-10-CM | POA: Diagnosis not present

## 2018-12-01 NOTE — Telephone Encounter (Signed)
Vm received from [place] w/ [facility] @ home Pt has had diarrhea for a week Please advise

## 2018-12-03 ENCOUNTER — Other Ambulatory Visit: Payer: Self-pay | Admitting: Family Medicine

## 2018-12-03 ENCOUNTER — Telehealth: Payer: Self-pay | Admitting: *Deleted

## 2018-12-03 DIAGNOSIS — I6350 Cerebral infarction due to unspecified occlusion or stenosis of unspecified cerebral artery: Secondary | ICD-10-CM | POA: Diagnosis not present

## 2018-12-03 DIAGNOSIS — M1990 Unspecified osteoarthritis, unspecified site: Secondary | ICD-10-CM | POA: Diagnosis not present

## 2018-12-03 DIAGNOSIS — I5022 Chronic systolic (congestive) heart failure: Secondary | ICD-10-CM | POA: Diagnosis not present

## 2018-12-03 DIAGNOSIS — E669 Obesity, unspecified: Secondary | ICD-10-CM | POA: Diagnosis not present

## 2018-12-03 DIAGNOSIS — Z87440 Personal history of urinary (tract) infections: Secondary | ICD-10-CM | POA: Diagnosis not present

## 2018-12-03 DIAGNOSIS — F419 Anxiety disorder, unspecified: Secondary | ICD-10-CM | POA: Diagnosis not present

## 2018-12-03 MED ORDER — QUETIAPINE FUMARATE 50 MG PO TABS: 50.0000 mg | ORAL_TABLET | Freq: Every day | ORAL | 1 refills | Status: DC

## 2018-12-03 NOTE — Telephone Encounter (Signed)
Prior Auth for Quetiapine ER 50mg  tab-In Process  Key: [id number]   PA Case ID: [id number]    Your information has been submitted to [place] Medicare Part D. [employer] will review the request and will issue a decision, typically within 1-3 days from your submission. You can check the updated outcome later by reopening this request.  If [employer] has not responded in 1-3 days or if you have any questions about your ePA request, please contact [place] Medicare Part D at [phone]. If you think there may be a problem with your PA request, use our live chat feature at the bottom right.

## 2018-12-03 NOTE — Telephone Encounter (Signed)
Has the pt had an inadequate treatment response, intolerance, or contraindications to one of the following: Aripiprazole, lurasidone, olanzapine, paliperidone, quetiapine immediate-release, risperidone or ziprasidone?  An explanation of why the enrollee would suffer adverse effects if he or she were required to satisfy the PA requirement.  If you have any questions please call [phone]

## 2018-12-03 NOTE — Telephone Encounter (Signed)
I sent in the requested prescription 

## 2018-12-03 NOTE — Telephone Encounter (Signed)
Brian aware

## 2018-12-04 DIAGNOSIS — E669 Obesity, unspecified: Secondary | ICD-10-CM | POA: Diagnosis not present

## 2018-12-04 DIAGNOSIS — Z87440 Personal history of urinary (tract) infections: Secondary | ICD-10-CM | POA: Diagnosis not present

## 2018-12-04 DIAGNOSIS — F419 Anxiety disorder, unspecified: Secondary | ICD-10-CM | POA: Diagnosis not present

## 2018-12-04 DIAGNOSIS — I5022 Chronic systolic (congestive) heart failure: Secondary | ICD-10-CM | POA: Diagnosis not present

## 2018-12-04 DIAGNOSIS — M1990 Unspecified osteoarthritis, unspecified site: Secondary | ICD-10-CM | POA: Diagnosis not present

## 2018-12-04 DIAGNOSIS — I6350 Cerebral infarction due to unspecified occlusion or stenosis of unspecified cerebral artery: Secondary | ICD-10-CM | POA: Diagnosis not present

## 2018-12-07 DIAGNOSIS — I5022 Chronic systolic (congestive) heart failure: Secondary | ICD-10-CM | POA: Diagnosis not present

## 2018-12-07 DIAGNOSIS — Z87440 Personal history of urinary (tract) infections: Secondary | ICD-10-CM | POA: Diagnosis not present

## 2018-12-07 DIAGNOSIS — I6350 Cerebral infarction due to unspecified occlusion or stenosis of unspecified cerebral artery: Secondary | ICD-10-CM | POA: Diagnosis not present

## 2018-12-07 DIAGNOSIS — F419 Anxiety disorder, unspecified: Secondary | ICD-10-CM | POA: Diagnosis not present

## 2018-12-07 DIAGNOSIS — E669 Obesity, unspecified: Secondary | ICD-10-CM | POA: Diagnosis not present

## 2018-12-07 DIAGNOSIS — M1990 Unspecified osteoarthritis, unspecified site: Secondary | ICD-10-CM | POA: Diagnosis not present

## 2018-12-10 DIAGNOSIS — Z87440 Personal history of urinary (tract) infections: Secondary | ICD-10-CM | POA: Diagnosis not present

## 2018-12-10 DIAGNOSIS — I6350 Cerebral infarction due to unspecified occlusion or stenosis of unspecified cerebral artery: Secondary | ICD-10-CM | POA: Diagnosis not present

## 2018-12-10 DIAGNOSIS — M1990 Unspecified osteoarthritis, unspecified site: Secondary | ICD-10-CM | POA: Diagnosis not present

## 2018-12-10 DIAGNOSIS — I5022 Chronic systolic (congestive) heart failure: Secondary | ICD-10-CM | POA: Diagnosis not present

## 2018-12-10 DIAGNOSIS — F419 Anxiety disorder, unspecified: Secondary | ICD-10-CM | POA: Diagnosis not present

## 2018-12-10 DIAGNOSIS — E669 Obesity, unspecified: Secondary | ICD-10-CM | POA: Diagnosis not present

## 2018-12-10 NOTE — Telephone Encounter (Signed)
Multiple attempts made to contact patient.  This encounter will now be closed  

## 2018-12-11 DIAGNOSIS — Z87440 Personal history of urinary (tract) infections: Secondary | ICD-10-CM | POA: Diagnosis not present

## 2018-12-11 DIAGNOSIS — E669 Obesity, unspecified: Secondary | ICD-10-CM | POA: Diagnosis not present

## 2018-12-11 DIAGNOSIS — M1990 Unspecified osteoarthritis, unspecified site: Secondary | ICD-10-CM | POA: Diagnosis not present

## 2018-12-11 DIAGNOSIS — F419 Anxiety disorder, unspecified: Secondary | ICD-10-CM | POA: Diagnosis not present

## 2018-12-11 DIAGNOSIS — I5022 Chronic systolic (congestive) heart failure: Secondary | ICD-10-CM | POA: Diagnosis not present

## 2018-12-11 DIAGNOSIS — I6350 Cerebral infarction due to unspecified occlusion or stenosis of unspecified cerebral artery: Secondary | ICD-10-CM | POA: Diagnosis not present

## 2018-12-14 DIAGNOSIS — M1990 Unspecified osteoarthritis, unspecified site: Secondary | ICD-10-CM | POA: Diagnosis not present

## 2018-12-14 DIAGNOSIS — I6350 Cerebral infarction due to unspecified occlusion or stenosis of unspecified cerebral artery: Secondary | ICD-10-CM | POA: Diagnosis not present

## 2018-12-14 DIAGNOSIS — I5022 Chronic systolic (congestive) heart failure: Secondary | ICD-10-CM | POA: Diagnosis not present

## 2018-12-14 DIAGNOSIS — E669 Obesity, unspecified: Secondary | ICD-10-CM | POA: Diagnosis not present

## 2018-12-14 DIAGNOSIS — F419 Anxiety disorder, unspecified: Secondary | ICD-10-CM | POA: Diagnosis not present

## 2018-12-14 DIAGNOSIS — Z87440 Personal history of urinary (tract) infections: Secondary | ICD-10-CM | POA: Diagnosis not present

## 2018-12-15 DIAGNOSIS — E669 Obesity, unspecified: Secondary | ICD-10-CM | POA: Diagnosis not present

## 2018-12-15 DIAGNOSIS — I6350 Cerebral infarction due to unspecified occlusion or stenosis of unspecified cerebral artery: Secondary | ICD-10-CM | POA: Diagnosis not present

## 2018-12-15 DIAGNOSIS — F419 Anxiety disorder, unspecified: Secondary | ICD-10-CM | POA: Diagnosis not present

## 2018-12-15 DIAGNOSIS — I5022 Chronic systolic (congestive) heart failure: Secondary | ICD-10-CM | POA: Diagnosis not present

## 2018-12-15 DIAGNOSIS — Z87440 Personal history of urinary (tract) infections: Secondary | ICD-10-CM | POA: Diagnosis not present

## 2018-12-15 DIAGNOSIS — M1990 Unspecified osteoarthritis, unspecified site: Secondary | ICD-10-CM | POA: Diagnosis not present

## 2018-12-16 DIAGNOSIS — E669 Obesity, unspecified: Secondary | ICD-10-CM | POA: Diagnosis not present

## 2018-12-16 DIAGNOSIS — I5022 Chronic systolic (congestive) heart failure: Secondary | ICD-10-CM | POA: Diagnosis not present

## 2018-12-16 DIAGNOSIS — M1990 Unspecified osteoarthritis, unspecified site: Secondary | ICD-10-CM | POA: Diagnosis not present

## 2018-12-16 DIAGNOSIS — F419 Anxiety disorder, unspecified: Secondary | ICD-10-CM | POA: Diagnosis not present

## 2018-12-16 DIAGNOSIS — Z87440 Personal history of urinary (tract) infections: Secondary | ICD-10-CM | POA: Diagnosis not present

## 2018-12-16 DIAGNOSIS — I6350 Cerebral infarction due to unspecified occlusion or stenosis of unspecified cerebral artery: Secondary | ICD-10-CM | POA: Diagnosis not present

## 2018-12-17 DIAGNOSIS — I5022 Chronic systolic (congestive) heart failure: Secondary | ICD-10-CM | POA: Diagnosis not present

## 2018-12-17 DIAGNOSIS — Z87440 Personal history of urinary (tract) infections: Secondary | ICD-10-CM | POA: Diagnosis not present

## 2018-12-17 DIAGNOSIS — E669 Obesity, unspecified: Secondary | ICD-10-CM | POA: Diagnosis not present

## 2018-12-17 DIAGNOSIS — M1990 Unspecified osteoarthritis, unspecified site: Secondary | ICD-10-CM | POA: Diagnosis not present

## 2018-12-17 DIAGNOSIS — F419 Anxiety disorder, unspecified: Secondary | ICD-10-CM | POA: Diagnosis not present

## 2018-12-17 DIAGNOSIS — I6350 Cerebral infarction due to unspecified occlusion or stenosis of unspecified cerebral artery: Secondary | ICD-10-CM | POA: Diagnosis not present

## 2018-12-21 DIAGNOSIS — M1990 Unspecified osteoarthritis, unspecified site: Secondary | ICD-10-CM | POA: Diagnosis not present

## 2018-12-21 DIAGNOSIS — F419 Anxiety disorder, unspecified: Secondary | ICD-10-CM | POA: Diagnosis not present

## 2018-12-21 DIAGNOSIS — I5022 Chronic systolic (congestive) heart failure: Secondary | ICD-10-CM | POA: Diagnosis not present

## 2018-12-21 DIAGNOSIS — E669 Obesity, unspecified: Secondary | ICD-10-CM | POA: Diagnosis not present

## 2018-12-21 DIAGNOSIS — Z87440 Personal history of urinary (tract) infections: Secondary | ICD-10-CM | POA: Diagnosis not present

## 2018-12-21 DIAGNOSIS — I6350 Cerebral infarction due to unspecified occlusion or stenosis of unspecified cerebral artery: Secondary | ICD-10-CM | POA: Diagnosis not present

## 2018-12-22 DIAGNOSIS — E669 Obesity, unspecified: Secondary | ICD-10-CM | POA: Diagnosis not present

## 2018-12-22 DIAGNOSIS — I6350 Cerebral infarction due to unspecified occlusion or stenosis of unspecified cerebral artery: Secondary | ICD-10-CM | POA: Diagnosis not present

## 2018-12-22 DIAGNOSIS — I5022 Chronic systolic (congestive) heart failure: Secondary | ICD-10-CM | POA: Diagnosis not present

## 2018-12-22 DIAGNOSIS — Z87440 Personal history of urinary (tract) infections: Secondary | ICD-10-CM | POA: Diagnosis not present

## 2018-12-22 DIAGNOSIS — F419 Anxiety disorder, unspecified: Secondary | ICD-10-CM | POA: Diagnosis not present

## 2018-12-22 DIAGNOSIS — M1990 Unspecified osteoarthritis, unspecified site: Secondary | ICD-10-CM | POA: Diagnosis not present

## 2018-12-23 DIAGNOSIS — M1990 Unspecified osteoarthritis, unspecified site: Secondary | ICD-10-CM | POA: Diagnosis not present

## 2018-12-23 DIAGNOSIS — I6350 Cerebral infarction due to unspecified occlusion or stenosis of unspecified cerebral artery: Secondary | ICD-10-CM | POA: Diagnosis not present

## 2018-12-23 DIAGNOSIS — E669 Obesity, unspecified: Secondary | ICD-10-CM | POA: Diagnosis not present

## 2018-12-23 DIAGNOSIS — Z87440 Personal history of urinary (tract) infections: Secondary | ICD-10-CM | POA: Diagnosis not present

## 2018-12-23 DIAGNOSIS — I5022 Chronic systolic (congestive) heart failure: Secondary | ICD-10-CM | POA: Diagnosis not present

## 2018-12-23 DIAGNOSIS — F419 Anxiety disorder, unspecified: Secondary | ICD-10-CM | POA: Diagnosis not present

## 2018-12-24 DIAGNOSIS — Z87440 Personal history of urinary (tract) infections: Secondary | ICD-10-CM | POA: Diagnosis not present

## 2018-12-24 DIAGNOSIS — M1990 Unspecified osteoarthritis, unspecified site: Secondary | ICD-10-CM | POA: Diagnosis not present

## 2018-12-24 DIAGNOSIS — I6350 Cerebral infarction due to unspecified occlusion or stenosis of unspecified cerebral artery: Secondary | ICD-10-CM | POA: Diagnosis not present

## 2018-12-24 DIAGNOSIS — I5022 Chronic systolic (congestive) heart failure: Secondary | ICD-10-CM | POA: Diagnosis not present

## 2018-12-24 DIAGNOSIS — F419 Anxiety disorder, unspecified: Secondary | ICD-10-CM | POA: Diagnosis not present

## 2018-12-24 DIAGNOSIS — E669 Obesity, unspecified: Secondary | ICD-10-CM | POA: Diagnosis not present

## 2018-12-25 DIAGNOSIS — I6350 Cerebral infarction due to unspecified occlusion or stenosis of unspecified cerebral artery: Secondary | ICD-10-CM | POA: Diagnosis not present

## 2018-12-25 DIAGNOSIS — I5022 Chronic systolic (congestive) heart failure: Secondary | ICD-10-CM | POA: Diagnosis not present

## 2018-12-25 DIAGNOSIS — F419 Anxiety disorder, unspecified: Secondary | ICD-10-CM | POA: Diagnosis not present

## 2018-12-25 DIAGNOSIS — E669 Obesity, unspecified: Secondary | ICD-10-CM | POA: Diagnosis not present

## 2018-12-25 DIAGNOSIS — M1990 Unspecified osteoarthritis, unspecified site: Secondary | ICD-10-CM | POA: Diagnosis not present

## 2018-12-25 DIAGNOSIS — Z87440 Personal history of urinary (tract) infections: Secondary | ICD-10-CM | POA: Diagnosis not present

## 2018-12-30 DIAGNOSIS — Z87440 Personal history of urinary (tract) infections: Secondary | ICD-10-CM | POA: Diagnosis not present

## 2018-12-30 DIAGNOSIS — M1990 Unspecified osteoarthritis, unspecified site: Secondary | ICD-10-CM | POA: Diagnosis not present

## 2018-12-30 DIAGNOSIS — I6350 Cerebral infarction due to unspecified occlusion or stenosis of unspecified cerebral artery: Secondary | ICD-10-CM | POA: Diagnosis not present

## 2018-12-30 DIAGNOSIS — F419 Anxiety disorder, unspecified: Secondary | ICD-10-CM | POA: Diagnosis not present

## 2018-12-30 DIAGNOSIS — E669 Obesity, unspecified: Secondary | ICD-10-CM | POA: Diagnosis not present

## 2018-12-30 DIAGNOSIS — I5022 Chronic systolic (congestive) heart failure: Secondary | ICD-10-CM | POA: Diagnosis not present

## 2019-01-01 DIAGNOSIS — I5022 Chronic systolic (congestive) heart failure: Secondary | ICD-10-CM | POA: Diagnosis not present

## 2019-01-01 DIAGNOSIS — M1990 Unspecified osteoarthritis, unspecified site: Secondary | ICD-10-CM | POA: Diagnosis not present

## 2019-01-01 DIAGNOSIS — F419 Anxiety disorder, unspecified: Secondary | ICD-10-CM | POA: Diagnosis not present

## 2019-01-01 DIAGNOSIS — Z87440 Personal history of urinary (tract) infections: Secondary | ICD-10-CM | POA: Diagnosis not present

## 2019-01-01 DIAGNOSIS — I6350 Cerebral infarction due to unspecified occlusion or stenosis of unspecified cerebral artery: Secondary | ICD-10-CM | POA: Diagnosis not present

## 2019-01-01 DIAGNOSIS — E669 Obesity, unspecified: Secondary | ICD-10-CM | POA: Diagnosis not present

## 2019-01-05 DIAGNOSIS — M1990 Unspecified osteoarthritis, unspecified site: Secondary | ICD-10-CM | POA: Diagnosis not present

## 2019-01-05 DIAGNOSIS — I6350 Cerebral infarction due to unspecified occlusion or stenosis of unspecified cerebral artery: Secondary | ICD-10-CM | POA: Diagnosis not present

## 2019-01-05 DIAGNOSIS — Z87440 Personal history of urinary (tract) infections: Secondary | ICD-10-CM | POA: Diagnosis not present

## 2019-01-05 DIAGNOSIS — I5022 Chronic systolic (congestive) heart failure: Secondary | ICD-10-CM | POA: Diagnosis not present

## 2019-01-05 DIAGNOSIS — F419 Anxiety disorder, unspecified: Secondary | ICD-10-CM | POA: Diagnosis not present

## 2019-01-05 DIAGNOSIS — E669 Obesity, unspecified: Secondary | ICD-10-CM | POA: Diagnosis not present

## 2019-01-06 DIAGNOSIS — I6350 Cerebral infarction due to unspecified occlusion or stenosis of unspecified cerebral artery: Secondary | ICD-10-CM | POA: Diagnosis not present

## 2019-01-06 DIAGNOSIS — F419 Anxiety disorder, unspecified: Secondary | ICD-10-CM | POA: Diagnosis not present

## 2019-01-06 DIAGNOSIS — M1990 Unspecified osteoarthritis, unspecified site: Secondary | ICD-10-CM | POA: Diagnosis not present

## 2019-01-06 DIAGNOSIS — Z87440 Personal history of urinary (tract) infections: Secondary | ICD-10-CM | POA: Diagnosis not present

## 2019-01-06 DIAGNOSIS — I5022 Chronic systolic (congestive) heart failure: Secondary | ICD-10-CM | POA: Diagnosis not present

## 2019-01-06 DIAGNOSIS — E669 Obesity, unspecified: Secondary | ICD-10-CM | POA: Diagnosis not present

## 2019-01-08 DIAGNOSIS — I6350 Cerebral infarction due to unspecified occlusion or stenosis of unspecified cerebral artery: Secondary | ICD-10-CM | POA: Diagnosis not present

## 2019-01-08 DIAGNOSIS — Z87440 Personal history of urinary (tract) infections: Secondary | ICD-10-CM | POA: Diagnosis not present

## 2019-01-08 DIAGNOSIS — E669 Obesity, unspecified: Secondary | ICD-10-CM | POA: Diagnosis not present

## 2019-01-08 DIAGNOSIS — F419 Anxiety disorder, unspecified: Secondary | ICD-10-CM | POA: Diagnosis not present

## 2019-01-08 DIAGNOSIS — I5022 Chronic systolic (congestive) heart failure: Secondary | ICD-10-CM | POA: Diagnosis not present

## 2019-01-08 DIAGNOSIS — M1990 Unspecified osteoarthritis, unspecified site: Secondary | ICD-10-CM | POA: Diagnosis not present

## 2019-01-13 DIAGNOSIS — F419 Anxiety disorder, unspecified: Secondary | ICD-10-CM | POA: Diagnosis not present

## 2019-01-13 DIAGNOSIS — E669 Obesity, unspecified: Secondary | ICD-10-CM | POA: Diagnosis not present

## 2019-01-13 DIAGNOSIS — M1990 Unspecified osteoarthritis, unspecified site: Secondary | ICD-10-CM | POA: Diagnosis not present

## 2019-01-13 DIAGNOSIS — I6350 Cerebral infarction due to unspecified occlusion or stenosis of unspecified cerebral artery: Secondary | ICD-10-CM | POA: Diagnosis not present

## 2019-01-13 DIAGNOSIS — I5022 Chronic systolic (congestive) heart failure: Secondary | ICD-10-CM | POA: Diagnosis not present

## 2019-01-13 DIAGNOSIS — Z87440 Personal history of urinary (tract) infections: Secondary | ICD-10-CM | POA: Diagnosis not present

## 2019-01-14 ENCOUNTER — Other Ambulatory Visit: Payer: Self-pay | Admitting: Family Medicine

## 2019-01-15 DIAGNOSIS — I6350 Cerebral infarction due to unspecified occlusion or stenosis of unspecified cerebral artery: Secondary | ICD-10-CM | POA: Diagnosis not present

## 2019-01-15 DIAGNOSIS — Z87440 Personal history of urinary (tract) infections: Secondary | ICD-10-CM | POA: Diagnosis not present

## 2019-01-15 DIAGNOSIS — I5022 Chronic systolic (congestive) heart failure: Secondary | ICD-10-CM | POA: Diagnosis not present

## 2019-01-15 DIAGNOSIS — E669 Obesity, unspecified: Secondary | ICD-10-CM | POA: Diagnosis not present

## 2019-01-15 DIAGNOSIS — M1990 Unspecified osteoarthritis, unspecified site: Secondary | ICD-10-CM | POA: Diagnosis not present

## 2019-01-15 DIAGNOSIS — F419 Anxiety disorder, unspecified: Secondary | ICD-10-CM | POA: Diagnosis not present

## 2019-01-20 DIAGNOSIS — M1990 Unspecified osteoarthritis, unspecified site: Secondary | ICD-10-CM | POA: Diagnosis not present

## 2019-01-20 DIAGNOSIS — E669 Obesity, unspecified: Secondary | ICD-10-CM | POA: Diagnosis not present

## 2019-01-20 DIAGNOSIS — Z87440 Personal history of urinary (tract) infections: Secondary | ICD-10-CM | POA: Diagnosis not present

## 2019-01-20 DIAGNOSIS — I6350 Cerebral infarction due to unspecified occlusion or stenosis of unspecified cerebral artery: Secondary | ICD-10-CM | POA: Diagnosis not present

## 2019-01-20 DIAGNOSIS — F419 Anxiety disorder, unspecified: Secondary | ICD-10-CM | POA: Diagnosis not present

## 2019-01-20 DIAGNOSIS — I5022 Chronic systolic (congestive) heart failure: Secondary | ICD-10-CM | POA: Diagnosis not present

## 2019-01-21 DIAGNOSIS — I5022 Chronic systolic (congestive) heart failure: Secondary | ICD-10-CM | POA: Diagnosis not present

## 2019-01-21 DIAGNOSIS — E669 Obesity, unspecified: Secondary | ICD-10-CM | POA: Diagnosis not present

## 2019-01-21 DIAGNOSIS — F419 Anxiety disorder, unspecified: Secondary | ICD-10-CM | POA: Diagnosis not present

## 2019-01-21 DIAGNOSIS — M1990 Unspecified osteoarthritis, unspecified site: Secondary | ICD-10-CM | POA: Diagnosis not present

## 2019-01-21 DIAGNOSIS — Z87440 Personal history of urinary (tract) infections: Secondary | ICD-10-CM | POA: Diagnosis not present

## 2019-01-21 DIAGNOSIS — I6350 Cerebral infarction due to unspecified occlusion or stenosis of unspecified cerebral artery: Secondary | ICD-10-CM | POA: Diagnosis not present

## 2019-01-22 DIAGNOSIS — I6350 Cerebral infarction due to unspecified occlusion or stenosis of unspecified cerebral artery: Secondary | ICD-10-CM | POA: Diagnosis not present

## 2019-01-22 DIAGNOSIS — M1990 Unspecified osteoarthritis, unspecified site: Secondary | ICD-10-CM | POA: Diagnosis not present

## 2019-01-22 DIAGNOSIS — I5022 Chronic systolic (congestive) heart failure: Secondary | ICD-10-CM | POA: Diagnosis not present

## 2019-01-22 DIAGNOSIS — Z87440 Personal history of urinary (tract) infections: Secondary | ICD-10-CM | POA: Diagnosis not present

## 2019-01-22 DIAGNOSIS — F419 Anxiety disorder, unspecified: Secondary | ICD-10-CM | POA: Diagnosis not present

## 2019-01-22 DIAGNOSIS — E669 Obesity, unspecified: Secondary | ICD-10-CM | POA: Diagnosis not present

## 2019-01-27 DIAGNOSIS — I6350 Cerebral infarction due to unspecified occlusion or stenosis of unspecified cerebral artery: Secondary | ICD-10-CM | POA: Diagnosis not present

## 2019-01-27 DIAGNOSIS — E669 Obesity, unspecified: Secondary | ICD-10-CM | POA: Diagnosis not present

## 2019-01-27 DIAGNOSIS — F419 Anxiety disorder, unspecified: Secondary | ICD-10-CM | POA: Diagnosis not present

## 2019-01-27 DIAGNOSIS — I5022 Chronic systolic (congestive) heart failure: Secondary | ICD-10-CM | POA: Diagnosis not present

## 2019-01-27 DIAGNOSIS — M1990 Unspecified osteoarthritis, unspecified site: Secondary | ICD-10-CM | POA: Diagnosis not present

## 2019-01-27 DIAGNOSIS — Z87440 Personal history of urinary (tract) infections: Secondary | ICD-10-CM | POA: Diagnosis not present

## 2019-01-29 DIAGNOSIS — I5022 Chronic systolic (congestive) heart failure: Secondary | ICD-10-CM | POA: Diagnosis not present

## 2019-01-29 DIAGNOSIS — E669 Obesity, unspecified: Secondary | ICD-10-CM | POA: Diagnosis not present

## 2019-01-29 DIAGNOSIS — Z87440 Personal history of urinary (tract) infections: Secondary | ICD-10-CM | POA: Diagnosis not present

## 2019-01-29 DIAGNOSIS — I6350 Cerebral infarction due to unspecified occlusion or stenosis of unspecified cerebral artery: Secondary | ICD-10-CM | POA: Diagnosis not present

## 2019-01-29 DIAGNOSIS — M1990 Unspecified osteoarthritis, unspecified site: Secondary | ICD-10-CM | POA: Diagnosis not present

## 2019-01-29 DIAGNOSIS — F419 Anxiety disorder, unspecified: Secondary | ICD-10-CM | POA: Diagnosis not present

## 2019-02-01 DIAGNOSIS — I6350 Cerebral infarction due to unspecified occlusion or stenosis of unspecified cerebral artery: Secondary | ICD-10-CM | POA: Diagnosis not present

## 2019-02-01 DIAGNOSIS — Z87440 Personal history of urinary (tract) infections: Secondary | ICD-10-CM | POA: Diagnosis not present

## 2019-02-01 DIAGNOSIS — E669 Obesity, unspecified: Secondary | ICD-10-CM | POA: Diagnosis not present

## 2019-02-01 DIAGNOSIS — M1990 Unspecified osteoarthritis, unspecified site: Secondary | ICD-10-CM | POA: Diagnosis not present

## 2019-02-01 DIAGNOSIS — F419 Anxiety disorder, unspecified: Secondary | ICD-10-CM | POA: Diagnosis not present

## 2019-02-01 DIAGNOSIS — I5022 Chronic systolic (congestive) heart failure: Secondary | ICD-10-CM | POA: Diagnosis not present

## 2019-02-02 DIAGNOSIS — I5022 Chronic systolic (congestive) heart failure: Secondary | ICD-10-CM | POA: Diagnosis not present

## 2019-02-02 DIAGNOSIS — Z87440 Personal history of urinary (tract) infections: Secondary | ICD-10-CM | POA: Diagnosis not present

## 2019-02-02 DIAGNOSIS — F419 Anxiety disorder, unspecified: Secondary | ICD-10-CM | POA: Diagnosis not present

## 2019-02-02 DIAGNOSIS — M1990 Unspecified osteoarthritis, unspecified site: Secondary | ICD-10-CM | POA: Diagnosis not present

## 2019-02-02 DIAGNOSIS — E669 Obesity, unspecified: Secondary | ICD-10-CM | POA: Diagnosis not present

## 2019-02-02 DIAGNOSIS — I6350 Cerebral infarction due to unspecified occlusion or stenosis of unspecified cerebral artery: Secondary | ICD-10-CM | POA: Diagnosis not present

## 2019-02-03 DIAGNOSIS — I5022 Chronic systolic (congestive) heart failure: Secondary | ICD-10-CM | POA: Diagnosis not present

## 2019-02-03 DIAGNOSIS — E669 Obesity, unspecified: Secondary | ICD-10-CM | POA: Diagnosis not present

## 2019-02-03 DIAGNOSIS — Z87440 Personal history of urinary (tract) infections: Secondary | ICD-10-CM | POA: Diagnosis not present

## 2019-02-03 DIAGNOSIS — M1990 Unspecified osteoarthritis, unspecified site: Secondary | ICD-10-CM | POA: Diagnosis not present

## 2019-02-03 DIAGNOSIS — F419 Anxiety disorder, unspecified: Secondary | ICD-10-CM | POA: Diagnosis not present

## 2019-02-03 DIAGNOSIS — I6350 Cerebral infarction due to unspecified occlusion or stenosis of unspecified cerebral artery: Secondary | ICD-10-CM | POA: Diagnosis not present

## 2019-02-05 DIAGNOSIS — E669 Obesity, unspecified: Secondary | ICD-10-CM | POA: Diagnosis not present

## 2019-02-05 DIAGNOSIS — M1990 Unspecified osteoarthritis, unspecified site: Secondary | ICD-10-CM | POA: Diagnosis not present

## 2019-02-05 DIAGNOSIS — Z87440 Personal history of urinary (tract) infections: Secondary | ICD-10-CM | POA: Diagnosis not present

## 2019-02-05 DIAGNOSIS — F419 Anxiety disorder, unspecified: Secondary | ICD-10-CM | POA: Diagnosis not present

## 2019-02-05 DIAGNOSIS — I6350 Cerebral infarction due to unspecified occlusion or stenosis of unspecified cerebral artery: Secondary | ICD-10-CM | POA: Diagnosis not present

## 2019-02-05 DIAGNOSIS — I5022 Chronic systolic (congestive) heart failure: Secondary | ICD-10-CM | POA: Diagnosis not present

## 2019-02-08 DIAGNOSIS — F419 Anxiety disorder, unspecified: Secondary | ICD-10-CM | POA: Diagnosis not present

## 2019-02-08 DIAGNOSIS — M1990 Unspecified osteoarthritis, unspecified site: Secondary | ICD-10-CM | POA: Diagnosis not present

## 2019-02-08 DIAGNOSIS — I5022 Chronic systolic (congestive) heart failure: Secondary | ICD-10-CM | POA: Diagnosis not present

## 2019-02-08 DIAGNOSIS — Z87440 Personal history of urinary (tract) infections: Secondary | ICD-10-CM | POA: Diagnosis not present

## 2019-02-08 DIAGNOSIS — I6350 Cerebral infarction due to unspecified occlusion or stenosis of unspecified cerebral artery: Secondary | ICD-10-CM | POA: Diagnosis not present

## 2019-02-08 DIAGNOSIS — E669 Obesity, unspecified: Secondary | ICD-10-CM | POA: Diagnosis not present

## 2019-02-09 DIAGNOSIS — I5022 Chronic systolic (congestive) heart failure: Secondary | ICD-10-CM | POA: Diagnosis not present

## 2019-02-09 DIAGNOSIS — Z87440 Personal history of urinary (tract) infections: Secondary | ICD-10-CM | POA: Diagnosis not present

## 2019-02-09 DIAGNOSIS — I6350 Cerebral infarction due to unspecified occlusion or stenosis of unspecified cerebral artery: Secondary | ICD-10-CM | POA: Diagnosis not present

## 2019-02-09 DIAGNOSIS — F419 Anxiety disorder, unspecified: Secondary | ICD-10-CM | POA: Diagnosis not present

## 2019-02-09 DIAGNOSIS — E669 Obesity, unspecified: Secondary | ICD-10-CM | POA: Diagnosis not present

## 2019-02-09 DIAGNOSIS — M1990 Unspecified osteoarthritis, unspecified site: Secondary | ICD-10-CM | POA: Diagnosis not present

## 2019-02-10 DIAGNOSIS — Z20828 Contact with and (suspected) exposure to other viral communicable diseases: Secondary | ICD-10-CM | POA: Diagnosis not present

## 2019-02-12 DIAGNOSIS — I6350 Cerebral infarction due to unspecified occlusion or stenosis of unspecified cerebral artery: Secondary | ICD-10-CM | POA: Diagnosis not present

## 2019-02-12 DIAGNOSIS — M1990 Unspecified osteoarthritis, unspecified site: Secondary | ICD-10-CM | POA: Diagnosis not present

## 2019-02-12 DIAGNOSIS — I5022 Chronic systolic (congestive) heart failure: Secondary | ICD-10-CM | POA: Diagnosis not present

## 2019-02-12 DIAGNOSIS — E669 Obesity, unspecified: Secondary | ICD-10-CM | POA: Diagnosis not present

## 2019-02-12 DIAGNOSIS — F419 Anxiety disorder, unspecified: Secondary | ICD-10-CM | POA: Diagnosis not present

## 2019-02-12 DIAGNOSIS — Z87440 Personal history of urinary (tract) infections: Secondary | ICD-10-CM | POA: Diagnosis not present

## 2019-02-17 DIAGNOSIS — E669 Obesity, unspecified: Secondary | ICD-10-CM | POA: Diagnosis not present

## 2019-02-17 DIAGNOSIS — M1990 Unspecified osteoarthritis, unspecified site: Secondary | ICD-10-CM | POA: Diagnosis not present

## 2019-02-17 DIAGNOSIS — I6350 Cerebral infarction due to unspecified occlusion or stenosis of unspecified cerebral artery: Secondary | ICD-10-CM | POA: Diagnosis not present

## 2019-02-17 DIAGNOSIS — I5022 Chronic systolic (congestive) heart failure: Secondary | ICD-10-CM | POA: Diagnosis not present

## 2019-02-17 DIAGNOSIS — Z87440 Personal history of urinary (tract) infections: Secondary | ICD-10-CM | POA: Diagnosis not present

## 2019-02-17 DIAGNOSIS — F419 Anxiety disorder, unspecified: Secondary | ICD-10-CM | POA: Diagnosis not present

## 2019-02-19 DIAGNOSIS — Z87440 Personal history of urinary (tract) infections: Secondary | ICD-10-CM | POA: Diagnosis not present

## 2019-02-19 DIAGNOSIS — I5022 Chronic systolic (congestive) heart failure: Secondary | ICD-10-CM | POA: Diagnosis not present

## 2019-02-19 DIAGNOSIS — E669 Obesity, unspecified: Secondary | ICD-10-CM | POA: Diagnosis not present

## 2019-02-19 DIAGNOSIS — I6350 Cerebral infarction due to unspecified occlusion or stenosis of unspecified cerebral artery: Secondary | ICD-10-CM | POA: Diagnosis not present

## 2019-02-19 DIAGNOSIS — F419 Anxiety disorder, unspecified: Secondary | ICD-10-CM | POA: Diagnosis not present

## 2019-02-19 DIAGNOSIS — M1990 Unspecified osteoarthritis, unspecified site: Secondary | ICD-10-CM | POA: Diagnosis not present

## 2019-02-21 DIAGNOSIS — Z87440 Personal history of urinary (tract) infections: Secondary | ICD-10-CM | POA: Diagnosis not present

## 2019-02-21 DIAGNOSIS — I5022 Chronic systolic (congestive) heart failure: Secondary | ICD-10-CM | POA: Diagnosis not present

## 2019-02-21 DIAGNOSIS — E669 Obesity, unspecified: Secondary | ICD-10-CM | POA: Diagnosis not present

## 2019-02-21 DIAGNOSIS — M1990 Unspecified osteoarthritis, unspecified site: Secondary | ICD-10-CM | POA: Diagnosis not present

## 2019-02-21 DIAGNOSIS — I6350 Cerebral infarction due to unspecified occlusion or stenosis of unspecified cerebral artery: Secondary | ICD-10-CM | POA: Diagnosis not present

## 2019-02-21 DIAGNOSIS — F419 Anxiety disorder, unspecified: Secondary | ICD-10-CM | POA: Diagnosis not present

## 2019-02-22 DIAGNOSIS — I6350 Cerebral infarction due to unspecified occlusion or stenosis of unspecified cerebral artery: Secondary | ICD-10-CM | POA: Diagnosis not present

## 2019-02-22 DIAGNOSIS — M1990 Unspecified osteoarthritis, unspecified site: Secondary | ICD-10-CM | POA: Diagnosis not present

## 2019-02-22 DIAGNOSIS — E669 Obesity, unspecified: Secondary | ICD-10-CM | POA: Diagnosis not present

## 2019-02-22 DIAGNOSIS — F419 Anxiety disorder, unspecified: Secondary | ICD-10-CM | POA: Diagnosis not present

## 2019-02-22 DIAGNOSIS — Z87440 Personal history of urinary (tract) infections: Secondary | ICD-10-CM | POA: Diagnosis not present

## 2019-02-22 DIAGNOSIS — I5022 Chronic systolic (congestive) heart failure: Secondary | ICD-10-CM | POA: Diagnosis not present

## 2019-02-24 ENCOUNTER — Other Ambulatory Visit: Payer: Self-pay | Admitting: Family Medicine

## 2019-02-25 DIAGNOSIS — E669 Obesity, unspecified: Secondary | ICD-10-CM | POA: Diagnosis not present

## 2019-02-25 DIAGNOSIS — I6350 Cerebral infarction due to unspecified occlusion or stenosis of unspecified cerebral artery: Secondary | ICD-10-CM | POA: Diagnosis not present

## 2019-02-25 DIAGNOSIS — I5022 Chronic systolic (congestive) heart failure: Secondary | ICD-10-CM | POA: Diagnosis not present

## 2019-02-25 DIAGNOSIS — F419 Anxiety disorder, unspecified: Secondary | ICD-10-CM | POA: Diagnosis not present

## 2019-02-25 DIAGNOSIS — M1990 Unspecified osteoarthritis, unspecified site: Secondary | ICD-10-CM | POA: Diagnosis not present

## 2019-02-25 DIAGNOSIS — Z87440 Personal history of urinary (tract) infections: Secondary | ICD-10-CM | POA: Diagnosis not present

## 2019-03-01 DIAGNOSIS — F419 Anxiety disorder, unspecified: Secondary | ICD-10-CM | POA: Diagnosis not present

## 2019-03-01 DIAGNOSIS — M1990 Unspecified osteoarthritis, unspecified site: Secondary | ICD-10-CM | POA: Diagnosis not present

## 2019-03-01 DIAGNOSIS — E669 Obesity, unspecified: Secondary | ICD-10-CM | POA: Diagnosis not present

## 2019-03-01 DIAGNOSIS — I5022 Chronic systolic (congestive) heart failure: Secondary | ICD-10-CM | POA: Diagnosis not present

## 2019-03-01 DIAGNOSIS — I6350 Cerebral infarction due to unspecified occlusion or stenosis of unspecified cerebral artery: Secondary | ICD-10-CM | POA: Diagnosis not present

## 2019-03-01 DIAGNOSIS — Z87440 Personal history of urinary (tract) infections: Secondary | ICD-10-CM | POA: Diagnosis not present

## 2019-03-04 DIAGNOSIS — I6350 Cerebral infarction due to unspecified occlusion or stenosis of unspecified cerebral artery: Secondary | ICD-10-CM | POA: Diagnosis not present

## 2019-03-04 DIAGNOSIS — E669 Obesity, unspecified: Secondary | ICD-10-CM | POA: Diagnosis not present

## 2019-03-04 DIAGNOSIS — M1990 Unspecified osteoarthritis, unspecified site: Secondary | ICD-10-CM | POA: Diagnosis not present

## 2019-03-04 DIAGNOSIS — F419 Anxiety disorder, unspecified: Secondary | ICD-10-CM | POA: Diagnosis not present

## 2019-03-04 DIAGNOSIS — Z87440 Personal history of urinary (tract) infections: Secondary | ICD-10-CM | POA: Diagnosis not present

## 2019-03-04 DIAGNOSIS — I5022 Chronic systolic (congestive) heart failure: Secondary | ICD-10-CM | POA: Diagnosis not present

## 2019-03-08 DIAGNOSIS — I5022 Chronic systolic (congestive) heart failure: Secondary | ICD-10-CM | POA: Diagnosis not present

## 2019-03-08 DIAGNOSIS — E669 Obesity, unspecified: Secondary | ICD-10-CM | POA: Diagnosis not present

## 2019-03-08 DIAGNOSIS — I6350 Cerebral infarction due to unspecified occlusion or stenosis of unspecified cerebral artery: Secondary | ICD-10-CM | POA: Diagnosis not present

## 2019-03-08 DIAGNOSIS — M1990 Unspecified osteoarthritis, unspecified site: Secondary | ICD-10-CM | POA: Diagnosis not present

## 2019-03-08 DIAGNOSIS — F419 Anxiety disorder, unspecified: Secondary | ICD-10-CM | POA: Diagnosis not present

## 2019-03-08 DIAGNOSIS — Z87440 Personal history of urinary (tract) infections: Secondary | ICD-10-CM | POA: Diagnosis not present

## 2019-03-10 DIAGNOSIS — M1990 Unspecified osteoarthritis, unspecified site: Secondary | ICD-10-CM | POA: Diagnosis not present

## 2019-03-10 DIAGNOSIS — F419 Anxiety disorder, unspecified: Secondary | ICD-10-CM | POA: Diagnosis not present

## 2019-03-10 DIAGNOSIS — I6350 Cerebral infarction due to unspecified occlusion or stenosis of unspecified cerebral artery: Secondary | ICD-10-CM | POA: Diagnosis not present

## 2019-03-10 DIAGNOSIS — Z87440 Personal history of urinary (tract) infections: Secondary | ICD-10-CM | POA: Diagnosis not present

## 2019-03-10 DIAGNOSIS — I5022 Chronic systolic (congestive) heart failure: Secondary | ICD-10-CM | POA: Diagnosis not present

## 2019-03-10 DIAGNOSIS — E669 Obesity, unspecified: Secondary | ICD-10-CM | POA: Diagnosis not present

## 2019-03-11 DIAGNOSIS — I6350 Cerebral infarction due to unspecified occlusion or stenosis of unspecified cerebral artery: Secondary | ICD-10-CM | POA: Diagnosis not present

## 2019-03-11 DIAGNOSIS — I5022 Chronic systolic (congestive) heart failure: Secondary | ICD-10-CM | POA: Diagnosis not present

## 2019-03-11 DIAGNOSIS — E669 Obesity, unspecified: Secondary | ICD-10-CM | POA: Diagnosis not present

## 2019-03-11 DIAGNOSIS — M1990 Unspecified osteoarthritis, unspecified site: Secondary | ICD-10-CM | POA: Diagnosis not present

## 2019-03-11 DIAGNOSIS — Z87440 Personal history of urinary (tract) infections: Secondary | ICD-10-CM | POA: Diagnosis not present

## 2019-03-11 DIAGNOSIS — F419 Anxiety disorder, unspecified: Secondary | ICD-10-CM | POA: Diagnosis not present

## 2019-03-16 DIAGNOSIS — I6350 Cerebral infarction due to unspecified occlusion or stenosis of unspecified cerebral artery: Secondary | ICD-10-CM | POA: Diagnosis not present

## 2019-03-16 DIAGNOSIS — Z23 Encounter for immunization: Secondary | ICD-10-CM | POA: Diagnosis not present

## 2019-03-16 DIAGNOSIS — I5022 Chronic systolic (congestive) heart failure: Secondary | ICD-10-CM | POA: Diagnosis not present

## 2019-03-16 DIAGNOSIS — Z87440 Personal history of urinary (tract) infections: Secondary | ICD-10-CM | POA: Diagnosis not present

## 2019-03-16 DIAGNOSIS — E669 Obesity, unspecified: Secondary | ICD-10-CM | POA: Diagnosis not present

## 2019-03-16 DIAGNOSIS — M1990 Unspecified osteoarthritis, unspecified site: Secondary | ICD-10-CM | POA: Diagnosis not present

## 2019-03-16 DIAGNOSIS — F419 Anxiety disorder, unspecified: Secondary | ICD-10-CM | POA: Diagnosis not present

## 2019-03-19 DIAGNOSIS — I6350 Cerebral infarction due to unspecified occlusion or stenosis of unspecified cerebral artery: Secondary | ICD-10-CM | POA: Diagnosis not present

## 2019-03-19 DIAGNOSIS — F419 Anxiety disorder, unspecified: Secondary | ICD-10-CM | POA: Diagnosis not present

## 2019-03-19 DIAGNOSIS — E669 Obesity, unspecified: Secondary | ICD-10-CM | POA: Diagnosis not present

## 2019-03-19 DIAGNOSIS — Z87440 Personal history of urinary (tract) infections: Secondary | ICD-10-CM | POA: Diagnosis not present

## 2019-03-19 DIAGNOSIS — I5022 Chronic systolic (congestive) heart failure: Secondary | ICD-10-CM | POA: Diagnosis not present

## 2019-03-19 DIAGNOSIS — M1990 Unspecified osteoarthritis, unspecified site: Secondary | ICD-10-CM | POA: Diagnosis not present

## 2019-03-22 DIAGNOSIS — Z87440 Personal history of urinary (tract) infections: Secondary | ICD-10-CM | POA: Diagnosis not present

## 2019-03-22 DIAGNOSIS — E669 Obesity, unspecified: Secondary | ICD-10-CM | POA: Diagnosis not present

## 2019-03-22 DIAGNOSIS — I5022 Chronic systolic (congestive) heart failure: Secondary | ICD-10-CM | POA: Diagnosis not present

## 2019-03-22 DIAGNOSIS — F419 Anxiety disorder, unspecified: Secondary | ICD-10-CM | POA: Diagnosis not present

## 2019-03-22 DIAGNOSIS — M1990 Unspecified osteoarthritis, unspecified site: Secondary | ICD-10-CM | POA: Diagnosis not present

## 2019-03-22 DIAGNOSIS — I6350 Cerebral infarction due to unspecified occlusion or stenosis of unspecified cerebral artery: Secondary | ICD-10-CM | POA: Diagnosis not present

## 2019-03-23 DIAGNOSIS — I6350 Cerebral infarction due to unspecified occlusion or stenosis of unspecified cerebral artery: Secondary | ICD-10-CM | POA: Diagnosis not present

## 2019-03-23 DIAGNOSIS — Z87440 Personal history of urinary (tract) infections: Secondary | ICD-10-CM | POA: Diagnosis not present

## 2019-03-23 DIAGNOSIS — F419 Anxiety disorder, unspecified: Secondary | ICD-10-CM | POA: Diagnosis not present

## 2019-03-23 DIAGNOSIS — M1990 Unspecified osteoarthritis, unspecified site: Secondary | ICD-10-CM | POA: Diagnosis not present

## 2019-03-23 DIAGNOSIS — I5022 Chronic systolic (congestive) heart failure: Secondary | ICD-10-CM | POA: Diagnosis not present

## 2019-03-23 DIAGNOSIS — E669 Obesity, unspecified: Secondary | ICD-10-CM | POA: Diagnosis not present

## 2019-03-24 ENCOUNTER — Other Ambulatory Visit: Payer: Self-pay | Admitting: Family Medicine

## 2019-03-25 DIAGNOSIS — M1990 Unspecified osteoarthritis, unspecified site: Secondary | ICD-10-CM | POA: Diagnosis not present

## 2019-03-25 DIAGNOSIS — E669 Obesity, unspecified: Secondary | ICD-10-CM | POA: Diagnosis not present

## 2019-03-25 DIAGNOSIS — Z87440 Personal history of urinary (tract) infections: Secondary | ICD-10-CM | POA: Diagnosis not present

## 2019-03-25 DIAGNOSIS — I5022 Chronic systolic (congestive) heart failure: Secondary | ICD-10-CM | POA: Diagnosis not present

## 2019-03-25 DIAGNOSIS — I6350 Cerebral infarction due to unspecified occlusion or stenosis of unspecified cerebral artery: Secondary | ICD-10-CM | POA: Diagnosis not present

## 2019-03-25 DIAGNOSIS — F419 Anxiety disorder, unspecified: Secondary | ICD-10-CM | POA: Diagnosis not present

## 2019-03-26 DIAGNOSIS — Z23 Encounter for immunization: Secondary | ICD-10-CM | POA: Diagnosis not present

## 2019-03-30 ENCOUNTER — Emergency Department (HOSPITAL_COMMUNITY): Payer: Medicare Other

## 2019-03-30 ENCOUNTER — Other Ambulatory Visit: Payer: Self-pay

## 2019-03-30 ENCOUNTER — Encounter (HOSPITAL_COMMUNITY): Payer: Self-pay | Admitting: *Deleted

## 2019-03-30 ENCOUNTER — Inpatient Hospital Stay (HOSPITAL_COMMUNITY)
Admission: EM | Admit: 2019-03-30 | Discharge: 2019-04-03 | DRG: 871 | Disposition: A | Discharge status: Hospice | Payer: Medicare Other | Attending: Family Medicine | Admitting: Family Medicine

## 2019-03-30 DIAGNOSIS — Z87892 Personal history of anaphylaxis: Secondary | ICD-10-CM | POA: Diagnosis not present

## 2019-03-30 DIAGNOSIS — Z9981 Dependence on supplemental oxygen: Secondary | ICD-10-CM | POA: Diagnosis not present

## 2019-03-30 DIAGNOSIS — I6350 Cerebral infarction due to unspecified occlusion or stenosis of unspecified cerebral artery: Secondary | ICD-10-CM | POA: Diagnosis not present

## 2019-03-30 DIAGNOSIS — F329 Major depressive disorder, single episode, unspecified: Secondary | ICD-10-CM | POA: Diagnosis present

## 2019-03-30 DIAGNOSIS — R069 Unspecified abnormalities of breathing: Secondary | ICD-10-CM | POA: Diagnosis not present

## 2019-03-30 DIAGNOSIS — I10 Essential (primary) hypertension: Secondary | ICD-10-CM | POA: Diagnosis present

## 2019-03-30 DIAGNOSIS — K439 Ventral hernia without obstruction or gangrene: Secondary | ICD-10-CM | POA: Diagnosis present

## 2019-03-30 DIAGNOSIS — A419 Sepsis, unspecified organism: Secondary | ICD-10-CM

## 2019-03-30 DIAGNOSIS — Z515 Encounter for palliative care: Secondary | ICD-10-CM | POA: Diagnosis not present

## 2019-03-30 DIAGNOSIS — Z66 Do not resuscitate: Secondary | ICD-10-CM | POA: Diagnosis present

## 2019-03-30 DIAGNOSIS — I110 Hypertensive heart disease with heart failure: Secondary | ICD-10-CM | POA: Diagnosis present

## 2019-03-30 DIAGNOSIS — F419 Anxiety disorder, unspecified: Secondary | ICD-10-CM | POA: Diagnosis not present

## 2019-03-30 DIAGNOSIS — Z6841 Body Mass Index (BMI) 40.0 and over, adult: Secondary | ICD-10-CM

## 2019-03-30 DIAGNOSIS — Z7401 Bed confinement status: Secondary | ICD-10-CM | POA: Diagnosis not present

## 2019-03-30 DIAGNOSIS — Z91048 Other nonmedicinal substance allergy status: Secondary | ICD-10-CM

## 2019-03-30 DIAGNOSIS — Z881 Allergy status to other antibiotic agents status: Secondary | ICD-10-CM

## 2019-03-30 DIAGNOSIS — R531 Weakness: Secondary | ICD-10-CM | POA: Diagnosis not present

## 2019-03-30 DIAGNOSIS — M1990 Unspecified osteoarthritis, unspecified site: Secondary | ICD-10-CM | POA: Diagnosis present

## 2019-03-30 DIAGNOSIS — Z8673 Personal history of transient ischemic attack (TIA), and cerebral infarction without residual deficits: Secondary | ICD-10-CM

## 2019-03-30 DIAGNOSIS — Z7189 Other specified counseling: Secondary | ICD-10-CM

## 2019-03-30 DIAGNOSIS — E876 Hypokalemia: Secondary | ICD-10-CM | POA: Diagnosis present

## 2019-03-30 DIAGNOSIS — I361 Nonrheumatic tricuspid (valve) insufficiency: Secondary | ICD-10-CM | POA: Diagnosis not present

## 2019-03-30 DIAGNOSIS — Z79899 Other long term (current) drug therapy: Secondary | ICD-10-CM

## 2019-03-30 DIAGNOSIS — Z87440 Personal history of urinary (tract) infections: Secondary | ICD-10-CM | POA: Diagnosis not present

## 2019-03-30 DIAGNOSIS — Z20828 Contact with and (suspected) exposure to other viral communicable diseases: Secondary | ICD-10-CM | POA: Diagnosis present

## 2019-03-30 DIAGNOSIS — Z880 Allergy status to penicillin: Secondary | ICD-10-CM

## 2019-03-30 DIAGNOSIS — F0390 Unspecified dementia without behavioral disturbance: Secondary | ICD-10-CM | POA: Diagnosis present

## 2019-03-30 DIAGNOSIS — M810 Age-related osteoporosis without current pathological fracture: Secondary | ICD-10-CM | POA: Diagnosis present

## 2019-03-30 DIAGNOSIS — Z993 Dependence on wheelchair: Secondary | ICD-10-CM

## 2019-03-30 DIAGNOSIS — Z818 Family history of other mental and behavioral disorders: Secondary | ICD-10-CM

## 2019-03-30 DIAGNOSIS — R0902 Hypoxemia: Secondary | ICD-10-CM | POA: Diagnosis not present

## 2019-03-30 DIAGNOSIS — K219 Gastro-esophageal reflux disease without esophagitis: Secondary | ICD-10-CM | POA: Diagnosis present

## 2019-03-30 DIAGNOSIS — I739 Peripheral vascular disease, unspecified: Secondary | ICD-10-CM | POA: Diagnosis present

## 2019-03-30 DIAGNOSIS — Z886 Allergy status to analgesic agent status: Secondary | ICD-10-CM

## 2019-03-30 DIAGNOSIS — F32A Depression, unspecified: Secondary | ICD-10-CM

## 2019-03-30 DIAGNOSIS — I5022 Chronic systolic (congestive) heart failure: Secondary | ICD-10-CM | POA: Diagnosis not present

## 2019-03-30 DIAGNOSIS — I959 Hypotension, unspecified: Secondary | ICD-10-CM | POA: Diagnosis not present

## 2019-03-30 DIAGNOSIS — J9610 Chronic respiratory failure, unspecified whether with hypoxia or hypercapnia: Secondary | ICD-10-CM | POA: Diagnosis present

## 2019-03-30 DIAGNOSIS — Z888 Allergy status to other drugs, medicaments and biological substances status: Secondary | ICD-10-CM

## 2019-03-30 DIAGNOSIS — R0602 Shortness of breath: Secondary | ICD-10-CM | POA: Diagnosis not present

## 2019-03-30 DIAGNOSIS — E669 Obesity, unspecified: Secondary | ICD-10-CM | POA: Diagnosis not present

## 2019-03-30 DIAGNOSIS — G9341 Metabolic encephalopathy: Secondary | ICD-10-CM | POA: Diagnosis present

## 2019-03-30 DIAGNOSIS — N390 Urinary tract infection, site not specified: Secondary | ICD-10-CM | POA: Diagnosis present

## 2019-03-30 DIAGNOSIS — I509 Heart failure, unspecified: Secondary | ICD-10-CM

## 2019-03-30 DIAGNOSIS — I5032 Chronic diastolic (congestive) heart failure: Secondary | ICD-10-CM | POA: Diagnosis present

## 2019-03-30 DIAGNOSIS — Z885 Allergy status to narcotic agent status: Secondary | ICD-10-CM

## 2019-03-30 HISTORY — DX: Sepsis, unspecified organism: A41.9

## 2019-03-30 LAB — URINALYSIS, ROUTINE W REFLEX MICROSCOPIC
Bacteria, UA: NONE SEEN
Bilirubin Urine: NEGATIVE
Glucose, UA: NEGATIVE mg/dL
Ketones, ur: NEGATIVE mg/dL
Nitrite: POSITIVE — AB
Protein, ur: NEGATIVE mg/dL
Specific Gravity, Urine: 1.014 (ref 1.005–1.030)
WBC, UA: >50 WBC/hpf — ABNORMAL HIGH (ref 0–5)
pH: 7 (ref 5.0–8.0)

## 2019-03-30 LAB — COMPREHENSIVE METABOLIC PANEL
ALT: 12 U/L (ref 0–44)
AST: 14 U/L — ABNORMAL LOW (ref 15–41)
Albumin: 2.9 g/dL — ABNORMAL LOW (ref 3.5–5.0)
Alkaline Phosphatase: 74 U/L (ref 38–126)
Anion gap: 10 (ref 5–15)
BUN: 11 mg/dL (ref 8–23)
CO2: 35 mmol/L — ABNORMAL HIGH (ref 22–32)
Calcium: 8.5 mg/dL — ABNORMAL LOW (ref 8.9–10.3)
Chloride: 92 mmol/L — ABNORMAL LOW (ref 98–111)
Creatinine, Ser: 0.7 mg/dL (ref 0.44–1.00)
GFR calc Af Amer: >60 mL/min (ref >60)
GFR calc non Af Amer: >60 mL/min (ref >60)
Glucose, Bld: 117 mg/dL — ABNORMAL HIGH (ref 70–99)
Potassium: 2.8 mmol/L — ABNORMAL LOW (ref 3.5–5.1)
Sodium: 137 mmol/L (ref 135–145)
Total Bilirubin: 1 mg/dL (ref 0.3–1.2)
Total Protein: 6.5 g/dL (ref 6.5–8.1)

## 2019-03-30 LAB — CBC WITH DIFFERENTIAL/PLATELET
Abs Immature Granulocytes: 0.04 10*3/uL (ref 0.00–0.07)
Basophils Absolute: 0 10*3/uL (ref 0.0–0.1)
Basophils Relative: 0 %
Eosinophils Absolute: 0.1 10*3/uL (ref 0.0–0.5)
Eosinophils Relative: 1 %
HCT: 42.8 % (ref 36.0–46.0)
Hemoglobin: 13.6 g/dL (ref 12.0–15.0)
Immature Granulocytes: 0 %
Lymphocytes Relative: 16 %
Lymphs Abs: 1.6 10*3/uL (ref 0.7–4.0)
MCH: 29.6 pg (ref 26.0–34.0)
MCHC: 31.8 g/dL (ref 30.0–36.0)
MCV: 93.2 fL (ref 80.0–100.0)
Monocytes Absolute: 0.7 10*3/uL (ref 0.1–1.0)
Monocytes Relative: 7 %
Neutro Abs: 7.5 10*3/uL (ref 1.7–7.7)
Neutrophils Relative %: 76 %
Platelets: 180 10*3/uL (ref 150–400)
RBC: 4.59 MIL/uL (ref 3.87–5.11)
RDW: 13.2 % (ref 11.5–15.5)
WBC: 9.9 10*3/uL (ref 4.0–10.5)
nRBC: 0 % (ref 0.0–0.2)

## 2019-03-30 LAB — RESPIRATORY PANEL BY RT PCR (FLU A&B, COVID)
Influenza A by PCR: NEGATIVE
Influenza B by PCR: NEGATIVE
SARS Coronavirus 2 by RT PCR: NEGATIVE

## 2019-03-30 LAB — MAGNESIUM: Magnesium: 1.9 mg/dL (ref 1.7–2.4)

## 2019-03-30 LAB — BLOOD GAS, ARTERIAL
Acid-Base Excess: 14.4 mmol/L — ABNORMAL HIGH (ref 0.0–2.0)
Bicarbonate: 34.9 mmol/L — ABNORMAL HIGH (ref 20.0–28.0)
FIO2: 28
O2 Saturation: 45 %
Patient temperature: 38
pCO2 arterial: 63 mmHg — ABNORMAL HIGH (ref 32.0–48.0)
pH, Arterial: 7.416 (ref 7.350–7.450)
pO2, Arterial: <31 mmHg — CL (ref 83.0–108.0)

## 2019-03-30 LAB — POC SARS CORONAVIRUS 2 AG -  ED: SARS Coronavirus 2 Ag: NEGATIVE

## 2019-03-30 LAB — BRAIN NATRIURETIC PEPTIDE: B Natriuretic Peptide: 506 pg/mL — ABNORMAL HIGH (ref 0.0–100.0)

## 2019-03-30 LAB — PROTIME-INR
INR: 1.1 (ref 0.8–1.2)
Prothrombin Time: 14.1 seconds (ref 11.4–15.2)

## 2019-03-30 LAB — LACTIC ACID, PLASMA: Lactic Acid, Venous: 1.1 mmol/L (ref 0.5–1.9)

## 2019-03-30 LAB — APTT: aPTT: 30 seconds (ref 24–36)

## 2019-03-30 MED ORDER — SODIUM CHLORIDE 0.9% FLUSH: 3.0000 mL | INTRAVENOUS | Status: DC | PRN

## 2019-03-30 MED ORDER — POTASSIUM CHLORIDE 10 MEQ/100ML IV SOLN
10.0000 meq | INTRAVENOUS | Status: AC
  Administered 2019-03-30 (×2): 10 meq via INTRAVENOUS
  Filled 2019-03-30 (×2): qty 100

## 2019-03-30 MED ORDER — IOHEXOL 350 MG/ML SOLN
100.0000 mL | Freq: Once | INTRAVENOUS | Status: AC | PRN
  Administered 2019-03-30: 100 mL via INTRAVENOUS

## 2019-03-30 MED ORDER — FUROSEMIDE 20 MG PO TABS: 20.0000 mg | ORAL_TABLET | Freq: Every day | ORAL | Status: DC

## 2019-03-30 MED ORDER — POTASSIUM CHLORIDE CRYS ER 20 MEQ PO TBCR
40.0000 meq | EXTENDED_RELEASE_TABLET | Freq: Once | ORAL | Status: AC
  Administered 2019-03-30: 40 meq via ORAL
  Filled 2019-03-30: qty 2

## 2019-03-30 MED ORDER — PANTOPRAZOLE SODIUM 40 MG PO TBEC
40.0000 mg | DELAYED_RELEASE_TABLET | Freq: Every day | ORAL | Status: DC
  Administered 2019-03-31 – 2019-04-03 (×4): 40 mg via ORAL
  Filled 2019-03-30 (×5): qty 1

## 2019-03-30 MED ORDER — POLYETHYLENE GLYCOL 3350 17 G PO PACK: 17.0000 g | PACK | Freq: Every day | ORAL | Status: DC | PRN

## 2019-03-30 MED ORDER — MECLIZINE HCL 12.5 MG PO TABS: 12.5000 mg | ORAL_TABLET | Freq: Two times a day (BID) | ORAL | Status: DC | PRN

## 2019-03-30 MED ORDER — VITAMIN D 25 MCG (1000 UNIT) PO TABS
1000.0000 [IU] | ORAL_TABLET | Freq: Every day | ORAL | Status: DC
  Administered 2019-03-31 – 2019-04-03 (×4): 1000 [IU] via ORAL
  Filled 2019-03-30 (×7): qty 1

## 2019-03-30 MED ORDER — BENAZEPRIL HCL 10 MG PO TABS
5.0000 mg | ORAL_TABLET | Freq: Two times a day (BID) | ORAL | Status: DC
  Filled 2019-03-30: qty 0.5

## 2019-03-30 MED ORDER — SODIUM CHLORIDE 0.9 % IV SOLN: 250.0000 mL | INTRAVENOUS | Status: DC | PRN

## 2019-03-30 MED ORDER — SODIUM CHLORIDE 0.9 % IV SOLN
2.0000 g | Freq: Two times a day (BID) | INTRAVENOUS | Status: DC
  Administered 2019-03-31 – 2019-04-01 (×3): 2 g via INTRAVENOUS
  Filled 2019-03-30 (×4): qty 2

## 2019-03-30 MED ORDER — LORAZEPAM 0.5 MG PO TABS: 0.5000 mg | ORAL_TABLET | Freq: Every evening | ORAL | Status: DC | PRN

## 2019-03-30 MED ORDER — LORATADINE 10 MG PO TABS
10.0000 mg | ORAL_TABLET | Freq: Every day | ORAL | Status: DC
  Administered 2019-03-31 – 2019-04-03 (×4): 10 mg via ORAL
  Filled 2019-03-30 (×4): qty 1

## 2019-03-30 MED ORDER — ONDANSETRON HCL 4 MG PO TABS: 4.0000 mg | ORAL_TABLET | Freq: Four times a day (QID) | ORAL | Status: DC | PRN

## 2019-03-30 MED ORDER — BISACODYL 10 MG RE SUPP: 10.0000 mg | Freq: Every day | RECTAL | Status: DC | PRN

## 2019-03-30 MED ORDER — LACTATED RINGERS IV SOLN
INTRAVENOUS | Status: DC
  Administered 2019-03-31 (×2): via INTRAVENOUS

## 2019-03-30 MED ORDER — ONDANSETRON HCL 4 MG/2ML IJ SOLN: 4.0000 mg | Freq: Four times a day (QID) | INTRAMUSCULAR | Status: DC | PRN

## 2019-03-30 MED ORDER — METRONIDAZOLE IN NACL 5-0.79 MG/ML-% IV SOLN
500.0000 mg | Freq: Once | INTRAVENOUS | Status: AC
  Administered 2019-03-30: 500 mg via INTRAVENOUS
  Filled 2019-03-30: qty 100

## 2019-03-30 MED ORDER — SODIUM CHLORIDE 0.9 % IV BOLUS (SEPSIS)
1000.0000 mL | Freq: Once | INTRAVENOUS | Status: AC
  Administered 2019-03-30: 1000 mL via INTRAVENOUS

## 2019-03-30 MED ORDER — ACETAMINOPHEN 325 MG PO TABS: 650.0000 mg | ORAL_TABLET | Freq: Four times a day (QID) | ORAL | Status: DC | PRN

## 2019-03-30 MED ORDER — SODIUM CHLORIDE 0.9 % IV SOLN
2.0000 g | Freq: Once | INTRAVENOUS | Status: AC
  Administered 2019-03-30: 2 g via INTRAVENOUS
  Filled 2019-03-30: qty 2

## 2019-03-30 MED ORDER — ACETAMINOPHEN 500 MG PO TABS
1000.0000 mg | ORAL_TABLET | Freq: Once | ORAL | Status: AC
  Administered 2019-03-30: 1000 mg via ORAL
  Filled 2019-03-30: qty 2

## 2019-03-30 MED ORDER — ENOXAPARIN SODIUM 40 MG/0.4ML ~~LOC~~ SOLN
40.0000 mg | SUBCUTANEOUS | Status: DC
  Administered 2019-03-31 – 2019-04-03 (×4): 40 mg via SUBCUTANEOUS
  Filled 2019-03-30 (×4): qty 0.4

## 2019-03-30 MED ORDER — AEROCHAMBER PLUS FLO-VU MISC
1.0000 | Freq: Once | Status: AC
  Administered 2019-03-30: 1
  Filled 2019-03-30: qty 1

## 2019-03-30 MED ORDER — ALBUTEROL SULFATE HFA 108 (90 BASE) MCG/ACT IN AERS
4.0000 | INHALATION_SPRAY | Freq: Once | RESPIRATORY_TRACT | Status: AC
  Administered 2019-03-30: 4 via RESPIRATORY_TRACT
  Filled 2019-03-30: qty 6.7

## 2019-03-30 MED ORDER — VITAMIN B-12 1000 MCG PO TABS
1000.0000 ug | ORAL_TABLET | Freq: Every day | ORAL | Status: DC
  Administered 2019-03-31 – 2019-04-03 (×4): 1000 ug via ORAL
  Filled 2019-03-30 (×5): qty 1

## 2019-03-30 MED ORDER — PAROXETINE HCL 10 MG PO TABS: 30.0000 mg | ORAL_TABLET | Freq: Every morning | ORAL | Status: DC

## 2019-03-30 MED ORDER — ACETAMINOPHEN 650 MG RE SUPP: 650.0000 mg | Freq: Four times a day (QID) | RECTAL | Status: DC | PRN

## 2019-03-30 MED ORDER — CALCIUM CARBONATE 1250 (500 CA) MG PO TABS
1.0000 | ORAL_TABLET | Freq: Two times a day (BID) | ORAL | Status: DC
  Administered 2019-03-31 – 2019-04-03 (×7): 500 mg via ORAL
  Filled 2019-03-30 (×8): qty 1

## 2019-03-30 MED ORDER — VANCOMYCIN HCL IN DEXTROSE 1-5 GM/200ML-% IV SOLN
1000.0000 mg | Freq: Once | INTRAVENOUS | Status: AC
  Administered 2019-03-30: 1000 mg via INTRAVENOUS
  Filled 2019-03-30: qty 200

## 2019-03-30 MED ORDER — VANCOMYCIN HCL IN DEXTROSE 750-5 MG/150ML-% IV SOLN
750.0000 mg | Freq: Two times a day (BID) | INTRAVENOUS | Status: DC
  Administered 2019-03-31: 750 mg via INTRAVENOUS
  Filled 2019-03-30: qty 150

## 2019-03-30 MED ORDER — VANCOMYCIN HCL 500 MG IV SOLR
500.0000 mg | Freq: Once | INTRAVENOUS | Status: AC
  Administered 2019-03-30: 500 mg via INTRAVENOUS
  Filled 2019-03-30: qty 500

## 2019-03-30 MED ORDER — SODIUM CHLORIDE 0.9% FLUSH
3.0000 mL | Freq: Two times a day (BID) | INTRAVENOUS | Status: DC
  Administered 2019-03-31 – 2019-04-03 (×6): 3 mL via INTRAVENOUS

## 2019-03-30 NOTE — Progress Notes (Signed)
Pharmacy Antibiotic Note  ELEXIS POLLAK is a 85 y.o. female admitted on 03/30/2019 with sepsis.  Pharmacy has been consulted for cefepime and vancomycin dosing.  Plan: Vancomycin 750mg  IV every 12 hours.  Goal trough 15-20 mcg/mL.  Height: 5' (152.4 cm) Weight: 166 lb 3.6 oz (75.4 kg) IBW/kg (Calculated) : 45.5  Temp (24hrs), Avg:100.5 F (38.1 C), Min:100.5 F (38.1 C), Max:100.5 F (38.1 C)  Recent Labs  Lab 03/30/19 1536  WBC 9.9  CREATININE 0.70  LATICACIDVEN 1.1    Estimated Creatinine Clearance: 44.1 mL/min (by C-G formula based on SCr of 0.7 mg/dL).    Allergies  Allergen Reactions  . Propoxyphene Anaphylaxis  . Citalopram Other (See Comments)    Reaction is unknown  . Codeine Other (See Comments)    Altered mental status "Makes Crazy"  . Levaquin [Levofloxacin In D5w] Diarrhea  . Oxycontin [Oxycodone Hcl] Other (See Comments)    Altered mental status  . Ultram [Tramadol]     AMS  . Zoloft [Sertraline Hcl] Other (See Comments)  . Aspirin Rash  . Penicillins Swelling and Rash    Antimicrobials this admission: 12/8 vancomycin >>  12/8 cefepime >>  12/8 metronidazole x 1    Microbiology results: 12/8 BCx: sent 12/8 UCx: sent    Thank you for allowing pharmacy to be a part of this patient's care.  Donna Christen Anelia Carriveau 03/30/2019 5:03 PM

## 2019-03-30 NOTE — ED Triage Notes (Signed)
Pt c/o increasing sob for the past two weeks, denies any fever, denies any cough, recent exposures, pt uses oxygen prn at home,

## 2019-03-30 NOTE — ED Notes (Signed)
Pa notified of blood gas results, questionable results, advise to recollect

## 2019-03-30 NOTE — H&P (Signed)
History and Physical    Megan Fisher IRJ:[id number] DOB: [birth year] DOA: 03/30/2019  PCP: Mechele Claude, MD  Patient coming from: Home  I have personally briefly reviewed patient's old medical records in [facility]  Chief Complaint: Shortness of breath  HPI: Megan Fisher is a 85 y.o. female with medical history significant of chronic congestive heart failure with preserved ejection fraction, dementia, depression, peripheral vascular disease, history of CVA, osteoarthritis, GERD, hypertension.  History is being obtained from the ER as the patient is having dementia and confusion at baseline.  She presented to the ER with history of increased shortness of breath, requiring oxygen at 2 L.  She is also been more lethargic, confused than usual.  Has a history of recurrent UTIs.  No fever reported at home, but however noted to be febrile on arrival to the ER.  No nausea, vomiting and abdominal pain was reported at home. Does have chronic shortness of breath on exertion.  No significant productive cough reported. ED Course:  Vital Signs reviewed on presentation, significant for temperature 100.5, heart rate 76, blood pressure 118/64, saturation 98% on 2 L nasal cannula. Labs reviewed, significant for sodium 137, potassium 2.8, CO2 35, BUN 11, Creat 0.7, LFTs wnl, BNP 506, lactic acid 1.1, WBC 9.9, Hb 13.6, Hct 42.8, urinalysis shows large leukocytes, positive nitrites, more than 50 WBCs, interestingly no bacteria.  Blood and urine cultures have been sent. Imaging personally Reviewed, chest x-ray shows stable cardiomegaly, possible opacity in the left lung base. EKG personally reviewed, shows sinus rhythm, borderline IVCD.    Review of Systems: Review of systems could not be done as the patient has dementia and is confused and lethargic.  Past Medical History:  Diagnosis Date  . Arthritis   . Hernia   . Hypertension   . Obesity   . Vertigo     Past Surgical History:  Procedure Laterality  Date  . BREAST SURGERY    . CHOLECYSTECTOMY    . HERNIA REPAIR    . ooperectomy    . TONSILLECTOMY       reports that she has never smoked. She has never used smokeless tobacco. She reports that she does not drink alcohol or use drugs.  Allergies  Allergen Reactions  . Propoxyphene Anaphylaxis  . Ciprofloxacin Diarrhea  . Citalopram Other (See Comments)    Reaction is unknown  . Codeine Other (See Comments)    Altered mental status "Makes Crazy"  . Levaquin [Levofloxacin In D5w] Diarrhea  . Oxycontin [Oxycodone Hcl] Other (See Comments)    Altered mental status  . Ultram [Tramadol]     AMS  . Zoloft [Sertraline Hcl] Other (See Comments)  . Adhesive [Tape] Itching, Swelling and Rash    Paper tape is ok  . Aspirin Rash  . Penicillins Swelling and Rash    No family history on file. Family history reviewed, noted as above, not pertinent to current presentation.   Prior to Admission medications   Medication Sig Start Date End Date Taking? Authorizing Provider  acetaminophen (TYLENOL) 325 MG tablet Take 650 mg by mouth every 6 (six) hours as needed for mild pain or moderate pain.    Yes [provider]  benazepril (LOTENSIN) 10 MG tablet Take 0.5 tablets (5 mg total) by mouth 2 (two) times daily. 11/20/18  Yes Stacks, Broadus John, MD  calcium carbonate (OS-CAL - DOSED IN MG OF ELEMENTAL CALCIUM) 1250 (500 Ca) MG tablet Take 1 tablet by mouth 2 (two) times  daily with a meal.    Yes [provider]  cholecalciferol (VITAMIN D) 1000 UNITS tablet Take 1,000 Units by mouth daily.   Yes [provider]  Cranberry 450 MG CAPS Take 1 capsule by mouth daily.    Yes [provider]  diclofenac sodium (VOLTAREN) 1 % GEL Apply 2 g topically 4 (four) times daily as needed (for pain).    Yes [provider]  furosemide (LASIX) 20 MG tablet Take 1 tablet (20 mg total) by mouth daily. (Needs to be seen before next refill) Patient taking differently: Take 20  mg by mouth daily.  03/24/19  Yes Stacks, Cletus Gash, MD  loratadine (CLARITIN) 10 MG tablet Take 10 mg by mouth daily.   Yes [provider]  LORazepam (ATIVAN) 0.5 MG tablet Take 1 tablet (0.5 mg total) by mouth at bedtime. Patient taking differently: Take 0.5 mg by mouth at bedtime as needed for sleep.  10/05/18  Yes Stacks, Cletus Gash, MD  meclizine (ANTIVERT) 12.5 MG tablet TAKE 1 TABLET TWICE A DAY AS NEEDED FOR DIZZINESS Patient taking differently: Take 12.5 mg by mouth 2 (two) times daily as needed for dizziness.  02/28/19  Yes Stacks, Cletus Gash, MD  omeprazole (PRILOSEC) 20 MG capsule Take 1 capsule (20 mg total) by mouth daily. 11/17/18  Yes Stacks, Cletus Gash, MD  PARoxetine (PAXIL) 20 MG tablet TAKE 1 TABLET IN MORNING Patient taking differently: Take 30 mg by mouth every morning.  09/21/18  Yes Claretta Fraise, MD  promethazine (PHENERGAN) 25 MG tablet Take 25 mg by mouth every 6 (six) hours as needed for nausea or vomiting.   Yes [provider]  vitamin B-12 (CYANOCOBALAMIN) 1000 MCG tablet Take 1,000 mcg by mouth daily.   Yes [provider]  QUEtiapine (SEROQUEL) 50 MG tablet Take 1 tablet (50 mg total) by mouth at bedtime. Patient not taking: Reported on 03/30/2019 12/03/18   Claretta Fraise, MD    Physical Exam: Vitals:   03/30/19 1630 03/30/19 1700 03/30/19 1816 03/30/19 1900  BP: 122/65 (!) 110/50 105/60 118/64  Pulse: 75 74 75 76  Resp: (!) 29 (!) 28 (!) 24 (!) 21  Temp:   (!) 100.5 F (38.1 C)   TempSrc:      SpO2: 100% 100% 100% 98%  Weight:      Height:        Constitutional: NAD, calm, comfortable Vitals:   03/30/19 1630 03/30/19 1700 03/30/19 1816 03/30/19 1900  BP: 122/65 (!) 110/50 105/60 118/64  Pulse: 75 74 75 76  Resp: (!) 29 (!) 28 (!) 24 (!) 21  Temp:   (!) 100.5 F (38.1 C)   TempSrc:      SpO2: 100% 100% 100% 98%  Weight:      Height:       Eyes: PERRL, lids and conjunctivae normal ENMT: Mucous membranes are moist. Posterior pharynx  clear of any exudate or lesions.Normal dentition.  Neck: normal, supple, no masses, no thyromegaly Respiratory: clear to auscultation bilaterally, no wheezing, no crackles. Normal respiratory effort. No accessory muscle use.  Port-A-Cath on right chest wall. Cardiovascular: Regular rate and rhythm, no murmurs / rubs / gallops. No extremity edema. 2+ pedal pulses. No carotid bruits.  Abdomen: no tenderness, no masses palpated. No hepatosplenomegaly. Bowel sounds positive.  Musculoskeletal: no clubbing / cyanosis. No joint deformity upper and lower extremities. Good ROM, no contractures. Normal muscle tone.  Skin: no rashes, lesions, ulcers. No induration Neurologic: No obvious cranial nerve deficits, withdraws all 4  extremities to pain.  No obvious focal deficits. Psychiatric: Judgment and insight impaired, lethargic, drowsy, not oriented to time place or person.   Decubitus Ulcers: Not present on admission Catheters and tubes: None   Labs on Admission: I have personally reviewed following labs and imaging studies  CBC: Recent Labs  Lab 03/30/19 1536  WBC 9.9  NEUTROABS 7.5  HGB 13.6  HCT 42.8  MCV 93.2  PLT 180   Basic Metabolic Panel: Recent Labs  Lab 03/30/19 1536 03/30/19 1537  NA 137  --   K 2.8*  --   CL 92*  --   CO2 35*  --   GLUCOSE 117*  --   BUN 11  --   CREATININE 0.70  --   CALCIUM 8.5*  --   MG  --  1.9   GFR: Estimated Creatinine Clearance: 44.1 mL/min (by C-G formula based on SCr of 0.7 mg/dL). Liver Function Tests: Recent Labs  Lab 03/30/19 1536  AST 14*  ALT 12  ALKPHOS 74  BILITOT 1.0  PROT 6.5  ALBUMIN 2.9*   No results for input(s): LIPASE, AMYLASE in the last 168 hours. No results for input(s): AMMONIA in the last 168 hours. Coagulation Profile: Recent Labs  Lab 03/30/19 1537  INR 1.1   Cardiac Enzymes: No results for input(s): CKTOTAL, CKMB, CKMBINDEX, TROPONINI in the last 168 hours. BNP (last 3 results) No results for  input(s): PROBNP in the last 8760 hours. HbA1C: No results for input(s): HGBA1C in the last 72 hours. CBG: No results for input(s): GLUCAP in the last 168 hours. Lipid Profile: No results for input(s): CHOL, HDL, LDLCALC, TRIG, CHOLHDL, LDLDIRECT in the last 72 hours. Thyroid Function Tests: No results for input(s): TSH, T4TOTAL, FREET4, T3FREE, THYROIDAB in the last 72 hours. Anemia Panel: No results for input(s): VITAMINB12, FOLATE, FERRITIN, TIBC, IRON, RETICCTPCT in the last 72 hours. Urine analysis:    Component Value Date/Time   COLORURINE YELLOW 03/30/2019 1500   APPEARANCEUR HAZY (A) 03/30/2019 1500   LABSPEC 1.014 03/30/2019 1500   PHURINE 7.0 03/30/2019 1500   GLUCOSEU NEGATIVE 03/30/2019 1500   HGBUR MODERATE (A) 03/30/2019 1500   BILIRUBINUR NEGATIVE 03/30/2019 1500   BILIRUBINUR neg 10/31/2014 1645   KETONESUR NEGATIVE 03/30/2019 1500   PROTEINUR NEGATIVE 03/30/2019 1500   UROBILINOGEN negative 10/31/2014 1645   UROBILINOGEN 0.2 10/06/2014 1920   NITRITE POSITIVE (A) 03/30/2019 1500   LEUKOCYTESUR LARGE (A) 03/30/2019 1500    Radiological Exams on Admission: Dg Chest Port 1 View  Result Date: 03/30/2019 CLINICAL DATA:  SOB, PER ER NOTE, Pt c/o increasing sob for the past two weeks, denies any fever, denies any cough, recent exposures, pt uses oxygen prn at home, HISTORY OF HTN EXAM: PORTABLE CHEST 1 VIEW COMPARISON:  11/18/2018 FINDINGS: RIGHT-sided PowerPort tip overlies the superior vena cava. Heart is enlarged and stable in configuration. There is minimal opacity at the LATERAL LEFT lung base. This appear stable and may represent atelectasis or opacity from cardiomegaly. No definite new consolidations. No pulmonary edema. IMPRESSION: 1. Stable cardiomegaly. 2. Stable opacity at the LEFT lung base. Electronically Signed   By: Norva PavlovElizabeth  Brown M.D.   On: 03/30/2019 15:24      Assessment/Plan Principal Problem:   Sepsis (HCC) Active Problems:   Hypertension    Ventral hernia   UTI (urinary tract infection)   Depression   Dementia (HCC)   History of CVA (cerebrovascular accident)     Principal Problem: Sepsis possibly secondary to UTI  Patient presented with fever, mild tachypnea.  No tachycardia leukocytosis noted.  Lactic acid is normal.  SARS-CoV-2 RT-PCR is negative. Urinalysis showed more than 50 WBCs, large leukocytes, positive nitrites interestingly no bacteriuria noted.  Has a history of recurrent UTIs in the past. SARS Covid RT-PCR is negative.  No obvious evidence of pneumonia on chest x-ray. Plan: We will place on IV antibiotics Monitor CBC, temp, will send procalcitonin  Other Active Problems:  Hypokalemia: Possibly secondary to diuretic use at home. Monitor and supplement electrolytes Telemetry monitoring Check magnesium level  Altered Mental Status: Patient noted to have worsening mental status with baseline dementia.  She is more lethargic and drowsy. Monitor neurological signs Head CT has been ordered.  Chronic hypoxemic and hypercapnic respiratory failure: Patient has a 2-week history of shortness of breath.  Usually on as needed oxygen at home.  Requiring 2 L nasal cannula on arrival. Patient noted to have mild hypercapnia with compensation.  pH is 7.41 on presentation.  PCO2 63. No obvious evidence of pneumonia on chest x-ray.  BNP is relatively stable.  CT angiogram of the chest is negative for PE. We will continue oxygen as needed with continuous pulse oximetry. Monitor blood gases.  Chronic congestive heart failure with preserved ejection fraction Patient is currently euvolemic Continue Lasix Monitor input output, renal function, daily weights  Hypertension: Continue benazepril  Dementia: Likely Alzheimer's type Currently noted to be more lethargic and drowsy.  Will hold Seroquel for now.  Depression: Hold Paxil due to AMS  Gastroesophageal reflux disease: Continue omeprazole  Osteoporosis:   Continue cholecalciferol, calcium  DVT prophylaxis: Lovenox Code Status:  Full code for now, will need discussion with patient and family regarding advanced directives Family Communication: N/A  Disposition Plan: Admitted as inpatient for sepsis likely secondary to UTI Consults called: N/A Admission status: Inpatient status   [facility] Cyndie Chime MD [facility]  If 7PM-7AM, please contact night-coverage   03/30/2019, 7:29 PM

## 2019-03-30 NOTE — ED Provider Notes (Addendum)
[name] Provider Note   CSN: [id number] Arrival date & time: 03/30/19  1425     History   Chief Complaint Chief Complaint  Patient presents with   Shortness of Breath    HPI Megan Fisher is a 85 y.o. female.     Megan Fisher is a 85 y.o. female with a history of CHF, HTN, vertigo and obesity, who presents via EMS for evaluation of increasing shortness of breath over the past two weeks. Patient reports she has oxygen to use at home as needed, but has been using it for most of the day over the past few days. Shortness of breath is present at rest but worse with exertion. Pt is primarily bed bound lives at home and has two care takers that help her at home.  Patient denies any associated chest pain.  She has had an occasional nonproductive cough.  Patient denies any fevers but is febrile on arrival, she was unaware of this.  She denies any abdominal pain, vomiting or diarrhea.  Has not had any dysuria or urinary frequency, but does have a history of frequent UTIs.  Patient states she is not sure what she uses oxygen for at home, but her daughter states this is due to heart failure and that she has actually been seeing palliative care for her heart failure, but daughter wanted her to come in to be evaluated due to significantly worsened shortness of breath over the past few days.  Denies any known sick contacts.  [name] also states that she feels like her mom has been a bit more confused over the last few days with some dementia at baseline.  No history of COPD or smoking.     Past Medical History:  Diagnosis Date   Arthritis    Hernia    Hypertension    Obesity    Vertigo     Patient Active Problem List   Diagnosis Date Noted   Sepsis (HCC) 03/30/2019   UTI (urinary tract infection) 03/30/2019   Depression 03/30/2019   Dementia (HCC) 03/30/2019   History of CVA (cerebrovascular accident) 03/30/2019   Urine frequency 05/10/2013    Arthritis    Hypertension    Vertigo    Obesity    Ventral hernia     Past Surgical History:  Procedure Laterality Date   BREAST SURGERY     CHOLECYSTECTOMY     HERNIA REPAIR     ooperectomy     TONSILLECTOMY       OB History   No obstetric history on file.      Home Medications    Prior to Admission medications   Medication Sig Start Date End Date Taking? Authorizing Provider  acetaminophen (TYLENOL) 325 MG tablet Take 650 mg by mouth every 6 (six) hours as needed for mild pain or moderate pain.    Yes [provider]  benazepril (LOTENSIN) 10 MG tablet Take 0.5 tablets (5 mg total) by mouth 2 (two) times daily. 11/20/18  Yes Stacks, Broadus John, MD  calcium carbonate (OS-CAL - DOSED IN MG OF ELEMENTAL CALCIUM) 1250 (500 Ca) MG tablet Take 1 tablet by mouth 2 (two) times daily with a meal.    Yes [provider]  cholecalciferol (VITAMIN D) 1000 UNITS tablet Take 1,000 Units by mouth daily.   Yes [provider]  Cranberry 450 MG CAPS Take 1 capsule by mouth daily.    Yes [provider]  diclofenac sodium (VOLTAREN) 1 %  GEL Apply 2 g topically 4 (four) times daily as needed (for pain).    Yes [provider]  furosemide (LASIX) 20 MG tablet Take 1 tablet (20 mg total) by mouth daily. (Needs to be seen before next refill) Patient taking differently: Take 20 mg by mouth daily.  03/24/19  Yes Stacks, Broadus John, MD  loratadine (CLARITIN) 10 MG tablet Take 10 mg by mouth daily.   Yes [provider]  LORazepam (ATIVAN) 0.5 MG tablet Take 1 tablet (0.5 mg total) by mouth at bedtime. Patient taking differently: Take 0.5 mg by mouth at bedtime as needed for sleep.  10/05/18  Yes Stacks, Broadus John, MD  meclizine (ANTIVERT) 12.5 MG tablet TAKE 1 TABLET TWICE A DAY AS NEEDED FOR DIZZINESS Patient taking differently: Take 12.5 mg by mouth 2 (two) times daily as needed for dizziness.  02/28/19  Yes Stacks, Broadus John, MD  omeprazole (PRILOSEC)  20 MG capsule Take 1 capsule (20 mg total) by mouth daily. 11/17/18  Yes Stacks, Broadus John, MD  PARoxetine (PAXIL) 20 MG tablet TAKE 1 TABLET IN MORNING Patient taking differently: Take 30 mg by mouth every morning.  09/21/18  Yes Mechele Claude, MD  promethazine (PHENERGAN) 25 MG tablet Take 25 mg by mouth every 6 (six) hours as needed for nausea or vomiting.   Yes [provider]  vitamin B-12 (CYANOCOBALAMIN) 1000 MCG tablet Take 1,000 mcg by mouth daily.   Yes [provider]  QUEtiapine (SEROQUEL) 50 MG tablet Take 1 tablet (50 mg total) by mouth at bedtime. Patient not taking: Reported on 03/30/2019 12/03/18   Mechele Claude, MD    Family History No family history on file.  Social History Social History   Tobacco Use   Smoking status: Never Smoker   Smokeless tobacco: Never Used  Substance Use Topics   Alcohol use: No   Drug use: No     Allergies   Propoxyphene, Ciprofloxacin, Citalopram, Codeine, Levaquin [levofloxacin in d5w], Oxycontin [oxycodone hcl], Ultram [tramadol], Zoloft [sertraline hcl], Adhesive [tape], Aspirin, and Penicillins   Review of Systems Review of Systems  Constitutional: Positive for fatigue. Negative for chills and fever.  HENT: Negative.   Respiratory: Positive for shortness of breath.   Cardiovascular: Negative for chest pain.  Gastrointestinal: Negative for abdominal pain, diarrhea, nausea and vomiting.  Genitourinary: Negative for dysuria and frequency.  Musculoskeletal: Negative for arthralgias and myalgias.  Skin: Negative for color change and rash.  Neurological: Negative for dizziness, weakness, light-headedness and numbness.  All other systems reviewed and are negative.    Physical Exam Updated Vital Signs BP 115/62    Pulse 72    Temp (!) 100.5 F (38.1 C) (Oral)    Resp (!) 28    Ht 5' (1.524 m)    Wt 75.4 kg    SpO2 96%    BMI 32.46 kg/m   Physical Exam Vitals signs and nursing note reviewed.  Constitutional:        General: She is not in acute distress.    Appearance: She is obese. She is ill-appearing. She is not diaphoretic.     Comments: Elderly obese female, chronically ill appearing  HENT:     Head: Normocephalic and atraumatic.     Mouth/Throat:     Mouth: Mucous membranes are moist.     Pharynx: Oropharynx is clear.  Eyes:     General:        Right eye: No discharge.        Left eye: No  discharge.  Neck:     Musculoskeletal: Neck supple.  Cardiovascular:     Rate and Rhythm: Normal rate and regular rhythm.     Heart sounds: No murmur. No friction rub. No gallop.   Pulmonary:     Effort: Tachypnea present. No respiratory distress.     Breath sounds: Normal breath sounds. No wheezing or rales.     Comments: Pt tachypneic with increased respiratory effort but satting well on 2 L nasal cannula, lungs clear to auscultation without wheezes, rales or rhonchi. Abdominal:     General: Bowel sounds are normal. There is no distension.     Palpations: Abdomen is soft. There is no mass.     Tenderness: There is no abdominal tenderness. There is no guarding.     Comments: Abdomen soft, nondistended, nontender to palpation in all quadrants without guarding or peritoneal signs  Musculoskeletal:        General: No deformity.     Comments: No edema in bilateral lower extremities  Skin:    General: Skin is warm and dry.     Capillary Refill: Capillary refill takes less than 2 seconds.  Neurological:     Mental Status: She is alert.     Coordination: Coordination normal.     Comments: Speech is clear, able to follow commands Moves extremities without ataxia, coordination intact  Psychiatric:        Mood and Affect: Mood normal.        Behavior: Behavior normal.      ED Treatments / Results  Labs (all labs ordered are listed, but only abnormal results are displayed) Labs Reviewed  COMPREHENSIVE METABOLIC PANEL - Abnormal; Notable for the following components:      Result Value    Potassium 2.8 (*)    Chloride 92 (*)    CO2 35 (*)    Glucose, Bld 117 (*)    Calcium 8.5 (*)    Albumin 2.9 (*)    AST 14 (*)    All other components within normal limits  BRAIN NATRIURETIC PEPTIDE - Abnormal; Notable for the following components:   B Natriuretic Peptide 506.0 (*)    All other components within normal limits  BLOOD GAS, ARTERIAL - Abnormal; Notable for the following components:   pCO2 arterial 63.0 (*)    pO2, Arterial <31 (*)    Bicarbonate 34.9 (*)    Acid-Base Excess 14.4 (*)    All other components within normal limits  URINALYSIS, ROUTINE W REFLEX MICROSCOPIC - Abnormal; Notable for the following components:   APPearance HAZY (*)    Hgb urine dipstick MODERATE (*)    Nitrite POSITIVE (*)    Leukocytes,Ua LARGE (*)    WBC, UA >50 (*)    All other components within normal limits  CULTURE, BLOOD (ROUTINE X 2)  CULTURE, BLOOD (ROUTINE X 2)  RESPIRATORY PANEL BY RT PCR (FLU A&B, COVID)  URINE CULTURE  CBC WITH DIFFERENTIAL/PLATELET  LACTIC ACID, PLASMA  APTT  PROTIME-INR  MAGNESIUM  POC SARS CORONAVIRUS 2 AG -  ED    EKG None  Radiology Ct Angio Chest Pe W And/or Wo Contrast  Result Date: 03/30/2019 CLINICAL DATA:  Shortness of breath. EXAM: CT ANGIOGRAPHY CHEST WITH CONTRAST TECHNIQUE: Multidetector CT imaging of the chest was performed using the standard protocol during bolus administration of intravenous contrast. Multiplanar CT image reconstructions and MIPs were obtained to evaluate the vascular anatomy. CONTRAST:  173mL OMNIPAQUE IOHEXOL 350 MG/ML SOLN COMPARISON:  Report of  January 23, 2005. Images not available for comparison. FINDINGS: Cardiovascular: Satisfactory opacification of the pulmonary arteries to the segmental level. No evidence of pulmonary embolism. Mild cardiomegaly is noted. Atherosclerosis of thoracic aorta is noted without aneurysm or dissection. No pericardial effusion. Mediastinum/Nodes: The esophagus is unremarkable. No adenopathy  is noted. Large peripherally calcified left thyroid mass is noted which causes tracheal deviation to the right. Reportedly this was present on the prior exam of 2006. Lungs/Pleura: No pneumothorax or pleural effusion is noted. Minimal left basilar subsegmental atelectasis is noted. Upper Abdomen: No acute abnormality. Musculoskeletal: No chest wall abnormality. No acute or significant osseous findings. Review of the MIP images confirms the above findings. IMPRESSION: No definite evidence of pulmonary embolus is noted. Continued presence of large left-sided thyroid peripherally calcified mass is noted. This was present on prior exam of 2006 and most likely is benign. Aortic Atherosclerosis (ICD10-I70.0). Electronically Signed   By: Lupita Raider M.D.   On: 03/30/2019 21:44   Dg Chest Port 1 View  Result Date: 03/30/2019 CLINICAL DATA:  SOB, PER ER NOTE, Pt c/o increasing sob for the past two weeks, denies any fever, denies any cough, recent exposures, pt uses oxygen prn at home, HISTORY OF HTN EXAM: PORTABLE CHEST 1 VIEW COMPARISON:  11/18/2018 FINDINGS: RIGHT-sided PowerPort tip overlies the superior vena cava. Heart is enlarged and stable in configuration. There is minimal opacity at the LATERAL LEFT lung base. This appear stable and may represent atelectasis or opacity from cardiomegaly. No definite new consolidations. No pulmonary edema. IMPRESSION: 1. Stable cardiomegaly. 2. Stable opacity at the LEFT lung base. Electronically Signed   By: Norva Pavlov M.D.   On: 03/30/2019 15:24    Procedures .Critical Care Performed by: Dartha Lodge, PA-C Authorized by: Dartha Lodge, PA-C   Critical care provider statement:    Critical care time (minutes):  45   Critical care was necessary to treat or prevent imminent or life-threatening deterioration of the following conditions:  Sepsis   Critical care was time spent personally by me on the following activities:  Discussions with consultants,  evaluation of patient's response to treatment, examination of patient, ordering and performing treatments and interventions, ordering and review of laboratory studies, ordering and review of radiographic studies, pulse oximetry, re-evaluation of patient's condition, obtaining history from patient or surrogate and review of old charts   (including critical care time)  Medications Ordered in ED Medications  ceFEPIme (MAXIPIME) 2 g in sodium chloride 0.9 % 100 mL IVPB (2 g Intravenous Not Given 03/30/19 1736)  vancomycin (VANCOCIN) IVPB 750 mg/150 ml premix (750 mg Intravenous Not Given 03/30/19 1737)  potassium chloride 10 mEq in 100 mL IVPB (10 mEq Intravenous New Bag/Given 03/30/19 2003)  albuterol (VENTOLIN HFA) 108 (90 Base) MCG/ACT inhaler 4 puff (4 puffs Inhalation Given 03/30/19 1552)  aerochamber plus with mask device 1 each (1 each Other Given 03/30/19 1553)  ceFEPIme (MAXIPIME) 2 g in sodium chloride 0.9 % 100 mL IVPB (0 g Intravenous Stopped 03/30/19 1640)  metroNIDAZOLE (FLAGYL) IVPB 500 mg (0 mg Intravenous Stopped 03/30/19 1816)  vancomycin (VANCOCIN) IVPB 1000 mg/200 mL premix (0 mg Intravenous Stopped 03/30/19 1730)  sodium chloride 0.9 % bolus 1,000 mL (0 mLs Intravenous Stopped 03/30/19 1816)  vancomycin (VANCOCIN) 500 mg in sodium chloride 0.9 % 100 mL IVPB (0 mg Intravenous Stopped 03/30/19 1907)  potassium chloride SA (KLOR-CON) CR tablet 40 mEq (40 mEq Oral Given 03/30/19 1818)  acetaminophen (TYLENOL) tablet 1,000 mg (1,000  mg Oral Given 03/30/19 2003)     Initial Impression / Assessment and Plan / ED Course  I have reviewed the triage vital signs and the nursing notes.  Pertinent labs & imaging results that were available during my care of the patient were reviewed by me and considered in my medical decision making (see chart for details).  85 year old female presents with 2 weeks of shortness of breath progressively worsening, noted to be febrile and tachypneic on arrival  requiring 2 L O2, has as needed home oxygen for shortness of breath with exertion related to heart failure.  Patient was not aware that she was febrile she denies any chest pain associated with her shortness of breath has had an occasional nonproductive cough.  Is unsure of any Covid exposures.  [name] also reports the patient seems more confused and fatigued than normal.  Denies any abdominal pain or urinary symptoms but does have a history of recurrent UTIs.  Code sepsis initiated with broad-spectrum antibiotics.  We will also get rapid Covid test and Covid inflammatory markers as well as BNP.  EKG without ischemic changes and patient is not experiencing any chest pain, so unlikely ACS will hold off on troponin.  1 L IV fluids ordered, given heart failure without recent echo do not want to cause fluid overload and worsening shortness of breath.  Patient has no hypotension and lactic acid is not elevated, no signs of severe sepsis.  Chest x-ray shows stable cardiomegaly and stable opacity within the left lung base felt likely to be atelectasis, or opacity from cardiomegaly.  No infiltrates noted.  EKG shows sinus rhythm without concerning ischemic changes.  Lab work shows no leukocytosis.  Hypokalemia of 2.8 noted, oral and IV potassium replacement ordered, no hypomagnesemia.  Creatinine and LFTs unremarkable.  Patient with CO2 of 35, ABG is likely a venous stick but shows chronic CO2 retention, but normal pH.  Rapid Covid test is negative, confirmatory PCR collected.  BNP somewhat elevated at 506.  Urinalysis with positive nitrates and leukocytes and greater than 50 WBCs consistent with infection, culture sent.  This is likely the source of patient's fever and sepsis, she is already been covered with broad-spectrum antibiotics.  Patient shortness of breath is still not explained, will get CT angio to rule out PE.  Question whether this may also be related to patient's heart function, she would  likely benefit from echo. Deconditioning may also be contributing to SOB.  CT angio without evidence of pulmonary embolism, continued presence of large left-sided thyroid mass, unchanged from 2006 and felt to be benign.  Shortness of breath can be further worked up during inpatient admission.  Case discussed with Dr. Teodoro KilGadhia with [facility] hospitalist who will see and admit the patient.  Final Clinical Impressions(s) / ED Diagnoses   Final diagnoses:  Sepsis secondary to UTI (HCC)  SOB (shortness of breath)    ED Discharge Orders    None       Legrand RamsFord, Ellisha Bankson N, PA-C 03/31/19 1703    Bethann BerkshireZammit, Joseph, MD 04/02/19 0910    Dartha LodgeFord, Mikaelah Trostle N, PA-C 04/19/19 2208    Bethann BerkshireZammit, Joseph, MD 04/28/19 1031

## 2019-03-31 ENCOUNTER — Inpatient Hospital Stay (HOSPITAL_COMMUNITY): Payer: Medicare Other

## 2019-03-31 ENCOUNTER — Encounter (HOSPITAL_COMMUNITY): Payer: Self-pay | Admitting: Family Medicine

## 2019-03-31 DIAGNOSIS — Z66 Do not resuscitate: Secondary | ICD-10-CM | POA: Diagnosis present

## 2019-03-31 DIAGNOSIS — Z8673 Personal history of transient ischemic attack (TIA), and cerebral infarction without residual deficits: Secondary | ICD-10-CM

## 2019-03-31 LAB — BLOOD GAS, ARTERIAL
Acid-Base Excess: 9.5 mmol/L — ABNORMAL HIGH (ref 0.0–2.0)
Bicarbonate: 32.5 mmol/L — ABNORMAL HIGH (ref 20.0–28.0)
Drawn by: 22223
FIO2: 21
O2 Saturation: 98.6 %
Patient temperature: 37
pCO2 arterial: 53.2 mmHg — ABNORMAL HIGH (ref 32.0–48.0)
pH, Arterial: 7.425 (ref 7.350–7.450)
pO2, Arterial: 109 mmHg — ABNORMAL HIGH (ref 83.0–108.0)

## 2019-03-31 LAB — CBC
HCT: 38.2 % (ref 36.0–46.0)
Hemoglobin: 12 g/dL (ref 12.0–15.0)
MCH: 29.5 pg (ref 26.0–34.0)
MCHC: 31.4 g/dL (ref 30.0–36.0)
MCV: 93.9 fL (ref 80.0–100.0)
Platelets: 130 10*3/uL — ABNORMAL LOW (ref 150–400)
RBC: 4.07 MIL/uL (ref 3.87–5.11)
RDW: 12.9 % (ref 11.5–15.5)
WBC: 7 10*3/uL (ref 4.0–10.5)
nRBC: 0 % (ref 0.0–0.2)

## 2019-03-31 LAB — GLUCOSE, CAPILLARY
Glucose-Capillary: 95 mg/dL (ref 70–99)
Glucose-Capillary: 140 mg/dL — ABNORMAL HIGH (ref 70–99)
Glucose-Capillary: 83 mg/dL (ref 70–99)

## 2019-03-31 LAB — MRSA PCR SCREENING: MRSA by PCR: NEGATIVE

## 2019-03-31 LAB — BASIC METABOLIC PANEL
Anion gap: 9 (ref 5–15)
BUN: 12 mg/dL (ref 8–23)
CO2: 33 mmol/L — ABNORMAL HIGH (ref 22–32)
Calcium: 8.4 mg/dL — ABNORMAL LOW (ref 8.9–10.3)
Chloride: 98 mmol/L (ref 98–111)
Creatinine, Ser: 0.71 mg/dL (ref 0.44–1.00)
GFR calc Af Amer: >60 mL/min (ref >60)
GFR calc non Af Amer: >60 mL/min (ref >60)
Glucose, Bld: 99 mg/dL (ref 70–99)
Potassium: 2.8 mmol/L — ABNORMAL LOW (ref 3.5–5.1)
Sodium: 140 mmol/L (ref 135–145)

## 2019-03-31 LAB — MAGNESIUM: Magnesium: 1.7 mg/dL (ref 1.7–2.4)

## 2019-03-31 MED ORDER — POTASSIUM CHLORIDE 20 MEQ PO PACK
40.0000 meq | PACK | ORAL | Status: AC
  Administered 2019-03-31 (×3): 40 meq via ORAL
  Filled 2019-03-31 (×3): qty 2

## 2019-03-31 MED ORDER — POTASSIUM CHLORIDE 10 MEQ/100ML IV SOLN
10.0000 meq | INTRAVENOUS | Status: AC
  Administered 2019-03-31 (×4): 10 meq via INTRAVENOUS
  Filled 2019-03-31: qty 100

## 2019-03-31 NOTE — Progress Notes (Addendum)
PROGRESS NOTE [facility]   Webb SilversmithJanie W Hanf  WUJ:[id number]:[id number]  DOB: 11/10/1928  DOA: 03/30/2019 PCP: Mechele ClaudeStacks, Warren, MD   Brief Admission Hx: 85 y.o. female on home hospice with medical history significant of chronic congestive heart failure with preserved ejection fraction, dementia, depression, peripheral vascular disease, history of CVA, osteoarthritis, GERD, hypertension.  History is being obtained from the ER as the patient is having dementia and confusion at baseline.  She presented to the ER with history of increased shortness of breath, requiring oxygen at 2 L.  She was found to have a UTI.   MDM/Assessment & Plan:   1. Sepsis secondary to UTI - Covid 19 testing has been negative.  IV antibiotics for UTI. Follow culture and sensitivities.  Continue supportive care.  2. Hypokalemia - oral replacement ordered.   3. DNR on admission - I have been informed by care management that patient is active with home hospice services.  4. Chronic respiratory failure - she is home oxygen dependent and will remain on supplemental oxygen.  5. HFpEF - resumed home medications she is on lasix .   6. Dementia - temporarily held seroquel due to lethargy but will need to resume in next 1-2 days.  7. Depression - pt had been on paxil which is temporarily held.  8. GERD - continue PPI therapy.  9. AMS - likely multifactorial but as above we are limiting sedating medications. 10. Osteoporosis- continue vit D and calcium.    DVT prophylaxis: lovenox  Code Status: DNR on home hospice Family Communication: daughter telephone  Disposition Plan: inpatient    Consultants:    Procedures:    Antimicrobials:     Subjective: Pt reports that she feels a little better.  She is awake and able to answer questions. She is confused and has obvious dementia.   Objective: Vitals:   03/30/19 2330 03/30/19 2345 03/31/19 0054 03/31/19 0621  BP: (!) 119/57 111/60 (!) 96/48 130/63  Pulse: 66 70 65 70    Resp: (!) 26 (!) 25 20 18   Temp:   98.5 F (36.9 C) 99.1 F (37.3 C)  TempSrc:   Oral Oral  SpO2: 100% 98% 100%   Weight:   109.2 kg 109.2 kg  Height:   5' (1.524 m)     Intake/Output Summary (Last 24 hours) at 03/31/2019 1442 Last data filed at 03/31/2019 0600 Gross per 24 hour  Intake 375.85 ml  Output --  Net 375.85 ml   Filed Weights   03/30/19 1433 03/31/19 0054 03/31/19 0621  Weight: 75.4 kg 109.2 kg 109.2 kg     REVIEW OF SYSTEMS  As per history otherwise all reviewed and reported negative  Exam:  General exam: awake, alert, no apparent distress. Cooperative.  Respiratory system:  No increased work of breathing. Cardiovascular system: S1 & S2 heard. No JVD, murmurs, gallops, clicks or pedal edema. Gastrointestinal system: Abdomen is nondistended, soft and nontender. Normal bowel sounds heard. Central nervous system: Alert and oriented to person only.  No focal neurological deficits. Extremities: no CCE.  Data Reviewed: Basic Metabolic Panel: Recent Labs  Lab 03/30/19 1536 03/30/19 1537 03/31/19 0524  NA 137  --  140  K 2.8*  --  2.8*  CL 92*  --  98  CO2 35*  --  33*  GLUCOSE 117*  --  99  BUN 11  --  12  CREATININE 0.70  --  0.71  CALCIUM 8.5*  --  8.4*  MG  --  1.9  --    Liver Function Tests: Recent Labs  Lab 03/30/19 1536  AST 14*  ALT 12  ALKPHOS 74  BILITOT 1.0  PROT 6.5  ALBUMIN 2.9*   No results for input(s): LIPASE, AMYLASE in the last 168 hours. No results for input(s): AMMONIA in the last 168 hours. CBC: Recent Labs  Lab 03/30/19 1536 03/31/19 0524  WBC 9.9 7.0  NEUTROABS 7.5  --   HGB 13.6 12.0  HCT 42.8 38.2  MCV 93.2 93.9  PLT 180 130*   Cardiac Enzymes: No results for input(s): CKTOTAL, CKMB, CKMBINDEX, TROPONINI in the last 168 hours. CBG (last 3)  Recent Labs    03/31/19 0041 03/31/19 1132  GLUCAP 95 140*   Recent Results (from the past 240 hour(s))  Culture, blood (routine x 2)     Status: None  (Preliminary result)   Collection Time: 03/30/19  3:37 PM   Specimen: Left Antecubital; Blood  Result Value Ref Range Status   Specimen Description LEFT ANTECUBITAL  Final   Special Requests   Final    BOTTLES DRAWN AEROBIC AND ANAEROBIC Blood Culture adequate volume   Culture   Final    NO GROWTH < 24 HOURS Performed at [facility], [address], [place], [place] [address]    Report Status PENDING  Incomplete  Culture, blood (routine x 2)     Status: None (Preliminary result)   Collection Time: 03/30/19  3:37 PM   Specimen: BLOOD  Result Value Ref Range Status   Specimen Description BLOOD PORT  Final   Special Requests   Final    BOTTLES DRAWN AEROBIC AND ANAEROBIC Blood Culture adequate volume   Culture   Final    NO GROWTH < 24 HOURS Performed at [facility], [address] [place], [place] [address]    Report Status PENDING  Incomplete  Respiratory Panel by RT PCR (Flu A&B, Covid) - Nasopharyngeal Swab     Status: None   Collection Time: 03/30/19  4:19 PM   Specimen: Nasopharyngeal Swab  Result Value Ref Range Status   SARS Coronavirus 2 by RT PCR NEGATIVE NEGATIVE Final    Comment: (NOTE) SARS-CoV-2 target nucleic acids are NOT DETECTED. The SARS-CoV-2 RNA is generally detectable in upper respiratoy specimens during the acute phase of infection. The lowest concentration of SARS-CoV-2 viral copies this assay can detect is 131 copies/mL. A negative result does not preclude SARS-Cov-2 infection and should not be used as the sole basis for treatment or other patient management decisions. A negative result may occur with  improper specimen collection/handling, submission of specimen other than nasopharyngeal swab, presence of viral mutation(s) within the areas targeted by this assay, and inadequate number of viral copies (<131 copies/mL). A negative result must be combined with clinical observations, patient history, and epidemiological information. The expected result is  Negative. Fact Sheet for Patients:  PinkCheek.be Fact Sheet for Healthcare Providers:  GravelBags.it This test is not yet ap proved or cleared by the [place] FDA and  has been authorized for detection and/or diagnosis of SARS-CoV-2 by FDA under an Emergency Use Authorization (EUA). This EUA will remain  in effect (meaning this test can be used) for the duration of the COVID-19 declaration under Section 564(b)(1) of the Act, 21 U.S.C. section 360bbb-3(b)(1), unless the authorization is terminated or revoked sooner.    Influenza A by PCR NEGATIVE NEGATIVE Final   Influenza B by PCR NEGATIVE NEGATIVE Final    Comment: (NOTE) The  Xpert Xpress SARS-CoV-2/FLU/RSV assay is intended as an aid in  the diagnosis of influenza from Nasopharyngeal swab specimens and  should not be used as a sole basis for treatment. Nasal washings and  aspirates are unacceptable for Xpert Xpress SARS-CoV-2/FLU/RSV  testing. Fact Sheet for Patients: https://www.moore.com/ Fact Sheet for Healthcare Providers: https://www.young.biz/ This test is not yet approved or cleared by the [place] FDA and  has been authorized for detection and/or diagnosis of SARS-CoV-2 by  FDA under an Emergency Use Authorization (EUA). This EUA will remain  in effect (meaning this test can be used) for the duration of the  Covid-19 declaration under Section 564(b)(1) of the Act, 21  U.S.C. section 360bbb-3(b)(1), unless the authorization is  terminated or revoked. Performed at [facility], [address] [place], [place] [address]   MRSA PCR Screening     Status: None   Collection Time: 03/31/19 11:53 AM   Specimen: Nasopharyngeal  Result Value Ref Range Status   MRSA by PCR NEGATIVE NEGATIVE Final    Comment:        The GeneXpert MRSA Assay (FDA approved for NASAL specimens only), is one component of a comprehensive MRSA  colonization surveillance program. It is not intended to diagnose MRSA infection nor to guide or monitor treatment for MRSA infections. Performed at [facility], [address], [place], [place] [address]      Studies: Ct Head Wo Contrast  Result Date: 03/31/2019 CLINICAL DATA:  Altered level of consciousness EXAM: CT HEAD WITHOUT CONTRAST TECHNIQUE: Contiguous axial images were obtained from the base of the skull through the vertex without intravenous contrast. COMPARISON:  November 01, 2014 FINDINGS: Brain: No evidence of acute infarction, hemorrhage, hydrocephalus, extra-axial collection or mass lesion/mass effect. Atrophy and chronic microvascular ischemic changes are noted. Vascular: No hyperdense vessel or unexpected calcification. Skull: Normal. Negative for fracture or focal lesion. Sinuses/Orbits: No acute finding. Other: None. IMPRESSION: No acute intracranial abnormality. Electronically Signed   By: Katherine Mantle M.D.   On: 03/31/2019 02:21   Ct Angio Chest Pe W And/or Wo Contrast  Result Date: 03/30/2019 CLINICAL DATA:  Shortness of breath. EXAM: CT ANGIOGRAPHY CHEST WITH CONTRAST TECHNIQUE: Multidetector CT imaging of the chest was performed using the standard protocol during bolus administration of intravenous contrast. Multiplanar CT image reconstructions and MIPs were obtained to evaluate the vascular anatomy. CONTRAST:  OMNIPAQUE IOHEXOL 350 MG/ML SOLN COMPARISON:  Report of January 23, 2005. Images not available for comparison. FINDINGS: Cardiovascular: Satisfactory opacification of the pulmonary arteries to the segmental level. No evidence of pulmonary embolism. Mild cardiomegaly is noted. Atherosclerosis of thoracic aorta is noted without aneurysm or dissection. No pericardial effusion. Mediastinum/Nodes: The esophagus is unremarkable. No adenopathy is noted. Large peripherally calcified left thyroid mass is noted which causes tracheal deviation to the right. Reportedly  this was present on the prior exam of 2006. Lungs/Pleura: No pneumothorax or pleural effusion is noted. Minimal left basilar subsegmental atelectasis is noted. Upper Abdomen: No acute abnormality. Musculoskeletal: No chest wall abnormality. No acute or significant osseous findings. Review of the MIP images confirms the above findings. IMPRESSION: No definite evidence of pulmonary embolus is noted. Continued presence of large left-sided thyroid peripherally calcified mass is noted. This was present on prior exam of 2006 and most likely is benign. Aortic Atherosclerosis (ICD10-I70.0). Electronically Signed   By: Lupita Raider M.D.   On: 03/30/2019 21:44   Dg Chest Port 1 View  Result Date: 03/30/2019 CLINICAL DATA:  SOB,  PER ER NOTE, Pt c/o increasing sob for the past two weeks, denies any fever, denies any cough, recent exposures, pt uses oxygen prn at home, HISTORY OF HTN EXAM: PORTABLE CHEST 1 VIEW COMPARISON:  11/18/2018 FINDINGS: RIGHT-sided PowerPort tip overlies the superior vena cava. Heart is enlarged and stable in configuration. There is minimal opacity at the LATERAL LEFT lung base. This appear stable and may represent atelectasis or opacity from cardiomegaly. No definite new consolidations. No pulmonary edema. IMPRESSION: 1. Stable cardiomegaly. 2. Stable opacity at the LEFT lung base. Electronically Signed   By: Norva Pavlov M.D.   On: 03/30/2019 15:24     Scheduled Meds:  calcium carbonate  1 tablet Oral BID WC   cholecalciferol  1,000 Units Oral Daily   enoxaparin (LOVENOX) injection  40 mg Subcutaneous Q24H   loratadine  10 mg Oral Daily   pantoprazole  40 mg Oral Daily   sodium chloride flush  3 mL Intravenous Q12H   vitamin B-12  1,000 mcg Oral Daily   Continuous Infusions:  sodium chloride     ceFEPime (MAXIPIME) IV 2 g (03/31/19 0447)   lactated ringers 75 mL/hr at 03/31/19 0223   vancomycin 750 mg (03/31/19 0524)    Principal Problem:   Sepsis  (HCC) Active Problems:   Hypertension   Ventral hernia   UTI (urinary tract infection)   Depression   Dementia (HCC)   History of CVA (cerebrovascular accident)   Time spent:   Standley Dakins, MD [facility] 03/31/2019, 2:42 PM    LOS: 1 day  How to contact the [facility] Attending or Consulting provider 7A - 7P or covering provider during after hours 7P -7A, for this patient?  1. Check the care team in [facility] and look for a) attending/consulting [facility] provider listed and b) the [facility] team listed 2. Log into [email or web address] and use 's universal password to access. If you do not have the password, please contact the hospital operator. 3. Locate the [facility] provider you are looking for under [facility] and page to a number that you can be directly reached. 4. If you still have difficulty reaching the provider, please page the [facility] (Director on Call) for the Hospitalists listed on amion for assistance.

## 2019-04-01 ENCOUNTER — Inpatient Hospital Stay (HOSPITAL_COMMUNITY): Payer: Medicare Other

## 2019-04-01 DIAGNOSIS — R531 Weakness: Secondary | ICD-10-CM

## 2019-04-01 DIAGNOSIS — I361 Nonrheumatic tricuspid (valve) insufficiency: Secondary | ICD-10-CM

## 2019-04-01 DIAGNOSIS — I509 Heart failure, unspecified: Secondary | ICD-10-CM

## 2019-04-01 DIAGNOSIS — Z515 Encounter for palliative care: Secondary | ICD-10-CM

## 2019-04-01 DIAGNOSIS — Z7189 Other specified counseling: Secondary | ICD-10-CM

## 2019-04-01 DIAGNOSIS — Z66 Do not resuscitate: Secondary | ICD-10-CM

## 2019-04-01 DIAGNOSIS — A419 Sepsis, unspecified organism: Principal | ICD-10-CM

## 2019-04-01 DIAGNOSIS — F0390 Unspecified dementia without behavioral disturbance: Secondary | ICD-10-CM

## 2019-04-01 DIAGNOSIS — N390 Urinary tract infection, site not specified: Secondary | ICD-10-CM

## 2019-04-01 LAB — CBC WITH DIFFERENTIAL/PLATELET
Abs Immature Granulocytes: 0.01 10*3/uL (ref 0.00–0.07)
Basophils Absolute: 0 10*3/uL (ref 0.0–0.1)
Basophils Relative: 0 %
Eosinophils Absolute: 0.2 10*3/uL (ref 0.0–0.5)
Eosinophils Relative: 4 %
HCT: 38.8 % (ref 36.0–46.0)
Hemoglobin: 12.1 g/dL (ref 12.0–15.0)
Immature Granulocytes: 0 %
Lymphocytes Relative: 28 %
Lymphs Abs: 1.5 10*3/uL (ref 0.7–4.0)
MCH: 29.4 pg (ref 26.0–34.0)
MCHC: 31.2 g/dL (ref 30.0–36.0)
MCV: 94.2 fL (ref 80.0–100.0)
Monocytes Absolute: 0.5 10*3/uL (ref 0.1–1.0)
Monocytes Relative: 8 %
Neutro Abs: 3.3 10*3/uL (ref 1.7–7.7)
Neutrophils Relative %: 60 %
Platelets: 148 10*3/uL — ABNORMAL LOW (ref 150–400)
RBC: 4.12 MIL/uL (ref 3.87–5.11)
RDW: 13.2 % (ref 11.5–15.5)
WBC: 5.5 10*3/uL (ref 4.0–10.5)
nRBC: 0 % (ref 0.0–0.2)

## 2019-04-01 LAB — COMPREHENSIVE METABOLIC PANEL
ALT: 12 U/L (ref 0–44)
AST: 14 U/L — ABNORMAL LOW (ref 15–41)
Albumin: 2.6 g/dL — ABNORMAL LOW (ref 3.5–5.0)
Alkaline Phosphatase: 59 U/L (ref 38–126)
Anion gap: 10 (ref 5–15)
BUN: 11 mg/dL (ref 8–23)
CO2: 30 mmol/L (ref 22–32)
Calcium: 8.7 mg/dL — ABNORMAL LOW (ref 8.9–10.3)
Chloride: 100 mmol/L (ref 98–111)
Creatinine, Ser: 0.64 mg/dL (ref 0.44–1.00)
GFR calc Af Amer: >60 mL/min (ref >60)
GFR calc non Af Amer: >60 mL/min (ref >60)
Glucose, Bld: 98 mg/dL (ref 70–99)
Potassium: 3.5 mmol/L (ref 3.5–5.1)
Sodium: 140 mmol/L (ref 135–145)
Total Bilirubin: 0.4 mg/dL (ref 0.3–1.2)
Total Protein: 5.7 g/dL — ABNORMAL LOW (ref 6.5–8.1)

## 2019-04-01 LAB — GLUCOSE, CAPILLARY
Glucose-Capillary: 89 mg/dL (ref 70–99)
Glucose-Capillary: 90 mg/dL (ref 70–99)
Glucose-Capillary: 90 mg/dL (ref 70–99)

## 2019-04-01 LAB — ECHOCARDIOGRAM COMPLETE
Height: 60 in
Weight: 3876.57 oz

## 2019-04-01 LAB — URINE CULTURE: Culture: <10000 — AB

## 2019-04-01 LAB — MAGNESIUM: Magnesium: 1.8 mg/dL (ref 1.7–2.4)

## 2019-04-01 MED ORDER — ALBUTEROL SULFATE (2.5 MG/3ML) 0.083% IN NEBU
2.5000 mg | INHALATION_SOLUTION | Freq: Three times a day (TID) | RESPIRATORY_TRACT | Status: DC
  Administered 2019-04-02: 2.5 mg via RESPIRATORY_TRACT
  Filled 2019-04-01 (×2): qty 3

## 2019-04-01 MED ORDER — ALBUTEROL SULFATE (2.5 MG/3ML) 0.083% IN NEBU
2.5000 mg | INHALATION_SOLUTION | Freq: Four times a day (QID) | RESPIRATORY_TRACT | Status: DC
  Administered 2019-04-01 (×2): 2.5 mg via RESPIRATORY_TRACT

## 2019-04-01 MED ORDER — CHLORHEXIDINE GLUCONATE CLOTH 2 % EX PADS
6.0000 | MEDICATED_PAD | Freq: Every day | CUTANEOUS | Status: DC
  Administered 2019-04-01 – 2019-04-03 (×3): 6 via TOPICAL

## 2019-04-01 MED ORDER — HYDROXYZINE HCL 10 MG PO TABS
10.0000 mg | ORAL_TABLET | Freq: Three times a day (TID) | ORAL | Status: DC | PRN
  Administered 2019-04-01: 10 mg via ORAL
  Filled 2019-04-01: qty 1

## 2019-04-01 MED ORDER — SODIUM CHLORIDE 0.9 % IV SOLN
1.0000 g | INTRAVENOUS | Status: AC
  Administered 2019-04-01 – 2019-04-02 (×2): 1 g via INTRAVENOUS
  Filled 2019-04-01 (×2): qty 10

## 2019-04-01 NOTE — Consult Note (Signed)
Consultation Note Date: 04/01/2019   Patient Name: Megan SilversmithJanie W Fisher  DOB: 03/25/1929  MRN: [id number]  Age / Sex: 85 y.o., female  PCP: Mechele ClaudeStacks, Warren, MD Referring Physician: Cleora FleetJohnson, Clanford L, MD  Reason for Consultation: Establishing goals of care  HPI/Patient Profile: 85 y.o. female  with past medical history of mild dementia, CHF, CVA, PVD, depression, osteoarthritis, GERD< hypertension admitted on 03/30/2019 with increased shortness of breath requiring 2L oxygen. Patient found to be septic secondary to UTI. IV antibiotics initiated. Chronic respiratory failure and HFpEF, on home lasix. Patient was receiving hospice services at home prior to admit. Palliative medicine consultation for goals of care.    Clinical Assessment and Goals of Care:  I have reviewed medical records, discussed with Dr. Laural BenesJohnson and RN, and assessed the patient at bedside. Ms. Megan Fisher is awake, oriented to self but otherwise disoriented. Asking for lunch. Appears mildly short of breath at rest, currently on 0.5L nasal cannula. Denies pain. No family at bedside.   Spoke with daughter, Megan Fisher to discuss goals of care.   I introduced Palliative Medicine as specialized medical care for people living with serious illness. It focuses on providing relief from the symptoms and stress of a serious illness. The goal is to improve quality of life for both the patient and the family.  We discussed a brief life review of the patient. Megan CelesteJanie [facility] about [facility]. In March 2019, Megan Fisher was able to have her discharged home with support of 24/7 caregivers. Baseline, she is wheelchair/bedbound and with worsening dementia. CHF diagnosis for about 5 years. In July, home hospice services were started.   For the last few weeks, daughter has noticed increased shortness of breath, especially with minimal exertion/turns in bed. The hospice  agency did not work-up the dyspnea and was recommending prn morphine, understandably so with hospice philosophy. Megan Fisher has been very concerned about her mother's breathing, therefore brought her to the hospital for work-up.   Discussed course of hospitalization including diagnoses, interventions, and plan of care. Reviewed CXR, CT, labs, meds with daughter. Daughter visited yesterday afternoon, and is still worried about her mother's respiratory status, even though there are no acute findings. Daughter is wondering if there are other tests that need to be performed. (Possible ECHO?--defer to attending). We did discuss likelihood of progression of heart failure, and medical management/oxygen support with worsening heart failure. Reassured daughter that I would discuss with attending.   I attempted to elicit values and goals of care important to the patient and daughter. Advanced directives, concepts specific to code status, artifical feeding and hydration, and rehospitalization were considered and discussed. Daughter confirms decision for DNR code status, sharing that her mother made this decision with hospice staff. Agreed that this is good limitation in place, with underlying age, frailty, chronic irreversible conditions, and fear that aggressive measures at EOL would only cause pain/suffering for her mother. Megan Fisher understands and agrees. We discussed importance of quality of life for her mother. Her mother's depression has  been worsening. Hospice provider recently increased Paxil.   Discussed plan moving forward. Megan Fisher has been WC/bedbound for about 4 years. Megan Fisher and I agreed that sending her to SNF for rehab would not be appropriate. Megan Fisher would not tolerate intense physical therapy. At this point, Megan Fisher is interested in home health physical therapy, understanding this means she could not continue hospice services on discharge. Megan Fisher understands she can re-enroll her mother in hospice services at any  time. She states "I'm not ready to leave her on morphine to lay there." She wishes for her mother to attempt home physical therapy. Megan Fisher wants what is "best for her, for as long as possible."   Questions and concerns were addressed. PMT contact information given.    SUMMARY OF RECOMMENDATIONS    DNR, otherwise continue medical management. Treat the treatable. Daughter is concerned about patient's respiratory status and although tests are negative for acute findings, patient remains short of breath at rest. Daughter would like further work-up if indicated. Defer to attending. Updated Dr. Wynetta Fisher. CHF progression/trajectory was discussed with daughter.   PT evaluation pending. Daughter interested in home health/physical therapy on discharge, understanding hospice services will be discontinued at this time. Patient has 24/7 caregiver support.   May benefit from outpatient palliative referral.   Code Status/Advance Care Planning:  DNR  Symptom Management:   Per attending  Palliative Prophylaxis:   Aspiration, Bowel Regimen, Delirium Protocol, Frequent Pain Assessment, Oral Care and Turn Reposition  Psycho-social/Spiritual:   Desire for further Chaplaincy support:yes  Additional Recommendations: Caregiving  Support/Resources, Compassionate Wean Education and Education on Hospice  Prognosis:   Unable to determine  Discharge Planning: To Be Determined      Primary Diagnoses: Present on Admission: . Hypertension . Ventral hernia . DNR (do not resuscitate)   I have reviewed the medical record, interviewed the patient and family, and examined the patient. The following aspects are pertinent.  Past Medical History:  Diagnosis Date  . Arthritis   . Hernia   . Hypertension   . Obesity   . Vertigo    Social History   Socioeconomic History  . Marital status: Widowed    Spouse name: Not on file  . Number of children: Not on file  . Years of education: Not on file  .  Highest education level: Not on file  Occupational History  . Not on file  Tobacco Use  . Smoking status: Never Smoker  . Smokeless tobacco: Never Used  Substance and Sexual Activity  . Alcohol use: No  . Drug use: No  . Sexual activity: Not on file  Other Topics Concern  . Not on file  Social History Narrative  . Not on file   Social Determinants of Health   Financial Resource Strain:   . Difficulty of Paying Living Expenses: Not on file  Food Insecurity:   . Worried About Charity fundraiser in the Last Year: Not on file  . Ran Out of Food in the Last Year: Not on file  Transportation Needs:   . Lack of Transportation (Medical): Not on file  . Lack of Transportation (Non-Medical): Not on file  Physical Activity:   . Days of Exercise per Week: Not on file  . Minutes of Exercise per Session: Not on file  Stress:   . Feeling of Stress : Not on file  Social Connections:   . Frequency of Communication with Friends and Family: Not on file  . Frequency of Social Gatherings with Friends  and Family: Not on file  . Attends Religious Services: Not on file  . Active Member of Clubs or Organizations: Not on file  . Attends Banker Meetings: Not on file  . Marital Status: Not on file   History reviewed. No pertinent family history. Scheduled Meds: . calcium carbonate  1 tablet Oral BID WC  . Chlorhexidine Gluconate Cloth  6 each Topical Daily  . cholecalciferol  1,000 Units Oral Daily  . enoxaparin (LOVENOX) injection  40 mg Subcutaneous Q24H  . loratadine  10 mg Oral Daily  . pantoprazole  40 mg Oral Daily  . sodium chloride flush  3 mL Intravenous Q12H  . vitamin B-12  1,000 mcg Oral Daily   Continuous Infusions: . sodium chloride    . ceFEPime (MAXIPIME) IV 2 g (04/01/19 1210)  . lactated ringers 10 mL/hr at 04/01/19 1148   PRN Meds:.sodium chloride, acetaminophen **OR** acetaminophen, bisacodyl, meclizine, ondansetron **OR** ondansetron (ZOFRAN) IV,  polyethylene glycol, sodium chloride flush Medications Prior to Admission:  Prior to Admission medications   Medication Sig Start Date End Date Taking? Authorizing Provider  acetaminophen (TYLENOL) 325 MG tablet Take 650 mg by mouth every 6 (six) hours as needed for mild pain or moderate pain.    Yes [provider]  benazepril (LOTENSIN) 10 MG tablet Take 0.5 tablets (5 mg total) by mouth 2 (two) times daily. 11/20/18  Yes Stacks, Broadus John, MD  calcium carbonate (OS-CAL - DOSED IN MG OF ELEMENTAL CALCIUM) 1250 (500 Ca) MG tablet Take 1 tablet by mouth 2 (two) times daily with a meal.    Yes [provider]  cholecalciferol (VITAMIN D) 1000 UNITS tablet Take 1,000 Units by mouth daily.   Yes [provider]  Cranberry 450 MG CAPS Take 1 capsule by mouth daily.    Yes [provider]  diclofenac sodium (VOLTAREN) 1 % GEL Apply 2 g topically 4 (four) times daily as needed (for pain).    Yes [provider]  furosemide (LASIX) 20 MG tablet Take 1 tablet (20 mg total) by mouth daily. (Needs to be seen before next refill) Patient taking differently: Take 20 mg by mouth daily.  03/24/19  Yes Stacks, Broadus John, MD  loratadine (CLARITIN) 10 MG tablet Take 10 mg by mouth daily.   Yes [provider]  LORazepam (ATIVAN) 0.5 MG tablet Take 1 tablet (0.5 mg total) by mouth at bedtime. Patient taking differently: Take 0.5 mg by mouth at bedtime as needed for sleep.  10/05/18  Yes Stacks, Broadus John, MD  meclizine (ANTIVERT) 12.5 MG tablet TAKE 1 TABLET TWICE A DAY AS NEEDED FOR DIZZINESS Patient taking differently: Take 12.5 mg by mouth 2 (two) times daily as needed for dizziness.  02/28/19  Yes Stacks, Broadus John, MD  omeprazole (PRILOSEC) 20 MG capsule Take 1 capsule (20 mg total) by mouth daily. 11/17/18  Yes Stacks, Broadus John, MD  PARoxetine (PAXIL) 20 MG tablet TAKE 1 TABLET IN MORNING Patient taking differently: Take 30 mg by mouth every morning.  09/21/18  Yes Mechele Claude, MD  promethazine (PHENERGAN) 25 MG tablet Take 25 mg by mouth every 6 (six) hours as needed for nausea or vomiting.   Yes [provider]  vitamin B-12 (CYANOCOBALAMIN) 1000 MCG tablet Take 1,000 mcg by mouth daily.   Yes [provider]  QUEtiapine (SEROQUEL) 50 MG tablet Take 1 tablet (50 mg total) by mouth at bedtime. Patient not taking: Reported on 03/30/2019 12/03/18   Mechele Claude, MD  Allergies  Allergen Reactions  . Propoxyphene Anaphylaxis  . Ciprofloxacin Diarrhea  . Citalopram Other (See Comments)    Reaction is unknown  . Codeine Other (See Comments)    Altered mental status "Makes Crazy"  . Levaquin [Levofloxacin In D5w] Diarrhea  . Oxycontin [Oxycodone Hcl] Other (See Comments)    Altered mental status  . Ultram [Tramadol]     AMS  . Zoloft [Sertraline Hcl] Other (See Comments)  . Adhesive [Tape] Itching, Swelling and Rash    Paper tape is ok  . Aspirin Rash  . Penicillins Swelling and Rash   Review of Systems  Unable to perform ROS: Dementia   Physical Exam Vitals and nursing note reviewed.  Constitutional:      General: She is awake.  HENT:     Head: Normocephalic and atraumatic.  Cardiovascular:     Rate and Rhythm: Regular rhythm.  Pulmonary:     Breath sounds: Wheezing present.     Comments: Mild dyspnea at rest. 0.5L [place]. No distress Skin:    General: Skin is warm and dry.  Neurological:     Mental Status: She is alert.     Comments: Oriented to self. Confused with baseline dementia.    Vital Signs: BP 136/71   Pulse 82   Temp 98 F (36.7 C) (Oral)   Resp 18   Ht 5' (1.524 m)   Wt 109.9 kg   SpO2 93%   BMI 47.32 kg/m  Pain Scale: 0-10   Pain Score: 0-No pain   SpO2: SpO2: 93 % O2 Device:SpO2: 93 % O2 Flow Rate: .O2 Flow Rate (L/min): 2 L/min  IO: Intake/output summary:   Intake/Output Summary (Last 24 hours) at 04/01/2019 1314 Last data filed at 04/01/2019 1100 Gross per 24 hour  Intake 220 ml  Output  -  Net 220 ml    LBM: Last BM Date: 04/01/19 Baseline Weight: Weight: 75.4 kg Most recent weight: Weight: 109.9 kg     Palliative Assessment/Data: PPS 40%   Flowsheet Rows     Most Recent Value  Intake Tab  Referral Department  Hospitalist  Unit at Time of Referral  Med/Surg Unit  Palliative Care Primary Diagnosis  Cardiac  Palliative Care Type  New Palliative care  Reason for referral  Clarify Goals of Care  Date first seen by Palliative Care  04/01/19  Clinical Assessment  Palliative Performance Scale Score  40%  Psychosocial & Spiritual Assessment  Palliative Care Outcomes  Patient/Family meeting held?  Yes  Who was at the meeting?  daughter  Palliative Care Outcomes  Clarified goals of care, Counseled regarding hospice, Provided end of life care assistance, Provided psychosocial or spiritual support, ACP counseling assistance, Linked to palliative care logitudinal support      Time In: 1000 Time Out: 1110 Time Total: 70 Greater than 50%  of this time was spent counseling and coordinating care related to the above assessment and plan.  Signed by:  Vennie Homans, DNP, FNP-C Palliative Medicine Team  Phone: [phone] Fax: [phone]   Please contact Palliative Medicine Team phone at [phone] for questions and concerns.  For individual provider: See Loretha Stapler

## 2019-04-01 NOTE — Progress Notes (Addendum)
PROGRESS NOTE [facility]  Webb SilversmithJanie W Silsby  ZHY:[id number]  DOB: 11/19/1928  DOA: 03/30/2019 PCP: Mechele ClaudeStacks, Warren, MD  Brief Admission Hx: 85 y.o. female on home hospice with medical history significant of chronic congestive heart failure with preserved ejection fraction, dementia, depression, peripheral vascular disease, history of CVA, osteoarthritis, GERD, hypertension.  History is being obtained from the ER as the patient is having dementia and confusion at baseline.  She presented to the ER with history of increased shortness of breath, requiring oxygen at 2 L.  She was found to have a UTI.   MDM/Assessment & Plan:   Sepsis secondary to UTI - Covid 19 testing has been negative.  IV antibiotics for UTI. Follow culture and sensitivities.  Continue supportive care.  Hypokalemia - oral replacement ordered.   DNR on admission - I have been informed by care management that patient is active with home hospice services but after palliative care discussed with family they do not want to resume hospice service and want to have home health services and have 24/7 caretakers in place for patient.   Chronic respiratory failure - she is home oxygen dependent and will remain on supplemental oxygen.  HFpEF - resumed home medications she is on lasix .   Dementia - no behavior disturbances.  Depression - pt had been on paxil which is temporarily held.  GERD - continue PPI therapy.  acute metabolic encephalopathy- likely multifactorial but as above we are limiting sedating medications. Osteoporosis- continue vit D and calcium.    - BMI 47.32, morbid obesity  DVT prophylaxis: lovenox  Code Status: DNR on home hospice Family Communication: daughter telephone  [facility]  Possible home tomorrow.   Consultants:   Procedures:   Antimicrobials:    Subjective: Pt reports no specific complaints, she remains confused with dementia.    Objective: Vitals:   03/31/19 0621 03/31/19 1500 03/31/19 2147 04/01/19 0500  BP: 130/63 102/60 129/78 136/71  Pulse: 70 65 80 82  Resp: 18 18 20 18   Temp: 99.1 F (37.3 C) 98.4 F (36.9 C) 99 F (37.2 C) 98 F (36.7 C)  TempSrc: Oral  Oral Oral  SpO2:  98% 95% 93%  Weight: 109.2 kg   109.9 kg  Height:        Intake/Output Summary (Last 24 hours) at 04/01/2019 1235 Last data filed at 03/31/2019 1859 Gross per 24 hour  Intake 120 ml  Output --  Net 120 ml   Filed Weights   03/31/19 0054 03/31/19 0621 04/01/19 0500  Weight: 109.2 kg 109.2 kg 109.9 kg    REVIEW OF SYSTEMS  Unable to obtain, due to confusion/dementia  Exam:  General exam: awake, confused with dementia, no apparent distress. Cooperative.  Respiratory system:  No increased work of breathing. Cardiovascular system: S1 & S2 heard. No JVD, murmurs, gallops, clicks or pedal edema. Gastrointestinal system: Abdomen is nondistended, soft and nontender. Normal bowel sounds heard. Central nervous system: Alert and oriented to person only.  No focal neurological deficits. Extremities: no CCE.  Data Reviewed: Basic Metabolic Panel: Recent Labs  Lab 03/30/19 1536 03/30/19 1537 03/31/19 0524 04/01/19 0755  NA 137  --  140 140  K 2.8*  --  2.8* 3.5  CL 92*  --  98 100  CO2 35*  --  33* 30  GLUCOSE 117*  --  99 98  BUN 11  --  12 11  CREATININE 0.70  --  0.71 0.64  CALCIUM 8.5*  --  8.4* 8.7*  MG  --  1.9 1.7 1.8   Liver Function Tests: Recent Labs  Lab 03/30/19 1536 04/01/19 0755  AST 14* 14*  ALT 12 12  ALKPHOS 74 59  BILITOT 1.0 0.4  PROT 6.5 5.7*  ALBUMIN 2.9* 2.6*   No results for input(s): LIPASE, AMYLASE in the last 168 hours. No results for input(s): AMMONIA in the last 168 hours. CBC: Recent Labs  Lab 03/30/19 1536 03/31/19 0524 04/01/19 0755  WBC 9.9 7.0 5.5  NEUTROABS 7.5  --  3.3  HGB 13.6 12.0 12.1  HCT 42.8 38.2 38.8  MCV 93.2 93.9 94.2  PLT 180 130* 148*   Cardiac Enzymes: No results for  input(s): CKTOTAL, CKMB, CKMBINDEX, TROPONINI in the last 168 hours. CBG (last 3)  Recent Labs    04/01/19 0026 04/01/19 0729 04/01/19 1057  GLUCAP 90 90 89   Recent Results (from the past 240 hour(s))  Urine culture     Status: Abnormal   Collection Time: 03/30/19  3:00 PM   Specimen: Urine, Random  Result Value Ref Range Status   Specimen Description   Final    URINE, RANDOM Performed at [facility], [address] [place], [place] [address]    Special Requests   Final    NONE Performed at [facility], [address], [place], [place] [address]    Culture (A)  Final    <10,000 COLONIES/mL INSIGNIFICANT GROWTH Performed at [facility], [address], [place], [place] [address]    Report Status 04/01/2019 FINAL  Final  Culture, blood (routine x 2)     Status: None (Preliminary result)   Collection Time: 03/30/19  3:37 PM   Specimen: Left Antecubital; Blood  Result Value Ref Range Status   Specimen Description LEFT ANTECUBITAL  Final   Special Requests   Final    BOTTLES DRAWN AEROBIC AND ANAEROBIC Blood Culture adequate volume   Culture   Final    NO GROWTH 2 DAYS Performed at [facility], [address] [place], [place] [address]    Report Status PENDING  Incomplete  Culture, blood (routine x 2)     Status: None (Preliminary result)   Collection Time: 03/30/19  3:37 PM   Specimen: BLOOD  Result Value Ref Range Status   Specimen Description BLOOD PORT  Final   Special Requests   Final    BOTTLES DRAWN AEROBIC AND ANAEROBIC Blood Culture adequate volume   Culture   Final    NO GROWTH 2 DAYS Performed at [facility], [address] [place], [place] [address]    Report Status PENDING  Incomplete  Respiratory Panel by RT PCR (Flu A&B, Covid) - Nasopharyngeal Swab     Status: None   Collection Time: 03/30/19  4:19 PM   Specimen: Nasopharyngeal Swab  Result Value Ref Range Status   SARS Coronavirus 2 by RT PCR NEGATIVE NEGATIVE Final    Comment:  (NOTE) SARS-CoV-2 target nucleic acids are NOT DETECTED. The SARS-CoV-2 RNA is generally detectable in upper respiratoy specimens during the acute phase of infection. The lowest concentration of SARS-CoV-2 viral copies this assay can detect is 131 copies/mL. A negative result does not preclude SARS-Cov-2 infection and should not be used as the sole basis for treatment or other patient management decisions. A negative result may occur with  improper specimen collection/handling, submission of specimen other than nasopharyngeal swab, presence of viral mutation(s) within the areas targeted by  this assay, and inadequate number of viral copies (<131 copies/mL). A negative result must be combined with clinical observations, patient history, and epidemiological information. The expected result is Negative. Fact Sheet for Patients:  https://www.moore.com/ Fact Sheet for Healthcare Providers:  https://www.young.biz/ This test is not yet ap proved or cleared by the [place] FDA and  has been authorized for detection and/or diagnosis of SARS-CoV-2 by FDA under an Emergency Use Authorization (EUA). This EUA will remain  in effect (meaning this test can be used) for the duration of the COVID-19 declaration under Section 564(b)(1) of the Act, 21 U.S.C. section 360bbb-3(b)(1), unless the authorization is terminated or revoked sooner.    Influenza A by PCR NEGATIVE NEGATIVE Final   Influenza B by PCR NEGATIVE NEGATIVE Final    Comment: (NOTE) The Xpert Xpress SARS-CoV-2/FLU/RSV assay is intended as an aid in  the diagnosis of influenza from Nasopharyngeal swab specimens and  should not be used as a sole basis for treatment. Nasal washings and  aspirates are unacceptable for Xpert Xpress SARS-CoV-2/FLU/RSV  testing. Fact Sheet for Patients: https://www.moore.com/ Fact Sheet for Healthcare  Providers: https://www.young.biz/ This test is not yet approved or cleared by the [place] FDA and  has been authorized for detection and/or diagnosis of SARS-CoV-2 by  FDA under an Emergency Use Authorization (EUA). This EUA will remain  in effect (meaning this test can be used) for the duration of the  Covid-19 declaration under Section 564(b)(1) of the Act, 21  U.S.C. section 360bbb-3(b)(1), unless the authorization is  terminated or revoked. Performed at [facility], [address] [place], [place] [address]   MRSA PCR Screening     Status: None   Collection Time: 03/31/19 11:53 AM   Specimen: Nasopharyngeal  Result Value Ref Range Status   MRSA by PCR NEGATIVE NEGATIVE Final    Comment:        The GeneXpert MRSA Assay (FDA approved for NASAL specimens only), is one component of a comprehensive MRSA colonization surveillance program. It is not intended to diagnose MRSA infection nor to guide or monitor treatment for MRSA infections. Performed at [facility], [address], [place], [place] [address]      Studies: CT HEAD WO CONTRAST  Result Date: 03/31/2019 CLINICAL DATA:  Altered level of consciousness EXAM: CT HEAD WITHOUT CONTRAST TECHNIQUE: Contiguous axial images were obtained from the base of the skull through the vertex without intravenous contrast. COMPARISON:  November 01, 2014 FINDINGS: Brain: No evidence of acute infarction, hemorrhage, hydrocephalus, extra-axial collection or mass lesion/mass effect. Atrophy and chronic microvascular ischemic changes are noted. Vascular: No hyperdense vessel or unexpected calcification. Skull: Normal. Negative for fracture or focal lesion. Sinuses/Orbits: No acute finding. Other: None. IMPRESSION: No acute intracranial abnormality. Electronically Signed   By: Katherine Mantle M.D.   On: 03/31/2019 02:21   CT Angio Chest PE W and/or Wo Contrast  Result Date: 03/30/2019 CLINICAL DATA:  Shortness of breath.  EXAM: CT ANGIOGRAPHY CHEST WITH CONTRAST TECHNIQUE: Multidetector CT imaging of the chest was performed using the standard protocol during bolus administration of intravenous contrast. Multiplanar CT image reconstructions and MIPs were obtained to evaluate the vascular anatomy. CONTRAST:  OMNIPAQUE IOHEXOL 350 MG/ML SOLN COMPARISON:  Report of January 23, 2005. Images not available for comparison. FINDINGS: Cardiovascular: Satisfactory opacification of the pulmonary arteries to the segmental level. No evidence of pulmonary embolism. Mild cardiomegaly is noted. Atherosclerosis of thoracic aorta is noted without aneurysm or dissection. No pericardial effusion. Mediastinum/Nodes: The  esophagus is unremarkable. No adenopathy is noted. Large peripherally calcified left thyroid mass is noted which causes tracheal deviation to the right. Reportedly this was present on the prior exam of 2006. Lungs/Pleura: No pneumothorax or pleural effusion is noted. Minimal left basilar subsegmental atelectasis is noted. Upper Abdomen: No acute abnormality. Musculoskeletal: No chest wall abnormality. No acute or significant osseous findings. Review of the MIP images confirms the above findings. IMPRESSION: No definite evidence of pulmonary embolus is noted. Continued presence of large left-sided thyroid peripherally calcified mass is noted. This was present on prior exam of 2006 and most likely is benign. Aortic Atherosclerosis (ICD10-I70.0). Electronically Signed   By: Marijo Conception M.D.   On: 03/30/2019 21:44   DG Chest Port 1 View  Result Date: 03/30/2019 CLINICAL DATA:  SOB, PER ER NOTE, Pt c/o increasing sob for the past two weeks, denies any fever, denies any cough, recent exposures, pt uses oxygen prn at home, HISTORY OF HTN EXAM: PORTABLE CHEST 1 VIEW COMPARISON:  11/18/2018 FINDINGS: RIGHT-sided PowerPort tip overlies the superior vena cava. Heart is enlarged and stable in configuration. There is minimal opacity at the  LATERAL LEFT lung base. This appear stable and may represent atelectasis or opacity from cardiomegaly. No definite new consolidations. No pulmonary edema. IMPRESSION: 1. Stable cardiomegaly. 2. Stable opacity at the LEFT lung base. Electronically Signed   By: Nolon Nations M.D.   On: 03/30/2019 15:24   Scheduled Meds:  calcium carbonate  1 tablet Oral BID WC   Chlorhexidine Gluconate Cloth  6 each Topical Daily   cholecalciferol  1,000 Units Oral Daily   enoxaparin (LOVENOX) injection  40 mg Subcutaneous Q24H   loratadine  10 mg Oral Daily   pantoprazole  40 mg Oral Daily   sodium chloride flush  3 mL Intravenous Q12H   vitamin B-12  1,000 mcg Oral Daily   Continuous Infusions:  sodium chloride     ceFEPime (MAXIPIME) IV 2 g (04/01/19 1210)   lactated ringers 10 mL/hr at 04/01/19 1148    Principal Problem:   Sepsis secondary to UTI [facility]) Active Problems:   Hypertension   Ventral hernia   UTI (urinary tract infection)   Depression   Dementia ([place])   History of CVA (cerebrovascular accident)   DNR (do not resuscitate)   Palliative care by specialist   Goals of care, counseling/discussion   Acute heart failure ([place])   Weakness  Time spent:   Irwin Brakeman, MD [facility] 04/01/2019, 12:35 PM    LOS: 2 days  How to contact the [facility] Attending or Consulting provider [place] or covering provider during after hours [place], for this patient?  Check the care team in [facility] and look for a) attending/consulting [facility] provider listed and b) the [facility] team listed Log into [email or web address] and use [place]'s universal password to access. If you do not have the password, please contact the hospital operator. Locate the [facility] provider you are looking for under [facility] and page to a number that you can be directly reached. If you still have difficulty reaching the provider, please page the [facility] (Director on Call) for the Hospitalists listed on amion for assistance.

## 2019-04-01 NOTE — Progress Notes (Signed)
*  PRELIMINARY RESULTS* Echocardiogram 2D Echocardiogram has been performed.  Leavy Cella 04/01/2019, 1:46 PM

## 2019-04-02 LAB — GLUCOSE, CAPILLARY
Glucose-Capillary: 87 mg/dL (ref 70–99)
Glucose-Capillary: 104 mg/dL — ABNORMAL HIGH (ref 70–99)

## 2019-04-02 LAB — COMPREHENSIVE METABOLIC PANEL
ALT: 12 U/L (ref 0–44)
AST: 15 U/L (ref 15–41)
Albumin: 2.5 g/dL — ABNORMAL LOW (ref 3.5–5.0)
Alkaline Phosphatase: 58 U/L (ref 38–126)
Anion gap: 8 (ref 5–15)
BUN: 9 mg/dL (ref 8–23)
CO2: 31 mmol/L (ref 22–32)
Calcium: 8.9 mg/dL (ref 8.9–10.3)
Chloride: 102 mmol/L (ref 98–111)
Creatinine, Ser: 0.6 mg/dL (ref 0.44–1.00)
GFR calc Af Amer: >60 mL/min (ref >60)
GFR calc non Af Amer: >60 mL/min (ref >60)
Glucose, Bld: 103 mg/dL — ABNORMAL HIGH (ref 70–99)
Potassium: 3.3 mmol/L — ABNORMAL LOW (ref 3.5–5.1)
Sodium: 141 mmol/L (ref 135–145)
Total Bilirubin: 0.5 mg/dL (ref 0.3–1.2)
Total Protein: 5.6 g/dL — ABNORMAL LOW (ref 6.5–8.1)

## 2019-04-02 MED ORDER — ALBUTEROL SULFATE (2.5 MG/3ML) 0.083% IN NEBU
2.5000 mg | INHALATION_SOLUTION | Freq: Two times a day (BID) | RESPIRATORY_TRACT | Status: DC
  Administered 2019-04-02 – 2019-04-03 (×2): 2.5 mg via RESPIRATORY_TRACT
  Filled 2019-04-02: qty 3

## 2019-04-02 MED ORDER — POTASSIUM CHLORIDE CRYS ER 20 MEQ PO TBCR
40.0000 meq | EXTENDED_RELEASE_TABLET | Freq: Once | ORAL | Status: AC
  Administered 2019-04-02: 40 meq via ORAL
  Filled 2019-04-02: qty 2

## 2019-04-02 NOTE — Clinical Social Work Note (Signed)
Multiple calls made to son and daughter listed on chart. Daughter's mailbox is full. Attemping to identify which [facility] agency family wants to utilize.   Rye Decoste, Clydene Pugh, LCSW

## 2019-04-02 NOTE — Progress Notes (Signed)
Patient c/o itching to PAC area. MD notified with new order for hydroxyzine 10 mg prn.

## 2019-04-02 NOTE — Progress Notes (Signed)
PROGRESS NOTE [facility]  Megan Fisher  UJW:[id number]  DOB: 04-27-1928  DOA: 03/30/2019 PCP: Mechele Claude, MD  Brief Admission Hx: 85 y.o. female on home hospice with medical history significant of chronic congestive heart failure with preserved ejection fraction, dementia, depression, peripheral vascular disease, history of CVA, osteoarthritis, GERD, hypertension.  History is being obtained from the ER as the patient is having dementia and confusion at baseline.  She presented to the ER with history of increased shortness of breath, requiring oxygen at 2 L.  She was found to have a UTI.   MDM/Assessment & Plan:   1. Sepsis secondary to UTI - Covid 19 testing has been negative.  IV antibiotics for UTI. Follow culture and sensitivities.  Continue supportive care.  2. Hypokalemia - oral replacement ordered.   3. DNR on admission - I have been informed by care management that patient is active with home hospice services but after palliative care discussed with family they do not want to resume hospice service and want to have home health services and have 24/7 caretakers in place for patient.   4. Chronic respiratory failure - she is home oxygen dependent and will remain on supplemental oxygen.  5. HFpEF - resumed home medications she is on lasix .   6. Dementia - no behavior disturbances.  7. Depression - pt had been on paxil which is temporarily held.  8. GERD - continue PPI therapy.  9. acute metabolic encephalopathy- likely multifactorial but as above we are limiting sedating medications. 10. Osteoporosis- continue vit D and calcium.   11.  - BMI 47.32, morbid obesity.   DVT prophylaxis: lovenox  Code Status: DNR on home hospice Family Communication: I have been unable to reach daughter tried several times, she doesn't have voicemail set up Disposition Plan: return home with [facility] services.  Possible home tomorrow.   Consultants:    Procedures:    Antimicrobials:      Subjective: Pt says she wants to go home, she remains weak.     Objective: Vitals:   04/01/19 2206 04/02/19 0635 04/02/19 0939 04/02/19 1500  BP: 126/69 (!) 156/77  131/79  Pulse: 77 74  79  Resp: Temp: 98.1 F (36.7 C) 98.2 F (36.8 C)  98.1 F (36.7 C)  TempSrc: Oral Oral  Oral  SpO2: 91% 99% 99% 97%  Weight:  110.6 kg    Height:        Intake/Output Summary (Last 24 hours) at 04/02/2019 1544 Last data filed at 04/02/2019 0600 Gross per 24 hour  Intake 1930.5 ml  Output 200 ml  Net 1730.5 ml   Filed Weights   03/31/19 0621 04/01/19 0500 04/02/19 0635  Weight: 109.2 kg 109.9 kg 110.6 kg    REVIEW OF SYSTEMS  Unable to obtain, due to confusion/dementia  Exam:  General exam: awake, confused with dementia, no apparent distress. Cooperative.  Respiratory system:  No increased work of breathing. Cardiovascular system: S1 & S2 heard. No JVD, murmurs, gallops, clicks or pedal edema. Gastrointestinal system: Abdomen is nondistended, soft and nontender. Normal bowel sounds heard. Central nervous system: Alert and oriented to person only.  No focal neurological deficits. Extremities: no CCE.  Data Reviewed: Basic Metabolic Panel: Recent Labs  Lab 03/30/19 1536 03/30/19 1537 03/31/19 0524 04/01/19 0755 04/02/19 0508  NA 137  --  140 140 141  K 2.8*  --  2.8* 3.5 3.3*  CL 92*  --  98 100 102  CO2 35*  --  33* 30 31  GLUCOSE 117*  --  99 98 103*  BUN 11  --  CREATININE 0.70  --  0.71 0.64 0.60  CALCIUM 8.5*  --  8.4* 8.7* 8.9  MG  --  1.9 1.7 1.8  --    Liver Function Tests: Recent Labs  Lab 03/30/19 1536 04/01/19 0755 04/02/19 0508  AST 14* 14* 15  ALT ALKPHOS 74 59 58  BILITOT 1.0 0.4 0.5  PROT 6.5 5.7* 5.6*  ALBUMIN 2.9* 2.6* 2.5*   No results for input(s): LIPASE, AMYLASE in the last 168 hours. No results for input(s): AMMONIA in the last 168 hours. CBC: Recent Labs  Lab 03/30/19 1536 03/31/19 0524  04/01/19 0755  WBC 9.9 7.0 5.5  NEUTROABS 7.5  --  3.3  HGB 13.6 12.0 12.1  HCT 42.8 38.2 38.8  MCV 93.2 93.9 94.2  PLT 180 130* 148*   Cardiac Enzymes: No results for input(s): CKTOTAL, CKMB, CKMBINDEX, TROPONINI in the last 168 hours. CBG (last 3)  Recent Labs    04/01/19 0026 04/01/19 0729 04/01/19 1057  GLUCAP 90 90 89   Recent Results (from the past 240 hour(s))  Urine culture     Status: Abnormal   Collection Time: 03/30/19  3:00 PM   Specimen: Urine, Random  Result Value Ref Range Status   Specimen Description   Final    URINE, RANDOM Performed at [facility], [address] [place], [place] [address]    Special Requests   Final    NONE Performed at [facility], [address], [place], [place] [address]    Culture (A)  Final    <10,000 COLONIES/mL INSIGNIFICANT GROWTH Performed at [facility], [address] [place], [place] [address]    Report Status 04/01/2019 FINAL  Final  Culture, blood (routine x 2)     Status: None (Preliminary result)   Collection Time: 03/30/19  3:37 PM   Specimen: Left Antecubital; Blood  Result Value Ref Range Status   Specimen Description LEFT ANTECUBITAL  Final   Special Requests   Final    BOTTLES DRAWN AEROBIC AND ANAEROBIC Blood Culture adequate volume   Culture   Final    NO GROWTH 3 DAYS Performed at [facility], [address], [place], [place] [address]    Report Status PENDING  Incomplete  Culture, blood (routine x 2)     Status: None (Preliminary result)   Collection Time: 03/30/19  3:37 PM   Specimen: BLOOD  Result Value Ref Range Status   Specimen Description BLOOD PORT  Final   Special Requests   Final    BOTTLES DRAWN AEROBIC AND ANAEROBIC Blood Culture adequate volume   Culture   Final    NO GROWTH 3 DAYS Performed at [facility], [address], [place], [place] [address]    Report Status PENDING  Incomplete  Respiratory Panel by RT PCR (Flu A&B, Covid) - Nasopharyngeal Swab     Status: None    Collection Time: 03/30/19  4:19 PM   Specimen: Nasopharyngeal Swab  Result Value Ref Range Status   SARS Coronavirus 2 by RT PCR NEGATIVE NEGATIVE Final    Comment: (NOTE) SARS-CoV-2 target nucleic acids are NOT DETECTED. The SARS-CoV-2 RNA is generally detectable in upper respiratoy specimens during the acute phase of infection. The lowest concentration of SARS-CoV-2 viral copies this assay can detect is 131 copies/mL. A negative  result does not preclude SARS-Cov-2 infection and should not be used as the sole basis for treatment or other patient management decisions. A negative result may occur with  improper specimen collection/handling, submission of specimen other than nasopharyngeal swab, presence of viral mutation(s) within the areas targeted by this assay, and inadequate number of viral copies (<131 copies/mL). A negative result must be combined with clinical observations, patient history, and epidemiological information. The expected result is Negative. Fact Sheet for Patients:  PinkCheek.be Fact Sheet for Healthcare Providers:  GravelBags.it This test is not yet ap proved or cleared by the [place] FDA and  has been authorized for detection and/or diagnosis of SARS-CoV-2 by FDA under an Emergency Use Authorization (EUA). This EUA will remain  in effect (meaning this test can be used) for the duration of the COVID-19 declaration under Section 564(b)(1) of the Act, 21 U.S.C. section 360bbb-3(b)(1), unless the authorization is terminated or revoked sooner.    Influenza A by PCR NEGATIVE NEGATIVE Final   Influenza B by PCR NEGATIVE NEGATIVE Final    Comment: (NOTE) The Xpert Xpress SARS-CoV-2/FLU/RSV assay is intended as an aid in  the diagnosis of influenza from Nasopharyngeal swab specimens and  should not be used as a sole basis for treatment. Nasal washings and  aspirates are unacceptable for Xpert Xpress  SARS-CoV-2/FLU/RSV  testing. Fact Sheet for Patients: PinkCheek.be Fact Sheet for Healthcare Providers: GravelBags.it This test is not yet approved or cleared by the [place] FDA and  has been authorized for detection and/or diagnosis of SARS-CoV-2 by  FDA under an Emergency Use Authorization (EUA). This EUA will remain  in effect (meaning this test can be used) for the duration of the  Covid-19 declaration under Section 564(b)(1) of the Act, 21  U.S.C. section 360bbb-3(b)(1), unless the authorization is  terminated or revoked. Performed at [facility], [address], [place], [place] [address]   MRSA PCR Screening     Status: None   Collection Time: 03/31/19 11:53 AM   Specimen: Nasopharyngeal  Result Value Ref Range Status   MRSA by PCR NEGATIVE NEGATIVE Final    Comment:        The GeneXpert MRSA Assay (FDA approved for NASAL specimens only), is one component of a comprehensive MRSA colonization surveillance program. It is not intended to diagnose MRSA infection nor to guide or monitor treatment for MRSA infections. Performed at [facility], [address], [place], [place] [address]      Studies: ECHOCARDIOGRAM COMPLETE  Result Date: 04/01/2019   ECHOCARDIOGRAM REPORT   Patient Name:   MIHAELA FAJARDO Date of Exam: 04/01/2019 Medical Rec #:  [id number]    Height:       60.0 in Accession #:    [id number]   Weight:       242.3 lb Date of Birth:  [birth year]     BSA:          2.03 m Patient Age:    54 years     BP:           136/71 mmHg Patient Gender: F            HR:           82 bpm. Exam Location:  [facility] Procedure: 2D Echo Indications:    Dyspnea 786.09 / R06.00  History:        Patient has no prior history of Echocardiogram examinations.  Stroke; Risk Factors:Hypertension and Non-Smoker. Acute Heart                 Failure, DNR.  Sonographer:    Jeryl ColumbiaJohanna Elliott RDCS (AE) Referring Phys: [id number]  Safiatou Islam L Howard Patton IMPRESSIONS  1. Left ventricular ejection fraction, by visual estimation, is 60 to 65%. The left ventricle has normal function. There is mildly increased left ventricular hypertrophy.  2. Elevated left ventricular end-diastolic pressure.  3. Global right ventricle has normal systolic function.The right ventricular size is mildly enlarged. Right vetricular wall thickness was not assessed.  4. Left atrial size was severely dilated.  5. Right atrial size was mildly dilated.  6. The pericardium was not well visualized.  7. Moderate mitral annular calcification.  8. The mitral valve is degenerative. No evidence of mitral valve regurgitation.  9. The tricuspid valve is grossly normal. Tricuspid valve regurgitation is mild. 10. The aortic valve is tricuspid. Aortic valve regurgitation is not visualized. Moderate to severe aortic valve stenosis. 11. The pulmonic valve was grossly normal. Pulmonic valve regurgitation is trivial. 12. The aortic root was not well visualized. 13. Moderately elevated pulmonary artery systolic pressure. 14. The inferior vena cava is normal in size with greater than 50% respiratory variability, suggesting right atrial pressure of 3 mmHg. 15. The interatrial septum was not well visualized. FINDINGS  Left Ventricle: Left ventricular ejection fraction, by visual estimation, is 60 to 65%. The left ventricle has normal function. The left ventricle is not well visualized. There is mildly increased left ventricular hypertrophy. Concentric left ventricular hypertrophy. Elevated left ventricular end-diastolic pressure. Right Ventricle: The right ventricular size is mildly enlarged. Right vetricular wall thickness was not assessed. Global RV systolic function is has normal systolic function. The tricuspid regurgitant velocity is 2.84 m/s, and with an assumed right atrial pressure of 10 mmHg, the estimated right ventricular systolic pressure is moderately elevated at 42.3 mmHg. Left  Atrium: Left atrial size was severely dilated. Right Atrium: Right atrial size was mildly dilated Pericardium: The pericardium was not well visualized. Mitral Valve: The mitral valve is degenerative in appearance. There is mild thickening of the mitral valve leaflet(s). Moderate mitral annular calcification. No evidence of mitral valve regurgitation. Tricuspid Valve: The tricuspid valve is grossly normal. Tricuspid valve regurgitation is mild. Aortic Valve: The aortic valve is tricuspid. Aortic valve regurgitation is not visualized. Moderate to severe aortic stenosis is present. Moderate aortic valve annular calcification. There is mild calcification of the aortic valve. Aortic valve mean gradient measures 38.3 mmHg. Aortic valve peak gradient measures 58.2 mmHg. Aortic valve area, by VTI measures 0.96 cm. Pulmonic Valve: The pulmonic valve was grossly normal. Pulmonic valve regurgitation is trivial. Pulmonic regurgitation is trivial. Aorta: The aortic root was not well visualized. Venous: The inferior vena cava is normal in size with greater than 50% respiratory variability, suggesting right atrial pressure of 3 mmHg. IAS/Shunts: The interatrial septum was not well visualized.  LEFT VENTRICLE PLAX 2D LVOT diam:     2.10 cm  Diastology LVOT Area:     3.46 cm LV e' lateral:   5.77 cm/s                         LV E/e' lateral: 17.2                         LV e' medial:    5.55 cm/s  LV E/e' medial:  17.9  RIGHT VENTRICLE RV S prime:     19.60 cm/s TAPSE (M-mode): 1.7 cm LEFT ATRIUM           Index       RIGHT ATRIUM           Index LA Vol (A2C): 79.4 ml 39.21 ml/m RA Area:     20.90 cm LA Vol (A4C): 93.2 ml 46.02 ml/m RA Volume:   60.60 ml  29.92 ml/m  AORTIC VALVE AV Area (Vmax):    0.89 cm AV Area (Vmean):   0.90 cm AV Area (VTI):     0.96 cm AV Vmax:           381.33 cm/s AV Vmean:          290.000 cm/s AV VTI:            0.857 m AV Peak Grad:      58.2 mmHg AV Mean Grad:      38.3  mmHg LVOT Vmax:         98.40 cm/s LVOT Vmean:        75.500 cm/s LVOT VTI:          0.238 m LVOT/AV VTI ratio: 0.28 MITRAL VALVE                        TRICUSPID VALVE MV Area (PHT): 2.32 cm             TR Peak grad:   32.3 mmHg MV PHT:        94.83 msec           TR Vmax:        284.00 cm/s MV Decel Time: 327 msec MV E velocity: 99.40 cm/s 103 cm/s  SHUNTS MV A velocity: 76.60 cm/s 70.3 cm/s Systemic VTI:  0.24 m MV E/A ratio:  1.30       1.5       Systemic Diam: 2.10 cm  Prentice Docker MD Electronically signed by Prentice Docker MD Signature Date/Time: 04/01/2019/1:51:41 PM    Final    Scheduled Meds: . albuterol  2.5 mg Nebulization BID  . calcium carbonate  1 tablet Oral BID WC  . Chlorhexidine Gluconate Cloth  6 each Topical Daily  . cholecalciferol  1,000 Units Oral Daily  . enoxaparin (LOVENOX) injection  40 mg Subcutaneous Q24H  . loratadine  10 mg Oral Daily  . pantoprazole  40 mg Oral Daily  . sodium chloride flush  3 mL Intravenous Q12H  . vitamin B-12  1,000 mcg Oral Daily   Continuous Infusions: . sodium chloride    . cefTRIAXone (ROCEPHIN)  IV 1 g (04/01/19 2125)  . lactated ringers 10 mL/hr at 04/01/19 1148    Principal Problem:   Sepsis secondary to UTI [facility]) Active Problems:   Hypertension   Ventral hernia   UTI (urinary tract infection)   Depression   Dementia (HCC)   History of CVA (cerebrovascular accident)   DNR (do not resuscitate)   Palliative care by specialist   Goals of care, counseling/discussion   Acute heart failure (HCC)   Weakness  Time spent:   Standley Dakins, MD [facility] 04/02/2019, 3:44 PM    LOS: 3 days  How to contact the [facility] Attending or Consulting provider 7A - 7P or covering provider during after hours 7P -7A, for this patient?  1. Check the care team in [facility] and look for a) attending/consulting  TRH provider listed and b) the [facility] team listed 2. Log into [email or web address] and use [place]'s universal password to access.  If you do not have the password, please contact the hospital operator. 3. Locate the [facility] provider you are looking for under [facility] and page to a number that you can be directly reached. 4. If you still have difficulty reaching the provider, please page the [facility] (Director on Call) for the Hospitalists listed on amion for assistance.

## 2019-04-02 NOTE — Plan of Care (Signed)
  Problem: Acute Rehab PT Goals(only PT should resolve) Goal: Pt Will Go Supine/Side To Sit Outcome: Progressing Flowsheets (Taken 04/02/2019 0930) Pt will go Supine/Side to Sit:  with moderate assist  with maximum assist Goal: Pt Will Go Sit To Supine/Side Outcome: Progressing Flowsheets (Taken 04/02/2019 0930) Pt will go Sit to Supine/Side:  with moderate assist  with maximum assist Goal: Patient Will Perform Sitting Balance Outcome: Progressing Flowsheets (Taken 04/02/2019 0930) Patient will perform sitting balance:  with min guard assist  with minimal assist   9:31 AM, 04/02/19 Lonell Grandchild, MPT Physical Therapist with [facility] [phone] office [phone] mobile phone

## 2019-04-02 NOTE — Evaluation (Signed)
Physical Therapy Evaluation Patient Details Name: Megan Fisher MRN: [id number] DOB: 1928/05/14 Today's Date: 04/02/2019   History of Present Illness  Megan Fisher is a 85 y.o. female with medical history significant of chronic congestive heart failure with preserved ejection fraction, dementia, depression, peripheral vascular disease, history of CVA, osteoarthritis, GERD, hypertension.  History is being obtained from the ER as the patient is having dementia and confusion at baseline.  She presented to the ER with history of increased shortness of breath, requiring oxygen at 2 L.  She is also been more lethargic, confused than usual.  Has a history of recurrent UTIs.  No fever reported at home, but however noted to be febrile on arrival to the ER.  No nausea, vomiting and abdominal pain was reported at home.Does have chronic shortness of breath on exertion.  No significant productive cough reported.    Clinical Impression  Patient demonstrates slow labored movement for sitting up at bedside with limited use of LUE due to history of CVA.  Patient can use RUE to help pull self to sitting and able to move RLE with active assistance, tolerated sitting up at bedside with frequent leaning to the left, able to keep trunk in midline when support self with RUE, unable to attempt sit to stands or transfers due to BLE weakness.  Patient put back to bed with 2 person Max/total assistance to reposition with bed in head down position.  Patient will benefit from continued physical therapy in hospital and recommended venue below to increase strength, balance, endurance for safe ADLs and gait.    Follow Up Recommendations Home health PT;Supervision for mobility/OOB;Supervision - Intermittent    Equipment Recommendations  None recommended by PT    Recommendations for Other Services       Precautions / Restrictions Precautions Precautions: Fall Restrictions Weight Bearing Restrictions: No      Mobility  Bed  Mobility Overal bed mobility: Needs Assistance Bed Mobility: Supine to Sit;Sit to Supine     Supine to sit: Max assist Sit to supine: Max assist   General bed mobility comments: slow labored movement, able to use RUE to help pull self to sititng, unable to move LLE  Transfers                    Ambulation/Gait                Stairs            Wheelchair Mobility    Modified Rankin (Stroke Patients Only)       Balance Overall balance assessment: Needs assistance Sitting-balance support: Feet supported;Single extremity supported Sitting balance-Leahy Scale: Poor Sitting balance - Comments: fair/poor with frequent leaning to the left Postural control: Left lateral lean                                   Pertinent Vitals/Pain Pain Assessment: Faces Faces Pain Scale: Hurts a little bit Pain Location: BLE with movement Pain Descriptors / Indicators: Sore Pain Intervention(s): Limited activity within patient's tolerance;Monitored during session    Home Living Family/patient expects to be discharged to:: Private residence Living Arrangements: Children Available Help at Discharge: Family;Available 24 hours/day Type of Home: House         Home Equipment: Wheelchair - manual;Hospital bed Additional Comments: uses lift for transfers at home    Prior Function Level of Independence: Needs assistance  Gait / Transfers Assistance Needed: non ambulatory, uses lift for transfers, mostly bed bound  ADL's / Homemaking Assistance Needed: assisted by family        Hand Dominance        Extremity/Trunk Assessment   Upper Extremity Assessment Upper Extremity Assessment: Defer to OT evaluation    Lower Extremity Assessment Lower Extremity Assessment: Generalized weakness;RLE deficits/detail;LLE deficits/detail RLE Deficits / Details: grossly -3/5 LLE Deficits / Details: grossly -2/5    Cervical / Trunk Assessment Cervical / Trunk  Assessment: Kyphotic  Communication   Communication: No difficulties  Cognition Arousal/Alertness: Awake/alert Behavior During Therapy: WFL for tasks assessed/performed Overall Cognitive Status: Within Functional Limits for tasks assessed                                        General Comments      Exercises     Assessment/Plan    PT Assessment Patient needs continued PT services  PT Problem List Decreased strength;Decreased activity tolerance;Decreased range of motion;Decreased balance;Decreased mobility       PT Treatment Interventions Balance training;Functional mobility training;Therapeutic activities;Therapeutic exercise;Patient/family education;Wheelchair mobility training    PT Goals (Current goals can be found in the Care Plan section)  Acute Rehab PT Goals Patient Stated Goal: return home with family to assist PT Goal Formulation: With patient Time For Goal Achievement: 04/09/19 Potential to Achieve Goals: Good    Frequency Min 2X/week   Barriers to discharge        Co-evaluation PT/OT/SLP Co-Evaluation/Treatment: Yes Reason for Co-Treatment: Complexity of the patient's impairments (multi-system involvement);For patient/therapist safety PT goals addressed during session: Mobility/safety with mobility;Balance         AM-PAC PT "6 Clicks" Mobility  Outcome Measure Help needed turning from your back to your side while in a flat bed without using bedrails?: A Lot Help needed moving from lying on your back to sitting on the side of a flat bed without using bedrails?: A Lot Help needed moving to and from a bed to a chair (including a wheelchair)?: Total Help needed standing up from a chair using your arms (e.g., wheelchair or bedside chair)?: Total Help needed to walk in hospital room?: Total Help needed climbing 3-5 steps with a railing? : Total 6 Click Score: 8    End of Session   Activity Tolerance: Patient tolerated treatment  well;Patient limited by fatigue Patient left: in bed;with call bell/phone within reach Nurse Communication: Mobility status PT Visit Diagnosis: Hemiplegia and hemiparesis;Muscle weakness (generalized) (M62.81);Unsteadiness on feet (R26.81) Hemiplegia - Right/Left: Left Hemiplegia - dominant/non-dominant: Non-dominant Hemiplegia - caused by: Cerebral infarction    Time: 1308-6578 PT Time Calculation (min) (ACUTE ONLY): 20 min   Charges:   PT Evaluation $PT Eval Moderate Complexity: 1 Mod PT Treatments $Therapeutic Activity: 8-22 mins        9:28 AM, 04/02/19 Lonell Grandchild, MPT Physical Therapist with [facility] [phone] office [phone] mobile phone

## 2019-04-02 NOTE — Evaluation (Signed)
Occupational Therapy Evaluation Patient Details Name: Megan Fisher MRN: [id number] DOB: February 24, 1929 Today's Date: 04/02/2019    History of Present Illness Megan Fisher is a 85 y.o. female with medical history significant of chronic congestive heart failure with preserved ejection fraction, dementia, depression, peripheral vascular disease, history of CVA, osteoarthritis, GERD, hypertension.  History is being obtained from the ER as the patient is having dementia and confusion at baseline.  She presented to the ER with history of increased shortness of breath, requiring oxygen at 2 L.  She is also been more lethargic, confused than usual.  Has a history of recurrent UTIs.  No fever reported at home, but however noted to be febrile on arrival to the ER.  No nausea, vomiting and abdominal pain was reported at home.Does have chronic shortness of breath on exertion.  No significant productive cough reported.   Clinical Impression   Pt marginally agreeable to OT evaluation this am, PT joining shortly for co-evaluation. Pt tangential regarding having no one to help her and she is angry, consistent redirection for participation. Pt is bedbound at baseline, uses a lift for transfers to wheelchair. Pt has 24/7 caregivers who assist with all ADLs. No further OT services recommended as pt is at her baseline. Recommend discharge with 24/7 assistance.     Follow Up Recommendations  No OT follow up;Supervision/Assistance - 24 hour    Equipment Recommendations  None recommended by OT       Precautions / Restrictions Precautions Precautions: Fall Restrictions Weight Bearing Restrictions: No      Mobility Bed Mobility Overal bed mobility: Needs Assistance Bed Mobility: Supine to Sit;Sit to Supine     Supine to sit: Max assist Sit to supine: Max assist   General bed mobility comments: slow labored movement, able to use RUE to help pull self to sititng, unable to move LLE  Transfers                  General transfer comment: unable to complete        ADL either performed or assessed with clinical judgement   ADL Overall ADL's : Needs assistance/impaired Eating/Feeding: Maximal assistance;Bed level   Grooming: Wash/dry face;Set up;Moderate assistance;Bed level                                 General ADL Comments: pt requiring max to total assist with all ADLs-baseline functioning                  Pertinent Vitals/Pain Pain Assessment: Faces Faces Pain Scale: Hurts a little bit Pain Location: BLE with movement Pain Descriptors / Indicators: Sore Pain Intervention(s): Limited activity within patient's tolerance;Monitored during session;Repositioned     Hand Dominance Right   Extremity/Trunk Assessment Upper Extremity Assessment Upper Extremity Assessment: RUE deficits/detail;LUE deficits/detail RUE Deficits / Details: shoulder ROM limited to <50%, middle trigger finger, strength 3-/5 throughout RUE Coordination: decreased fine motor;decreased gross motor LUE Deficits / Details: shoulder ROM <25%, strength 2-/5, unable to use hand due to contractures LUE Coordination: decreased fine motor;decreased gross motor   Lower Extremity Assessment Lower Extremity Assessment: Defer to PT evaluation RLE Deficits / Details: grossly -3/5 LLE Deficits / Details: grossly -2/5   Cervical / Trunk Assessment Cervical / Trunk Assessment: Kyphotic   Communication Communication Communication: No difficulties   Cognition Arousal/Alertness: Awake/alert Behavior During Therapy: WFL for tasks assessed/performed Overall Cognitive Status: Within Functional Limits for  tasks assessed                                                Home Living Family/patient expects to be discharged to:: Private residence Living Arrangements: Children Available Help at Discharge: Family;Personal care attendant;Available 24 hours/day Type of Home: House                        Home Equipment: Wheelchair - manual;Hospital bed   Additional Comments: uses lift for transfers at home      Prior Functioning/Environment Level of Independence: Needs assistance  Gait / Transfers Assistance Needed: non ambulatory, uses lift for transfers, mostly bed bound ADL's / Homemaking Assistance Needed: assisted by family and aides 24/7, max to total care            OT Problem List: Decreased strength;Decreased activity tolerance;Impaired balance (sitting and/or standing);Decreased coordination;Decreased cognition;Decreased safety awareness;Decreased knowledge of use of DME or AE;Impaired UE functional use      OT Treatment/Interventions:      OT Goals(Current goals can be found in the care plan section) Acute Rehab OT Goals Patient Stated Goal: return home with family to assist  OT Frequency:             Co-evaluation PT/OT/SLP Co-Evaluation/Treatment: Yes Reason for Co-Treatment: Complexity of the patient's impairments (multi-system involvement);For patient/therapist safety;To address functional/ADL transfers PT goals addressed during session: Mobility/safety with mobility;Balance OT goals addressed during session: ADL's and self-care         End of Session    Activity Tolerance: Patient limited by fatigue Patient left: in bed;with call bell/phone within reach;with bed alarm set  OT Visit Diagnosis: Muscle weakness (generalized) (M62.81)                Time: 1025-8527 OT Time Calculation (min): 38 min Charges:  OT General Charges $OT Visit: 1 Visit OT Evaluation $OT Eval Moderate Complexity: [address], OTR/L  [phone] 04/02/2019, 10:05 AM

## 2019-04-03 DIAGNOSIS — I10 Essential (primary) hypertension: Secondary | ICD-10-CM

## 2019-04-03 LAB — COMPREHENSIVE METABOLIC PANEL WITH GFR
ALT: 14 U/L (ref 0–44)
AST: 22 U/L (ref 15–41)
Albumin: 2.7 g/dL — ABNORMAL LOW (ref 3.5–5.0)
Alkaline Phosphatase: 59 U/L (ref 38–126)
Anion gap: 11 (ref 5–15)
BUN: 6 mg/dL — ABNORMAL LOW (ref 8–23)
CO2: 27 mmol/L (ref 22–32)
Calcium: 9.1 mg/dL (ref 8.9–10.3)
Chloride: 102 mmol/L (ref 98–111)
Creatinine, Ser: 0.62 mg/dL (ref 0.44–1.00)
GFR calc Af Amer: >60 mL/min
GFR calc non Af Amer: >60 mL/min
Glucose, Bld: 103 mg/dL — ABNORMAL HIGH (ref 70–99)
Potassium: 3.6 mmol/L (ref 3.5–5.1)
Sodium: 140 mmol/L (ref 135–145)
Total Bilirubin: 0.4 mg/dL (ref 0.3–1.2)
Total Protein: 5.9 g/dL — ABNORMAL LOW (ref 6.5–8.1)

## 2019-04-03 LAB — GLUCOSE, CAPILLARY
Glucose-Capillary: 96 mg/dL (ref 70–99)
Glucose-Capillary: 113 mg/dL — ABNORMAL HIGH (ref 70–99)

## 2019-04-03 MED ORDER — FUROSEMIDE 20 MG PO TABS: 20.0000 mg | ORAL_TABLET | ORAL | 0 refills | Status: DC

## 2019-04-03 NOTE — Discharge Summary (Signed)
Physician Discharge Summary  BRIYAH WHEELWRIGHT GNF:[id number] DOB: Jun 01, 1928 DOA: 03/30/2019  PCP: Mechele Claude, MD  Admit date: 03/30/2019 Discharge date: 04/03/2019  Admitted From:  Home  Disposition: Home with Community home hospice services  Recommendations  1. Please call Community hospice services first before admitting to hospital  Home Health:  [facility]  Discharge Condition: Home Hospice   CODE STATUS: DNR    Brief Hospitalization Summary: Please see all hospital notes, images, labs for full details of the hospitalization. Brief Admission Hx: 85 y.o.female on home hospice with medical history significant ofchronic congestive heart failure with preserved ejection fraction, dementia, depression, peripheral vascular disease, history of CVA, osteoarthritis, GERD, hypertension. History is being obtained from the ER as the patient is having dementia and confusion at baseline. She presented to the ER with history of increased shortness of breath, requiring oxygen at 2 L. She was found to have a UTI.   MDM/Assessment & Plan:   1. Sepsis secondary to UTI - Covid 19 testing has been negative.  IV antibiotics for UTI completed.  UTI fully treated. 2. Hypokalemia - this was repleted orally.   3. DNR on admission - I have been informed by care management that patient is active with home hospice services and son Avon Gully would like resumption of home hospice services at discharge.   4. Chronic respiratory failure - she is home oxygen dependent and will remain on supplemental oxygen.  5. HFpEF - resumed home medications she is on lasix .   6. Dementia - no behavior disturbances.  7. Depression - pt had been on paxil which is resumed.   8. GERD - treated with PPI therapy for GI protection.  9. acute metabolic encephalopathy- RESOLVEd likely multifactorial but as above we are limiting sedating medications. 10. Osteoporosis- continue vit D and calcium.   11. - BMI 47.32,  morbid obesity.   DVT prophylaxis: lovenox  Code Status: DNR on home hospice Family Communication: I have been unable to reach daughter tried several times, son called and notified us that he was POA and he wanted her to return with home hospice services.  TOC confirmed with him and made arrangements.  Disposition Plan: return home with home hospice services.    Consultants:  palliative care   Procedures:    Antimicrobials:    Discharge Diagnoses:  Principal Problem:   Sepsis secondary to UTI [facility]) Active Problems:   Hypertension   Ventral hernia   UTI (urinary tract infection)   Depression   Dementia (HCC)   History of CVA (cerebrovascular accident)   DNR (do not resuscitate)   Palliative care by specialist   Goals of care, counseling/discussion   Acute heart failure (HCC)   Weakness   Discharge Instructions: Discharge Instructions    Ambulatory referral to Hospice   Complete by: As directed      Allergies as of 04/03/2019      Reactions   Propoxyphene Anaphylaxis   Ciprofloxacin Diarrhea   Citalopram Other (See Comments)   Reaction is unknown   Codeine Other (See Comments)   Altered mental status "Makes Crazy"   Levaquin [levofloxacin In D5w] Diarrhea   Oxycontin [oxycodone Hcl] Other (See Comments)   Altered mental status   Ultram [tramadol]    AMS   Zoloft [sertraline Hcl] Other (See Comments)   Adhesive [tape] Itching, Swelling, Rash   Paper tape is ok   Aspirin Rash   Penicillins Swelling, Rash      Medication List  STOP taking these medications   QUEtiapine 50 MG tablet Commonly known as: SEROQUEL     TAKE these medications   acetaminophen 325 MG tablet Commonly known as: TYLENOL Take 650 mg by mouth every 6 (six) hours as needed for mild pain or moderate pain.   benazepril 10 MG tablet Commonly known as: LOTENSIN Take 0.5 tablets (5 mg total) by mouth 2 (two) times daily.   calcium carbonate 1250 (500 Ca) MG tablet Commonly  known as: OS-CAL - dosed in mg of elemental calcium Take 1 tablet by mouth 2 (two) times daily with a meal.   cholecalciferol 1000 units tablet Commonly known as: VITAMIN D Take 1,000 Units by mouth daily.   Cranberry 450 MG Caps Take 1 capsule by mouth daily.   diclofenac sodium 1 % Gel Commonly known as: VOLTAREN Apply 2 g topically 4 (four) times daily as needed (for pain).   furosemide 20 MG tablet Commonly known as: LASIX Take 1 tablet (20 mg total) by mouth every other day. (Needs to be seen before next refill) Start taking on: April 05, 2019 What changed: when to take this   loratadine 10 MG tablet Commonly known as: CLARITIN Take 10 mg by mouth daily.   LORazepam 0.5 MG tablet Commonly known as: ATIVAN Take 1 tablet (0.5 mg total) by mouth at bedtime. What changed:   when to take this  reasons to take this   meclizine 12.5 MG tablet Commonly known as: ANTIVERT TAKE 1 TABLET TWICE A DAY AS NEEDED FOR DIZZINESS What changed: See the new instructions.   omeprazole 20 MG capsule Commonly known as: PRILOSEC Take 1 capsule (20 mg total) by mouth daily.   PARoxetine 20 MG tablet Commonly known as: PAXIL TAKE 1 TABLET IN MORNING What changed:   how much to take  how to take this  when to take this  additional instructions   promethazine 25 MG tablet Commonly known as: PHENERGAN Take 25 mg by mouth every 6 (six) hours as needed for nausea or vomiting.   vitamin B-12 1000 MCG tablet Commonly known as: CYANOCOBALAMIN Take 1,000 mcg by mouth daily.      Follow-up Information    Stacks, Cletus Gash, MD. Schedule an appointment as soon as possible for a visit in 1 week(s).   Specialty: Family Medicine Why: Hospital Follow Up  Contact information: [place] [address] [phone]          Allergies  Allergen Reactions  . Propoxyphene Anaphylaxis  . Ciprofloxacin Diarrhea  . Citalopram Other (See Comments)    Reaction is unknown   . Codeine Other (See Comments)    Altered mental status "Makes Crazy"  . Levaquin [Levofloxacin In D5w] Diarrhea  . Oxycontin [Oxycodone Hcl] Other (See Comments)    Altered mental status  . Ultram [Tramadol]     AMS  . Zoloft [Sertraline Hcl] Other (See Comments)  . Adhesive [Tape] Itching, Swelling and Rash    Paper tape is ok  . Aspirin Rash  . Penicillins Swelling and Rash   Allergies as of 04/03/2019      Reactions   Propoxyphene Anaphylaxis   Ciprofloxacin Diarrhea   Citalopram Other (See Comments)   Reaction is unknown   Codeine Other (See Comments)   Altered mental status "Makes Crazy"   Levaquin [levofloxacin In D5w] Diarrhea   Oxycontin [oxycodone Hcl] Other (See Comments)   Altered mental status   Ultram [tramadol]    AMS   Zoloft [employer]  Hcl] Other (See Comments)   Adhesive [tape] Itching, Swelling, Rash   Paper tape is ok   Aspirin Rash   Penicillins Swelling, Rash      Medication List    STOP taking these medications   QUEtiapine 50 MG tablet Commonly known as: SEROQUEL     TAKE these medications   acetaminophen 325 MG tablet Commonly known as: TYLENOL Take 650 mg by mouth every 6 (six) hours as needed for mild pain or moderate pain.   benazepril 10 MG tablet Commonly known as: LOTENSIN Take 0.5 tablets (5 mg total) by mouth 2 (two) times daily.   calcium carbonate 1250 (500 Ca) MG tablet Commonly known as: OS-CAL - dosed in mg of elemental calcium Take 1 tablet by mouth 2 (two) times daily with a meal.   cholecalciferol 1000 units tablet Commonly known as: VITAMIN D Take 1,000 Units by mouth daily.   Cranberry 450 MG Caps Take 1 capsule by mouth daily.   diclofenac sodium 1 % Gel Commonly known as: VOLTAREN Apply 2 g topically 4 (four) times daily as needed (for pain).   furosemide 20 MG tablet Commonly known as: LASIX Take 1 tablet (20 mg total) by mouth every other day. (Needs to be seen before next refill) Start taking on:  April 05, 2019 What changed: when to take this   loratadine 10 MG tablet Commonly known as: CLARITIN Take 10 mg by mouth daily.   LORazepam 0.5 MG tablet Commonly known as: ATIVAN Take 1 tablet (0.5 mg total) by mouth at bedtime. What changed:   when to take this  reasons to take this   meclizine 12.5 MG tablet Commonly known as: ANTIVERT TAKE 1 TABLET TWICE A DAY AS NEEDED FOR DIZZINESS What changed: See the new instructions.   omeprazole 20 MG capsule Commonly known as: PRILOSEC Take 1 capsule (20 mg total) by mouth daily.   PARoxetine 20 MG tablet Commonly known as: PAXIL TAKE 1 TABLET IN MORNING What changed:   how much to take  how to take this  when to take this  additional instructions   promethazine 25 MG tablet Commonly known as: PHENERGAN Take 25 mg by mouth every 6 (six) hours as needed for nausea or vomiting.   vitamin B-12 1000 MCG tablet Commonly known as: CYANOCOBALAMIN Take 1,000 mcg by mouth daily.       Procedures/Studies: CT HEAD WO CONTRAST  Result Date: 03/31/2019 CLINICAL DATA:  Altered level of consciousness EXAM: CT HEAD WITHOUT CONTRAST TECHNIQUE: Contiguous axial images were obtained from the base of the skull through the vertex without intravenous contrast. COMPARISON:  November 01, 2014 FINDINGS: Brain: No evidence of acute infarction, hemorrhage, hydrocephalus, extra-axial collection or mass lesion/mass effect. Atrophy and chronic microvascular ischemic changes are noted. Vascular: No hyperdense vessel or unexpected calcification. Skull: Normal. Negative for fracture or focal lesion. Sinuses/Orbits: No acute finding. Other: None. IMPRESSION: No acute intracranial abnormality. Electronically Signed   By: Katherine Mantle M.D.   On: 03/31/2019 02:21   CT Angio Chest PE W and/or Wo Contrast  Result Date: 03/30/2019 CLINICAL DATA:  Shortness of breath. EXAM: CT ANGIOGRAPHY CHEST WITH CONTRAST TECHNIQUE: Multidetector CT imaging of the  chest was performed using the standard protocol during bolus administration of intravenous contrast. Multiplanar CT image reconstructions and MIPs were obtained to evaluate the vascular anatomy. CONTRAST:  OMNIPAQUE IOHEXOL 350 MG/ML SOLN COMPARISON:  Report of January 23, 2005. Images not available for comparison. FINDINGS: Cardiovascular: Satisfactory opacification of  the pulmonary arteries to the segmental level. No evidence of pulmonary embolism. Mild cardiomegaly is noted. Atherosclerosis of thoracic aorta is noted without aneurysm or dissection. No pericardial effusion. Mediastinum/Nodes: The esophagus is unremarkable. No adenopathy is noted. Large peripherally calcified left thyroid mass is noted which causes tracheal deviation to the right. Reportedly this was present on the prior exam of 2006. Lungs/Pleura: No pneumothorax or pleural effusion is noted. Minimal left basilar subsegmental atelectasis is noted. Upper Abdomen: No acute abnormality. Musculoskeletal: No chest wall abnormality. No acute or significant osseous findings. Review of the MIP images confirms the above findings. IMPRESSION: No definite evidence of pulmonary embolus is noted. Continued presence of large left-sided thyroid peripherally calcified mass is noted. This was present on prior exam of 2006 and most likely is benign. Aortic Atherosclerosis (ICD10-I70.0). Electronically Signed   By: Lupita Raider M.D.   On: 03/30/2019 21:44   DG Chest Port 1 View  Result Date: 03/30/2019 CLINICAL DATA:  SOB, PER ER NOTE, Pt c/o increasing sob for the past two weeks, denies any fever, denies any cough, recent exposures, pt uses oxygen prn at home, HISTORY OF HTN EXAM: PORTABLE CHEST 1 VIEW COMPARISON:  11/18/2018 FINDINGS: RIGHT-sided PowerPort tip overlies the superior vena cava. Heart is enlarged and stable in configuration. There is minimal opacity at the LATERAL LEFT lung base. This appear stable and may represent atelectasis or opacity  from cardiomegaly. No definite new consolidations. No pulmonary edema. IMPRESSION: 1. Stable cardiomegaly. 2. Stable opacity at the LEFT lung base. Electronically Signed   By: Norva Pavlov M.D.   On: 03/30/2019 15:24   ECHOCARDIOGRAM COMPLETE  Result Date: 04/01/2019   ECHOCARDIOGRAM REPORT   Patient Name:   HALEEMA VANDERHEYDEN Date of Exam: 04/01/2019 Medical Rec #:  [id number]    Height:       60.0 in Accession #:    [id number]   Weight:       242.3 lb Date of Birth:  09-23-1928     BSA:          2.03 m Patient Age:    85 years     BP:           136/71 mmHg Patient Gender: F            HR:           82 bpm. [facility] Procedure: 2D Echo Indications:    Dyspnea 786.09 / R06.00  History:        Patient has no prior history of Echocardiogram examinations.                 Stroke; Risk Factors:Hypertension and Non-Smoker. Acute Heart                 Failure, DNR.  Sonographer:    Jeryl Columbia RDCS (AE) Referring Phys: [id number] Miette Molenda L Kao Berkheimer IMPRESSIONS  1. Left ventricular ejection fraction, by visual estimation, is 60 to 65%. The left ventricle has normal function. There is mildly increased left ventricular hypertrophy.  2. Elevated left ventricular end-diastolic pressure.  3. Global right ventricle has normal systolic function.The right ventricular size is mildly enlarged. Right vetricular wall thickness was not assessed.  4. Left atrial size was severely dilated.  5. Right atrial size was mildly dilated.  6. The pericardium was not well visualized.  7. Moderate mitral annular calcification.  8. The mitral valve is degenerative. No evidence of mitral valve regurgitation.  9. The tricuspid  valve is grossly normal. Tricuspid valve regurgitation is mild. 10. The aortic valve is tricuspid. Aortic valve regurgitation is not visualized. Moderate to severe aortic valve stenosis. 11. The pulmonic valve was grossly normal. Pulmonic valve regurgitation is trivial. 12. The aortic root was not well  visualized. 13. Moderately elevated pulmonary artery systolic pressure. 14. The inferior vena cava is normal in size with greater than 50% respiratory variability, suggesting right atrial pressure of 3 mmHg. 15. The interatrial septum was not well visualized. FINDINGS  Left Ventricle: Left ventricular ejection fraction, by visual estimation, is 60 to 65%. The left ventricle has normal function. The left ventricle is not well visualized. There is mildly increased left ventricular hypertrophy. Concentric left ventricular hypertrophy. Elevated left ventricular end-diastolic pressure. Right Ventricle: The right ventricular size is mildly enlarged. Right vetricular wall thickness was not assessed. Global RV systolic function is has normal systolic function. The tricuspid regurgitant velocity is 2.84 m/s, and with an assumed right atrial pressure of 10 mmHg, the estimated right ventricular systolic pressure is moderately elevated at 42.3 mmHg. Left Atrium: Left atrial size was severely dilated. Right Atrium: Right atrial size was mildly dilated Pericardium: The pericardium was not well visualized. Mitral Valve: The mitral valve is degenerative in appearance. There is mild thickening of the mitral valve leaflet(s). Moderate mitral annular calcification. No evidence of mitral valve regurgitation. Tricuspid Valve: The tricuspid valve is grossly normal. Tricuspid valve regurgitation is mild. Aortic Valve: The aortic valve is tricuspid. Aortic valve regurgitation is not visualized. Moderate to severe aortic stenosis is present. Moderate aortic valve annular calcification. There is mild calcification of the aortic valve. Aortic valve mean gradient measures 38.3 mmHg. Aortic valve peak gradient measures 58.2 mmHg. Aortic valve area, by VTI measures 0.96 cm. Pulmonic Valve: The pulmonic valve was grossly normal. Pulmonic valve regurgitation is trivial. Pulmonic regurgitation is trivial. Aorta: The aortic root was not well  visualized. Venous: The inferior vena cava is normal in size with greater than 50% respiratory variability, suggesting right atrial pressure of 3 mmHg. IAS/Shunts: The interatrial septum was not well visualized.  LEFT VENTRICLE PLAX 2D LVOT diam:     2.10 cm  Diastology LVOT Area:     3.46 cm LV e' lateral:   5.77 cm/s                         LV E/e' lateral: 17.2                         LV e' medial:    5.55 cm/s                         LV E/e' medial:  17.9  RIGHT VENTRICLE RV S prime:     19.60 cm/s TAPSE (M-mode): 1.7 cm LEFT ATRIUM           Index       RIGHT ATRIUM           Index LA Vol (A2C): 79.4 ml 39.21 ml/m RA Area:     20.90 cm LA Vol (A4C): 93.2 ml 46.02 ml/m RA Volume:   60.60 ml  29.92 ml/m  AORTIC VALVE AV Area (Vmax):    0.89 cm AV Area (Vmean):   0.90 cm AV Area (VTI):     0.96 cm AV Vmax:           381.33 cm/s AV Vmean:  290.000 cm/s AV VTI:            0.857 m AV Peak Grad:      58.2 mmHg AV Mean Grad:      38.3 mmHg LVOT Vmax:         98.40 cm/s LVOT Vmean:        75.500 cm/s LVOT VTI:          0.238 m LVOT/AV VTI ratio: 0.28 MITRAL VALVE                        TRICUSPID VALVE MV Area (PHT): 2.32 cm             TR Peak grad:   32.3 mmHg MV PHT:        94.83 msec           TR Vmax:        284.00 cm/s MV Decel Time: 327 msec MV E velocity: 99.40 cm/s 103 cm/s  SHUNTS MV A velocity: 76.60 cm/s 70.3 cm/s Systemic VTI:  0.24 m MV E/A ratio:  1.30       1.5       Systemic Diam: 2.10 cm  Prentice Docker MD Electronically signed by Prentice Docker MD Signature Date/Time: 04/01/2019/1:51:41 PM    Final       Subjective: Pt without complaints, resting comfortably, in no distress.   Discharge Exam: Vitals:   04/03/19 0641 04/03/19 0746  BP: (!) 142/73   Pulse: 75   Resp:    Temp: 98.8 F (37.1 C)   SpO2: 99% 97%   Vitals:   04/02/19 2009 04/02/19 2300 04/03/19 0641 04/03/19 0746  BP:  135/88 (!) 142/73   Pulse:  83 75   Resp:  20    Temp:  99.1 F (37.3 C) 98.8  F (37.1 C)   TempSrc:  Oral Oral   SpO2: 94% 92% 99% 97%  Weight:   110.2 kg   Height:        General exam: awake, confused with dementia, no apparent distress. Cooperative.  Respiratory system:  No increased work of breathing. Cardiovascular system: S1 & S2 heard. No JVD, murmurs, gallops, clicks or pedal edema. Gastrointestinal system: Abdomen is nondistended, soft and nontender. Normal bowel sounds heard. Central nervous system: Alert and oriented to person only.  No focal neurological deficits. Extremities: no CCE.   The results of significant diagnostics from this hospitalization (including imaging, microbiology, ancillary and laboratory) are listed below for reference.     Microbiology: Recent Results (from the past 240 hour(s))  Urine culture     Status: Abnormal   Collection Time: 03/30/19  3:00 PM   Specimen: Urine, Random  Result Value Ref Range Status   Specimen Description   Final    URINE, RANDOM Performed at [facility], [address], [place], [place] [address]    Special Requests   Final    NONE Performed at [facility], [address], [place], [place] [address]    Culture (A)  Final    <10,000 COLONIES/mL INSIGNIFICANT GROWTH Performed at [facility], [address], [place], [place] [address]    Report Status 04/01/2019 FINAL  Final  Culture, blood (routine x 2)     Status: None (Preliminary result)   Collection Time: 03/30/19  3:37 PM   Specimen: Left Antecubital; Blood  Result Value Ref Range Status   Specimen Description LEFT ANTECUBITAL  Final   Special Requests  Final    BOTTLES DRAWN AEROBIC AND ANAEROBIC Blood Culture adequate volume   Culture   Final    NO GROWTH 4 DAYS Performed at [facility], [address], [place], [place] [address]    Report Status PENDING  Incomplete  Culture, blood (routine x 2)     Status: None (Preliminary result)   Collection Time: 03/30/19  3:37 PM   Specimen: BLOOD  Result Value Ref Range Status    Specimen Description BLOOD PORT  Final   Special Requests   Final    BOTTLES DRAWN AEROBIC AND ANAEROBIC Blood Culture adequate volume   Culture   Final    NO GROWTH 4 DAYS Performed at [facility], [address], [place], [place] [address]    Report Status PENDING  Incomplete  Respiratory Panel by RT PCR (Flu A&B, Covid) - Nasopharyngeal Swab     Status: None   Collection Time: 03/30/19  4:19 PM   Specimen: Nasopharyngeal Swab  Result Value Ref Range Status   SARS Coronavirus 2 by RT PCR NEGATIVE NEGATIVE Final    Comment: (NOTE) SARS-CoV-2 target nucleic acids are NOT DETECTED. The SARS-CoV-2 RNA is generally detectable in upper respiratoy specimens during the acute phase of infection. The lowest concentration of SARS-CoV-2 viral copies this assay can detect is 131 copies/mL. A negative result does not preclude SARS-Cov-2 infection and should not be used as the sole basis for treatment or other patient management decisions. A negative result may occur with  improper specimen collection/handling, submission of specimen other than nasopharyngeal swab, presence of viral mutation(s) within the areas targeted by this assay, and inadequate number of viral copies (<131 copies/mL). A negative result must be combined with clinical observations, patient history, and epidemiological information. The expected result is Negative. Fact Sheet for Patients:  https://www.moore.com/ Fact Sheet for Healthcare Providers:  https://www.young.biz/ This test is not yet ap proved or cleared by the [place] FDA and  has been authorized for detection and/or diagnosis of SARS-CoV-2 by FDA under an Emergency Use Authorization (EUA). This EUA will remain  in effect (meaning this test can be used) for the duration of the COVID-19 declaration under Section 564(b)(1) of the Act, 21 U.S.C. section 360bbb-3(b)(1), unless the authorization is terminated or revoked  sooner.    Influenza A by PCR NEGATIVE NEGATIVE Final   Influenza B by PCR NEGATIVE NEGATIVE Final    Comment: (NOTE) The Xpert Xpress SARS-CoV-2/FLU/RSV assay is intended as an aid in  the diagnosis of influenza from Nasopharyngeal swab specimens and  should not be used as a sole basis for treatment. Nasal washings and  aspirates are unacceptable for Xpert Xpress SARS-CoV-2/FLU/RSV  testing. Fact Sheet for Patients: https://www.moore.com/ Fact Sheet for Healthcare Providers: https://www.young.biz/ This test is not yet approved or cleared by the [place] FDA and  has been authorized for detection and/or diagnosis of SARS-CoV-2 by  FDA under an Emergency Use Authorization (EUA). This EUA will remain  in effect (meaning this test can be used) for the duration of the  Covid-19 declaration under Section 564(b)(1) of the Act, 21  U.S.C. section 360bbb-3(b)(1), unless the authorization is  terminated or revoked. Performed at [facility], [address] [place], [place] [address]   MRSA PCR Screening     Status: None   Collection Time: 03/31/19 11:53 AM   Specimen: Nasopharyngeal  Result Value Ref Range Status   MRSA by PCR NEGATIVE NEGATIVE Final    Comment:  The GeneXpert MRSA Assay (FDA approved for NASAL specimens only), is one component of a comprehensive MRSA colonization surveillance program. It is not intended to diagnose MRSA infection nor to guide or monitor treatment for MRSA infections. Performed at [facility], [address] [place], [place] [address]      Labs: BNP (last 3 results) Recent Labs    09/21/18 1405 03/30/19 1537  BNP 151.0* 506.0*   Basic Metabolic Panel: Recent Labs  Lab 03/30/19 1536 03/30/19 1537 03/31/19 0524 04/01/19 0755 04/02/19 0508 04/03/19 0528  NA 137  --  140 140 141 140  K 2.8*  --  2.8* 3.5 3.3* 3.6  CL 92*  --  98 100 102 102  CO2 35*  --  33* GLUCOSE 117*  --   99 98 103* 103*  BUN 11  --  6*  CREATININE 0.70  --  0.71 0.64 0.60 0.62  CALCIUM 8.5*  --  8.4* 8.7* 8.9 9.1  MG  --  1.9 1.7 1.8  --   --    Liver Function Tests: Recent Labs  Lab 03/30/19 1536 04/01/19 0755 04/02/19 0508 04/03/19 0528  AST 14* 14* 15 22  ALT ALKPHOS 74 59 58 59  BILITOT 1.0 0.4 0.5 0.4  PROT 6.5 5.7* 5.6* 5.9*  ALBUMIN 2.9* 2.6* 2.5* 2.7*   No results for input(s): LIPASE, AMYLASE in the last 168 hours. No results for input(s): AMMONIA in the last 168 hours. CBC: Recent Labs  Lab 03/30/19 1536 03/31/19 0524 04/01/19 0755  WBC 9.9 7.0 5.5  NEUTROABS 7.5  --  3.3  HGB 13.6 12.0 12.1  HCT 42.8 38.2 38.8  MCV 93.2 93.9 94.2  PLT 180 130* 148*   Cardiac Enzymes: No results for input(s): CKTOTAL, CKMB, CKMBINDEX, TROPONINI in the last 168 hours. BNP: Invalid input(s): POCBNP CBG: Recent Labs  Lab 04/01/19 0729 04/01/19 1057 04/02/19 1741 04/02/19 2231 04/03/19 0732  GLUCAP 90 89 87 104* 96   D-Dimer No results for input(s): DDIMER in the last 72 hours. Hgb A1c No results for input(s): HGBA1C in the last 72 hours. Lipid Profile No results for input(s): CHOL, HDL, LDLCALC, TRIG, CHOLHDL, LDLDIRECT in the last 72 hours. Thyroid function studies No results for input(s): TSH, T4TOTAL, T3FREE, THYROIDAB in the last 72 hours.  Invalid input(s): FREET3 Anemia work up No results for input(s): VITAMINB12, FOLATE, FERRITIN, TIBC, IRON, RETICCTPCT in the last 72 hours. Urinalysis    Component Value Date/Time   COLORURINE YELLOW 03/30/2019 1500   APPEARANCEUR HAZY (A) 03/30/2019 1500   LABSPEC 1.014 03/30/2019 1500   PHURINE 7.0 03/30/2019 1500   GLUCOSEU NEGATIVE 03/30/2019 1500   HGBUR MODERATE (A) 03/30/2019 1500   BILIRUBINUR NEGATIVE 03/30/2019 1500   BILIRUBINUR neg 10/31/2014 1645   KETONESUR NEGATIVE 03/30/2019 1500   PROTEINUR NEGATIVE 03/30/2019 1500   UROBILINOGEN negative 10/31/2014 1645   UROBILINOGEN 0.2  10/06/2014 1920   NITRITE POSITIVE (A) 03/30/2019 1500   LEUKOCYTESUR LARGE (A) 03/30/2019 1500   Sepsis Labs Invalid input(s): PROCALCITONIN,  WBC,  LACTICIDVEN Microbiology Recent Results (from the past 240 hour(s))  Urine culture     Status: Abnormal   Collection Time: 03/30/19  3:00 PM   Specimen: Urine, Random  Result Value Ref Range Status   Specimen Description   Final    URINE, RANDOM Performed at [facility], [address], [place], [place] [address]    Special Requests  Final    NONE Performed at [facility], [address] [place], [place] [address]    Culture (A)  Final    <10,000 COLONIES/mL INSIGNIFICANT GROWTH Performed at [facility], [address] [place], [place] [address]    Report Status 04/01/2019 FINAL  Final  Culture, blood (routine x 2)     Status: None (Preliminary result)   Collection Time: 03/30/19  3:37 PM   Specimen: Left Antecubital; Blood  Result Value Ref Range Status   Specimen Description LEFT ANTECUBITAL  Final   Special Requests   Final    BOTTLES DRAWN AEROBIC AND ANAEROBIC Blood Culture adequate volume   Culture   Final    NO GROWTH 4 DAYS Performed at [facility], [address], [place], [place] [address]    Report Status PENDING  Incomplete  Culture, blood (routine x 2)     Status: None (Preliminary result)   Collection Time: 03/30/19  3:37 PM   Specimen: BLOOD  Result Value Ref Range Status   Specimen Description BLOOD PORT  Final   Special Requests   Final    BOTTLES DRAWN AEROBIC AND ANAEROBIC Blood Culture adequate volume   Culture   Final    NO GROWTH 4 DAYS Performed at [facility], [address] [place], [place] [address]    Report Status PENDING  Incomplete  Respiratory Panel by RT PCR (Flu A&B, Covid) - Nasopharyngeal Swab     Status: None   Collection Time: 03/30/19  4:19 PM   Specimen: Nasopharyngeal Swab  Result Value Ref Range Status   SARS Coronavirus 2 by RT PCR NEGATIVE NEGATIVE Final     Comment: (NOTE) SARS-CoV-2 target nucleic acids are NOT DETECTED. The SARS-CoV-2 RNA is generally detectable in upper respiratoy specimens during the acute phase of infection. The lowest concentration of SARS-CoV-2 viral copies this assay can detect is 131 copies/mL. A negative result does not preclude SARS-Cov-2 infection and should not be used as the sole basis for treatment or other patient management decisions. A negative result may occur with  improper specimen collection/handling, submission of specimen other than nasopharyngeal swab, presence of viral mutation(s) within the areas targeted by this assay, and inadequate number of viral copies (<131 copies/mL). A negative result must be combined with clinical observations, patient history, and epidemiological information. The expected result is Negative. Fact Sheet for Patients:  https://www.moore.com/ Fact Sheet for Healthcare Providers:  https://www.young.biz/ This test is not yet ap proved or cleared by the [place] FDA and  has been authorized for detection and/or diagnosis of SARS-CoV-2 by FDA under an Emergency Use Authorization (EUA). This EUA will remain  in effect (meaning this test can be used) for the duration of the COVID-19 declaration under Section 564(b)(1) of the Act, 21 U.S.C. section 360bbb-3(b)(1), unless the authorization is terminated or revoked sooner.    Influenza A by PCR NEGATIVE NEGATIVE Final   Influenza B by PCR NEGATIVE NEGATIVE Final    Comment: (NOTE) The Xpert Xpress SARS-CoV-2/FLU/RSV assay is intended as an aid in  the diagnosis of influenza from Nasopharyngeal swab specimens and  should not be used as a sole basis for treatment. Nasal washings and  aspirates are unacceptable for Xpert Xpress SARS-CoV-2/FLU/RSV  testing. Fact Sheet for Patients: https://www.moore.com/ Fact Sheet for Healthcare  Providers: https://www.young.biz/ This test is not yet approved or cleared by the [place] FDA and  has been authorized for detection and/or diagnosis of SARS-CoV-2 by  FDA under an  Emergency Use Authorization (EUA). This EUA will remain  in effect (meaning this test can be used) for the duration of the  Covid-19 declaration under Section 564(b)(1) of the Act, 21  U.S.C. section 360bbb-3(b)(1), unless the authorization is  terminated or revoked. Performed at [facility], [address], [place], [place] [address]   MRSA PCR Screening     Status: None   Collection Time: 03/31/19 11:53 AM   Specimen: Nasopharyngeal  Result Value Ref Range Status   MRSA by PCR NEGATIVE NEGATIVE Final    Comment:        The GeneXpert MRSA Assay (FDA approved for NASAL specimens only), is one component of a comprehensive MRSA colonization surveillance program. It is not intended to diagnose MRSA infection nor to guide or monitor treatment for MRSA infections. Performed at [facility], [address], [place], [place] [address]     Time coordinating discharge: 36 minutes   SIGNED:  Standley Dakins, MD  [facility] 04/03/2019, 11:03 AM How to contact the [facility] Attending or Consulting provider 7A - 7P or covering provider during after hours 7P -7A, for this patient?  1. Check the care team in [facility] and look for a) attending/consulting TRH provider listed and b) the [facility] team listed 2. Log into [email or web address] and use [facility]'s universal password to access. If you do not have the password, please contact the hospital operator. 3. Locate the [facility] provider you are looking for under [facility] and page to a number that you can be directly reached. 4. If you still have difficulty reaching the provider, please page the [facility] (Director on Call) for the Hospitalists listed on amion for assistance.

## 2019-04-03 NOTE — Progress Notes (Signed)
Nsg Discharge Note  Admit Date:  03/30/2019 Discharge date: 04/03/2019   Megan Fisher to be D/C'd home per MD order.  AVS completed.  Copy for chart, and copy for patient signed, and dated. Patient/caregiver able to verbalize understanding.  Discharge Medication: Allergies as of 04/03/2019      Reactions   Propoxyphene Anaphylaxis   Ciprofloxacin Diarrhea   Citalopram Other (See Comments)   Reaction is unknown   Codeine Other (See Comments)   Altered mental status "Makes Crazy"   Levaquin [levofloxacin In D5w] Diarrhea   Oxycontin [oxycodone Hcl] Other (See Comments)   Altered mental status   Ultram [tramadol]    AMS   Zoloft [sertraline Hcl] Other (See Comments)   Adhesive [tape] Itching, Swelling, Rash   Paper tape is ok   Aspirin Rash   Penicillins Swelling, Rash      Medication List    STOP taking these medications   QUEtiapine 50 MG tablet Commonly known as: SEROQUEL     TAKE these medications   acetaminophen 325 MG tablet Commonly known as: TYLENOL Take 650 mg by mouth every 6 (six) hours as needed for mild pain or moderate pain.   benazepril 10 MG tablet Commonly known as: LOTENSIN Take 0.5 tablets (5 mg total) by mouth 2 (two) times daily.   calcium carbonate 1250 (500 Ca) MG tablet Commonly known as: OS-CAL - dosed in mg of elemental calcium Take 1 tablet by mouth 2 (two) times daily with a meal.   cholecalciferol 1000 units tablet Commonly known as: VITAMIN D Take 1,000 Units by mouth daily.   Cranberry 450 MG Caps Take 1 capsule by mouth daily.   diclofenac sodium 1 % Gel Commonly known as: VOLTAREN Apply 2 g topically 4 (four) times daily as needed (for pain).   furosemide 20 MG tablet Commonly known as: LASIX Take 1 tablet (20 mg total) by mouth every other day. (Needs to be seen before next refill) Start taking on: April 05, 2019 What changed: when to take this   loratadine 10 MG tablet Commonly known as: CLARITIN Take 10 mg by mouth  daily.   LORazepam 0.5 MG tablet Commonly known as: ATIVAN Take 1 tablet (0.5 mg total) by mouth at bedtime. What changed:   when to take this  reasons to take this   meclizine 12.5 MG tablet Commonly known as: ANTIVERT TAKE 1 TABLET TWICE A DAY AS NEEDED FOR DIZZINESS What changed: See the new instructions.   omeprazole 20 MG capsule Commonly known as: PRILOSEC Take 1 capsule (20 mg total) by mouth daily.   PARoxetine 20 MG tablet Commonly known as: PAXIL TAKE 1 TABLET IN MORNING What changed:   how much to take  how to take this  when to take this  additional instructions   promethazine 25 MG tablet Commonly known as: PHENERGAN Take 25 mg by mouth every 6 (six) hours as needed for nausea or vomiting.   vitamin B-12 1000 MCG tablet Commonly known as: CYANOCOBALAMIN Take 1,000 mcg by mouth daily.       Discharge Assessment: Vitals:   04/03/19 0641 04/03/19 0746  BP: (!) 142/73   Pulse: 75   Resp:    Temp: 98.8 F (37.1 C)   SpO2: 99% 97%   Skin clean, dry and intact without evidence of skin break down, no evidence of skin tears noted. IV catheter discontinued intact. Site without signs and symptoms of complications - no redness or edema noted at insertion site,  patient denies c/o pain - only slight tenderness at site.  Dressing with slight pressure applied.  D/c Instructions-Education: Discharge instructions given to patient/family with verbalized understanding. D/c education completed with patient/family including follow up instructions, medication list, d/c activities limitations if indicated, with other d/c instructions as indicated by MD - patient able to verbalize understanding, all questions fully answered. Patient instructed to return to ED, call 911, or call MD for any changes in condition.  Patient escorted via EMS home. Carney Corners, RN 04/03/2019 12:21 PM

## 2019-04-03 NOTE — Progress Notes (Signed)
Spoke with hospice nurse, pt is ok to go home on their end. Son made aware. EMS to be called for transport. Pt port already de-accessed.

## 2019-04-03 NOTE — Discharge Instructions (Signed)
Urinary Tract Infection, Adult A urinary tract infection (UTI) is an infection of any part of the urinary tract. The urinary tract includes:  The kidneys.  The ureters.  The bladder.  The urethra. These organs make, store, and get rid of pee (urine) in the body. What are the causes? This is caused by germs (bacteria) in your genital area. These germs grow and cause swelling (inflammation) of your urinary tract. What increases the risk? You are more likely to develop this condition if:  You have a small, thin tube (catheter) to drain pee.  You cannot control when you pee or poop (incontinence).  You are female, and: ? You use these methods to prevent pregnancy: ? A medicine that kills sperm (spermicide). ? A device that blocks sperm (diaphragm). ? You have low levels of a female hormone (estrogen). ? You are pregnant.  You have genes that add to your risk.  You are sexually active.  You take antibiotic medicines.  You have trouble peeing because of: ? A prostate that is bigger than normal, if you are female. ? A blockage in the part of your body that drains pee from the bladder (urethra). ? A kidney stone. ? A nerve condition that affects your bladder (neurogenic bladder). ? Not getting enough to drink. ? Not peeing often enough.  You have other conditions, such as: ? Diabetes. ? A weak disease-fighting system (immune system). ? Sickle cell disease. ? Gout. ? Injury of the spine. What are the signs or symptoms? Symptoms of this condition include:  Needing to pee right away (urgently).  Peeing often.  Peeing small amounts often.  Pain or burning when peeing.  Blood in the pee.  Pee that smells bad or not like normal.  Trouble peeing.  Pee that is cloudy.  Fluid coming from the vagina, if you are female.  Pain in the belly or lower back. Other symptoms include:  Throwing up (vomiting).  No urge to eat.  Feeling mixed up (confused).  Being tired  and grouchy (irritable).  A fever.  Watery poop (diarrhea). How is this treated? This condition may be treated with:  Antibiotic medicine.  Other medicines.  Drinking enough water. Follow these instructions at home:  Medicines  Take over-the-counter and prescription medicines only as told by your doctor.  If you were prescribed an antibiotic medicine, take it as told by your doctor. Do not stop taking it even if you start to feel better. General instructions  Make sure you: ? Pee until your bladder is empty. ? Do not hold pee for a long time. ? Empty your bladder after sex. ? Wipe from front to back after pooping if you are a female. Use each tissue one time when you wipe.  Drink enough fluid to keep your pee pale yellow.  Keep all follow-up visits as told by your doctor. This is important. Contact a doctor if:  You do not get better after 1-2 days.  Your symptoms go away and then come back. Get help right away if:  You have very bad back pain.  You have very bad pain in your lower belly.  You have a fever.  You are sick to your stomach (nauseous).  You are throwing up. Summary  A urinary tract infection (UTI) is an infection of any part of the urinary tract.  This condition is caused by germs in your genital area.  There are many risk factors for a UTI. These include having a small, thin   tube to drain pee and not being able to control when you pee or poop.  Treatment includes antibiotic medicines for germs.  Drink enough fluid to keep your pee pale yellow. This information is not intended to replace advice given to you by your health care provider. Make sure you discuss any questions you have with your health care provider. Document Released: 09/25/2007 Document Revised: 03/26/2018 Document Reviewed: 10/16/2017 Elsevier Patient Education  2020 Elsevier Inc.  

## 2019-04-03 NOTE — TOC Transition Note (Signed)
Transition of Care [facility]) - CM/SW Discharge Note   Patient Details  Name: SYRIANNA SCHILLACI MRN: [id number] Date of Birth: [birth year]  [facility] CM/SW Contact:  Marshell Garfinkel, RN Phone Number: 04/03/2019, 11:02 AM   Clinical Narrative:    RNCM spoke with Lenna Sciara with [facility] as they are following patient for home hospice. THis TOC nurse received message from nurse that son Aaron Edelman would like to resume hospice services. Aaron Edelman is HCPOA for patient. Nurse to call Report [phone].    Final next level of care: [place]     Patient Goals and CMS Choice        Discharge Placement                       Discharge Plan and Services                                     Social Determinants of Health (SDOH) Interventions     Readmission Risk Interventions No flowsheet data found.

## 2019-04-03 NOTE — Progress Notes (Signed)
Per case manager on call, called Hospice services to make them aware of pt's discharge. Should receive a call back from Hospice nurse within 20 mins, once discharge discussed with hospice RN, EMS and other arrangements may be made. Will continue to monitor.

## 2019-04-04 LAB — CULTURE, BLOOD (ROUTINE X 2)
Culture: NO GROWTH
Culture: NO GROWTH
Special Requests: ADEQUATE
Special Requests: ADEQUATE

## 2019-04-07 ENCOUNTER — Other Ambulatory Visit: Payer: Self-pay | Admitting: Family Medicine

## 2019-04-20 ENCOUNTER — Other Ambulatory Visit: Payer: Self-pay | Admitting: Family Medicine

## 2019-04-23 DIAGNOSIS — F419 Anxiety disorder, unspecified: Secondary | ICD-10-CM | POA: Diagnosis not present

## 2019-04-23 DIAGNOSIS — I5022 Chronic systolic (congestive) heart failure: Secondary | ICD-10-CM | POA: Diagnosis not present

## 2019-04-23 DIAGNOSIS — Z741 Need for assistance with personal care: Secondary | ICD-10-CM | POA: Diagnosis not present

## 2019-04-23 DIAGNOSIS — E669 Obesity, unspecified: Secondary | ICD-10-CM | POA: Diagnosis not present

## 2019-04-23 DIAGNOSIS — M1990 Unspecified osteoarthritis, unspecified site: Secondary | ICD-10-CM | POA: Diagnosis not present

## 2019-04-23 DIAGNOSIS — Z66 Do not resuscitate: Secondary | ICD-10-CM | POA: Diagnosis not present

## 2019-04-23 DIAGNOSIS — Z7401 Bed confinement status: Secondary | ICD-10-CM | POA: Diagnosis not present

## 2019-04-23 DIAGNOSIS — Z9981 Dependence on supplemental oxygen: Secondary | ICD-10-CM | POA: Diagnosis not present

## 2019-04-23 DIAGNOSIS — Z87440 Personal history of urinary (tract) infections: Secondary | ICD-10-CM | POA: Diagnosis not present

## 2019-04-23 DIAGNOSIS — I6350 Cerebral infarction due to unspecified occlusion or stenosis of unspecified cerebral artery: Secondary | ICD-10-CM | POA: Diagnosis not present

## 2019-04-26 DIAGNOSIS — E669 Obesity, unspecified: Secondary | ICD-10-CM | POA: Diagnosis not present

## 2019-04-26 DIAGNOSIS — F419 Anxiety disorder, unspecified: Secondary | ICD-10-CM | POA: Diagnosis not present

## 2019-04-26 DIAGNOSIS — Z9981 Dependence on supplemental oxygen: Secondary | ICD-10-CM | POA: Diagnosis not present

## 2019-04-26 DIAGNOSIS — I5022 Chronic systolic (congestive) heart failure: Secondary | ICD-10-CM | POA: Diagnosis not present

## 2019-04-26 DIAGNOSIS — I6350 Cerebral infarction due to unspecified occlusion or stenosis of unspecified cerebral artery: Secondary | ICD-10-CM | POA: Diagnosis not present

## 2019-04-26 DIAGNOSIS — Z87440 Personal history of urinary (tract) infections: Secondary | ICD-10-CM | POA: Diagnosis not present

## 2019-04-27 DIAGNOSIS — F419 Anxiety disorder, unspecified: Secondary | ICD-10-CM | POA: Diagnosis not present

## 2019-04-27 DIAGNOSIS — I5022 Chronic systolic (congestive) heart failure: Secondary | ICD-10-CM | POA: Diagnosis not present

## 2019-04-27 DIAGNOSIS — Z9981 Dependence on supplemental oxygen: Secondary | ICD-10-CM | POA: Diagnosis not present

## 2019-04-27 DIAGNOSIS — I6350 Cerebral infarction due to unspecified occlusion or stenosis of unspecified cerebral artery: Secondary | ICD-10-CM | POA: Diagnosis not present

## 2019-04-27 DIAGNOSIS — Z87440 Personal history of urinary (tract) infections: Secondary | ICD-10-CM | POA: Diagnosis not present

## 2019-04-27 DIAGNOSIS — E669 Obesity, unspecified: Secondary | ICD-10-CM | POA: Diagnosis not present

## 2019-04-29 DIAGNOSIS — I6350 Cerebral infarction due to unspecified occlusion or stenosis of unspecified cerebral artery: Secondary | ICD-10-CM | POA: Diagnosis not present

## 2019-04-29 DIAGNOSIS — E669 Obesity, unspecified: Secondary | ICD-10-CM | POA: Diagnosis not present

## 2019-04-29 DIAGNOSIS — I5022 Chronic systolic (congestive) heart failure: Secondary | ICD-10-CM | POA: Diagnosis not present

## 2019-04-29 DIAGNOSIS — F419 Anxiety disorder, unspecified: Secondary | ICD-10-CM | POA: Diagnosis not present

## 2019-04-29 DIAGNOSIS — Z9981 Dependence on supplemental oxygen: Secondary | ICD-10-CM | POA: Diagnosis not present

## 2019-04-29 DIAGNOSIS — Z87440 Personal history of urinary (tract) infections: Secondary | ICD-10-CM | POA: Diagnosis not present

## 2019-05-03 DIAGNOSIS — I5022 Chronic systolic (congestive) heart failure: Secondary | ICD-10-CM | POA: Diagnosis not present

## 2019-05-03 DIAGNOSIS — Z9981 Dependence on supplemental oxygen: Secondary | ICD-10-CM | POA: Diagnosis not present

## 2019-05-03 DIAGNOSIS — I6350 Cerebral infarction due to unspecified occlusion or stenosis of unspecified cerebral artery: Secondary | ICD-10-CM | POA: Diagnosis not present

## 2019-05-03 DIAGNOSIS — F419 Anxiety disorder, unspecified: Secondary | ICD-10-CM | POA: Diagnosis not present

## 2019-05-03 DIAGNOSIS — E669 Obesity, unspecified: Secondary | ICD-10-CM | POA: Diagnosis not present

## 2019-05-03 DIAGNOSIS — Z87440 Personal history of urinary (tract) infections: Secondary | ICD-10-CM | POA: Diagnosis not present

## 2019-05-04 DIAGNOSIS — F419 Anxiety disorder, unspecified: Secondary | ICD-10-CM | POA: Diagnosis not present

## 2019-05-04 DIAGNOSIS — Z9981 Dependence on supplemental oxygen: Secondary | ICD-10-CM | POA: Diagnosis not present

## 2019-05-04 DIAGNOSIS — I5022 Chronic systolic (congestive) heart failure: Secondary | ICD-10-CM | POA: Diagnosis not present

## 2019-05-04 DIAGNOSIS — I6350 Cerebral infarction due to unspecified occlusion or stenosis of unspecified cerebral artery: Secondary | ICD-10-CM | POA: Diagnosis not present

## 2019-05-04 DIAGNOSIS — Z87440 Personal history of urinary (tract) infections: Secondary | ICD-10-CM | POA: Diagnosis not present

## 2019-05-04 DIAGNOSIS — E669 Obesity, unspecified: Secondary | ICD-10-CM | POA: Diagnosis not present

## 2019-05-06 DIAGNOSIS — Z9981 Dependence on supplemental oxygen: Secondary | ICD-10-CM | POA: Diagnosis not present

## 2019-05-06 DIAGNOSIS — I5022 Chronic systolic (congestive) heart failure: Secondary | ICD-10-CM | POA: Diagnosis not present

## 2019-05-06 DIAGNOSIS — F419 Anxiety disorder, unspecified: Secondary | ICD-10-CM | POA: Diagnosis not present

## 2019-05-06 DIAGNOSIS — Z87440 Personal history of urinary (tract) infections: Secondary | ICD-10-CM | POA: Diagnosis not present

## 2019-05-06 DIAGNOSIS — E669 Obesity, unspecified: Secondary | ICD-10-CM | POA: Diagnosis not present

## 2019-05-06 DIAGNOSIS — I6350 Cerebral infarction due to unspecified occlusion or stenosis of unspecified cerebral artery: Secondary | ICD-10-CM | POA: Diagnosis not present

## 2019-05-10 DIAGNOSIS — F419 Anxiety disorder, unspecified: Secondary | ICD-10-CM | POA: Diagnosis not present

## 2019-05-10 DIAGNOSIS — I6350 Cerebral infarction due to unspecified occlusion or stenosis of unspecified cerebral artery: Secondary | ICD-10-CM | POA: Diagnosis not present

## 2019-05-10 DIAGNOSIS — Z87440 Personal history of urinary (tract) infections: Secondary | ICD-10-CM | POA: Diagnosis not present

## 2019-05-10 DIAGNOSIS — I5022 Chronic systolic (congestive) heart failure: Secondary | ICD-10-CM | POA: Diagnosis not present

## 2019-05-10 DIAGNOSIS — Z9981 Dependence on supplemental oxygen: Secondary | ICD-10-CM | POA: Diagnosis not present

## 2019-05-10 DIAGNOSIS — E669 Obesity, unspecified: Secondary | ICD-10-CM | POA: Diagnosis not present

## 2019-05-11 ENCOUNTER — Other Ambulatory Visit: Payer: Self-pay | Admitting: Family Medicine

## 2019-05-11 DIAGNOSIS — E669 Obesity, unspecified: Secondary | ICD-10-CM | POA: Diagnosis not present

## 2019-05-11 DIAGNOSIS — Z87440 Personal history of urinary (tract) infections: Secondary | ICD-10-CM | POA: Diagnosis not present

## 2019-05-11 DIAGNOSIS — I6350 Cerebral infarction due to unspecified occlusion or stenosis of unspecified cerebral artery: Secondary | ICD-10-CM | POA: Diagnosis not present

## 2019-05-11 DIAGNOSIS — F419 Anxiety disorder, unspecified: Secondary | ICD-10-CM | POA: Diagnosis not present

## 2019-05-11 DIAGNOSIS — Z9981 Dependence on supplemental oxygen: Secondary | ICD-10-CM | POA: Diagnosis not present

## 2019-05-11 DIAGNOSIS — I5022 Chronic systolic (congestive) heart failure: Secondary | ICD-10-CM | POA: Diagnosis not present

## 2019-05-12 DIAGNOSIS — Z9981 Dependence on supplemental oxygen: Secondary | ICD-10-CM | POA: Diagnosis not present

## 2019-05-12 DIAGNOSIS — I5022 Chronic systolic (congestive) heart failure: Secondary | ICD-10-CM | POA: Diagnosis not present

## 2019-05-12 DIAGNOSIS — F419 Anxiety disorder, unspecified: Secondary | ICD-10-CM | POA: Diagnosis not present

## 2019-05-12 DIAGNOSIS — E669 Obesity, unspecified: Secondary | ICD-10-CM | POA: Diagnosis not present

## 2019-05-12 DIAGNOSIS — Z87440 Personal history of urinary (tract) infections: Secondary | ICD-10-CM | POA: Diagnosis not present

## 2019-05-12 DIAGNOSIS — I6350 Cerebral infarction due to unspecified occlusion or stenosis of unspecified cerebral artery: Secondary | ICD-10-CM | POA: Diagnosis not present

## 2019-05-18 DIAGNOSIS — F419 Anxiety disorder, unspecified: Secondary | ICD-10-CM | POA: Diagnosis not present

## 2019-05-18 DIAGNOSIS — Z87440 Personal history of urinary (tract) infections: Secondary | ICD-10-CM | POA: Diagnosis not present

## 2019-05-18 DIAGNOSIS — E669 Obesity, unspecified: Secondary | ICD-10-CM | POA: Diagnosis not present

## 2019-05-18 DIAGNOSIS — Z9981 Dependence on supplemental oxygen: Secondary | ICD-10-CM | POA: Diagnosis not present

## 2019-05-18 DIAGNOSIS — I6350 Cerebral infarction due to unspecified occlusion or stenosis of unspecified cerebral artery: Secondary | ICD-10-CM | POA: Diagnosis not present

## 2019-05-18 DIAGNOSIS — I5022 Chronic systolic (congestive) heart failure: Secondary | ICD-10-CM | POA: Diagnosis not present

## 2019-05-20 DIAGNOSIS — E669 Obesity, unspecified: Secondary | ICD-10-CM | POA: Diagnosis not present

## 2019-05-20 DIAGNOSIS — Z9981 Dependence on supplemental oxygen: Secondary | ICD-10-CM | POA: Diagnosis not present

## 2019-05-20 DIAGNOSIS — I5022 Chronic systolic (congestive) heart failure: Secondary | ICD-10-CM | POA: Diagnosis not present

## 2019-05-20 DIAGNOSIS — I6350 Cerebral infarction due to unspecified occlusion or stenosis of unspecified cerebral artery: Secondary | ICD-10-CM | POA: Diagnosis not present

## 2019-05-20 DIAGNOSIS — Z87440 Personal history of urinary (tract) infections: Secondary | ICD-10-CM | POA: Diagnosis not present

## 2019-05-20 DIAGNOSIS — F419 Anxiety disorder, unspecified: Secondary | ICD-10-CM | POA: Diagnosis not present

## 2019-05-24 DIAGNOSIS — I5022 Chronic systolic (congestive) heart failure: Secondary | ICD-10-CM | POA: Diagnosis not present

## 2019-05-24 DIAGNOSIS — Z9981 Dependence on supplemental oxygen: Secondary | ICD-10-CM | POA: Diagnosis not present

## 2019-05-24 DIAGNOSIS — F419 Anxiety disorder, unspecified: Secondary | ICD-10-CM | POA: Diagnosis not present

## 2019-05-24 DIAGNOSIS — I6350 Cerebral infarction due to unspecified occlusion or stenosis of unspecified cerebral artery: Secondary | ICD-10-CM | POA: Diagnosis not present

## 2019-05-24 DIAGNOSIS — Z87440 Personal history of urinary (tract) infections: Secondary | ICD-10-CM | POA: Diagnosis not present

## 2019-05-24 DIAGNOSIS — E669 Obesity, unspecified: Secondary | ICD-10-CM | POA: Diagnosis not present

## 2019-05-24 DIAGNOSIS — Z7401 Bed confinement status: Secondary | ICD-10-CM | POA: Diagnosis not present

## 2019-05-24 DIAGNOSIS — Z741 Need for assistance with personal care: Secondary | ICD-10-CM | POA: Diagnosis not present

## 2019-05-24 DIAGNOSIS — M1990 Unspecified osteoarthritis, unspecified site: Secondary | ICD-10-CM | POA: Diagnosis not present

## 2019-05-24 DIAGNOSIS — Z66 Do not resuscitate: Secondary | ICD-10-CM | POA: Diagnosis not present

## 2019-05-25 DIAGNOSIS — I6350 Cerebral infarction due to unspecified occlusion or stenosis of unspecified cerebral artery: Secondary | ICD-10-CM | POA: Diagnosis not present

## 2019-05-25 DIAGNOSIS — Z9981 Dependence on supplemental oxygen: Secondary | ICD-10-CM | POA: Diagnosis not present

## 2019-05-25 DIAGNOSIS — E669 Obesity, unspecified: Secondary | ICD-10-CM | POA: Diagnosis not present

## 2019-05-25 DIAGNOSIS — I5022 Chronic systolic (congestive) heart failure: Secondary | ICD-10-CM | POA: Diagnosis not present

## 2019-05-25 DIAGNOSIS — Z87440 Personal history of urinary (tract) infections: Secondary | ICD-10-CM | POA: Diagnosis not present

## 2019-05-25 DIAGNOSIS — F419 Anxiety disorder, unspecified: Secondary | ICD-10-CM | POA: Diagnosis not present

## 2019-05-26 DIAGNOSIS — Z87440 Personal history of urinary (tract) infections: Secondary | ICD-10-CM | POA: Diagnosis not present

## 2019-05-26 DIAGNOSIS — E669 Obesity, unspecified: Secondary | ICD-10-CM | POA: Diagnosis not present

## 2019-05-26 DIAGNOSIS — F419 Anxiety disorder, unspecified: Secondary | ICD-10-CM | POA: Diagnosis not present

## 2019-05-26 DIAGNOSIS — I6350 Cerebral infarction due to unspecified occlusion or stenosis of unspecified cerebral artery: Secondary | ICD-10-CM | POA: Diagnosis not present

## 2019-05-26 DIAGNOSIS — Z9981 Dependence on supplemental oxygen: Secondary | ICD-10-CM | POA: Diagnosis not present

## 2019-05-26 DIAGNOSIS — I5022 Chronic systolic (congestive) heart failure: Secondary | ICD-10-CM | POA: Diagnosis not present

## 2019-05-27 DIAGNOSIS — I5022 Chronic systolic (congestive) heart failure: Secondary | ICD-10-CM | POA: Diagnosis not present

## 2019-05-27 DIAGNOSIS — E669 Obesity, unspecified: Secondary | ICD-10-CM | POA: Diagnosis not present

## 2019-05-27 DIAGNOSIS — I6350 Cerebral infarction due to unspecified occlusion or stenosis of unspecified cerebral artery: Secondary | ICD-10-CM | POA: Diagnosis not present

## 2019-05-27 DIAGNOSIS — Z9981 Dependence on supplemental oxygen: Secondary | ICD-10-CM | POA: Diagnosis not present

## 2019-05-27 DIAGNOSIS — Z87440 Personal history of urinary (tract) infections: Secondary | ICD-10-CM | POA: Diagnosis not present

## 2019-05-27 DIAGNOSIS — F419 Anxiety disorder, unspecified: Secondary | ICD-10-CM | POA: Diagnosis not present

## 2019-06-01 ENCOUNTER — Other Ambulatory Visit: Payer: Self-pay | Admitting: Family Medicine

## 2019-06-01 DIAGNOSIS — I5022 Chronic systolic (congestive) heart failure: Secondary | ICD-10-CM | POA: Diagnosis not present

## 2019-06-01 DIAGNOSIS — I6350 Cerebral infarction due to unspecified occlusion or stenosis of unspecified cerebral artery: Secondary | ICD-10-CM | POA: Diagnosis not present

## 2019-06-01 DIAGNOSIS — Z9981 Dependence on supplemental oxygen: Secondary | ICD-10-CM | POA: Diagnosis not present

## 2019-06-01 DIAGNOSIS — Z87440 Personal history of urinary (tract) infections: Secondary | ICD-10-CM | POA: Diagnosis not present

## 2019-06-01 DIAGNOSIS — F419 Anxiety disorder, unspecified: Secondary | ICD-10-CM | POA: Diagnosis not present

## 2019-06-01 DIAGNOSIS — E669 Obesity, unspecified: Secondary | ICD-10-CM | POA: Diagnosis not present

## 2019-06-03 DIAGNOSIS — I5022 Chronic systolic (congestive) heart failure: Secondary | ICD-10-CM | POA: Diagnosis not present

## 2019-06-03 DIAGNOSIS — I6350 Cerebral infarction due to unspecified occlusion or stenosis of unspecified cerebral artery: Secondary | ICD-10-CM | POA: Diagnosis not present

## 2019-06-03 DIAGNOSIS — F419 Anxiety disorder, unspecified: Secondary | ICD-10-CM | POA: Diagnosis not present

## 2019-06-03 DIAGNOSIS — E669 Obesity, unspecified: Secondary | ICD-10-CM | POA: Diagnosis not present

## 2019-06-03 DIAGNOSIS — Z87440 Personal history of urinary (tract) infections: Secondary | ICD-10-CM | POA: Diagnosis not present

## 2019-06-03 DIAGNOSIS — Z9981 Dependence on supplemental oxygen: Secondary | ICD-10-CM | POA: Diagnosis not present

## 2019-06-08 DIAGNOSIS — Z9981 Dependence on supplemental oxygen: Secondary | ICD-10-CM | POA: Diagnosis not present

## 2019-06-08 DIAGNOSIS — E669 Obesity, unspecified: Secondary | ICD-10-CM | POA: Diagnosis not present

## 2019-06-08 DIAGNOSIS — F419 Anxiety disorder, unspecified: Secondary | ICD-10-CM | POA: Diagnosis not present

## 2019-06-08 DIAGNOSIS — I6350 Cerebral infarction due to unspecified occlusion or stenosis of unspecified cerebral artery: Secondary | ICD-10-CM | POA: Diagnosis not present

## 2019-06-08 DIAGNOSIS — Z87440 Personal history of urinary (tract) infections: Secondary | ICD-10-CM | POA: Diagnosis not present

## 2019-06-08 DIAGNOSIS — I5022 Chronic systolic (congestive) heart failure: Secondary | ICD-10-CM | POA: Diagnosis not present

## 2019-06-09 DIAGNOSIS — Z9981 Dependence on supplemental oxygen: Secondary | ICD-10-CM | POA: Diagnosis not present

## 2019-06-09 DIAGNOSIS — I5022 Chronic systolic (congestive) heart failure: Secondary | ICD-10-CM | POA: Diagnosis not present

## 2019-06-09 DIAGNOSIS — I6350 Cerebral infarction due to unspecified occlusion or stenosis of unspecified cerebral artery: Secondary | ICD-10-CM | POA: Diagnosis not present

## 2019-06-09 DIAGNOSIS — F419 Anxiety disorder, unspecified: Secondary | ICD-10-CM | POA: Diagnosis not present

## 2019-06-09 DIAGNOSIS — E669 Obesity, unspecified: Secondary | ICD-10-CM | POA: Diagnosis not present

## 2019-06-09 DIAGNOSIS — Z87440 Personal history of urinary (tract) infections: Secondary | ICD-10-CM | POA: Diagnosis not present

## 2019-06-15 DIAGNOSIS — E669 Obesity, unspecified: Secondary | ICD-10-CM | POA: Diagnosis not present

## 2019-06-15 DIAGNOSIS — Z9981 Dependence on supplemental oxygen: Secondary | ICD-10-CM | POA: Diagnosis not present

## 2019-06-15 DIAGNOSIS — Z87440 Personal history of urinary (tract) infections: Secondary | ICD-10-CM | POA: Diagnosis not present

## 2019-06-15 DIAGNOSIS — I6350 Cerebral infarction due to unspecified occlusion or stenosis of unspecified cerebral artery: Secondary | ICD-10-CM | POA: Diagnosis not present

## 2019-06-15 DIAGNOSIS — I5022 Chronic systolic (congestive) heart failure: Secondary | ICD-10-CM | POA: Diagnosis not present

## 2019-06-15 DIAGNOSIS — F419 Anxiety disorder, unspecified: Secondary | ICD-10-CM | POA: Diagnosis not present

## 2019-06-17 DIAGNOSIS — I5022 Chronic systolic (congestive) heart failure: Secondary | ICD-10-CM | POA: Diagnosis not present

## 2019-06-17 DIAGNOSIS — E669 Obesity, unspecified: Secondary | ICD-10-CM | POA: Diagnosis not present

## 2019-06-17 DIAGNOSIS — I6350 Cerebral infarction due to unspecified occlusion or stenosis of unspecified cerebral artery: Secondary | ICD-10-CM | POA: Diagnosis not present

## 2019-06-17 DIAGNOSIS — F419 Anxiety disorder, unspecified: Secondary | ICD-10-CM | POA: Diagnosis not present

## 2019-06-17 DIAGNOSIS — Z9981 Dependence on supplemental oxygen: Secondary | ICD-10-CM | POA: Diagnosis not present

## 2019-06-17 DIAGNOSIS — Z87440 Personal history of urinary (tract) infections: Secondary | ICD-10-CM | POA: Diagnosis not present

## 2019-06-21 DIAGNOSIS — Z9981 Dependence on supplemental oxygen: Secondary | ICD-10-CM | POA: Diagnosis not present

## 2019-06-21 DIAGNOSIS — Z741 Need for assistance with personal care: Secondary | ICD-10-CM | POA: Diagnosis not present

## 2019-06-21 DIAGNOSIS — I6350 Cerebral infarction due to unspecified occlusion or stenosis of unspecified cerebral artery: Secondary | ICD-10-CM | POA: Diagnosis not present

## 2019-06-21 DIAGNOSIS — I5022 Chronic systolic (congestive) heart failure: Secondary | ICD-10-CM | POA: Diagnosis not present

## 2019-06-21 DIAGNOSIS — M1990 Unspecified osteoarthritis, unspecified site: Secondary | ICD-10-CM | POA: Diagnosis not present

## 2019-06-21 DIAGNOSIS — Z7401 Bed confinement status: Secondary | ICD-10-CM | POA: Diagnosis not present

## 2019-06-21 DIAGNOSIS — Z66 Do not resuscitate: Secondary | ICD-10-CM | POA: Diagnosis not present

## 2019-06-21 DIAGNOSIS — F419 Anxiety disorder, unspecified: Secondary | ICD-10-CM | POA: Diagnosis not present

## 2019-06-21 DIAGNOSIS — E669 Obesity, unspecified: Secondary | ICD-10-CM | POA: Diagnosis not present

## 2019-06-21 DIAGNOSIS — Z87440 Personal history of urinary (tract) infections: Secondary | ICD-10-CM | POA: Diagnosis not present

## 2019-06-22 DIAGNOSIS — F419 Anxiety disorder, unspecified: Secondary | ICD-10-CM | POA: Diagnosis not present

## 2019-06-22 DIAGNOSIS — I6350 Cerebral infarction due to unspecified occlusion or stenosis of unspecified cerebral artery: Secondary | ICD-10-CM | POA: Diagnosis not present

## 2019-06-22 DIAGNOSIS — Z87440 Personal history of urinary (tract) infections: Secondary | ICD-10-CM | POA: Diagnosis not present

## 2019-06-22 DIAGNOSIS — E669 Obesity, unspecified: Secondary | ICD-10-CM | POA: Diagnosis not present

## 2019-06-22 DIAGNOSIS — I5022 Chronic systolic (congestive) heart failure: Secondary | ICD-10-CM | POA: Diagnosis not present

## 2019-06-22 DIAGNOSIS — Z9981 Dependence on supplemental oxygen: Secondary | ICD-10-CM | POA: Diagnosis not present

## 2019-06-24 DIAGNOSIS — F419 Anxiety disorder, unspecified: Secondary | ICD-10-CM | POA: Diagnosis not present

## 2019-06-24 DIAGNOSIS — I6350 Cerebral infarction due to unspecified occlusion or stenosis of unspecified cerebral artery: Secondary | ICD-10-CM | POA: Diagnosis not present

## 2019-06-24 DIAGNOSIS — Z9981 Dependence on supplemental oxygen: Secondary | ICD-10-CM | POA: Diagnosis not present

## 2019-06-24 DIAGNOSIS — E669 Obesity, unspecified: Secondary | ICD-10-CM | POA: Diagnosis not present

## 2019-06-24 DIAGNOSIS — I5022 Chronic systolic (congestive) heart failure: Secondary | ICD-10-CM | POA: Diagnosis not present

## 2019-06-24 DIAGNOSIS — Z87440 Personal history of urinary (tract) infections: Secondary | ICD-10-CM | POA: Diagnosis not present

## 2019-06-26 ENCOUNTER — Other Ambulatory Visit: Payer: Self-pay | Admitting: Family Medicine

## 2019-06-29 DIAGNOSIS — L309 Dermatitis, unspecified: Secondary | ICD-10-CM | POA: Diagnosis not present

## 2019-06-29 DIAGNOSIS — Z9981 Dependence on supplemental oxygen: Secondary | ICD-10-CM | POA: Diagnosis not present

## 2019-06-29 DIAGNOSIS — Z87440 Personal history of urinary (tract) infections: Secondary | ICD-10-CM | POA: Diagnosis not present

## 2019-06-29 DIAGNOSIS — R197 Diarrhea, unspecified: Secondary | ICD-10-CM | POA: Diagnosis not present

## 2019-06-29 DIAGNOSIS — E669 Obesity, unspecified: Secondary | ICD-10-CM | POA: Diagnosis not present

## 2019-06-29 DIAGNOSIS — I6350 Cerebral infarction due to unspecified occlusion or stenosis of unspecified cerebral artery: Secondary | ICD-10-CM | POA: Diagnosis not present

## 2019-06-29 DIAGNOSIS — I5022 Chronic systolic (congestive) heart failure: Secondary | ICD-10-CM | POA: Diagnosis not present

## 2019-06-29 DIAGNOSIS — F419 Anxiety disorder, unspecified: Secondary | ICD-10-CM | POA: Diagnosis not present

## 2019-07-01 DIAGNOSIS — I5022 Chronic systolic (congestive) heart failure: Secondary | ICD-10-CM | POA: Diagnosis not present

## 2019-07-01 DIAGNOSIS — Z87440 Personal history of urinary (tract) infections: Secondary | ICD-10-CM | POA: Diagnosis not present

## 2019-07-01 DIAGNOSIS — F419 Anxiety disorder, unspecified: Secondary | ICD-10-CM | POA: Diagnosis not present

## 2019-07-01 DIAGNOSIS — Z9981 Dependence on supplemental oxygen: Secondary | ICD-10-CM | POA: Diagnosis not present

## 2019-07-01 DIAGNOSIS — I6350 Cerebral infarction due to unspecified occlusion or stenosis of unspecified cerebral artery: Secondary | ICD-10-CM | POA: Diagnosis not present

## 2019-07-01 DIAGNOSIS — E669 Obesity, unspecified: Secondary | ICD-10-CM | POA: Diagnosis not present

## 2019-07-02 DIAGNOSIS — F419 Anxiety disorder, unspecified: Secondary | ICD-10-CM | POA: Diagnosis not present

## 2019-07-02 DIAGNOSIS — Z87440 Personal history of urinary (tract) infections: Secondary | ICD-10-CM | POA: Diagnosis not present

## 2019-07-02 DIAGNOSIS — I6350 Cerebral infarction due to unspecified occlusion or stenosis of unspecified cerebral artery: Secondary | ICD-10-CM | POA: Diagnosis not present

## 2019-07-02 DIAGNOSIS — E669 Obesity, unspecified: Secondary | ICD-10-CM | POA: Diagnosis not present

## 2019-07-02 DIAGNOSIS — Z9981 Dependence on supplemental oxygen: Secondary | ICD-10-CM | POA: Diagnosis not present

## 2019-07-02 DIAGNOSIS — I5022 Chronic systolic (congestive) heart failure: Secondary | ICD-10-CM | POA: Diagnosis not present

## 2019-07-06 DIAGNOSIS — Z87440 Personal history of urinary (tract) infections: Secondary | ICD-10-CM | POA: Diagnosis not present

## 2019-07-06 DIAGNOSIS — E669 Obesity, unspecified: Secondary | ICD-10-CM | POA: Diagnosis not present

## 2019-07-06 DIAGNOSIS — F419 Anxiety disorder, unspecified: Secondary | ICD-10-CM | POA: Diagnosis not present

## 2019-07-06 DIAGNOSIS — I6350 Cerebral infarction due to unspecified occlusion or stenosis of unspecified cerebral artery: Secondary | ICD-10-CM | POA: Diagnosis not present

## 2019-07-06 DIAGNOSIS — Z9981 Dependence on supplemental oxygen: Secondary | ICD-10-CM | POA: Diagnosis not present

## 2019-07-06 DIAGNOSIS — I5022 Chronic systolic (congestive) heart failure: Secondary | ICD-10-CM | POA: Diagnosis not present

## 2019-07-07 DIAGNOSIS — Z87440 Personal history of urinary (tract) infections: Secondary | ICD-10-CM | POA: Diagnosis not present

## 2019-07-07 DIAGNOSIS — I6350 Cerebral infarction due to unspecified occlusion or stenosis of unspecified cerebral artery: Secondary | ICD-10-CM | POA: Diagnosis not present

## 2019-07-07 DIAGNOSIS — E669 Obesity, unspecified: Secondary | ICD-10-CM | POA: Diagnosis not present

## 2019-07-07 DIAGNOSIS — F419 Anxiety disorder, unspecified: Secondary | ICD-10-CM | POA: Diagnosis not present

## 2019-07-07 DIAGNOSIS — Z9981 Dependence on supplemental oxygen: Secondary | ICD-10-CM | POA: Diagnosis not present

## 2019-07-07 DIAGNOSIS — I5022 Chronic systolic (congestive) heart failure: Secondary | ICD-10-CM | POA: Diagnosis not present

## 2019-07-08 DIAGNOSIS — I5022 Chronic systolic (congestive) heart failure: Secondary | ICD-10-CM | POA: Diagnosis not present

## 2019-07-08 DIAGNOSIS — E669 Obesity, unspecified: Secondary | ICD-10-CM | POA: Diagnosis not present

## 2019-07-08 DIAGNOSIS — Z9981 Dependence on supplemental oxygen: Secondary | ICD-10-CM | POA: Diagnosis not present

## 2019-07-08 DIAGNOSIS — I6350 Cerebral infarction due to unspecified occlusion or stenosis of unspecified cerebral artery: Secondary | ICD-10-CM | POA: Diagnosis not present

## 2019-07-08 DIAGNOSIS — Z87440 Personal history of urinary (tract) infections: Secondary | ICD-10-CM | POA: Diagnosis not present

## 2019-07-08 DIAGNOSIS — F419 Anxiety disorder, unspecified: Secondary | ICD-10-CM | POA: Diagnosis not present

## 2019-07-13 DIAGNOSIS — F419 Anxiety disorder, unspecified: Secondary | ICD-10-CM | POA: Diagnosis not present

## 2019-07-13 DIAGNOSIS — Z87440 Personal history of urinary (tract) infections: Secondary | ICD-10-CM | POA: Diagnosis not present

## 2019-07-13 DIAGNOSIS — E669 Obesity, unspecified: Secondary | ICD-10-CM | POA: Diagnosis not present

## 2019-07-13 DIAGNOSIS — I5022 Chronic systolic (congestive) heart failure: Secondary | ICD-10-CM | POA: Diagnosis not present

## 2019-07-13 DIAGNOSIS — I6350 Cerebral infarction due to unspecified occlusion or stenosis of unspecified cerebral artery: Secondary | ICD-10-CM | POA: Diagnosis not present

## 2019-07-13 DIAGNOSIS — Z9981 Dependence on supplemental oxygen: Secondary | ICD-10-CM | POA: Diagnosis not present

## 2019-07-15 DIAGNOSIS — F419 Anxiety disorder, unspecified: Secondary | ICD-10-CM | POA: Diagnosis not present

## 2019-07-15 DIAGNOSIS — I5022 Chronic systolic (congestive) heart failure: Secondary | ICD-10-CM | POA: Diagnosis not present

## 2019-07-15 DIAGNOSIS — Z87440 Personal history of urinary (tract) infections: Secondary | ICD-10-CM | POA: Diagnosis not present

## 2019-07-15 DIAGNOSIS — I6350 Cerebral infarction due to unspecified occlusion or stenosis of unspecified cerebral artery: Secondary | ICD-10-CM | POA: Diagnosis not present

## 2019-07-15 DIAGNOSIS — Z9981 Dependence on supplemental oxygen: Secondary | ICD-10-CM | POA: Diagnosis not present

## 2019-07-15 DIAGNOSIS — E669 Obesity, unspecified: Secondary | ICD-10-CM | POA: Diagnosis not present

## 2019-07-21 DIAGNOSIS — I5022 Chronic systolic (congestive) heart failure: Secondary | ICD-10-CM | POA: Diagnosis not present

## 2019-07-21 DIAGNOSIS — Z87440 Personal history of urinary (tract) infections: Secondary | ICD-10-CM | POA: Diagnosis not present

## 2019-07-21 DIAGNOSIS — Z9981 Dependence on supplemental oxygen: Secondary | ICD-10-CM | POA: Diagnosis not present

## 2019-07-21 DIAGNOSIS — F419 Anxiety disorder, unspecified: Secondary | ICD-10-CM | POA: Diagnosis not present

## 2019-07-21 DIAGNOSIS — I6350 Cerebral infarction due to unspecified occlusion or stenosis of unspecified cerebral artery: Secondary | ICD-10-CM | POA: Diagnosis not present

## 2019-07-21 DIAGNOSIS — E669 Obesity, unspecified: Secondary | ICD-10-CM | POA: Diagnosis not present

## 2019-07-22 DIAGNOSIS — Z87440 Personal history of urinary (tract) infections: Secondary | ICD-10-CM | POA: Diagnosis not present

## 2019-07-22 DIAGNOSIS — E669 Obesity, unspecified: Secondary | ICD-10-CM | POA: Diagnosis not present

## 2019-07-22 DIAGNOSIS — Z9981 Dependence on supplemental oxygen: Secondary | ICD-10-CM | POA: Diagnosis not present

## 2019-07-22 DIAGNOSIS — Z7401 Bed confinement status: Secondary | ICD-10-CM | POA: Diagnosis not present

## 2019-07-22 DIAGNOSIS — Z66 Do not resuscitate: Secondary | ICD-10-CM | POA: Diagnosis not present

## 2019-07-22 DIAGNOSIS — I6350 Cerebral infarction due to unspecified occlusion or stenosis of unspecified cerebral artery: Secondary | ICD-10-CM | POA: Diagnosis not present

## 2019-07-22 DIAGNOSIS — Z741 Need for assistance with personal care: Secondary | ICD-10-CM | POA: Diagnosis not present

## 2019-07-22 DIAGNOSIS — F419 Anxiety disorder, unspecified: Secondary | ICD-10-CM | POA: Diagnosis not present

## 2019-07-22 DIAGNOSIS — I5022 Chronic systolic (congestive) heart failure: Secondary | ICD-10-CM | POA: Diagnosis not present

## 2019-07-22 DIAGNOSIS — M1990 Unspecified osteoarthritis, unspecified site: Secondary | ICD-10-CM | POA: Diagnosis not present

## 2019-07-23 DIAGNOSIS — F419 Anxiety disorder, unspecified: Secondary | ICD-10-CM | POA: Diagnosis not present

## 2019-07-23 DIAGNOSIS — E669 Obesity, unspecified: Secondary | ICD-10-CM | POA: Diagnosis not present

## 2019-07-23 DIAGNOSIS — I6350 Cerebral infarction due to unspecified occlusion or stenosis of unspecified cerebral artery: Secondary | ICD-10-CM | POA: Diagnosis not present

## 2019-07-23 DIAGNOSIS — Z9981 Dependence on supplemental oxygen: Secondary | ICD-10-CM | POA: Diagnosis not present

## 2019-07-23 DIAGNOSIS — I5022 Chronic systolic (congestive) heart failure: Secondary | ICD-10-CM | POA: Diagnosis not present

## 2019-07-23 DIAGNOSIS — Z87440 Personal history of urinary (tract) infections: Secondary | ICD-10-CM | POA: Diagnosis not present

## 2019-07-28 ENCOUNTER — Other Ambulatory Visit: Payer: Self-pay | Admitting: Family Medicine

## 2019-07-30 DIAGNOSIS — I5022 Chronic systolic (congestive) heart failure: Secondary | ICD-10-CM | POA: Diagnosis not present

## 2019-07-30 DIAGNOSIS — I6350 Cerebral infarction due to unspecified occlusion or stenosis of unspecified cerebral artery: Secondary | ICD-10-CM | POA: Diagnosis not present

## 2019-07-30 DIAGNOSIS — E669 Obesity, unspecified: Secondary | ICD-10-CM | POA: Diagnosis not present

## 2019-07-30 DIAGNOSIS — F419 Anxiety disorder, unspecified: Secondary | ICD-10-CM | POA: Diagnosis not present

## 2019-07-30 DIAGNOSIS — Z9981 Dependence on supplemental oxygen: Secondary | ICD-10-CM | POA: Diagnosis not present

## 2019-07-30 DIAGNOSIS — Z87440 Personal history of urinary (tract) infections: Secondary | ICD-10-CM | POA: Diagnosis not present

## 2019-08-03 DIAGNOSIS — E669 Obesity, unspecified: Secondary | ICD-10-CM | POA: Diagnosis not present

## 2019-08-03 DIAGNOSIS — Z87440 Personal history of urinary (tract) infections: Secondary | ICD-10-CM | POA: Diagnosis not present

## 2019-08-03 DIAGNOSIS — F419 Anxiety disorder, unspecified: Secondary | ICD-10-CM | POA: Diagnosis not present

## 2019-08-03 DIAGNOSIS — Z9981 Dependence on supplemental oxygen: Secondary | ICD-10-CM | POA: Diagnosis not present

## 2019-08-03 DIAGNOSIS — I6350 Cerebral infarction due to unspecified occlusion or stenosis of unspecified cerebral artery: Secondary | ICD-10-CM | POA: Diagnosis not present

## 2019-08-03 DIAGNOSIS — I5022 Chronic systolic (congestive) heart failure: Secondary | ICD-10-CM | POA: Diagnosis not present

## 2019-08-05 DIAGNOSIS — I6350 Cerebral infarction due to unspecified occlusion or stenosis of unspecified cerebral artery: Secondary | ICD-10-CM | POA: Diagnosis not present

## 2019-08-05 DIAGNOSIS — I5022 Chronic systolic (congestive) heart failure: Secondary | ICD-10-CM | POA: Diagnosis not present

## 2019-08-05 DIAGNOSIS — E669 Obesity, unspecified: Secondary | ICD-10-CM | POA: Diagnosis not present

## 2019-08-05 DIAGNOSIS — Z9981 Dependence on supplemental oxygen: Secondary | ICD-10-CM | POA: Diagnosis not present

## 2019-08-05 DIAGNOSIS — Z87440 Personal history of urinary (tract) infections: Secondary | ICD-10-CM | POA: Diagnosis not present

## 2019-08-05 DIAGNOSIS — F419 Anxiety disorder, unspecified: Secondary | ICD-10-CM | POA: Diagnosis not present

## 2019-08-06 DIAGNOSIS — Z87440 Personal history of urinary (tract) infections: Secondary | ICD-10-CM | POA: Diagnosis not present

## 2019-08-06 DIAGNOSIS — F419 Anxiety disorder, unspecified: Secondary | ICD-10-CM | POA: Diagnosis not present

## 2019-08-06 DIAGNOSIS — I6350 Cerebral infarction due to unspecified occlusion or stenosis of unspecified cerebral artery: Secondary | ICD-10-CM | POA: Diagnosis not present

## 2019-08-06 DIAGNOSIS — I5022 Chronic systolic (congestive) heart failure: Secondary | ICD-10-CM | POA: Diagnosis not present

## 2019-08-06 DIAGNOSIS — E669 Obesity, unspecified: Secondary | ICD-10-CM | POA: Diagnosis not present

## 2019-08-06 DIAGNOSIS — Z9981 Dependence on supplemental oxygen: Secondary | ICD-10-CM | POA: Diagnosis not present

## 2019-08-07 ENCOUNTER — Other Ambulatory Visit: Payer: Self-pay | Admitting: Family Medicine

## 2019-08-10 DIAGNOSIS — I5022 Chronic systolic (congestive) heart failure: Secondary | ICD-10-CM | POA: Diagnosis not present

## 2019-08-10 DIAGNOSIS — Z9981 Dependence on supplemental oxygen: Secondary | ICD-10-CM | POA: Diagnosis not present

## 2019-08-10 DIAGNOSIS — E669 Obesity, unspecified: Secondary | ICD-10-CM | POA: Diagnosis not present

## 2019-08-10 DIAGNOSIS — F419 Anxiety disorder, unspecified: Secondary | ICD-10-CM | POA: Diagnosis not present

## 2019-08-10 DIAGNOSIS — I6350 Cerebral infarction due to unspecified occlusion or stenosis of unspecified cerebral artery: Secondary | ICD-10-CM | POA: Diagnosis not present

## 2019-08-10 DIAGNOSIS — Z87440 Personal history of urinary (tract) infections: Secondary | ICD-10-CM | POA: Diagnosis not present

## 2019-08-12 DIAGNOSIS — I6350 Cerebral infarction due to unspecified occlusion or stenosis of unspecified cerebral artery: Secondary | ICD-10-CM | POA: Diagnosis not present

## 2019-08-12 DIAGNOSIS — E669 Obesity, unspecified: Secondary | ICD-10-CM | POA: Diagnosis not present

## 2019-08-12 DIAGNOSIS — F419 Anxiety disorder, unspecified: Secondary | ICD-10-CM | POA: Diagnosis not present

## 2019-08-12 DIAGNOSIS — Z9981 Dependence on supplemental oxygen: Secondary | ICD-10-CM | POA: Diagnosis not present

## 2019-08-12 DIAGNOSIS — I5022 Chronic systolic (congestive) heart failure: Secondary | ICD-10-CM | POA: Diagnosis not present

## 2019-08-12 DIAGNOSIS — Z87440 Personal history of urinary (tract) infections: Secondary | ICD-10-CM | POA: Diagnosis not present

## 2019-08-13 DIAGNOSIS — Z87440 Personal history of urinary (tract) infections: Secondary | ICD-10-CM | POA: Diagnosis not present

## 2019-08-13 DIAGNOSIS — F419 Anxiety disorder, unspecified: Secondary | ICD-10-CM | POA: Diagnosis not present

## 2019-08-13 DIAGNOSIS — I6350 Cerebral infarction due to unspecified occlusion or stenosis of unspecified cerebral artery: Secondary | ICD-10-CM | POA: Diagnosis not present

## 2019-08-13 DIAGNOSIS — E669 Obesity, unspecified: Secondary | ICD-10-CM | POA: Diagnosis not present

## 2019-08-13 DIAGNOSIS — Z9981 Dependence on supplemental oxygen: Secondary | ICD-10-CM | POA: Diagnosis not present

## 2019-08-13 DIAGNOSIS — I5022 Chronic systolic (congestive) heart failure: Secondary | ICD-10-CM | POA: Diagnosis not present

## 2019-08-17 DIAGNOSIS — E669 Obesity, unspecified: Secondary | ICD-10-CM | POA: Diagnosis not present

## 2019-08-17 DIAGNOSIS — Z9981 Dependence on supplemental oxygen: Secondary | ICD-10-CM | POA: Diagnosis not present

## 2019-08-17 DIAGNOSIS — I6350 Cerebral infarction due to unspecified occlusion or stenosis of unspecified cerebral artery: Secondary | ICD-10-CM | POA: Diagnosis not present

## 2019-08-17 DIAGNOSIS — F419 Anxiety disorder, unspecified: Secondary | ICD-10-CM | POA: Diagnosis not present

## 2019-08-17 DIAGNOSIS — Z87440 Personal history of urinary (tract) infections: Secondary | ICD-10-CM | POA: Diagnosis not present

## 2019-08-17 DIAGNOSIS — I5022 Chronic systolic (congestive) heart failure: Secondary | ICD-10-CM | POA: Diagnosis not present

## 2019-08-19 DIAGNOSIS — I6350 Cerebral infarction due to unspecified occlusion or stenosis of unspecified cerebral artery: Secondary | ICD-10-CM | POA: Diagnosis not present

## 2019-08-19 DIAGNOSIS — I5022 Chronic systolic (congestive) heart failure: Secondary | ICD-10-CM | POA: Diagnosis not present

## 2019-08-19 DIAGNOSIS — Z9981 Dependence on supplemental oxygen: Secondary | ICD-10-CM | POA: Diagnosis not present

## 2019-08-19 DIAGNOSIS — Z87440 Personal history of urinary (tract) infections: Secondary | ICD-10-CM | POA: Diagnosis not present

## 2019-08-19 DIAGNOSIS — F419 Anxiety disorder, unspecified: Secondary | ICD-10-CM | POA: Diagnosis not present

## 2019-08-19 DIAGNOSIS — E669 Obesity, unspecified: Secondary | ICD-10-CM | POA: Diagnosis not present

## 2019-08-21 DIAGNOSIS — Z9981 Dependence on supplemental oxygen: Secondary | ICD-10-CM | POA: Diagnosis not present

## 2019-08-21 DIAGNOSIS — I5022 Chronic systolic (congestive) heart failure: Secondary | ICD-10-CM | POA: Diagnosis not present

## 2019-08-21 DIAGNOSIS — F419 Anxiety disorder, unspecified: Secondary | ICD-10-CM | POA: Diagnosis not present

## 2019-08-21 DIAGNOSIS — E669 Obesity, unspecified: Secondary | ICD-10-CM | POA: Diagnosis not present

## 2019-08-21 DIAGNOSIS — Z66 Do not resuscitate: Secondary | ICD-10-CM | POA: Diagnosis not present

## 2019-08-21 DIAGNOSIS — M1990 Unspecified osteoarthritis, unspecified site: Secondary | ICD-10-CM | POA: Diagnosis not present

## 2019-08-21 DIAGNOSIS — Z7401 Bed confinement status: Secondary | ICD-10-CM | POA: Diagnosis not present

## 2019-08-21 DIAGNOSIS — I6350 Cerebral infarction due to unspecified occlusion or stenosis of unspecified cerebral artery: Secondary | ICD-10-CM | POA: Diagnosis not present

## 2019-08-21 DIAGNOSIS — Z87440 Personal history of urinary (tract) infections: Secondary | ICD-10-CM | POA: Diagnosis not present

## 2019-08-21 DIAGNOSIS — Z741 Need for assistance with personal care: Secondary | ICD-10-CM | POA: Diagnosis not present

## 2019-08-24 DIAGNOSIS — I6350 Cerebral infarction due to unspecified occlusion or stenosis of unspecified cerebral artery: Secondary | ICD-10-CM | POA: Diagnosis not present

## 2019-08-24 DIAGNOSIS — E669 Obesity, unspecified: Secondary | ICD-10-CM | POA: Diagnosis not present

## 2019-08-24 DIAGNOSIS — F419 Anxiety disorder, unspecified: Secondary | ICD-10-CM | POA: Diagnosis not present

## 2019-08-24 DIAGNOSIS — Z87440 Personal history of urinary (tract) infections: Secondary | ICD-10-CM | POA: Diagnosis not present

## 2019-08-24 DIAGNOSIS — Z9981 Dependence on supplemental oxygen: Secondary | ICD-10-CM | POA: Diagnosis not present

## 2019-08-24 DIAGNOSIS — I5022 Chronic systolic (congestive) heart failure: Secondary | ICD-10-CM | POA: Diagnosis not present

## 2019-08-25 DIAGNOSIS — E669 Obesity, unspecified: Secondary | ICD-10-CM | POA: Diagnosis not present

## 2019-08-25 DIAGNOSIS — Z9981 Dependence on supplemental oxygen: Secondary | ICD-10-CM | POA: Diagnosis not present

## 2019-08-25 DIAGNOSIS — I6350 Cerebral infarction due to unspecified occlusion or stenosis of unspecified cerebral artery: Secondary | ICD-10-CM | POA: Diagnosis not present

## 2019-08-25 DIAGNOSIS — I5022 Chronic systolic (congestive) heart failure: Secondary | ICD-10-CM | POA: Diagnosis not present

## 2019-08-25 DIAGNOSIS — F419 Anxiety disorder, unspecified: Secondary | ICD-10-CM | POA: Diagnosis not present

## 2019-08-25 DIAGNOSIS — Z87440 Personal history of urinary (tract) infections: Secondary | ICD-10-CM | POA: Diagnosis not present

## 2019-08-26 ENCOUNTER — Other Ambulatory Visit: Payer: Self-pay | Admitting: Family Medicine

## 2019-08-26 DIAGNOSIS — Z87440 Personal history of urinary (tract) infections: Secondary | ICD-10-CM | POA: Diagnosis not present

## 2019-08-26 DIAGNOSIS — Z9981 Dependence on supplemental oxygen: Secondary | ICD-10-CM | POA: Diagnosis not present

## 2019-08-26 DIAGNOSIS — E669 Obesity, unspecified: Secondary | ICD-10-CM | POA: Diagnosis not present

## 2019-08-26 DIAGNOSIS — I5022 Chronic systolic (congestive) heart failure: Secondary | ICD-10-CM | POA: Diagnosis not present

## 2019-08-26 DIAGNOSIS — I6350 Cerebral infarction due to unspecified occlusion or stenosis of unspecified cerebral artery: Secondary | ICD-10-CM | POA: Diagnosis not present

## 2019-08-26 DIAGNOSIS — F419 Anxiety disorder, unspecified: Secondary | ICD-10-CM | POA: Diagnosis not present

## 2019-08-26 NOTE — Telephone Encounter (Signed)
Stacks. NTBS 30 days given 07/29/19

## 2019-08-27 ENCOUNTER — Other Ambulatory Visit: Payer: Self-pay | Admitting: *Deleted

## 2019-08-27 MED ORDER — FUROSEMIDE 20 MG PO TABS: 20.0000 mg | ORAL_TABLET | Freq: Every day | ORAL | 0 refills | Status: DC

## 2019-08-27 NOTE — Telephone Encounter (Signed)
Son called - appt made for 5/21- meds sent in to last until appt

## 2019-08-31 DIAGNOSIS — F419 Anxiety disorder, unspecified: Secondary | ICD-10-CM | POA: Diagnosis not present

## 2019-08-31 DIAGNOSIS — Z87440 Personal history of urinary (tract) infections: Secondary | ICD-10-CM | POA: Diagnosis not present

## 2019-08-31 DIAGNOSIS — E669 Obesity, unspecified: Secondary | ICD-10-CM | POA: Diagnosis not present

## 2019-08-31 DIAGNOSIS — I6350 Cerebral infarction due to unspecified occlusion or stenosis of unspecified cerebral artery: Secondary | ICD-10-CM | POA: Diagnosis not present

## 2019-08-31 DIAGNOSIS — Z9981 Dependence on supplemental oxygen: Secondary | ICD-10-CM | POA: Diagnosis not present

## 2019-08-31 DIAGNOSIS — I5022 Chronic systolic (congestive) heart failure: Secondary | ICD-10-CM | POA: Diagnosis not present

## 2019-09-02 ENCOUNTER — Other Ambulatory Visit: Payer: Self-pay

## 2019-09-02 ENCOUNTER — Emergency Department (HOSPITAL_COMMUNITY)
Admission: EM | Admit: 2019-09-02 | Discharge: 2019-09-02 | Disposition: A | Discharge status: Home / Self Care | Attending: Emergency Medicine | Admitting: Emergency Medicine

## 2019-09-02 ENCOUNTER — Encounter (HOSPITAL_COMMUNITY): Payer: Self-pay

## 2019-09-02 DIAGNOSIS — Z9981 Dependence on supplemental oxygen: Secondary | ICD-10-CM | POA: Diagnosis not present

## 2019-09-02 DIAGNOSIS — F419 Anxiety disorder, unspecified: Secondary | ICD-10-CM | POA: Diagnosis not present

## 2019-09-02 DIAGNOSIS — R5381 Other malaise: Secondary | ICD-10-CM | POA: Diagnosis not present

## 2019-09-02 DIAGNOSIS — I959 Hypotension, unspecified: Secondary | ICD-10-CM | POA: Diagnosis not present

## 2019-09-02 DIAGNOSIS — I5022 Chronic systolic (congestive) heart failure: Secondary | ICD-10-CM | POA: Diagnosis not present

## 2019-09-02 DIAGNOSIS — I10 Essential (primary) hypertension: Secondary | ICD-10-CM | POA: Insufficient documentation

## 2019-09-02 DIAGNOSIS — N3000 Acute cystitis without hematuria: Secondary | ICD-10-CM | POA: Insufficient documentation

## 2019-09-02 DIAGNOSIS — I6350 Cerebral infarction due to unspecified occlusion or stenosis of unspecified cerebral artery: Secondary | ICD-10-CM | POA: Diagnosis not present

## 2019-09-02 DIAGNOSIS — E669 Obesity, unspecified: Secondary | ICD-10-CM | POA: Diagnosis not present

## 2019-09-02 DIAGNOSIS — Z79899 Other long term (current) drug therapy: Secondary | ICD-10-CM | POA: Insufficient documentation

## 2019-09-02 DIAGNOSIS — Z87440 Personal history of urinary (tract) infections: Secondary | ICD-10-CM | POA: Diagnosis not present

## 2019-09-02 DIAGNOSIS — Z91040 Latex allergy status: Secondary | ICD-10-CM | POA: Insufficient documentation

## 2019-09-02 DIAGNOSIS — Z7401 Bed confinement status: Secondary | ICD-10-CM | POA: Diagnosis not present

## 2019-09-02 DIAGNOSIS — I67858 Other hereditary cerebrovascular disease: Secondary | ICD-10-CM | POA: Diagnosis not present

## 2019-09-02 DIAGNOSIS — R4182 Altered mental status, unspecified: Secondary | ICD-10-CM | POA: Diagnosis present

## 2019-09-02 DIAGNOSIS — R0902 Hypoxemia: Secondary | ICD-10-CM | POA: Diagnosis not present

## 2019-09-02 LAB — URINALYSIS, ROUTINE W REFLEX MICROSCOPIC
Bilirubin Urine: NEGATIVE
Glucose, UA: NEGATIVE mg/dL
Hgb urine dipstick: NEGATIVE
Ketones, ur: NEGATIVE mg/dL
Nitrite: NEGATIVE
Protein, ur: NEGATIVE mg/dL
Specific Gravity, Urine: 1.005 (ref 1.005–1.030)
WBC, UA: >50 WBC/hpf — ABNORMAL HIGH (ref 0–5)
pH: 7 (ref 5.0–8.0)

## 2019-09-02 LAB — CBG MONITORING, ED: Glucose-Capillary: 86 mg/dL (ref 70–99)

## 2019-09-02 MED ORDER — CEPHALEXIN 500 MG PO CAPS: 500.0000 mg | ORAL_CAPSULE | Freq: Four times a day (QID) | ORAL | 0 refills | Status: DC

## 2019-09-02 MED ORDER — SODIUM CHLORIDE 0.9 % IV SOLN
1.0000 g | Freq: Once | INTRAVENOUS | Status: AC
  Administered 2019-09-02: 1 g via INTRAVENOUS
  Filled 2019-09-02: qty 10

## 2019-09-02 NOTE — ED Notes (Signed)
Tech pushing po fluids

## 2019-09-02 NOTE — ED Triage Notes (Signed)
Pt in by [facility] from home for altered mental status, possible uti.  Pt is awake and alert, oriented.

## 2019-09-02 NOTE — ED Provider Notes (Signed)
[facility] EMERGENCY DEPARTMENT Provider Note   CSN: [id number] Arrival date & time: 09/02/19  1844     History Chief Complaint  Patient presents with  . Altered Mental Status    Megan Fisher is a 86 y.o. female.  Patient has been mildly confused.  Family states this happens when she gets a bladder infection.  The history is provided by the patient and a relative. No language interpreter was used.  Altered Mental Status Presenting symptoms: no behavior changes   Severity:  Mild Most recent episode:  Today Episode history:  Multiple Timing:  Constant Progression:  Improving Chronicity:  New Context: not alcohol use   Associated symptoms: no abdominal pain, no hallucinations, no headaches, no rash and no seizures        Past Medical History:  Diagnosis Date  . Arthritis   . Hernia   . Hypertension   . Obesity   . Vertigo     Patient Active Problem List   Diagnosis Date Noted  . Palliative care by specialist   . Goals of care, counseling/discussion   . Acute heart failure (HCC)   . Weakness   . DNR (do not resuscitate) 03/31/2019  . Sepsis secondary to UTI (HCC) 03/30/2019  . UTI (urinary tract infection) 03/30/2019  . Depression 03/30/2019  . Dementia (HCC) 03/30/2019  . History of CVA (cerebrovascular accident) 03/30/2019  . Urine frequency 05/10/2013  . Arthritis   . Hypertension   . Vertigo   . Obesity   . Ventral hernia     Past Surgical History:  Procedure Laterality Date  . BREAST SURGERY    . CHOLECYSTECTOMY    . HERNIA REPAIR    . ooperectomy    . TONSILLECTOMY       OB History   No obstetric history on file.     No family history on file.  Social History   Tobacco Use  . Smoking status: Never Smoker  . Smokeless tobacco: Never Used  Substance Use Topics  . Alcohol use: No  . Drug use: No    Home Medications Prior to Admission medications   Medication Sig Start Date End Date Taking? Authorizing Provider  acetaminophen  (TYLENOL) 500 MG tablet Take 500 mg by mouth every 6 (six) hours as needed for mild pain or moderate pain.    Yes [provider]  benazepril (LOTENSIN) 10 MG tablet One tab by mouth daily. Needs appointment for future refills. Patient taking differently: Take 10 mg by mouth daily. One tab by mouth daily. Needs appointment for future refills. 08/09/19  Yes Stacks, Broadus John, MD  calcium carbonate (OS-CAL - DOSED IN MG OF ELEMENTAL CALCIUM) 1250 (500 Ca) MG tablet Take 1 tablet by mouth 2 (two) times daily with a meal.    Yes [provider]  cholecalciferol (VITAMIN D) 1000 UNITS tablet Take 1,000 Units by mouth daily.   Yes [provider]  Cranberry 450 MG CAPS Take 1 capsule by mouth daily.    Yes [provider]  diclofenac sodium (VOLTAREN) 1 % GEL Apply 2 g topically 4 (four) times daily as needed (for pain).    Yes [provider]  furosemide (LASIX) 20 MG tablet Take 1 tablet (20 mg total) by mouth daily. (Needs to be seen before next refill) 08/27/19  Yes Stacks, Broadus John, MD  ipratropium-albuterol (DUONEB) 0.5-2.5 (3) MG/3ML SOLN Inhale 3 mLs into the lungs every 6 (six) hours as needed. 09/18/17  Yes [provider]  loratadine (CLARITIN) 10 MG tablet Take 10 mg by mouth daily.   Yes [provider]  LORazepam (ATIVAN) 0.5 MG tablet Take 1 tablet (0.5 mg total) by mouth at bedtime. Patient taking differently: Take 0.5 mg by mouth at bedtime as needed for sleep.  10/05/18  Yes Claretta Fraise, MD  meclizine (ANTIVERT) 12.5 MG tablet Take 1 tablet (12.5 mg total) by mouth 2 (two) times daily as needed for dizziness. Needs appointment for future refills 08/09/19  Yes Stacks, Cletus Gash, MD  omeprazole (PRILOSEC) 20 MG capsule One cap by mouth daily. Needs appointment for future refills. Patient taking differently: Take 20 mg by mouth daily. One cap by mouth daily. Needs appointment for future refills. 08/09/19  Yes Claretta Fraise, MD  OXYGEN Inhale  2 L into the lungs continuous.   Yes [provider]  PARoxetine (PAXIL) 20 MG tablet TAKE 1 TABLET IN MORNING Patient taking differently: Take 30 mg by mouth every morning.  09/21/18  Yes Claretta Fraise, MD  promethazine (PHENERGAN) 25 MG tablet Take 25 mg by mouth every 6 (six) hours as needed for nausea or vomiting.   Yes [provider]  vitamin B-12 (CYANOCOBALAMIN) 1000 MCG tablet Take 1,000 mcg by mouth daily.   Yes [provider]  cephALEXin (KEFLEX) 500 MG capsule Take 1 capsule (500 mg total) by mouth 4 (four) times daily. 09/02/19   Milton Ferguson, MD    Allergies    Propoxyphene, Ciprofloxacin, Citalopram, Codeine, Latex, Levaquin [levofloxacin in d5w], Oxycontin [oxycodone hcl], Seroquel [quetiapine], Sulfa antibiotics, Ultram [tramadol], Zoloft [sertraline hcl], Adhesive [tape], Aspirin, and Penicillins  Review of Systems   Review of Systems  Constitutional: Negative for appetite change and fatigue.  HENT: Negative for congestion, ear discharge and sinus pressure.   Eyes: Negative for discharge.  Respiratory: Negative for cough.   Cardiovascular: Negative for chest pain.  Gastrointestinal: Negative for abdominal pain and diarrhea.  Genitourinary: Negative for frequency and hematuria.  Musculoskeletal: Negative for back pain.  Skin: Negative for rash.  Neurological: Negative for seizures and headaches.  Psychiatric/Behavioral: Negative for hallucinations.    Physical Exam Updated Vital Signs BP (!) 122/59   Pulse 93   Temp 98.2 F (36.8 C) (Oral)   Resp (!) 24   Ht 5\' 5"  (1.651 m)   Wt 101 kg   SpO2 97%   BMI 37.05 kg/m   Physical Exam Vitals and nursing note reviewed.  Constitutional:      Appearance: She is well-developed.  HENT:     Head: Normocephalic.     Nose: Nose normal.  Eyes:     General: No scleral icterus.    Conjunctiva/sclera: Conjunctivae normal.  Neck:     Thyroid: No thyromegaly.  Cardiovascular:     Rate and  Rhythm: Normal rate and regular rhythm.     Heart sounds: No murmur. No friction rub. No gallop.   Pulmonary:     Breath sounds: No stridor. No wheezing or rales.  Chest:     Chest wall: No tenderness.  Abdominal:     General: There is no distension.     Tenderness: There is no abdominal tenderness. There is no rebound.  Musculoskeletal:        General: Normal range of motion.     Cervical back: Neck supple.  Lymphadenopathy:     Cervical: No cervical adenopathy.  Skin:    Findings: No erythema or rash.  Neurological:     Mental Status: She is alert and oriented  to person, place, and time.     Motor: No abnormal muscle tone.     Coordination: Coordination normal.     Comments: Patient new who she was.  While she was there and it and was able to answer questions appropriately  Psychiatric:        Behavior: Behavior normal.     ED Results / Procedures / Treatments   Labs (all labs ordered are listed, but only abnormal results are displayed) Labs Reviewed  URINALYSIS, ROUTINE W REFLEX MICROSCOPIC - Abnormal; Notable for the following components:      Result Value   Leukocytes,Ua LARGE (*)    WBC, UA >50 (*)    Bacteria, UA RARE (*)    All other components within normal limits  URINE CULTURE  CBG MONITORING, ED    EKG None  Radiology No results found.  Procedures Procedures (including critical care time)  Medications Ordered in ED Medications  cefTRIAXone (ROCEPHIN) 1 g in sodium chloride 0.9 % 100 mL IVPB (1 g Intravenous New Bag/Given 09/02/19 2039)    ED Course  I have reviewed the triage vital signs and the nursing notes.  Pertinent labs & imaging results that were available during my care of the patient were reviewed by me and considered in my medical decision making (see chart for details).    MDM Rules/Calculators/A&P                      UA shows urinary tract infection.  Glucose normal.  Patient given Rocephin and sent home on Keflex     This  patient presents to the ED for concern of confusion, this involves an extensive number of treatment options, and is a complaint that carries with it a high risk of complications and morbidity.  The differential diagnosis includes UTI stroke   Lab Tests:   I Ordered, reviewed, and interpreted labs, which included glucose urinalysis.  Urinalysis shows UTI  Medicines ordered:   I ordered medication Rocephin  Imaging Studies ordered:     Additional history obtained from daughter  Previous records obtained and reviewed  Consultations Obtained:   Reevaluation:  After the interventions stated above, I reevaluated the patient and found improved  Critical Interventions:  .   Final Clinical Impression(s) / ED Diagnoses Final diagnoses:  Acute cystitis without hematuria    Rx / DC Orders ED Discharge Orders         Ordered    cephALEXin (KEFLEX) 500 MG capsule  4 times daily     09/02/19 2057           Bethann Berkshire, MD 09/02/19 2108

## 2019-09-02 NOTE — Discharge Instructions (Addendum)
Drink plenty of fluids and follow-up with your doctor next week for recheck °

## 2019-09-04 LAB — URINE CULTURE: Culture: >=100000 — AB

## 2019-09-07 DIAGNOSIS — I6350 Cerebral infarction due to unspecified occlusion or stenosis of unspecified cerebral artery: Secondary | ICD-10-CM | POA: Diagnosis not present

## 2019-09-07 DIAGNOSIS — F419 Anxiety disorder, unspecified: Secondary | ICD-10-CM | POA: Diagnosis not present

## 2019-09-07 DIAGNOSIS — Z9981 Dependence on supplemental oxygen: Secondary | ICD-10-CM | POA: Diagnosis not present

## 2019-09-07 DIAGNOSIS — I5022 Chronic systolic (congestive) heart failure: Secondary | ICD-10-CM | POA: Diagnosis not present

## 2019-09-07 DIAGNOSIS — E669 Obesity, unspecified: Secondary | ICD-10-CM | POA: Diagnosis not present

## 2019-09-07 DIAGNOSIS — Z87440 Personal history of urinary (tract) infections: Secondary | ICD-10-CM | POA: Diagnosis not present

## 2019-09-09 DIAGNOSIS — Z9981 Dependence on supplemental oxygen: Secondary | ICD-10-CM | POA: Diagnosis not present

## 2019-09-09 DIAGNOSIS — E669 Obesity, unspecified: Secondary | ICD-10-CM | POA: Diagnosis not present

## 2019-09-09 DIAGNOSIS — F419 Anxiety disorder, unspecified: Secondary | ICD-10-CM | POA: Diagnosis not present

## 2019-09-09 DIAGNOSIS — I5022 Chronic systolic (congestive) heart failure: Secondary | ICD-10-CM | POA: Diagnosis not present

## 2019-09-09 DIAGNOSIS — I6350 Cerebral infarction due to unspecified occlusion or stenosis of unspecified cerebral artery: Secondary | ICD-10-CM | POA: Diagnosis not present

## 2019-09-09 DIAGNOSIS — Z87440 Personal history of urinary (tract) infections: Secondary | ICD-10-CM | POA: Diagnosis not present

## 2019-09-10 ENCOUNTER — Other Ambulatory Visit: Payer: Self-pay

## 2019-09-10 ENCOUNTER — Ambulatory Visit (INDEPENDENT_AMBULATORY_CARE_PROVIDER_SITE_OTHER): Payer: Medicare Other | Admitting: Family Medicine

## 2019-09-10 ENCOUNTER — Encounter: Payer: Self-pay | Admitting: Family Medicine

## 2019-09-10 VITALS — BP 118/68 | HR 127 | Temp 97.2°F | Resp 20

## 2019-09-10 DIAGNOSIS — F0390 Unspecified dementia without behavioral disturbance: Secondary | ICD-10-CM

## 2019-09-10 DIAGNOSIS — I6930 Unspecified sequelae of cerebral infarction: Secondary | ICD-10-CM

## 2019-09-10 DIAGNOSIS — Z515 Encounter for palliative care: Secondary | ICD-10-CM | POA: Diagnosis not present

## 2019-09-10 DIAGNOSIS — I959 Hypotension, unspecified: Secondary | ICD-10-CM | POA: Diagnosis not present

## 2019-09-10 DIAGNOSIS — I10 Essential (primary) hypertension: Secondary | ICD-10-CM | POA: Diagnosis not present

## 2019-09-10 DIAGNOSIS — I69354 Hemiplegia and hemiparesis following cerebral infarction affecting left non-dominant side: Secondary | ICD-10-CM

## 2019-09-10 DIAGNOSIS — Z7401 Bed confinement status: Secondary | ICD-10-CM | POA: Diagnosis not present

## 2019-09-10 DIAGNOSIS — R531 Weakness: Secondary | ICD-10-CM | POA: Diagnosis not present

## 2019-09-10 DIAGNOSIS — R404 Transient alteration of awareness: Secondary | ICD-10-CM | POA: Diagnosis not present

## 2019-09-10 MED ORDER — DOXYCYCLINE HYCLATE 100 MG PO CAPS: 100.0000 mg | ORAL_CAPSULE | Freq: Two times a day (BID) | ORAL | 0 refills | Status: DC

## 2019-09-10 MED ORDER — DONEPEZIL HCL 5 MG PO TABS
5.0000 mg | ORAL_TABLET | Freq: Every day | ORAL | 2 refills | Status: DC
Start: 2019-09-10 — End: 2019-12-06

## 2019-09-10 NOTE — Progress Notes (Signed)
Subjective:  Patient ID: Megan Fisher, female    DOB: 09/05/1928  Age: 86 y.o. 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  Daughter says she has been more confused and sometimes hostile.  Patient also has had left upper extremity weakness but has been read bedridden due to weakness of the lower extremities.  She is not using the right hand either anymore.  Depression screen [facility] 2/9 09/21/2018 10/31/2014 09/27/2013  Decreased Interest 0 3 0  Down, Depressed, Hopeless 0 3 0  PHQ - 2 Score 0 6 0  Altered sleeping - 3 -  Tired, decreased energy - 3 -  Change in appetite - 2 -  Feeling bad or failure about yourself  - 3 -  Trouble concentrating - 2 -  Moving slowly or fidgety/restless - 2 -  Suicidal thoughts - 0 -  PHQ-9 Score - 21 -    History Megan Fisher has a past medical history of Arthritis, Hernia, Hypertension, Obesity, Sepsis secondary to UTI (Megan Fisher) (03/30/2019), and Vertigo.   She has a past surgical history that includes ooperectomy; Tonsillectomy; Hernia repair; Cholecystectomy; and Breast surgery.   Her family history is not on file.She reports that she has never smoked. She has never used smokeless tobacco. She reports that she does not drink alcohol or use drugs.    ROS Review of Systems  Constitutional: Negative.   HENT: Negative.   Eyes: Negative for visual disturbance.  Respiratory: Negative for shortness of breath.   Cardiovascular: Negative for chest pain.  Gastrointestinal: Negative for abdominal pain.  Musculoskeletal: Negative for arthralgias.    Objective:  BP 118/68   Pulse (!) 127   Temp (!) 97.2 F (36.2 C) (Temporal)   Resp 20   SpO2 93%   BP Readings from Last 3 Encounters:  09/12/19 94/66  09/10/19 118/68  09/02/19 (!) 104/55    Wt Readings from Last 3 Encounters:  09/12/19 222 lb 10.6 oz (101  kg)  09/02/19 222 lb 10.6 oz (101 kg)  04/03/19 242 lb 15.2 oz (110.2 kg)     Physical Exam Constitutional:      General: She is not in acute distress.    Appearance: She is well-developed. She is obese. She is ill-appearing.     Comments: Bedridden.  She was brought in by EMS on a stretcher and is examined on the stretcher.  She is moving her legs but not her arms.  Hands are contractured.  HENT:     Head: Normocephalic and atraumatic.     Right Ear: External ear normal.     Left Ear: External ear normal.     Nose: Nose normal.  Eyes:     Conjunctiva/sclera: Conjunctivae normal.     Pupils: Pupils are equal, round, and reactive to light.  Neck:     Thyroid: No thyromegaly.  Cardiovascular:     Rate and Rhythm: Normal rate and regular rhythm.     Heart sounds: Normal heart sounds. No murmur.  Pulmonary:     Effort: Pulmonary effort is normal. No respiratory distress.     Breath sounds: Normal breath sounds. No wheezing or rales.  Abdominal:     General: Bowel sounds are normal. There is no distension.     Palpations: Abdomen is soft.     Tenderness: There is no abdominal tenderness.  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     Cervical back: Normal range of motion and neck supple.  Lymphadenopathy:     Cervical: No cervical adenopathy.  Skin:    General: Skin is warm and dry.  Neurological:     Mental Status: She is alert and oriented to person, place, and time.     Deep Tendon Reflexes: Reflexes are normal and symmetric.  Psychiatric:        Attention and Perception: Attention normal.        Mood and Affect: Affect normal.        Speech: Speech is slurred.        Behavior: Behavior is cooperative.        Cognition and Memory: Cognition is impaired.       Assessment & Plan:   [name] was seen today for medical management of chronic issues.  Diagnoses and all orders for this visit:  Dementia  without behavioral disturbance, unspecified dementia type (HCC) -     Brain natriuretic peptide -     CBC with Differential/Platelet -     CMP14+EGFR  Late effect of cerebrovascular accident (CVA) -     Brain natriuretic peptide -     CBC with Differential/Platelet -     CMP14+EGFR  Palliative care by specialist -     Brain natriuretic peptide -     CBC with Differential/Platelet -     CMP14+EGFR  Hemiparesis affecting left side as late effect of cerebrovascular accident (CVA) ([place]) -     Brain natriuretic peptide -     CBC with Differential/Platelet -     CMP14+EGFR  Essential hypertension  Other orders -     doxycycline (VIBRAMYCIN) 100 MG capsule; Take 1 capsule (100 mg total) by mouth 2 (two) times daily. -     donepezil (ARICEPT) 5 MG tablet; Take 1 tablet (5 mg total) by mouth at bedtime.       I have discontinued Milyn W. Ricotta's cephALEXin. I am also having her start on doxycycline and donepezil. Additionally, I am having her maintain her cholecalciferol, vitamin B-12, loratadine, Cranberry, calcium carbonate, acetaminophen, diclofenac sodium, promethazine, PARoxetine, LORazepam, benazepril, omeprazole, meclizine, furosemide, ipratropium-albuterol, and OXYGEN.  Allergies as of 09/10/2019      Reactions   Propoxyphene Anaphylaxis   Ciprofloxacin Diarrhea   Citalopram Other (See Comments)   Reaction is unknown   Codeine Other (See Comments)   Altered mental status "Makes Crazy"   Latex    Levaquin [levofloxacin In D5w] Diarrhea   Oxycontin [oxycodone Hcl] Other (See Comments)   Altered mental status   Seroquel [quetiapine] Other (See Comments)   Hallucinations    Sulfa Antibiotics Swelling   Ultram [tramadol]    AMS   Zoloft [sertraline Hcl] Other (See Comments)   Adhesive [tape] Itching, Swelling, Rash   Paper tape is ok   Aspirin Rash   Penicillins Swelling, Rash      Medication List       Accurate as of Sep 10, 2019 11:59 PM. If you have any  questions, ask your nurse or doctor.        STOP taking these medications   cephALEXin 500 MG capsule Commonly known as: KEFLEX Stopped by: Claretta Fraise, MD     TAKE these medications   acetaminophen 500 MG tablet Commonly known as: TYLENOL Take 500 mg by mouth every 6 (six) hours as needed for mild pain or moderate pain.  benazepril 10 MG tablet Commonly known as: LOTENSIN One tab by mouth daily. Needs appointment for future refills. What changed:   how much to take  how to take this  when to take this   calcium carbonate 1250 (500 Ca) MG tablet Commonly known as: OS-CAL - dosed in mg of elemental calcium Take 1 tablet by mouth 2 (two) times daily with a meal.   cholecalciferol 1000 units tablet Commonly known as: VITAMIN D Take 1,000 Units by mouth daily.   Cranberry 450 MG Caps Take 1 capsule by mouth daily.   diclofenac sodium 1 % Gel Commonly known as: VOLTAREN Apply 2 g topically 4 (four) times daily as needed (for pain).   donepezil 5 MG tablet Commonly known as: Aricept Take 1 tablet (5 mg total) by mouth at bedtime. Started by: Claretta Fraise, MD   doxycycline 100 MG capsule Commonly known as: Vibramycin Take 1 capsule (100 mg total) by mouth 2 (two) times daily. Started by: Claretta Fraise, MD   furosemide 20 MG tablet Commonly known as: LASIX Take 1 tablet (20 mg total) by mouth daily. (Needs to be seen before next refill)   ipratropium-albuterol 0.5-2.5 (3) MG/3ML Soln Commonly known as: DUONEB Inhale 3 mLs into the lungs every 6 (six) hours as needed.   loratadine 10 MG tablet Commonly known as: CLARITIN Take 10 mg by mouth daily.   LORazepam 0.5 MG tablet Commonly known as: ATIVAN Take 1 tablet (0.5 mg total) by mouth at bedtime. What changed:   when to take this  reasons to take this   meclizine 12.5 MG tablet Commonly known as: ANTIVERT Take 1 tablet (12.5 mg total) by mouth 2 (two) times daily as needed for dizziness. Needs  appointment for future refills   omeprazole 20 MG capsule Commonly known as: PRILOSEC One cap by mouth daily. Needs appointment for future refills. What changed:   how much to take  how to take this  when to take this   OXYGEN Inhale 2 L into the lungs continuous.   PARoxetine 20 MG tablet Commonly known as: PAXIL TAKE 1 TABLET IN MORNING What changed:   how much to take  how to take this  when to take this  additional instructions   promethazine 25 MG tablet Commonly known as: PHENERGAN Take 25 mg by mouth every 6 (six) hours as needed for nausea or vomiting.   vitamin B-12 1000 MCG tablet Commonly known as: CYANOCOBALAMIN Take 1,000 mcg by mouth daily.        Follow-up: Return in about 3 months (around 12/24/2019).  Claretta Fraise, M.D.

## 2019-09-11 LAB — CBC WITH DIFFERENTIAL/PLATELET
Basophils Absolute: 0.1 10*3/uL (ref 0.0–0.2)
Basos: 1 %
EOS (ABSOLUTE): 0.1 10*3/uL (ref 0.0–0.4)
Eos: 2 %
Hematocrit: 33.9 % — ABNORMAL LOW (ref 34.0–46.6)
Hemoglobin: 10.9 g/dL — ABNORMAL LOW (ref 11.1–15.9)
Immature Grans (Abs): 0 10*3/uL (ref 0.0–0.1)
Immature Granulocytes: 0 %
Lymphocytes Absolute: 3.1 10*3/uL (ref 0.7–3.1)
Lymphs: 41 %
MCH: 30.5 pg (ref 26.6–33.0)
MCHC: 32.2 g/dL (ref 31.5–35.7)
MCV: 95 fL (ref 79–97)
Monocytes Absolute: 0.5 10*3/uL (ref 0.1–0.9)
Monocytes: 7 %
Neutrophils Absolute: 3.8 10*3/uL (ref 1.4–7.0)
Neutrophils: 49 %
Platelets: 243 10*3/uL (ref 150–450)
RBC: 3.57 x10E6/uL — ABNORMAL LOW (ref 3.77–5.28)
RDW: 13.4 % (ref 11.7–15.4)
WBC: 7.6 10*3/uL (ref 3.4–10.8)

## 2019-09-11 LAB — CMP14+EGFR
ALT: 15 IU/L (ref 0–32)
AST: 25 IU/L (ref 0–40)
Albumin/Globulin Ratio: 0.8 — ABNORMAL LOW (ref 1.2–2.2)
Albumin: 2.2 g/dL — ABNORMAL LOW (ref 3.6–4.6)
Alkaline Phosphatase: 78 IU/L (ref 48–121)
BUN/Creatinine Ratio: 12 (ref 12–28)
BUN: 8 mg/dL (ref 8–27)
Bilirubin Total: 0.4 mg/dL (ref 0.0–1.2)
CO2: 29 mmol/L (ref 20–29)
Calcium: 8.3 mg/dL — ABNORMAL LOW (ref 8.7–10.3)
Chloride: 101 mmol/L (ref 96–106)
Creatinine, Ser: 0.67 mg/dL (ref 0.57–1.00)
GFR calc Af Amer: 90 mL/min/{1.73_m2} (ref >59)
GFR calc non Af Amer: 78 mL/min/{1.73_m2} (ref >59)
Globulin, Total: 2.8 g/dL (ref 1.5–4.5)
Glucose: 92 mg/dL (ref 65–99)
Potassium: 4.4 mmol/L (ref 3.5–5.2)
Sodium: 139 mmol/L (ref 134–144)
Total Protein: 5 g/dL — ABNORMAL LOW (ref 6.0–8.5)

## 2019-09-11 LAB — BRAIN NATRIURETIC PEPTIDE: BNP: 217 pg/mL — ABNORMAL HIGH (ref 0.0–100.0)

## 2019-09-12 ENCOUNTER — Inpatient Hospital Stay (HOSPITAL_COMMUNITY)
Admission: EM | Admit: 2019-09-12 | Discharge: 2019-09-14 | DRG: 689 | Disposition: A | Discharge status: Hospice | Payer: Medicare Other | Attending: Internal Medicine | Admitting: Internal Medicine

## 2019-09-12 ENCOUNTER — Other Ambulatory Visit: Payer: Self-pay

## 2019-09-12 ENCOUNTER — Encounter (HOSPITAL_COMMUNITY): Payer: Self-pay | Admitting: Emergency Medicine

## 2019-09-12 ENCOUNTER — Encounter: Payer: Self-pay | Admitting: Family Medicine

## 2019-09-12 ENCOUNTER — Emergency Department (HOSPITAL_COMMUNITY): Payer: Medicare Other

## 2019-09-12 DIAGNOSIS — Z66 Do not resuscitate: Secondary | ICD-10-CM | POA: Diagnosis not present

## 2019-09-12 DIAGNOSIS — B952 Enterococcus as the cause of diseases classified elsewhere: Secondary | ICD-10-CM | POA: Diagnosis present

## 2019-09-12 DIAGNOSIS — R410 Disorientation, unspecified: Secondary | ICD-10-CM | POA: Diagnosis not present

## 2019-09-12 DIAGNOSIS — R531 Weakness: Secondary | ICD-10-CM | POA: Diagnosis not present

## 2019-09-12 DIAGNOSIS — Z79899 Other long term (current) drug therapy: Secondary | ICD-10-CM

## 2019-09-12 DIAGNOSIS — E860 Dehydration: Secondary | ICD-10-CM | POA: Diagnosis present

## 2019-09-12 DIAGNOSIS — Z7951 Long term (current) use of inhaled steroids: Secondary | ICD-10-CM

## 2019-09-12 DIAGNOSIS — N3000 Acute cystitis without hematuria: Principal | ICD-10-CM | POA: Diagnosis present

## 2019-09-12 DIAGNOSIS — I959 Hypotension, unspecified: Secondary | ICD-10-CM | POA: Diagnosis not present

## 2019-09-12 DIAGNOSIS — N390 Urinary tract infection, site not specified: Secondary | ICD-10-CM | POA: Diagnosis present

## 2019-09-12 DIAGNOSIS — G9341 Metabolic encephalopathy: Secondary | ICD-10-CM | POA: Diagnosis not present

## 2019-09-12 DIAGNOSIS — R4182 Altered mental status, unspecified: Secondary | ICD-10-CM | POA: Diagnosis not present

## 2019-09-12 DIAGNOSIS — Z87440 Personal history of urinary (tract) infections: Secondary | ICD-10-CM

## 2019-09-12 DIAGNOSIS — Z9981 Dependence on supplemental oxygen: Secondary | ICD-10-CM

## 2019-09-12 DIAGNOSIS — E669 Obesity, unspecified: Secondary | ICD-10-CM | POA: Diagnosis present

## 2019-09-12 DIAGNOSIS — F419 Anxiety disorder, unspecified: Secondary | ICD-10-CM | POA: Diagnosis present

## 2019-09-12 DIAGNOSIS — Z6837 Body mass index (BMI) 37.0-37.9, adult: Secondary | ICD-10-CM

## 2019-09-12 DIAGNOSIS — R0902 Hypoxemia: Secondary | ICD-10-CM | POA: Diagnosis not present

## 2019-09-12 DIAGNOSIS — F0390 Unspecified dementia without behavioral disturbance: Secondary | ICD-10-CM | POA: Diagnosis present

## 2019-09-12 DIAGNOSIS — I69354 Hemiplegia and hemiparesis following cerebral infarction affecting left non-dominant side: Secondary | ICD-10-CM | POA: Diagnosis not present

## 2019-09-12 DIAGNOSIS — G9340 Encephalopathy, unspecified: Secondary | ICD-10-CM | POA: Diagnosis present

## 2019-09-12 DIAGNOSIS — Z20822 Contact with and (suspected) exposure to covid-19: Secondary | ICD-10-CM | POA: Diagnosis present

## 2019-09-12 DIAGNOSIS — I10 Essential (primary) hypertension: Secondary | ICD-10-CM | POA: Diagnosis present

## 2019-09-12 DIAGNOSIS — K219 Gastro-esophageal reflux disease without esophagitis: Secondary | ICD-10-CM | POA: Diagnosis present

## 2019-09-12 DIAGNOSIS — F329 Major depressive disorder, single episode, unspecified: Secondary | ICD-10-CM | POA: Diagnosis present

## 2019-09-12 LAB — URINALYSIS, ROUTINE W REFLEX MICROSCOPIC
Bilirubin Urine: NEGATIVE
Glucose, UA: NEGATIVE mg/dL
Hgb urine dipstick: NEGATIVE
Ketones, ur: NEGATIVE mg/dL
Nitrite: NEGATIVE
Protein, ur: NEGATIVE mg/dL
Specific Gravity, Urine: 1.011 (ref 1.005–1.030)
WBC, UA: >50 WBC/hpf — ABNORMAL HIGH (ref 0–5)
pH: 7 (ref 5.0–8.0)

## 2019-09-12 LAB — COMPREHENSIVE METABOLIC PANEL
ALT: 17 U/L (ref 0–44)
AST: 23 U/L (ref 15–41)
Albumin: 1.8 g/dL — ABNORMAL LOW (ref 3.5–5.0)
Alkaline Phosphatase: 66 U/L (ref 38–126)
Anion gap: 7 (ref 5–15)
BUN: 10 mg/dL (ref 8–23)
CO2: 30 mmol/L (ref 22–32)
Calcium: 7.9 mg/dL — ABNORMAL LOW (ref 8.9–10.3)
Chloride: 102 mmol/L (ref 98–111)
Creatinine, Ser: 0.67 mg/dL (ref 0.44–1.00)
GFR calc Af Amer: >60 mL/min (ref >60)
GFR calc non Af Amer: >60 mL/min (ref >60)
Glucose, Bld: 93 mg/dL (ref 70–99)
Potassium: 3.5 mmol/L (ref 3.5–5.1)
Sodium: 139 mmol/L (ref 135–145)
Total Bilirubin: 0.7 mg/dL (ref 0.3–1.2)
Total Protein: 4.9 g/dL — ABNORMAL LOW (ref 6.5–8.1)

## 2019-09-12 LAB — PROTIME-INR
INR: 1.1 (ref 0.8–1.2)
Prothrombin Time: 13.3 seconds (ref 11.4–15.2)

## 2019-09-12 LAB — CBC WITH DIFFERENTIAL/PLATELET
Abs Immature Granulocytes: 0.01 10*3/uL (ref 0.00–0.07)
Basophils Absolute: 0 10*3/uL (ref 0.0–0.1)
Basophils Relative: 1 %
Eosinophils Absolute: 0.2 10*3/uL (ref 0.0–0.5)
Eosinophils Relative: 2 %
HCT: 33.3 % — ABNORMAL LOW (ref 36.0–46.0)
Hemoglobin: 10.6 g/dL — ABNORMAL LOW (ref 12.0–15.0)
Immature Granulocytes: 0 %
Lymphocytes Relative: 34 %
Lymphs Abs: 2.7 10*3/uL (ref 0.7–4.0)
MCH: 31.3 pg (ref 26.0–34.0)
MCHC: 31.8 g/dL (ref 30.0–36.0)
MCV: 98.2 fL (ref 80.0–100.0)
Monocytes Absolute: 0.6 10*3/uL (ref 0.1–1.0)
Monocytes Relative: 8 %
Neutro Abs: 4.5 10*3/uL (ref 1.7–7.7)
Neutrophils Relative %: 55 %
Platelets: 221 10*3/uL (ref 150–400)
RBC: 3.39 MIL/uL — ABNORMAL LOW (ref 3.87–5.11)
RDW: 15.5 % (ref 11.5–15.5)
WBC: 8 10*3/uL (ref 4.0–10.5)
nRBC: 0 % (ref 0.0–0.2)

## 2019-09-12 LAB — LACTIC ACID, PLASMA: Lactic Acid, Venous: 0.9 mmol/L (ref 0.5–1.9)

## 2019-09-12 LAB — APTT: aPTT: 23 seconds — ABNORMAL LOW (ref 24–36)

## 2019-09-12 MED ORDER — PROMETHAZINE HCL 12.5 MG PO TABS: 25.0000 mg | ORAL_TABLET | Freq: Four times a day (QID) | ORAL | Status: DC | PRN

## 2019-09-12 MED ORDER — PANTOPRAZOLE SODIUM 40 MG PO TBEC
40.0000 mg | DELAYED_RELEASE_TABLET | Freq: Every day | ORAL | Status: DC
  Administered 2019-09-13 – 2019-09-14 (×2): 40 mg via ORAL
  Filled 2019-09-12 (×2): qty 1

## 2019-09-12 MED ORDER — ENOXAPARIN SODIUM 40 MG/0.4ML ~~LOC~~ SOLN
40.0000 mg | Freq: Every day | SUBCUTANEOUS | Status: DC
  Administered 2019-09-13 (×2): 40 mg via SUBCUTANEOUS
  Filled 2019-09-12 (×2): qty 0.4

## 2019-09-12 MED ORDER — ACETAMINOPHEN 325 MG PO TABS: 650.0000 mg | ORAL_TABLET | Freq: Four times a day (QID) | ORAL | Status: DC | PRN

## 2019-09-12 MED ORDER — SODIUM CHLORIDE 0.9 % IV SOLN: INTRAVENOUS | Status: DC

## 2019-09-12 MED ORDER — SODIUM CHLORIDE 0.9 % IV SOLN
1.0000 g | Freq: Once | INTRAVENOUS | Status: AC
  Administered 2019-09-12: 1 g via INTRAVENOUS
  Filled 2019-09-12: qty 10

## 2019-09-12 MED ORDER — PAROXETINE HCL 10 MG PO TABS
30.0000 mg | ORAL_TABLET | Freq: Every morning | ORAL | Status: DC
  Administered 2019-09-13 – 2019-09-14 (×2): 30 mg via ORAL
  Filled 2019-09-12 (×2): qty 3

## 2019-09-12 MED ORDER — IPRATROPIUM-ALBUTEROL 0.5-2.5 (3) MG/3ML IN SOLN: 3.0000 mL | Freq: Four times a day (QID) | RESPIRATORY_TRACT | Status: DC | PRN

## 2019-09-12 MED ORDER — MECLIZINE HCL 12.5 MG PO TABS
12.5000 mg | ORAL_TABLET | Freq: Two times a day (BID) | ORAL | Status: DC
  Administered 2019-09-13 – 2019-09-14 (×4): 12.5 mg via ORAL
  Filled 2019-09-12 (×4): qty 1

## 2019-09-12 MED ORDER — ACETAMINOPHEN 650 MG RE SUPP: 650.0000 mg | Freq: Four times a day (QID) | RECTAL | Status: DC | PRN

## 2019-09-12 MED ORDER — DONEPEZIL HCL 5 MG PO TABS
5.0000 mg | ORAL_TABLET | Freq: Every day | ORAL | Status: DC
  Administered 2019-09-13 (×2): 5 mg via ORAL
  Filled 2019-09-12 (×4): qty 1

## 2019-09-12 MED ORDER — SODIUM CHLORIDE 0.9 % IV BOLUS
1000.0000 mL | Freq: Once | INTRAVENOUS | Status: AC
  Administered 2019-09-12: 1000 mL via INTRAVENOUS

## 2019-09-12 MED ORDER — SODIUM CHLORIDE 0.9 % IV SOLN
1.0000 g | INTRAVENOUS | Status: DC
  Administered 2019-09-13: 1 g via INTRAVENOUS
  Filled 2019-09-12: qty 10

## 2019-09-12 MED ORDER — LORAZEPAM 0.5 MG PO TABS
0.5000 mg | ORAL_TABLET | Freq: Every day | ORAL | Status: DC
  Administered 2019-09-13 (×2): 0.5 mg via ORAL
  Filled 2019-09-12 (×2): qty 1

## 2019-09-12 NOTE — ED Notes (Signed)
hospitalist at bedisde

## 2019-09-12 NOTE — H&P (Signed)
History and Physical    Megan Fisher GYJ:[id number] DOB: [birth year] DOA: 09/12/2019  PCP: Mechele Claude, MD (Confirm with patient/family/NH records and if not entered, this has to be entered at [facility] point of entry) Patient coming from: Home  I have personally briefly reviewed patient's old medical records in [facility]  Chief Complaint: Generalized weakness and confusion  HPI: Megan Fisher is a 86 y.o. female with medical history significant of hypertension, CVA with residual left-sided weakness, anxiety, recurrent UTI, obesity and newly diagnosed dementia is brought to ED for severe generalized weakness and confusion.  [name] is present at the bedside who is helping with the HPI questions.  [name] reported that patient was diagnosed with UTI on Sep 02, 2019 and she was started on antibiotics but her condition did not improve since then and she has become more and more confused.  She stated that PCP started patient recently on dementia medications.  The daughter also reported that patient lives by herself and the caregiver stays with her and she feels that the patient is not getting her medications in time as prescribed and patient is not getting good care.  During my evaluation patient is awake and alert but most of her conversation is not making any sense.  Patient is not able to answer most of the review of system questions appropriately.  According to the daughter patient is more confused and weaker as compared to her baseline.  No fever, nausea, vomiting, abdominal pain and diarrhea reported by patient's daughter.  [name] requested that patient should be evaluated for placement in skilled nursing facility.  ED Course: On arrival to the ED, patient had temperature of 99.5, blood pressure 94/66, heart rate 84, respiratory rate 18 and oxygen saturation 99% on room air.  Blood work showed WBC 8.0, hemoglobin 10.6, sodium 139, potassium 3.5, BUN 10, creatinine  0.67 and blood glucose 93.  UA positive for UTI.  Patient was started on IV ceftriaxone and 1 L bolus of IV normal saline given in the ED.  Blood and urine cultures ordered in the ED.  Review of Systems: Patient was unable to answer most of review of systems questions appropriately because of acute confusion in the setting of dementia. Unacceptable ROS statements: "10 systems reviewed," "Extensive" (without elaboration).  Acceptable ROS statements: "All others negative," "All others reviewed and are negative," and "All others unremarkable," with at LEAST ONE ROS documented Can't double dip - if using for HPI can't use for ROS  Past Medical History:  Diagnosis Date  . Arthritis   . Hernia   . Hypertension   . Obesity   . Sepsis secondary to UTI (HCC) 03/30/2019  . Vertigo     Past Surgical History:  Procedure Laterality Date  . BREAST SURGERY    . CHOLECYSTECTOMY    . HERNIA REPAIR    . ooperectomy    . TONSILLECTOMY       reports that she has never smoked. She has never used smokeless tobacco. She reports that she does not drink alcohol or use drugs.  Allergies  Allergen Reactions  . Propoxyphene Anaphylaxis  . Ciprofloxacin Diarrhea  . Citalopram Other (See Comments)    Reaction is unknown  . Codeine Other (See Comments)    Altered mental status "Makes Crazy"  . Latex   . Levaquin [Levofloxacin In D5w] Diarrhea  . Oxycontin [Oxycodone Hcl] Other (See Comments)    Altered mental status  . Seroquel [Quetiapine] Other (See  Comments)    Hallucinations   . Sulfa Antibiotics Swelling  . Ultram [Tramadol]     AMS  . Zoloft [Sertraline Hcl] Other (See Comments)  . Adhesive [Tape] Itching, Swelling and Rash    Paper tape is ok  . Aspirin Rash  . Penicillins Swelling and Rash    No family history on file. Could not be obtained because of patient's current condition Unacceptable: Noncontributory, unremarkable, or negative. Acceptable: (example)Family history negative for  heart disease  Prior to Admission medications   Medication Sig Start Date End Date Taking? Authorizing Provider  benazepril (LOTENSIN) 10 MG tablet One tab by mouth daily. Needs appointment for future refills. Patient taking differently: Take 10 mg by mouth daily. One tab by mouth daily. Needs appointment for future refills. 08/09/19  Yes Stacks, Cletus Gash, MD  calcium carbonate (OS-CAL - DOSED IN MG OF ELEMENTAL CALCIUM) 1250 (500 Ca) MG tablet Take 1 tablet by mouth 2 (two) times daily with a meal.    Yes [provider]  cholecalciferol (VITAMIN D) 1000 UNITS tablet Take 1,000 Units by mouth daily.   Yes [provider]  Cranberry 450 MG CAPS Take 1 capsule by mouth daily.    Yes [provider]  diclofenac sodium (VOLTAREN) 1 % GEL Apply 2 g topically 4 (four) times daily as needed (for pain).    Yes [provider]  donepezil (ARICEPT) 5 MG tablet Take 1 tablet (5 mg total) by mouth at bedtime. 09/10/19  Yes Stacks, Cletus Gash, MD  furosemide (LASIX) 20 MG tablet Take 1 tablet (20 mg total) by mouth daily. (Needs to be seen before next refill) Patient taking differently: Take 20 mg by mouth daily.  08/27/19  Yes Stacks, Cletus Gash, MD  ipratropium-albuterol (DUONEB) 0.5-2.5 (3) MG/3ML SOLN Inhale 3 mLs into the lungs every 6 (six) hours as needed. 09/18/17  Yes [provider]  loratadine (CLARITIN) 10 MG tablet Take 10 mg by mouth daily.   Yes [provider]  LORazepam (ATIVAN) 0.5 MG tablet Take 1 tablet (0.5 mg total) by mouth at bedtime. Patient taking differently: Take 0.5 mg by mouth at bedtime as needed for sleep.  10/05/18  Yes Claretta Fraise, MD  meclizine (ANTIVERT) 12.5 MG tablet Take 1 tablet (12.5 mg total) by mouth 2 (two) times daily as needed for dizziness. Needs appointment for future refills Patient taking differently: Take 12.5 mg by mouth 2 (two) times daily. Needs appointment for future refills 08/09/19  Yes Stacks, Cletus Gash, MD   omeprazole (PRILOSEC) 20 MG capsule One cap by mouth daily. Needs appointment for future refills. Patient taking differently: Take 20 mg by mouth daily.  08/09/19  Yes Claretta Fraise, MD  OXYGEN Inhale 2 L into the lungs continuous.   Yes [provider]  PARoxetine (PAXIL) 20 MG tablet TAKE 1 TABLET IN MORNING Patient taking differently: Take 30 mg by mouth every morning.  09/21/18  Yes Claretta Fraise, MD  promethazine (PHENERGAN) 25 MG tablet Take 25 mg by mouth every 6 (six) hours as needed for nausea or vomiting.   Yes [provider]  vitamin B-12 (CYANOCOBALAMIN) 1000 MCG tablet Take 1,000 mcg by mouth daily.   Yes [provider]  doxycycline (VIBRAMYCIN) 100 MG capsule Take 1 capsule (100 mg total) by mouth 2 (two) times daily. 09/10/19   Claretta Fraise, MD    Physical Exam: Vitals:   09/12/19 2200 09/12/19 2215 09/12/19 2230 09/12/19 2300  BP: (!) 106/56  114/73 122/75  Pulse: 78  84    Resp: 18 18 19 15   Temp:      TempSrc:      SpO2: 93% 98%    Weight:        Constitutional: NAD, calm, comfortable Vitals:   09/12/19 2200 09/12/19 2215 09/12/19 2230 09/12/19 2300  BP: (!) 106/56  114/73 122/75  Pulse: 78 84    Resp: 18 18 19 15   Temp:      TempSrc:      SpO2: 93% 98%    Weight:       General: 86 year old obese Caucasian female looks acutely confused but no acute distress. Eyes: PERRL, lids and conjunctivae normal ENMT: Mucous membranes are moist. Posterior pharynx clear of any exudate or lesions.Normal dentition.  Neck: normal, supple, no masses, no thyromegaly Respiratory: clear to auscultation bilaterally, no wheezing, no crackles. Normal respiratory effort. No accessory muscle use.  Cardiovascular: Regular rate and rhythm, no murmurs / rubs / gallops. No extremity edema. 2+ pedal pulses. No carotid bruits.  Abdomen: no tenderness, no masses palpated. No hepatosplenomegaly. Bowel sounds positive.  Musculoskeletal: no clubbing / cyanosis.   Contracture of fingers of both hands. Skin: no rashes, lesions, ulcers. No induration Neurologic: Neurological examination could not be done because of patient's current condition but patient has a history of CVA with residual left-sided weakness as reported by patient's daughter. Psychiatric: Patient is more confused than her baseline as per patient's daughter.  Patient is awake and alert but not oriented to time person and place.   (Anything < 9 systems with 2 bullets each down codes to level 1) (If patient refuses exam can't bill higher level) (Make sure to document decubitus ulcers present on admission -- if possible -- and whether patient has chronic indwelling catheter at time of admission)  Labs on Admission: I have personally reviewed following labs and imaging studies  CBC: Recent Labs  Lab 09/10/19 1645 09/12/19 2000  WBC 7.6 8.0  NEUTROABS 3.8 4.5  HGB 10.9* 10.6*  HCT 33.9* 33.3*  MCV 95 98.2  PLT 243 221   Basic Metabolic Panel: Recent Labs  Lab 09/10/19 1645 09/12/19 2000  NA 139 139  K 4.4 3.5  CL 101 102  CO2 29 30  GLUCOSE 92 93  BUN 8 10  CREATININE 0.67 0.67  CALCIUM 8.3* 7.9*   GFR: Estimated Creatinine Clearance: 56.1 mL/min (by C-G formula based on SCr of 0.67 mg/dL). Liver Function Tests: Recent Labs  Lab 09/10/19 1645 09/12/19 2000  AST 25 23  ALT 15 17  ALKPHOS 78 66  BILITOT 0.4 0.7  PROT 5.0* 4.9*  ALBUMIN 2.2* 1.8*   No results for input(s): LIPASE, AMYLASE in the last 168 hours. No results for input(s): AMMONIA in the last 168 hours. Coagulation Profile: Recent Labs  Lab 09/12/19 2000  INR 1.1   Cardiac Enzymes: No results for input(s): CKTOTAL, CKMB, CKMBINDEX, TROPONINI in the last 168 hours. BNP (last 3 results) No results for input(s): PROBNP in the last 8760 hours. HbA1C: No results for input(s): HGBA1C in the last 72 hours. CBG: No results for input(s): GLUCAP in the last 168 hours. Lipid Profile: No results for  input(s): CHOL, HDL, LDLCALC, TRIG, CHOLHDL, LDLDIRECT in the last 72 hours. Thyroid Function Tests: No results for input(s): TSH, T4TOTAL, FREET4, T3FREE, THYROIDAB in the last 72 hours. Anemia Panel: No results for input(s): VITAMINB12, FOLATE, FERRITIN, TIBC, IRON, RETICCTPCT in the last 72 hours. Urine analysis:    Component Value Date/Time  COLORURINE YELLOW 09/12/2019 2018   APPEARANCEUR HAZY (A) 09/12/2019 2018   LABSPEC 1.011 09/12/2019 2018   PHURINE 7.0 09/12/2019 2018   GLUCOSEU NEGATIVE 09/12/2019 2018   HGBUR NEGATIVE 09/12/2019 2018   BILIRUBINUR NEGATIVE 09/12/2019 2018   BILIRUBINUR neg 10/31/2014 1645   KETONESUR NEGATIVE 09/12/2019 2018   PROTEINUR NEGATIVE 09/12/2019 2018   UROBILINOGEN negative 10/31/2014 1645   UROBILINOGEN 0.2 10/06/2014 1920   NITRITE NEGATIVE 09/12/2019 2018   LEUKOCYTESUR MODERATE (A) 09/12/2019 2018    Radiological Exams on Admission: DG Chest Port 1 View  Result Date: 09/12/2019 CLINICAL DATA:  Urinary tract infection, weakness EXAM: PORTABLE CHEST 1 VIEW COMPARISON:  03/30/2019 FINDINGS: Single frontal view of the chest demonstrates a stable cardiac silhouette. No airspace disease, effusion, or pneumothorax. Right chest wall port unchanged. IMPRESSION: 1. Stable exam, no acute process. Electronically Signed   By: Sharlet Salina M.D.   On: 09/12/2019 19:41    EKG: Independently reviewed.   Assessment/Plan Principal Problem:    Urinary tract infection Patient was initially diagnosed with UTI on Sep 02, 2019 and was started on antibiotics but failed outpatient antibiotic therapy and UA is positive for UTI today.  [name] reported that the caregiver is not giving her medications as prescribed. Patient started on IV ceftriaxone. Blood and urine cultures ordered. Tylenol 650 mg every 6 hour as needed for fever.  Active Problems:   Dementia [facility]) Patient is having worsening confusion since she was diagnosed with UTI and PCP  started her on Aricept recently because of concern of new onset dementia.  Acute confusion is most likely from UTI and dehydration. Patient is getting treatment for UTI and IV fluids .    Weakness Generalized weakness is secondary to urinary tract infection and dehydration from poor oral intake.  Patient is given 1 L bolus of normal saline in the ED along with IV ceftriaxone. Physical therapy consult ordered for evaluation, treatment and recommendations Consult to transition care team for SNF placement ordered    Hypotension Blood pressure is 90/50 at this time most likely secondary to dehydration. Patient is given 1 bolus of IV normal saline in the ED. Continue IV normal saline at the rate of 75 mL/h for hypotension   Anxiety/depression Home medications ordered  GERD Protonix 40 mg daily  Shortness of breath DuoNeb every 6 hours as needed and oxygen supplementation with nasal cannula as needed.  Patient uses 2 L of oxygen with nasal cannula at home as needed.  DVT prophylaxis: Lovenox Code Status: DNR Family Communication: [name] present at the bedside Disposition Plan: Still to be decided Consults called: Physical therapy and transition care team Admission status: Observation/MedSurg   Thalia Party MD [facility] Pager [phone]   If 7PM-7AM, please contact night-coverage [email or web address] Password   09/12/2019, 11:55 PM

## 2019-09-12 NOTE — ED Triage Notes (Signed)
Pt from home via [facility]. Pt states she was Dx with UTI on 5/13. PT states she has not gotten any better since that time. Denies pain.

## 2019-09-12 NOTE — ED Provider Notes (Signed)
[facility] EMERGENCY DEPARTMENT Provider Note   CSN: [id number] Arrival date & time: 09/12/19  6503     History Chief Complaint  Patient presents with  . Weakness    Megan Fisher is a 86 y.o. female.  Patient has been treated for urinary tract infection but does not seem to be getting better. The patient is more confused. Patient has a history of a stroke with weakness on the left side. Daughter states that she is not certain the patient's been taking all her antibiotics but she definitely more confused  The history is provided by the patient and medical records. No language interpreter was used.  Weakness Severity:  Moderate Onset quality:  Sudden Timing:  Constant Progression:  Worsening Chronicity:  Recurrent Context: not alcohol use   Relieved by:  Nothing      Past Medical History:  Diagnosis Date  . Arthritis   . Hernia   . Hypertension   . Obesity   . Sepsis secondary to UTI (HCC) 03/30/2019  . Vertigo     Patient Active Problem List   Diagnosis Date Noted  . Late effect of cerebrovascular accident (CVA) 09/10/2019  . Palliative care by specialist   . Goals of care, counseling/discussion   . Weakness   . DNR (do not resuscitate) 03/31/2019  . Depression 03/30/2019  . Dementia (HCC) 03/30/2019  . History of CVA (cerebrovascular accident) 03/30/2019  . Urine frequency 05/10/2013  . Arthritis   . Hypertension   . Vertigo   . Obesity   . Ventral hernia     Past Surgical History:  Procedure Laterality Date  . BREAST SURGERY    . CHOLECYSTECTOMY    . HERNIA REPAIR    . ooperectomy    . TONSILLECTOMY       OB History   No obstetric history on file.     No family history on file.  Social History   Tobacco Use  . Smoking status: Never Smoker  . Smokeless tobacco: Never Used  Substance Use Topics  . Alcohol use: No  . Drug use: No    Home Medications Prior to Admission medications   Medication Sig Start Date End Date Taking?  Authorizing Provider  benazepril (LOTENSIN) 10 MG tablet One tab by mouth daily. Needs appointment for future refills. Patient taking differently: Take 10 mg by mouth daily. One tab by mouth daily. Needs appointment for future refills. 08/09/19  Yes Stacks, Broadus John, MD  calcium carbonate (OS-CAL - DOSED IN MG OF ELEMENTAL CALCIUM) 1250 (500 Ca) MG tablet Take 1 tablet by mouth 2 (two) times daily with a meal.    Yes [provider]  cholecalciferol (VITAMIN D) 1000 UNITS tablet Take 1,000 Units by mouth daily.   Yes [provider]  Cranberry 450 MG CAPS Take 1 capsule by mouth daily.    Yes [provider]  diclofenac sodium (VOLTAREN) 1 % GEL Apply 2 g topically 4 (four) times daily as needed (for pain).    Yes [provider]  donepezil (ARICEPT) 5 MG tablet Take 1 tablet (5 mg total) by mouth at bedtime. 09/10/19  Yes Stacks, Broadus John, MD  furosemide (LASIX) 20 MG tablet Take 1 tablet (20 mg total) by mouth daily. (Needs to be seen before next refill) Patient taking differently: Take 20 mg by mouth daily.  08/27/19  Yes Stacks, Broadus John, MD  ipratropium-albuterol (DUONEB) 0.5-2.5 (3) MG/3ML SOLN Inhale 3 mLs into the lungs every 6 (six) hours as needed. 09/18/17  Yes [provider]  loratadine (CLARITIN) 10 MG tablet Take 10 mg by mouth daily.   Yes [provider]  LORazepam (ATIVAN) 0.5 MG tablet Take 1 tablet (0.5 mg total) by mouth at bedtime. Patient taking differently: Take 0.5 mg by mouth at bedtime as needed for sleep.  10/05/18  Yes Mechele Claude, MD  meclizine (ANTIVERT) 12.5 MG tablet Take 1 tablet (12.5 mg total) by mouth 2 (two) times daily as needed for dizziness. Needs appointment for future refills Patient taking differently: Take 12.5 mg by mouth 2 (two) times daily. Needs appointment for future refills 08/09/19  Yes Stacks, Broadus John, MD  omeprazole (PRILOSEC) 20 MG capsule One cap by mouth daily. Needs appointment for future  refills. Patient taking differently: Take 20 mg by mouth daily.  08/09/19  Yes Mechele Claude, MD  OXYGEN Inhale 2 L into the lungs continuous.   Yes [provider]  PARoxetine (PAXIL) 20 MG tablet TAKE 1 TABLET IN MORNING Patient taking differently: Take 30 mg by mouth every morning.  09/21/18  Yes Mechele Claude, MD  promethazine (PHENERGAN) 25 MG tablet Take 25 mg by mouth every 6 (six) hours as needed for nausea or vomiting.   Yes [provider]  vitamin B-12 (CYANOCOBALAMIN) 1000 MCG tablet Take 1,000 mcg by mouth daily.   Yes [provider]  doxycycline (VIBRAMYCIN) 100 MG capsule Take 1 capsule (100 mg total) by mouth 2 (two) times daily. 09/10/19   Mechele Claude, MD    Allergies    Propoxyphene, Ciprofloxacin, Citalopram, Codeine, Latex, Levaquin [levofloxacin in d5w], Oxycontin [oxycodone hcl], Seroquel [quetiapine], Sulfa antibiotics, Ultram [tramadol], Zoloft [sertraline hcl], Adhesive [tape], Aspirin, and Penicillins  Review of Systems   Review of Systems  Unable to perform ROS: Dementia  Neurological: Positive for weakness.    Physical Exam Updated Vital Signs BP (!) 106/56   Pulse 78   Temp 99.5 F (37.5 C) (Oral)   Resp 18   Wt 101 kg   SpO2 93%   BMI 37.05 kg/m   Physical Exam Vitals and nursing note reviewed.  Constitutional:      Appearance: She is well-developed.  HENT:     Head: Normocephalic.     Nose: Nose normal.  Eyes:     General: No scleral icterus.    Conjunctiva/sclera: Conjunctivae normal.  Neck:     Thyroid: No thyromegaly.  Cardiovascular:     Rate and Rhythm: Normal rate and regular rhythm.     Heart sounds: No murmur. No friction rub. No gallop.   Pulmonary:     Breath sounds: No stridor. No wheezing or rales.  Chest:     Chest wall: No tenderness.  Abdominal:     General: There is no distension.     Tenderness: There is no abdominal tenderness. There is no rebound.  Musculoskeletal:        General:  Normal range of motion.     Cervical back: Neck supple.  Lymphadenopathy:     Cervical: No cervical adenopathy.  Skin:    Findings: No erythema or rash.  Neurological:     Mental Status: She is alert.     Motor: No abnormal muscle tone.     Coordination: Coordination normal.     Comments: Patient is oriented to person only  Psychiatric:        Behavior: Behavior normal.     ED Results / Procedures / Treatments   Labs (all labs ordered are listed, but only abnormal  results are displayed) Labs Reviewed  COMPREHENSIVE METABOLIC PANEL - Abnormal; Notable for the following components:      Result Value   Calcium 7.9 (*)    Total Protein 4.9 (*)    Albumin 1.8 (*)    All other components within normal limits  CBC WITH DIFFERENTIAL/PLATELET - Abnormal; Notable for the following components:   RBC 3.39 (*)    Hemoglobin 10.6 (*)    HCT 33.3 (*)    All other components within normal limits  APTT - Abnormal; Notable for the following components:   aPTT 23 (*)    All other components within normal limits  URINALYSIS, ROUTINE W REFLEX MICROSCOPIC - Abnormal; Notable for the following components:   APPearance HAZY (*)    Leukocytes,Ua MODERATE (*)    WBC, UA >50 (*)    Bacteria, UA RARE (*)    All other components within normal limits  CULTURE, BLOOD (ROUTINE X 2)  CULTURE, BLOOD (ROUTINE X 2)  URINE CULTURE  LACTIC ACID, PLASMA  PROTIME-INR    EKG None  Radiology DG Chest Port 1 View  Result Date: 09/12/2019 CLINICAL DATA:  Urinary tract infection, weakness EXAM: PORTABLE CHEST 1 VIEW COMPARISON:  03/30/2019 FINDINGS: Single frontal view of the chest demonstrates a stable cardiac silhouette. No airspace disease, effusion, or pneumothorax. Right chest wall port unchanged. IMPRESSION: 1. Stable exam, no acute process. Electronically Signed   By: Randa Ngo M.D.   On: 09/12/2019 19:41    Procedures Procedures (including critical care time)  Medications Ordered in  ED Medications  sodium chloride 0.9 % bolus 1,000 mL (1,000 mLs Intravenous New Bag/Given 09/12/19 2010)  cefTRIAXone (ROCEPHIN) 1 g in sodium chloride 0.9 % 100 mL IVPB (0 g Intravenous Stopped 09/12/19 2103)    ED Course  I have reviewed the triage vital signs and the nursing notes.  Pertinent labs & imaging results that were available during my care of the patient were reviewed by me and considered in my medical decision making (see chart for details).    MDM Rules/Calculators/A&P                      Patient with recurrent UTI dementia and confusion. She'll be admitted to medicine       This patient presents to the ED for concern of altered mental status this involves an extensive number of treatment options, and is a complaint that carries with it a high risk of complications and morbidity.  The differential diagnosis includes UTI metabolic problem   Lab Tests:   I Ordered, reviewed, and interpreted labs, which included CBC chemistries and urinalysis.  Patient has anemia and UTI  Medicines ordered:   I ordered medication antibiotics for UTI  Imaging Studies ordered:   Additional history obtained:   Additional history obtained from daughter  Previous records obtained and reviewed   Consultations Obtained:   I consulted hospitalist and discussed lab and imaging findings  Reevaluation:  After the interventions stated above, I reevaluated the patient and found no improvement  Critical Interventions:  .   Final Clinical Impression(s) / ED Diagnoses Final diagnoses:  Acute cystitis without hematuria    Rx / DC Orders ED Discharge Orders    None       Milton Ferguson, MD 09/13/19 1018

## 2019-09-13 DIAGNOSIS — Z79899 Other long term (current) drug therapy: Secondary | ICD-10-CM | POA: Diagnosis not present

## 2019-09-13 DIAGNOSIS — E669 Obesity, unspecified: Secondary | ICD-10-CM | POA: Diagnosis present

## 2019-09-13 DIAGNOSIS — Z9981 Dependence on supplemental oxygen: Secondary | ICD-10-CM | POA: Diagnosis not present

## 2019-09-13 DIAGNOSIS — R4182 Altered mental status, unspecified: Secondary | ICD-10-CM | POA: Diagnosis not present

## 2019-09-13 DIAGNOSIS — E860 Dehydration: Secondary | ICD-10-CM | POA: Diagnosis present

## 2019-09-13 DIAGNOSIS — Z87440 Personal history of urinary (tract) infections: Secondary | ICD-10-CM | POA: Diagnosis not present

## 2019-09-13 DIAGNOSIS — Z6837 Body mass index (BMI) 37.0-37.9, adult: Secondary | ICD-10-CM | POA: Diagnosis not present

## 2019-09-13 DIAGNOSIS — I959 Hypotension, unspecified: Secondary | ICD-10-CM | POA: Diagnosis not present

## 2019-09-13 DIAGNOSIS — Z20822 Contact with and (suspected) exposure to covid-19: Secondary | ICD-10-CM | POA: Diagnosis present

## 2019-09-13 DIAGNOSIS — I10 Essential (primary) hypertension: Secondary | ICD-10-CM | POA: Diagnosis present

## 2019-09-13 DIAGNOSIS — N390 Urinary tract infection, site not specified: Secondary | ICD-10-CM | POA: Diagnosis not present

## 2019-09-13 DIAGNOSIS — F419 Anxiety disorder, unspecified: Secondary | ICD-10-CM | POA: Diagnosis present

## 2019-09-13 DIAGNOSIS — K219 Gastro-esophageal reflux disease without esophagitis: Secondary | ICD-10-CM | POA: Diagnosis present

## 2019-09-13 DIAGNOSIS — F0390 Unspecified dementia without behavioral disturbance: Secondary | ICD-10-CM | POA: Diagnosis present

## 2019-09-13 DIAGNOSIS — G9340 Encephalopathy, unspecified: Secondary | ICD-10-CM | POA: Diagnosis present

## 2019-09-13 DIAGNOSIS — Z66 Do not resuscitate: Secondary | ICD-10-CM | POA: Diagnosis present

## 2019-09-13 DIAGNOSIS — Z7951 Long term (current) use of inhaled steroids: Secondary | ICD-10-CM | POA: Diagnosis not present

## 2019-09-13 DIAGNOSIS — G9341 Metabolic encephalopathy: Secondary | ICD-10-CM | POA: Diagnosis present

## 2019-09-13 DIAGNOSIS — N3000 Acute cystitis without hematuria: Secondary | ICD-10-CM | POA: Diagnosis present

## 2019-09-13 DIAGNOSIS — F329 Major depressive disorder, single episode, unspecified: Secondary | ICD-10-CM | POA: Diagnosis present

## 2019-09-13 DIAGNOSIS — B952 Enterococcus as the cause of diseases classified elsewhere: Secondary | ICD-10-CM | POA: Diagnosis present

## 2019-09-13 DIAGNOSIS — I69354 Hemiplegia and hemiparesis following cerebral infarction affecting left non-dominant side: Secondary | ICD-10-CM | POA: Diagnosis not present

## 2019-09-13 DIAGNOSIS — Z7401 Bed confinement status: Secondary | ICD-10-CM | POA: Diagnosis not present

## 2019-09-13 LAB — COMPREHENSIVE METABOLIC PANEL
ALT: 16 U/L (ref 0–44)
AST: 23 U/L (ref 15–41)
Albumin: 2 g/dL — ABNORMAL LOW (ref 3.5–5.0)
Alkaline Phosphatase: 69 U/L (ref 38–126)
Anion gap: 8 (ref 5–15)
BUN: 11 mg/dL (ref 8–23)
CO2: 30 mmol/L (ref 22–32)
Calcium: 8 mg/dL — ABNORMAL LOW (ref 8.9–10.3)
Chloride: 100 mmol/L (ref 98–111)
Creatinine, Ser: 0.73 mg/dL (ref 0.44–1.00)
GFR calc Af Amer: >60 mL/min (ref >60)
GFR calc non Af Amer: >60 mL/min (ref >60)
Glucose, Bld: 89 mg/dL (ref 70–99)
Potassium: 3.2 mmol/L — ABNORMAL LOW (ref 3.5–5.1)
Sodium: 138 mmol/L (ref 135–145)
Total Bilirubin: 0.8 mg/dL (ref 0.3–1.2)
Total Protein: 5.2 g/dL — ABNORMAL LOW (ref 6.5–8.1)

## 2019-09-13 LAB — CBC
HCT: 35.7 % — ABNORMAL LOW (ref 36.0–46.0)
Hemoglobin: 11.5 g/dL — ABNORMAL LOW (ref 12.0–15.0)
MCH: 31.9 pg (ref 26.0–34.0)
MCHC: 32.2 g/dL (ref 30.0–36.0)
MCV: 98.9 fL (ref 80.0–100.0)
Platelets: 261 10*3/uL (ref 150–400)
RBC: 3.61 MIL/uL — ABNORMAL LOW (ref 3.87–5.11)
RDW: 15.7 % — ABNORMAL HIGH (ref 11.5–15.5)
WBC: 8.1 10*3/uL (ref 4.0–10.5)
nRBC: 0 % (ref 0.0–0.2)

## 2019-09-13 LAB — SARS CORONAVIRUS 2 BY RT PCR (HOSPITAL ORDER, PERFORMED IN ~~LOC~~ HOSPITAL LAB): SARS Coronavirus 2: NEGATIVE

## 2019-09-13 MED ORDER — POTASSIUM CHLORIDE CRYS ER 20 MEQ PO TBCR
40.0000 meq | EXTENDED_RELEASE_TABLET | Freq: Once | ORAL | Status: AC
  Administered 2019-09-13: 40 meq via ORAL
  Filled 2019-09-13: qty 2

## 2019-09-13 NOTE — Evaluation (Signed)
Physical Therapy Evaluation Patient Details Name: Megan Fisher MRN: [id number] DOB: 1929-04-21 Today's Date: 09/13/2019   History of Present Illness  Megan Fisher is a 86 y.o. female with medical history significant of hypertension, CVA with residual left-sided weakness, anxiety, recurrent UTI, obesity and newly diagnosed dementia is brought to ED for severe generalized weakness and confusion.  [name] is present at the bedside who is helping with the HPI questions.  [name] reported that patient was diagnosed with UTI on Sep 02, 2019 and she was started on antibiotics but her condition did not improve since then and she has become more and more confused.  She stated that PCP started patient recently on dementia medications.  The daughter also reported that patient lives by herself and the caregiver stays with her and she feels that the patient is not getting her medications in time as prescribed and patient is not getting good care.  During my evaluation patient is awake and alert but most of her conversation is not making any sense.  Patient is not able to answer most of the review of system questions appropriately.  According to the daughter patient is more confused and weaker as compared to her baseline.  No fever, nausea, vomiting, abdominal pain and diarrhea reported by patient's daughter.  [name] requested that patient should be evaluated for placement in skilled nursing facility.    Clinical Impression  Patient functioning at baseline for functional mobility which is total assist for transfers with use of mechanical lift at home, non-ambulatory.  Plan:  Patient discharged from physical therapy to care of nursing for for out of as tolerated with use of mechanical lift for length of stay.     Follow Up Recommendations No PT follow up;Supervision for mobility/OOB;Supervision - Intermittent    Equipment Recommendations  None recommended by PT    Recommendations  for Other Services       Precautions / Restrictions Precautions Precautions: Fall Restrictions Weight Bearing Restrictions: No      Mobility  Bed Mobility Overal bed mobility: Needs Assistance Bed Mobility: Supine to Sit;Sit to Supine     Supine to sit: Max assist Sit to supine: Max assist   General bed mobility comments: unable to maintain sitting balance, frequent falling backwards  Transfers                    Ambulation/Gait                Stairs            Wheelchair Mobility    Modified Rankin (Stroke Patients Only)       Balance Overall balance assessment: Needs assistance Sitting-balance support: Feet unsupported;Single extremity supported Sitting balance-Leahy Scale: Poor Sitting balance - Comments: seated at EOB, frequent falling backwards Postural control: Posterior lean                                   Pertinent Vitals/Pain Pain Assessment: No/denies pain    Home Living Family/patient expects to be discharged to:: Private residence Living Arrangements: Children Available Help at Discharge: Family;Personal care attendant;Available 24 hours/day Type of Home: House         Home Equipment: Wheelchair - manual;Hospital bed Additional Comments: uses lift for transfers at home    Prior Function Level of Independence: Needs assistance   Gait / Transfers Assistance Needed: non ambulatory, uses lift for transfers,  mostly bed bound  ADL's / Homemaking Assistance Needed: assisted by family and aides 24/7, max to total care        Hand Dominance   Dominant Hand: Right    Extremity/Trunk Assessment   Upper Extremity Assessment Upper Extremity Assessment: Generalized weakness;RUE deficits/detail;LUE deficits/detail RUE Deficits / Details: grossly -3/5, has difficulty opening hand RUE Sensation: WNL RUE Coordination: decreased fine motor LUE Deficits / Details: grossly 0/5, contractures LUE: Unable to fully  assess due to immobilization    Lower Extremity Assessment Lower Extremity Assessment: Generalized weakness;RLE deficits/detail;LLE deficits/detail RLE Deficits / Details: grossly -3/5 RLE Sensation: WNL RLE Coordination: WNL LLE Deficits / Details: grossly -2/5 LLE: Unable to fully assess due to immobilization LLE Coordination: decreased fine motor    Cervical / Trunk Assessment Cervical / Trunk Assessment: Kyphotic  Communication   Communication: No difficulties  Cognition Arousal/Alertness: Awake/alert Behavior During Therapy: WFL for tasks assessed/performed Overall Cognitive Status: History of cognitive impairments - at baseline                                 General Comments: Patient appears confused, but able to follow directions and states she was bed bound at home and non-ambulatory      General Comments      Exercises     Assessment/Plan    PT Assessment Patent does not need any further PT services  PT Problem List         PT Treatment Interventions      PT Goals (Current goals can be found in the Care Plan section)  Acute Rehab PT Goals Patient Stated Goal: return home with family and caregivers to assist PT Goal Formulation: With patient Time For Goal Achievement: 09/13/19 Potential to Achieve Goals: Good    Frequency     Barriers to discharge        Co-evaluation               AM-PAC PT "6 Clicks" Mobility  Outcome Measure Help needed turning from your back to your side while in a flat bed without using bedrails?: A Lot Help needed moving from lying on your back to sitting on the side of a flat bed without using bedrails?: A Lot Help needed moving to and from a bed to a chair (including a wheelchair)?: Total Help needed standing up from a chair using your arms (e.g., wheelchair or bedside chair)?: Total Help needed to walk in hospital room?: Total Help needed climbing 3-5 steps with a railing? : Total 6 Click Score:  8    End of Session   Activity Tolerance: Patient tolerated treatment well;Patient limited by fatigue Patient left: in bed;with call bell/phone within reach Nurse Communication: Mobility status PT Visit Diagnosis: Unsteadiness on feet (R26.81);Muscle weakness (generalized) (M62.81);Hemiplegia and hemiparesis Hemiplegia - Right/Left: Left Hemiplegia - dominant/non-dominant: Non-dominant Hemiplegia - caused by: Cerebral infarction    Time: 5916-3846 PT Time Calculation (min) (ACUTE ONLY): 22 min   Charges:   PT Evaluation $PT Eval Moderate Complexity: 1 Mod PT Treatments $Therapeutic Activity: 8-22 mins        2:13 PM, 09/13/19 Lonell Grandchild, MPT Physical Therapist with [facility] [phone] office [phone] mobile phone

## 2019-09-13 NOTE — [place] FL2 (Signed)
[place] MEDICAID FL2 LEVEL OF CARE SCREENING TOOL     IDENTIFICATION  Patient Name: Megan Fisher Birthdate: [birth year] Sex: female Admission Date (Current Location): 09/12/2019  [facility] and [place] Number:  [employer] and Address:  [facility],  [address], [place] [address]      Provider Number: [id number]  Attending Physician Name and Address:  Erick Blinks, DO  Relative Name and Phone Number:  [name] (daughter) [facility]: [phone]    Current Level of Care: Hospital Recommended Level of Care: Nursing Facility Prior Approval Number:    Date Approved/Denied:   PASRR Number: [id number]  Discharge Plan: Other (Comment)(Long-term nursing facility)    Current Diagnoses: Patient Active Problem List   Diagnosis Date Noted  . Acute encephalopathy 09/13/2019  . Urinary tract infection 09/12/2019  . Hypotension 09/12/2019  . Late effect of cerebrovascular accident (CVA) 09/10/2019  . Palliative care by specialist   . Goals of care, counseling/discussion   . Weakness   . DNR (do not resuscitate) 03/31/2019  . Depression 03/30/2019  . Dementia (HCC) 03/30/2019  . History of CVA (cerebrovascular accident) 03/30/2019  . Urine frequency 05/10/2013  . Arthritis   . Hypertension   . Vertigo   . Obesity   . Ventral hernia     Orientation RESPIRATION BLADDER Height & Weight     Self, Situation  Normal Incontinent, External catheter Weight: 217 lb 13 oz (98.8 kg) Height:  5\' 5"  (165.1 cm)(patient stated 5\' 5" )  BEHAVIORAL SYMPTOMS/MOOD NEUROLOGICAL BOWEL NUTRITION STATUS      Incontinent Diet(Heart healthy)  AMBULATORY STATUS COMMUNICATION OF NEEDS Skin   Total Care Verbally Skin abrasions(Sacrum)                       Personal Care Assistance Level of Assistance  Bathing, Feeding, Dressing, Total care Bathing Assistance: Maximum assistance Feeding assistance: Maximum assistance Dressing Assistance: Maximum assistance Total Care  Assistance: Maximum assistance   Functional Limitations Info  Sight, Hearing, Speech Sight Info: Adequate(Has cataracts in both eyes) Hearing Info: Impaired(Difficulty hearing and will not wear hearing aids as they hurt her head) Speech Info: Adequate    SPECIAL CARE FACTORS FREQUENCY                       Contractures Contractures Info: Present(Right and left fingers)    Additional Factors Info  Code Status, Allergies, Psychotropic Code Status Info: DNR Allergies Info: Propoxyphene; Ciprofloxacin; Citalopram; Codeine; Latex; Levaquin (Levofloxacin In D5w); Oxycontin (Oxycodone Hcl); Seroquel (Quetiapine); Sulfa Antibiotics; Ultram (Tramadol); Zoloft (Sertraline Hcl); Adhesive (Tape); Aspirin; Penicillins Psychotropic Info: Ativan (lorazepam); Paxil (paroxetine)         Current Medications (09/13/2019):  This is the current hospital active medication list Current Facility-Administered Medications  Medication Dose Route Frequency Provider Last Rate Last Admin  . acetaminophen (TYLENOL) tablet 650 mg  650 mg Oral Q6H PRN Z, DO       Or  . acetaminophen (TYLENOL) suppository 650 mg  650 mg Rectal Q6H PRN 09/15/2019 Z, DO      . cefTRIAXone (ROCEPHIN) 1 g in sodium chloride 0.9 % 100 mL IVPB  1 g Intravenous Q24H Renda Rolls Z, DO      . donepezil (ARICEPT) tablet 5 mg  5 mg Oral QHS Renda Rolls Z, DO   5 mg at 09/13/19 0037  . enoxaparin (LOVENOX) injection 40 mg  40 mg Subcutaneous QHS Renda Rolls  Z, DO   40 mg at 09/13/19 0035  . ipratropium-albuterol (DUONEB) 0.5-2.5 (3) MG/3ML nebulizer solution 3 mL  3 mL Inhalation Q6H PRN Bunnie Pion Z, DO      . LORazepam (ATIVAN) tablet 0.5 mg  0.5 mg Oral QHS Bunnie Pion Z, DO   0.5 mg at 09/13/19 0035  . meclizine (ANTIVERT) tablet 12.5 mg  12.5 mg Oral BID Bunnie Pion Z, DO   12.5 mg at 09/13/19 1035  . pantoprazole (PROTONIX) EC tablet 40 mg  40 mg Oral Daily Bunnie Pion Z, DO   40 mg at  09/13/19 1036  . PARoxetine (PAXIL) tablet 30 mg  30 mg Oral q morning - 10a Bunnie Pion Z, DO   30 mg at 09/13/19 1036  . promethazine (PHENERGAN) tablet 25 mg  25 mg Oral Q6H PRN Edmonia Lynch, DO         Discharge Medications: Please see discharge summary for a list of discharge medications.  Relevant Imaging Results:  Relevant Lab Results:   Additional Information SSN#: [id number]  Sherie Don, LCSW

## 2019-09-13 NOTE — TOC Initial Note (Signed)
Transition of Care [facility]) - Initial/Assessment Note   Patient Details  Name: Megan Fisher MRN: [id number] Date of Birth: 09-02-1928  Transition of [facility]) CM/SW Contact:    [name], LCSW Phone Number: 09/13/2019, 8:46 PM  Clinical Narrative: Patient is an 86 year old female who was admitted to the hospital for a UTI. Family wants the patient to return to [facility] as she was a LTC resident there for 3.5 years prior to COVID and had Medicaid at that time. Patient is currently active with [facility] [phone]) and has a hospital bed, wheelchair, and lift through hospice. Per family, hospice was assisting the family with placement at [facility]. The family is in the process of applying for Medicaid, so the patient would be private pay until Medicaid is approved.  CSW attempted to call Thayer Ohm with [facility] to see if patient can return as a LTC resident and with hospice services, but was unable to reach him. CSW left voicemail requesting call back. CSW spoke with patient's daughter, [name], to get collateral information for the FL2. FL2 completed and awaiting co-sign.  Expected Discharge Plan: Long Term Nursing Home(Family is requesting [facility]) Barriers to Discharge: Other (comment)(Awaiting to hear if [facility] will take patient as LTC patient)  Patient Goals and CMS Choice Patient states their goals for this hospitalization and ongoing recovery are:: Discharge to [facility] for long-term care facility CMS Medicare.gov Compare Post Acute Care list provided to:: Patient Represenative (must comment)([name] (daughter)) Choice offered to / list presented to : Adult Children([name] (daughter))  Expected Discharge Plan and Services Expected Discharge Plan: Long Term Nursing Home(Family is requesting [facility]) Post Acute Care Choice: Nursing Home([facility]) Living arrangements for the past 2 months: Single Family Home  Prior  Living Arrangements/Services Living arrangements for the past 2 months: Single Family Home Lives with:: Adult Children Patient language and need for interpreter reviewed:: Yes Do you feel safe going back to the place where you live?: Yes(Patient is safe in the home, but the family feels she needs full-time care and supervision in a LTC facility)      Need for Family Participation in Patient Care: Yes (Comment)(Patient has dementia) Care giver support system in place?: Yes (comment)([name] (daughter) PH: [phone]) Current home services: DME, Hospice(Wheelchair, hospital bed, lift through hospice; Hospice is [facility] [phone]) Criminal Activity/Legal Involvement Pertinent to Current Situation/Hospitalization: No - Comment as needed  Activities of Daily Living Home Assistive Devices/Equipment: None ADL Screening (condition at time of admission) Patient's cognitive ability adequate to safely complete daily activities?: Yes Is the patient deaf or have difficulty hearing?: No Does the patient have difficulty seeing, even when wearing glasses/contacts?: No Does the patient have difficulty concentrating, remembering, or making decisions?: Yes Patient able to express need for assistance with ADLs?: Yes Does the patient have difficulty dressing or bathing?: Yes Independently performs ADLs?: No Communication: Independent Dressing (OT): Dependent Is this a change from baseline?: Pre-admission baseline Grooming: Dependent Is this a change from baseline?: Pre-admission baseline Feeding: Dependent Is this a change from baseline?: Pre-admission baseline Bathing: Dependent Is this a change from baseline?: Pre-admission baseline Toileting: Dependent Is this a change from baseline?: Pre-admission baseline In/Out Bed: Dependent Is this a change from baseline?: Pre-admission baseline Walks in Home: Dependent Is this a change from baseline?: Pre-admission baseline Does  the patient have difficulty walking or climbing stairs?: Yes Weakness of Legs: Both Weakness of  Arms/Hands: Both  Emotional Assessment Appearance:: Appears stated age Attitude/Demeanor/Rapport: Unable to Assess Affect (typically observed): Unable to Assess Orientation: : Oriented to Self Alcohol / Substance Use: Not Applicable Psych Involvement: No (comment)  Admission diagnosis:  Urinary tract infection [N39.0] Acute cystitis without hematuria [N30.00] Acute encephalopathy [G93.40] Patient Active Problem List   Diagnosis Date Noted  . Acute encephalopathy 09/13/2019  . Urinary tract infection 09/12/2019  . Hypotension 09/12/2019  . Late effect of cerebrovascular accident (CVA) 09/10/2019  . Palliative care by specialist   . Goals of care, counseling/discussion   . Weakness   . DNR (do not resuscitate) 03/31/2019  . Depression 03/30/2019  . Dementia ([place]) 03/30/2019  . History of CVA (cerebrovascular accident) 03/30/2019  . Urine frequency 05/10/2013  . Arthritis   . Hypertension   . Vertigo   . Obesity   . Ventral hernia    PCP:  Claretta Fraise, MD Pharmacy:   [place], [place] [address] Phone: [phone] Fax: [phone]  Readmission Risk Interventions No flowsheet data found.

## 2019-09-13 NOTE — Progress Notes (Signed)
Per HPI: Megan Fisher is a 86 y.o. female with medical history significant of hypertension, CVA with residual left-sided weakness, anxiety, recurrent UTI, obesity and newly diagnosed dementia is brought to ED for severe generalized weakness and confusion.  [name] is present at the bedside who is helping with the HPI questions.  [name] reported that patient was diagnosed with UTI on Sep 02, 2019 and she was started on antibiotics but her condition did not improve since then and she has become more and more confused.  She stated that PCP started patient recently on dementia medications.  The daughter also reported that patient lives by herself and the caregiver stays with her and she feels that the patient is not getting her medications in time as prescribed and patient is not getting good care.  During my evaluation patient is awake and alert but most of her conversation is not making any sense.  Patient is not able to answer most of the review of system questions appropriately.  According to the daughter patient is more confused and weaker as compared to her baseline.  No fever, nausea, vomiting, abdominal pain and diarrhea reported by patient's daughter.  [name] requested that patient should be evaluated for placement in skilled nursing facility.  -Patient was admitted with worsening generalized weakness with poor oral intake in the setting of dementia and prior CVA.  She is also noted to have UTI and started on Rocephin empirically with cultures pending.  Blood cultures with no growth noted thus far.  Plan to start diet today and follow labs.  She is noted to have mild hypokalemia which will be repleted.  PT evaluation pending and will likely need SNF placement.  I have tried calling daughter with no response.  Total care time: 25 minutes.

## 2019-09-14 DIAGNOSIS — F0390 Unspecified dementia without behavioral disturbance: Secondary | ICD-10-CM

## 2019-09-14 LAB — BASIC METABOLIC PANEL
Anion gap: 8 (ref 5–15)
BUN: 11 mg/dL (ref 8–23)
CO2: 31 mmol/L (ref 22–32)
Calcium: 8.2 mg/dL — ABNORMAL LOW (ref 8.9–10.3)
Chloride: 102 mmol/L (ref 98–111)
Creatinine, Ser: 0.61 mg/dL (ref 0.44–1.00)
GFR calc Af Amer: >60 mL/min (ref >60)
GFR calc non Af Amer: >60 mL/min (ref >60)
Glucose, Bld: 87 mg/dL (ref 70–99)
Potassium: 3.8 mmol/L (ref 3.5–5.1)
Sodium: 141 mmol/L (ref 135–145)

## 2019-09-14 LAB — CBC
HCT: 34.6 % — ABNORMAL LOW (ref 36.0–46.0)
Hemoglobin: 10.8 g/dL — ABNORMAL LOW (ref 12.0–15.0)
MCH: 30.9 pg (ref 26.0–34.0)
MCHC: 31.2 g/dL (ref 30.0–36.0)
MCV: 98.9 fL (ref 80.0–100.0)
Platelets: 217 10*3/uL (ref 150–400)
RBC: 3.5 MIL/uL — ABNORMAL LOW (ref 3.87–5.11)
RDW: 15.5 % (ref 11.5–15.5)
WBC: 7.7 10*3/uL (ref 4.0–10.5)
nRBC: 0 % (ref 0.0–0.2)

## 2019-09-14 LAB — MAGNESIUM: Magnesium: 1.8 mg/dL (ref 1.7–2.4)

## 2019-09-14 MED ORDER — CHLORHEXIDINE GLUCONATE CLOTH 2 % EX PADS
6.0000 | MEDICATED_PAD | Freq: Every day | CUTANEOUS | Status: DC
  Administered 2019-09-14: 6 via TOPICAL

## 2019-09-14 MED ORDER — LOPERAMIDE HCL 2 MG PO TABS
2.0000 mg | ORAL_TABLET | Freq: Four times a day (QID) | ORAL | 0 refills | Status: DC | PRN
Start: 2019-09-14 — End: 2019-10-05

## 2019-09-14 MED ORDER — FOSFOMYCIN TROMETHAMINE 3 G PO PACK
3.0000 g | PACK | Freq: Once | ORAL | Status: AC
  Administered 2019-09-14: 3 g via ORAL
  Filled 2019-09-14: qty 3

## 2019-09-14 MED ORDER — HEPARIN SOD (PORK) LOCK FLUSH 100 UNIT/ML IV SOLN
10.0000 [IU] | Freq: Once | INTRAVENOUS | Status: AC
  Administered 2019-09-14: 10 [IU] via INTRAVENOUS
  Filled 2019-09-14: qty 5

## 2019-09-14 MED ORDER — NITROFURANTOIN MACROCRYSTAL 50 MG PO CAPS
50.0000 mg | ORAL_CAPSULE | Freq: Every day | ORAL | 3 refills | Status: DC
Start: 2019-09-14 — End: 2019-09-19

## 2019-09-14 NOTE — TOC Progression Note (Signed)
Transition of Care [facility]) - Progression Note    Patient Details  Name: KENDAHL BUMGARDNER MRN: [id number] Date of Birth: December 22, 1928  [facility]) CM/SW Contact  Karn Cassis, [place] Phone Number: 09/14/2019, 11:16 AM  Clinical Narrative:   LCSW received call from son, Arlys John Arlys John and sister state he is HCPOA). Arlys John reports that they have decided to take pt home. They thought their caregiver was going to be leaving, but have found out she is going to stay. Arlys John plans to take over administering medications as this has been a concern with caregiver. He is going to call  [facility] to resume services upon return home. LCSW notified MD and Albertina Parr. Arlys John states no other equipment needs at home. Will follow.     Expected Discharge Plan: Long Term Nursing Home(Family is requesting [facility]) Barriers to Discharge: Other (comment)(Awaiting to hear if [facility] will take patient as LTC patient)  Expected Discharge Plan and Services Expected Discharge Plan: Long Term Nursing Home(Family is requesting [facility])     Post Acute Care Choice: Nursing Home([facility]) Living arrangements for the past 2 months: Single Family Home                                       Social Determinants of Health (SDOH) Interventions    Readmission Risk Interventions No flowsheet data found.

## 2019-09-14 NOTE — Discharge Summary (Signed)
Physician Discharge Summary  Megan Fisher UXL:[id number] DOB: Jul 18, 1928 DOA: 09/12/2019  PCP: Megan Fraise, MD  Admit date: 09/12/2019  Discharge date: 09/14/2019  Admitted From:Home with hospice  Disposition:  Home with hospice  Recommendations for Outpatient Follow-up:  1. Follow up with PCP in 1-2 weeks 2. Patient given fosfomycin prior to discharge and noted to have Enterococcus UTI 3. She has frequent recurrences of UTI and will remain on nitrofurantoin as prescribed for prophylaxis 4. Imodium given as needed for diarrhea 5. Continue remainder of home medications as prescribed  Home Health: Has home hospice care and 24/7 caregiver  Equipment/Devices: None  Discharge Condition: Stable  CODE STATUS: DNR  Diet recommendation: Heart Healthy  Brief/Interim Summary: Per HPI: Megan Fisher a 86 y.o.femalewith medical history significant ofhypertension, CVA with residual left-sided weakness, anxiety, recurrent UTI, obesity and newly diagnosed dementia is brought to ED for severe generalized weakness and confusion. [name] is present at the bedside who is helping with the HPI questions. [name] reported that patient was diagnosed with UTI on Sep 02, 2019 and she was started on antibiotics but her condition did not improve since then and she has become more and more confused. She stated that PCP started patient recently on dementia medications. The daughter also reported that patient lives by herself and the caregiver stays with her and she feels that the patient is not getting her medications in time as prescribed and patient is not getting good care. During my evaluation patient is awake and alert but most of her conversation is not making any sense. Patient is not able to answer most of the review of system questions appropriately. According to the daughter patient is more confused and weaker as compared to her baseline. No fever, nausea, vomiting,  abdominal pain and diarrhea reported by patient's daughter. [name] requested that patient should be evaluated for placement in skilled nursing facility.  -Patient was admitted with worsening generalized weakness with poor oral intake in the setting of dementia and prior CVA.  She was noted to have urinary tract infection and was started empirically on Rocephin and ultimately had urine cultures resulted with Enterococcus.  She will be given 1 dose of fosfomycin prior to discharge.  I had spoken with the patient's son who states that she has frequent recurrences of UTI for which I will prescribe nitrofurantoin for prophylaxis.  She continues to remain somewhat pleasantly confused, but I believe this is due to her dementia.  She is appropriate for home hospice and does have caregiver available to her 24/7.  No other acute events noted throughout the course of this admission.  Discharge Diagnoses:  Principal Problem:   Urinary tract infection Active Problems:   Dementia ([facility])   Weakness   Hypotension   Acute encephalopathy  Principal discharge diagnosis: Worsening generalized weakness/acute metabolic encephalopathy secondary to Enterococcus UTI in setting of dementia.  Discharge Instructions  Discharge Instructions    Diet - low sodium heart healthy   Complete by: As directed    Increase activity slowly   Complete by: As directed      Allergies as of 09/14/2019      Reactions   Propoxyphene Anaphylaxis   Ciprofloxacin Diarrhea   Citalopram Other (See Comments)   Reaction is unknown   Codeine Other (See Comments)   Altered mental status "Makes Crazy"   Latex    Levaquin [levofloxacin In D5w] Diarrhea   Oxycontin [oxycodone Hcl] Other (See Comments)   Altered mental status  Seroquel [quetiapine] Other (See Comments)   Hallucinations    Sulfa Antibiotics Swelling   Ultram [tramadol]    AMS   Zoloft [sertraline Hcl] Other (See Comments)   Adhesive [tape] Itching,  Swelling, Rash   Paper tape is ok   Aspirin Rash   Penicillins Swelling, Rash      Medication List    STOP taking these medications   doxycycline 100 MG capsule Commonly known as: Vibramycin     TAKE these medications   benazepril 10 MG tablet Commonly known as: LOTENSIN One tab by mouth daily. Needs appointment for future refills. What changed:   how much to take  how to take this  when to take this   calcium carbonate 1250 (500 Ca) MG tablet Commonly known as: OS-CAL - dosed in mg of elemental calcium Take 1 tablet by mouth 2 (two) times daily with a meal.   cholecalciferol 1000 units tablet Commonly known as: VITAMIN D Take 1,000 Units by mouth daily.   Cranberry 450 MG Caps Take 1 capsule by mouth daily.   diclofenac sodium 1 % Gel Commonly known as: VOLTAREN Apply 2 g topically 4 (four) times daily as needed (for pain).   donepezil 5 MG tablet Commonly known as: Aricept Take 1 tablet (5 mg total) by mouth at bedtime.   furosemide 20 MG tablet Commonly known as: LASIX Take 1 tablet (20 mg total) by mouth daily. (Needs to be seen before next refill) What changed: additional instructions   ipratropium-albuterol 0.5-2.5 (3) MG/3ML Soln Commonly known as: DUONEB Inhale 3 mLs into the lungs every 6 (six) hours as needed.   loperamide 2 MG tablet Commonly known as: Imodium A-D Take 1 tablet (2 mg total) by mouth 4 (four) times daily as needed for diarrhea or loose stools.   loratadine 10 MG tablet Commonly known as: CLARITIN Take 10 mg by mouth daily.   LORazepam 0.5 MG tablet Commonly known as: ATIVAN Take 1 tablet (0.5 mg total) by mouth at bedtime. What changed:   when to take this  reasons to take this   meclizine 12.5 MG tablet Commonly known as: ANTIVERT Take 1 tablet (12.5 mg total) by mouth 2 (two) times daily as needed for dizziness. Needs appointment for future refills What changed: when to take this   nitrofurantoin 50 MG  capsule Commonly known as: MACRODANTIN Take 1 capsule (50 mg total) by mouth at bedtime.   omeprazole 20 MG capsule Commonly known as: PRILOSEC One cap by mouth daily. Needs appointment for future refills. What changed:   how much to take  how to take this  when to take this  additional instructions   OXYGEN Inhale 2 L into the lungs continuous.   PARoxetine 20 MG tablet Commonly known as: PAXIL TAKE 1 TABLET IN MORNING What changed:   how much to take  how to take this  when to take this  additional instructions   promethazine 25 MG tablet Commonly known as: PHENERGAN Take 25 mg by mouth every 6 (six) hours as needed for nausea or vomiting.   vitamin B-12 1000 MCG tablet Commonly known as: CYANOCOBALAMIN Take 1,000 mcg by mouth daily.      Follow-up Information    Mechele Claude, MD Follow up in 1 week(s).   Specialty: Family Medicine Contact information: [address] [place] [address] [phone]          Allergies  Allergen Reactions  . Propoxyphene Anaphylaxis  . Ciprofloxacin Diarrhea  .  Citalopram Other (See Comments)    Reaction is unknown  . Codeine Other (See Comments)    Altered mental status "Makes Crazy"  . Latex   . Levaquin [Levofloxacin In D5w] Diarrhea  . Oxycontin [Oxycodone Hcl] Other (See Comments)    Altered mental status  . Seroquel [Quetiapine] Other (See Comments)    Hallucinations   . Sulfa Antibiotics Swelling  . Ultram [Tramadol]     AMS  . Zoloft [Sertraline Hcl] Other (See Comments)  . Adhesive [Tape] Itching, Swelling and Rash    Paper tape is ok  . Aspirin Rash  . Penicillins Swelling and Rash    Consultations:  None   Procedures/Studies: DG Chest Port 1 View  Result Date: 09/12/2019 CLINICAL DATA:  Urinary tract infection, weakness EXAM: PORTABLE CHEST 1 VIEW COMPARISON:  03/30/2019 FINDINGS: Single frontal view of the chest demonstrates a stable cardiac silhouette. No airspace disease,  effusion, or pneumothorax. Right chest wall port unchanged. IMPRESSION: 1. Stable exam, no acute process. Electronically Signed   By: Sharlet Salina M.D.   On: 09/12/2019 19:41     Discharge Exam: Vitals:   09/14/19 0547 09/14/19 0757  BP: 131/77   Pulse: 80   Resp: 18   Temp: 98.5 F (36.9 C)   SpO2: 100% 98%   Vitals:   09/13/19 1958 09/13/19 2121 09/14/19 0547 09/14/19 0757  BP:  108/81 131/77   Pulse:  82 80   Resp:  18 18   Temp:  98 F (36.7 C) 98.5 F (36.9 C)   TempSrc:      SpO2: 94% 99% 100% 98%  Weight:      Height:        General: Pt is alert, awake, not in acute distress, pleasantly confused Cardiovascular: RRR, S1/S2 +, no rubs, no gallops Respiratory: CTA bilaterally, no wheezing, no rhonchi Abdominal: Soft, NT, ND, bowel sounds + Extremities: no edema, no cyanosis    The results of significant diagnostics from this hospitalization (including imaging, microbiology, ancillary and laboratory) are listed below for reference.     Microbiology: Recent Results (from the past 240 hour(s))  Blood Culture (routine x 2)     Status: None (Preliminary result)   Collection Time: 09/12/19  8:00 PM   Specimen: Porta Cath; Blood  Result Value Ref Range Status   Specimen Description PORTA CATH COLLECTED BY DOCTOR  Final   Special Requests   Final    BOTTLES DRAWN AEROBIC AND ANAEROBIC Blood Culture adequate volume   Culture   Final    NO GROWTH 2 DAYS Performed at [facility], [address], [place], [place] [address]    Report Status PENDING  Incomplete  Blood Culture (routine x 2)     Status: None (Preliminary result)   Collection Time: 09/12/19  8:00 PM   Specimen: BLOOD RIGHT HAND  Result Value Ref Range Status   Specimen Description BLOOD RIGHT HAND  Final   Special Requests   Final    BOTTLES DRAWN AEROBIC AND ANAEROBIC Blood Culture adequate volume   Culture   Final    NO GROWTH 2 DAYS Performed at [facility], [address], [place], [place]  [address]    Report Status PENDING  Incomplete  Urine culture     Status: Abnormal (Preliminary result)   Collection Time: 09/12/19  8:18 PM   Specimen: In/Out Cath Urine  Result Value Ref Range Status   Specimen Description   Final    IN/OUT CATH URINE Performed  at [facility], [address] [place], [place] [address]    Special Requests   Final    NONE Performed at [facility], [address], [place], [place] [address]    Culture 50,000 COLONIES/mL ENTEROCOCCUS FAECIUM (A)  Final   Report Status PENDING  Incomplete  SARS Coronavirus 2 by RT PCR (hospital order, performed in [facility] hospital lab) Nasopharyngeal Nasopharyngeal Swab     Status: None   Collection Time: 09/13/19 12:21 AM   Specimen: Nasopharyngeal Swab  Result Value Ref Range Status   SARS Coronavirus 2 NEGATIVE NEGATIVE Final    Comment: (NOTE) SARS-CoV-2 target nucleic acids are NOT DETECTED. The SARS-CoV-2 RNA is generally detectable in upper and lower respiratory specimens during the acute phase of infection. The lowest concentration of SARS-CoV-2 viral copies this assay can detect is 250 copies / mL. A negative result does not preclude SARS-CoV-2 infection and should not be used as the sole basis for treatment or other patient management decisions.  A negative result may occur with improper specimen collection / handling, submission of specimen other than nasopharyngeal swab, presence of viral mutation(s) within the areas targeted by this assay, and inadequate number of viral copies (<250 copies / mL). A negative result must be combined with clinical observations, patient history, and epidemiological information. Fact Sheet for Patients:   BoilerBrush.com.cy Fact Sheet for Healthcare Providers: https://pope.com/ This test is not yet approved or cleared  by the [place] FDA and has been authorized for detection and/or diagnosis of SARS-CoV-2 by FDA under  an Emergency Use Authorization (EUA).  This EUA will remain in effect (meaning this test can be used) for the duration of the COVID-19 declaration under Section 564(b)(1) of the Act, 21 U.S.C. section 360bbb-3(b)(1), unless the authorization is terminated or revoked sooner. Performed at [facility], [address], [place], [place] [address]      Labs: BNP (last 3 results) Recent Labs    09/21/18 1405 03/30/19 1537 09/10/19 1644  BNP 151.0* 506.0* 217.0*   Basic Metabolic Panel: Recent Labs  Lab 09/10/19 1645 09/12/19 2000 09/13/19 0620 09/14/19 0537  NA 139 139 138 141  K 4.4 3.5 3.2* 3.8  CL 101 102 100 102  CO2 GLUCOSE 92 93 89 87  BUN CREATININE 0.67 0.67 0.73 0.61  CALCIUM 8.3* 7.9* 8.0* 8.2*  MG  --   --   --  1.8   Liver Function Tests: Recent Labs  Lab 09/10/19 1645 09/12/19 2000 09/13/19 0620  AST ALT ALKPHOS 78 66 69  BILITOT 0.4 0.7 0.8  PROT 5.0* 4.9* 5.2*  ALBUMIN 2.2* 1.8* 2.0*   No results for input(s): LIPASE, AMYLASE in the last 168 hours. No results for input(s): AMMONIA in the last 168 hours. CBC: Recent Labs  Lab 09/10/19 1645 09/12/19 2000 09/13/19 0620 09/14/19 0537  WBC 7.6 8.0 8.1 7.7  NEUTROABS 3.8 4.5  --   --   HGB 10.9* 10.6* 11.5* 10.8*  HCT 33.9* 33.3* 35.7* 34.6*  MCV 95 98.2 98.9 98.9  PLT 243 221 261 217   Cardiac Enzymes: No results for input(s): CKTOTAL, CKMB, CKMBINDEX, TROPONINI in the last 168 hours. BNP: Invalid input(s): POCBNP CBG: No results for input(s): GLUCAP in the last 168 hours. D-Dimer No results for input(s): DDIMER in the last 72 hours. Hgb A1c No results for input(s): HGBA1C in the last 72 hours. Lipid  Profile No results for input(s): CHOL, HDL, LDLCALC, TRIG, CHOLHDL, LDLDIRECT in the last 72 hours. Thyroid function studies No results for input(s): TSH, T4TOTAL, T3FREE, THYROIDAB in the last 72 hours.  Invalid input(s): FREET3 Anemia work  up No results for input(s): VITAMINB12, FOLATE, FERRITIN, TIBC, IRON, RETICCTPCT in the last 72 hours. Urinalysis    Component Value Date/Time   COLORURINE YELLOW 09/12/2019 2018   APPEARANCEUR HAZY (A) 09/12/2019 2018   LABSPEC 1.011 09/12/2019 2018   PHURINE 7.0 09/12/2019 2018   GLUCOSEU NEGATIVE 09/12/2019 2018   HGBUR NEGATIVE 09/12/2019 2018   BILIRUBINUR NEGATIVE 09/12/2019 2018   BILIRUBINUR neg 10/31/2014 1645   KETONESUR NEGATIVE 09/12/2019 2018   PROTEINUR NEGATIVE 09/12/2019 2018   UROBILINOGEN negative 10/31/2014 1645   UROBILINOGEN 0.2 10/06/2014 1920   NITRITE NEGATIVE 09/12/2019 2018   LEUKOCYTESUR MODERATE (A) 09/12/2019 2018   Sepsis Labs Invalid input(s): PROCALCITONIN,  WBC,  LACTICIDVEN Microbiology Recent Results (from the past 240 hour(s))  Blood Culture (routine x 2)     Status: None (Preliminary result)   Collection Time: 09/12/19  8:00 PM   Specimen: Porta Cath; Blood  Result Value Ref Range Status   Specimen Description PORTA CATH COLLECTED BY DOCTOR  Final   Special Requests   Final    BOTTLES DRAWN AEROBIC AND ANAEROBIC Blood Culture adequate volume   Culture   Final    NO GROWTH 2 DAYS Performed at [facility], [address] [place], [place] [address]    Report Status PENDING  Incomplete  Blood Culture (routine x 2)     Status: None (Preliminary result)   Collection Time: 09/12/19  8:00 PM   Specimen: BLOOD RIGHT HAND  Result Value Ref Range Status   Specimen Description BLOOD RIGHT HAND  Final   Special Requests   Final    BOTTLES DRAWN AEROBIC AND ANAEROBIC Blood Culture adequate volume   Culture   Final    NO GROWTH 2 DAYS Performed at [facility], [address] [place], [place] [address]    Report Status PENDING  Incomplete  Urine culture     Status: Abnormal (Preliminary result)   Collection Time: 09/12/19  8:18 PM   Specimen: In/Out Cath Urine  Result Value Ref Range Status   Specimen Description   Final    IN/OUT CATH  URINE Performed at [facility], [address] [place], [place] [address]    Special Requests   Final    NONE Performed at [facility], [address] [place], [place] [address]    Culture 50,000 COLONIES/mL ENTEROCOCCUS FAECIUM (A)  Final   Report Status PENDING  Incomplete  SARS Coronavirus 2 by RT PCR (hospital order, performed in [facility] hospital lab) Nasopharyngeal Nasopharyngeal Swab     Status: None   Collection Time: 09/13/19 12:21 AM   Specimen: Nasopharyngeal Swab  Result Value Ref Range Status   SARS Coronavirus 2 NEGATIVE NEGATIVE Final    Comment: (NOTE) SARS-CoV-2 target nucleic acids are NOT DETECTED. The SARS-CoV-2 RNA is generally detectable in upper and lower respiratory specimens during the acute phase of infection. The lowest concentration of SARS-CoV-2 viral copies this assay can detect is 250 copies / mL. A negative result does not preclude SARS-CoV-2 infection and should not be used as the sole basis for treatment or other patient management decisions.  A negative result may occur with improper specimen collection / handling, submission of specimen other than nasopharyngeal swab, presence of viral mutation(s) within the areas  targeted by this assay, and inadequate number of viral copies (<250 copies / mL). A negative result must be combined with clinical observations, patient history, and epidemiological information. Fact Sheet for Patients:   BoilerBrush.com.cy Fact Sheet for Healthcare Providers: https://pope.com/ This test is not yet approved or cleared  by the [place] FDA and has been authorized for detection and/or diagnosis of SARS-CoV-2 by FDA under an Emergency Use Authorization (EUA).  This EUA will remain in effect (meaning this test can be used) for the duration of the COVID-19 declaration under Section 564(b)(1) of the Act, 21 U.S.C. section 360bbb-3(b)(1), unless the authorization is  terminated or revoked sooner. Performed at [facility], [address], [place], [place] [address]      Time coordinating discharge: 35 minutes  SIGNED:   Erick Blinks, DO [facility] 09/14/2019, 11:31 AM  If 7PM-7AM, please contact night-coverage [email or web address]

## 2019-09-15 ENCOUNTER — Telehealth: Payer: Self-pay | Admitting: *Deleted

## 2019-09-15 LAB — BLOOD CULTURE ID PANEL (REFLEXED)

## 2019-09-15 LAB — URINE CULTURE: Culture: 80000 — AB

## 2019-09-15 NOTE — Telephone Encounter (Signed)
TRANSITIONAL CARE MANAGEMENT TELEPHONE OUTREACH NOTE   Contact Date: 09/15/2019 Contacted By: Hilton Cork, LPN   DISCHARGE INFORMATION Date of Discharge:09/14/19 Discharge Facility: [facility] Principal Discharge Diagnosis: Urinary Tract Infection  Outpatient Follow Up Recommendations (copied from discharge summary)Recommendations for Outpatient Follow-up:  1. Follow up with PCP in 1-2 weeks 2. Patient given fosfomycin prior to discharge and noted to have Enterococcus UTI 3. She has frequent recurrences of UTI and will remain on nitrofurantoin as prescribed for prophylaxis 4. Imodium given as needed for diarrhea Continue remainder of home medications as prescribed    Megan Fisher is a female primary care patient of Mechele Claude, MD. An outgoing telephone call was made today and I spoke with Megan Fisher, patients daughter. Ms. Navarrete condition(s) and treatment(s) were discussed. An opportunity to ask questions was provided and all were answered or forwarded as appropriate.    ACTIVITIES OF DAILY LIVING  Megan Fisher lives alone with a 24 hour a day sitter and she cannot perform ADLs independently. her primary caregiver is Megan Fisher and Megan Fisher. she is able to depend on her primary caregiver(s) for consistent help. Transportation to appointments, to pick up medications, and to run errands is not a problem.  (Consider referral to [facility] if transportation or a consistent caregiver is a problem)   Fall Risk Fall Risk  09/10/2019 09/21/2018  Falls in the past year? 0 0  Number falls in past yr: - -  Injury with Fall? - -    high Fall Risk   Home Modifications/Assistive Devices Wheelchair: Yes Cane: No Ramp: Yes Bedside Toilet: Yes Hospital Bed:  Yes Other:    Home Health Services she is not receiving home health services.   Pt was discharged with Hospice.   MEDICATION RECONCILIATION  Ms. Sforza has been able to pick-up all prescribed discharge  medications from the pharmacy.   A post discharge medication reconciliation was performed and the complete medication list was reviewed with the patient/caregiver and is current as of 09/15/2019. Changes highlighted below.  Discontinued Medications STOP taking these medications   doxycycline 100 MG capsule Commonly known as: Vibramycin     Current Medication List Allergies as of 09/15/2019      Reactions   Propoxyphene Anaphylaxis   Ciprofloxacin Diarrhea   Citalopram Other (See Comments)   Reaction is unknown   Codeine Other (See Comments)   Altered mental status "Makes Crazy"   Latex    Levaquin [levofloxacin In D5w] Diarrhea   Oxycontin [oxycodone Hcl] Other (See Comments)   Altered mental status   Seroquel [quetiapine] Other (See Comments)   Hallucinations    Sulfa Antibiotics Swelling   Ultram [tramadol]    AMS   Zoloft [sertraline Hcl] Other (See Comments)   Adhesive [tape] Itching, Swelling, Rash   Paper tape is ok   Aspirin Rash   Penicillins Swelling, Rash      Medication List       Accurate as of Sep 15, 2019  2:58 PM. If you have any questions, ask your nurse or doctor.        benazepril 10 MG tablet Commonly known as: LOTENSIN One tab by mouth daily. Needs appointment for future refills. What changed:   how much to take  how to take this  when to take this   calcium carbonate 1250 (500 Ca) MG tablet Commonly known as: OS-CAL - dosed in mg of elemental calcium Take 1 tablet by mouth 2 (two)  times daily with a meal.   cholecalciferol 1000 units tablet Commonly known as: VITAMIN D Take 1,000 Units by mouth daily.   Cranberry 450 MG Caps Take 1 capsule by mouth daily.   diclofenac sodium 1 % Gel Commonly known as: VOLTAREN Apply 2 g topically 4 (four) times daily as needed (for pain).   donepezil 5 MG tablet Commonly known as: Aricept Take 1 tablet (5 mg total) by mouth at bedtime.   furosemide 20 MG tablet Commonly known as: LASIX Take  1 tablet (20 mg total) by mouth daily. (Needs to be seen before next refill) What changed: additional instructions   ipratropium-albuterol 0.5-2.5 (3) MG/3ML Soln Commonly known as: DUONEB Inhale 3 mLs into the lungs every 6 (six) hours as needed.   loperamide 2 MG tablet Commonly known as: Imodium A-D Take 1 tablet (2 mg total) by mouth 4 (four) times daily as needed for diarrhea or loose stools.   loratadine 10 MG tablet Commonly known as: CLARITIN Take 10 mg by mouth daily.   LORazepam 0.5 MG tablet Commonly known as: ATIVAN Take 1 tablet (0.5 mg total) by mouth at bedtime. What changed:   when to take this  reasons to take this   meclizine 12.5 MG tablet Commonly known as: ANTIVERT Take 1 tablet (12.5 mg total) by mouth 2 (two) times daily as needed for dizziness. Needs appointment for future refills What changed: when to take this   nitrofurantoin 50 MG capsule Commonly known as: MACRODANTIN Take 1 capsule (50 mg total) by mouth at bedtime.   omeprazole 20 MG capsule Commonly known as: PRILOSEC One cap by mouth daily. Needs appointment for future refills. What changed:   how much to take  how to take this  when to take this  additional instructions   OXYGEN Inhale 2 L into the lungs continuous.   PARoxetine 20 MG tablet Commonly known as: PAXIL TAKE 1 TABLET IN MORNING What changed:   how much to take  how to take this  when to take this  additional instructions   promethazine 25 MG tablet Commonly known as: PHENERGAN Take 25 mg by mouth every 6 (six) hours as needed for nausea or vomiting.   vitamin B-12 1000 MCG tablet Commonly known as: CYANOCOBALAMIN Take 1,000 mcg by mouth daily.        PATIENT EDUCATION & FOLLOW-UP PLAN  An appointment for Transitional Care Management is scheduled with Claretta Fraise, MD on 09/22/19 at 2:10 PM.  Take all medications as prescribed  Contact our office by calling [phone] if you have any  questions or concerns

## 2019-09-15 NOTE — Progress Notes (Addendum)
Date 09/15/2019 Time 11:06 PM  Got a call from lab that patient was discharged yesterday had positive blood cultures gram-positive cocci growing in anaerobic bottle.  As per lab technician she earlier had gram-positive cocci growing in the aerobic bottle and was called around 8 AM this morning to the hospitalist at [facility] hospital. Reviewed discharge summary, patient had Enterococcus a growing in the urine and was discharged on nitrofurantoin. Final culture result is currently pending. Hopefully it is Enterococcus.  We will have to follow final culture results. Discussed with [facility] tonight, she will pass it onto [facility] in the morning.

## 2019-09-16 ENCOUNTER — Other Ambulatory Visit: Payer: Self-pay | Admitting: Family Medicine

## 2019-09-17 ENCOUNTER — Other Ambulatory Visit: Payer: Self-pay

## 2019-09-17 ENCOUNTER — Inpatient Hospital Stay (HOSPITAL_COMMUNITY)
Admission: AD | Admit: 2019-09-17 | Discharge: 2019-09-19 | DRG: 690 | Disposition: A | Discharge status: Home Health | Payer: Medicare Other | Attending: Internal Medicine | Admitting: Internal Medicine

## 2019-09-17 DIAGNOSIS — I69354 Hemiplegia and hemiparesis following cerebral infarction affecting left non-dominant side: Secondary | ICD-10-CM | POA: Diagnosis not present

## 2019-09-17 DIAGNOSIS — Z7401 Bed confinement status: Secondary | ICD-10-CM

## 2019-09-17 DIAGNOSIS — F329 Major depressive disorder, single episode, unspecified: Secondary | ICD-10-CM | POA: Diagnosis present

## 2019-09-17 DIAGNOSIS — J9611 Chronic respiratory failure with hypoxia: Secondary | ICD-10-CM | POA: Diagnosis present

## 2019-09-17 DIAGNOSIS — K219 Gastro-esophageal reflux disease without esophagitis: Secondary | ICD-10-CM | POA: Diagnosis present

## 2019-09-17 DIAGNOSIS — B952 Enterococcus as the cause of diseases classified elsewhere: Secondary | ICD-10-CM | POA: Diagnosis present

## 2019-09-17 DIAGNOSIS — F0390 Unspecified dementia without behavioral disturbance: Secondary | ICD-10-CM | POA: Diagnosis present

## 2019-09-17 DIAGNOSIS — Z66 Do not resuscitate: Secondary | ICD-10-CM | POA: Diagnosis present

## 2019-09-17 DIAGNOSIS — Z87440 Personal history of urinary (tract) infections: Secondary | ICD-10-CM

## 2019-09-17 DIAGNOSIS — Z9981 Dependence on supplemental oxygen: Secondary | ICD-10-CM

## 2019-09-17 DIAGNOSIS — Z79899 Other long term (current) drug therapy: Secondary | ICD-10-CM | POA: Diagnosis not present

## 2019-09-17 DIAGNOSIS — F419 Anxiety disorder, unspecified: Secondary | ICD-10-CM | POA: Diagnosis present

## 2019-09-17 DIAGNOSIS — E669 Obesity, unspecified: Secondary | ICD-10-CM | POA: Diagnosis present

## 2019-09-17 DIAGNOSIS — R5381 Other malaise: Secondary | ICD-10-CM | POA: Diagnosis not present

## 2019-09-17 DIAGNOSIS — N390 Urinary tract infection, site not specified: Principal | ICD-10-CM | POA: Diagnosis present

## 2019-09-17 DIAGNOSIS — Z8673 Personal history of transient ischemic attack (TIA), and cerebral infarction without residual deficits: Secondary | ICD-10-CM

## 2019-09-17 DIAGNOSIS — R7881 Bacteremia: Secondary | ICD-10-CM | POA: Diagnosis not present

## 2019-09-17 DIAGNOSIS — I10 Essential (primary) hypertension: Secondary | ICD-10-CM | POA: Diagnosis present

## 2019-09-17 DIAGNOSIS — Z993 Dependence on wheelchair: Secondary | ICD-10-CM

## 2019-09-17 DIAGNOSIS — R0902 Hypoxemia: Secondary | ICD-10-CM | POA: Diagnosis not present

## 2019-09-17 LAB — CBC
HCT: 36.1 % (ref 36.0–46.0)
Hemoglobin: 11.5 g/dL — ABNORMAL LOW (ref 12.0–15.0)
MCH: 31.4 pg (ref 26.0–34.0)
MCHC: 31.9 g/dL (ref 30.0–36.0)
MCV: 98.6 fL (ref 80.0–100.0)
Platelets: 258 10*3/uL (ref 150–400)
RBC: 3.66 MIL/uL — ABNORMAL LOW (ref 3.87–5.11)
RDW: 15.8 % — ABNORMAL HIGH (ref 11.5–15.5)
WBC: 6.8 10*3/uL (ref 4.0–10.5)
nRBC: 0 % (ref 0.0–0.2)

## 2019-09-17 LAB — COMPREHENSIVE METABOLIC PANEL
ALT: 15 U/L (ref 0–44)
AST: 26 U/L (ref 15–41)
Albumin: 1.9 g/dL — ABNORMAL LOW (ref 3.5–5.0)
Alkaline Phosphatase: 64 U/L (ref 38–126)
Anion gap: 9 (ref 5–15)
BUN: 9 mg/dL (ref 8–23)
CO2: 28 mmol/L (ref 22–32)
Calcium: 7.9 mg/dL — ABNORMAL LOW (ref 8.9–10.3)
Chloride: 101 mmol/L (ref 98–111)
Creatinine, Ser: 0.67 mg/dL (ref 0.44–1.00)
GFR calc Af Amer: >60 mL/min (ref >60)
GFR calc non Af Amer: >60 mL/min (ref >60)
Glucose, Bld: 98 mg/dL (ref 70–99)
Potassium: 4 mmol/L (ref 3.5–5.1)
Sodium: 138 mmol/L (ref 135–145)
Total Bilirubin: 0.7 mg/dL (ref 0.3–1.2)
Total Protein: 5.2 g/dL — ABNORMAL LOW (ref 6.5–8.1)

## 2019-09-17 MED ORDER — ONDANSETRON HCL 4 MG/2ML IJ SOLN: 4.0000 mg | Freq: Four times a day (QID) | INTRAMUSCULAR | Status: DC | PRN

## 2019-09-17 MED ORDER — PANTOPRAZOLE SODIUM 40 MG PO TBEC
40.0000 mg | DELAYED_RELEASE_TABLET | Freq: Every day | ORAL | Status: DC
  Administered 2019-09-17 – 2019-09-19 (×3): 40 mg via ORAL
  Filled 2019-09-17 (×3): qty 1

## 2019-09-17 MED ORDER — VANCOMYCIN HCL 1500 MG/300ML IV SOLN
1500.0000 mg | Freq: Once | INTRAVENOUS | Status: AC
  Administered 2019-09-18: 1500 mg via INTRAVENOUS
  Filled 2019-09-17: qty 300

## 2019-09-17 MED ORDER — LORAZEPAM 0.5 MG PO TABS
0.5000 mg | ORAL_TABLET | Freq: Every evening | ORAL | Status: DC | PRN
  Administered 2019-09-17 – 2019-09-18 (×2): 0.5 mg via ORAL
  Filled 2019-09-17 (×2): qty 1

## 2019-09-17 MED ORDER — VANCOMYCIN HCL 750 MG/150ML IV SOLN
750.0000 mg | Freq: Two times a day (BID) | INTRAVENOUS | Status: DC
  Administered 2019-09-18 – 2019-09-19 (×3): 750 mg via INTRAVENOUS
  Filled 2019-09-17 (×3): qty 150

## 2019-09-17 MED ORDER — MELATONIN 3 MG PO TABS
3.0000 mg | ORAL_TABLET | Freq: Every day | ORAL | Status: DC
  Administered 2019-09-17 – 2019-09-18 (×2): 3 mg via ORAL
  Filled 2019-09-17 (×2): qty 1

## 2019-09-17 MED ORDER — LOPERAMIDE HCL 2 MG PO TABS
2.0000 mg | ORAL_TABLET | Freq: Four times a day (QID) | ORAL | Status: DC | PRN
  Filled 2019-09-17: qty 1

## 2019-09-17 MED ORDER — FUROSEMIDE 20 MG PO TABS
20.0000 mg | ORAL_TABLET | Freq: Every day | ORAL | Status: DC
  Administered 2019-09-18 – 2019-09-19 (×2): 20 mg via ORAL
  Filled 2019-09-17 (×3): qty 1

## 2019-09-17 MED ORDER — HEPARIN SODIUM (PORCINE) 5000 UNIT/ML IJ SOLN
5000.0000 [IU] | Freq: Three times a day (TID) | INTRAMUSCULAR | Status: DC
  Administered 2019-09-17 – 2019-09-19 (×6): 5000 [IU] via SUBCUTANEOUS
  Filled 2019-09-17 (×5): qty 1

## 2019-09-17 MED ORDER — PAROXETINE HCL 20 MG PO TABS
30.0000 mg | ORAL_TABLET | Freq: Every morning | ORAL | Status: DC
  Administered 2019-09-18 – 2019-09-19 (×2): 30 mg via ORAL
  Filled 2019-09-17 (×3): qty 1

## 2019-09-17 MED ORDER — VITAMIN B-12 1000 MCG PO TABS
1000.0000 ug | ORAL_TABLET | Freq: Every day | ORAL | Status: DC
  Administered 2019-09-17 – 2019-09-19 (×3): 1000 ug via ORAL
  Filled 2019-09-17 (×3): qty 1

## 2019-09-17 MED ORDER — IPRATROPIUM-ALBUTEROL 0.5-2.5 (3) MG/3ML IN SOLN: 3.0000 mL | Freq: Four times a day (QID) | RESPIRATORY_TRACT | Status: DC | PRN

## 2019-09-17 MED ORDER — DONEPEZIL HCL 5 MG PO TABS
5.0000 mg | ORAL_TABLET | Freq: Every day | ORAL | Status: DC
  Administered 2019-09-17 – 2019-09-18 (×2): 5 mg via ORAL
  Filled 2019-09-17 (×2): qty 1

## 2019-09-17 MED ORDER — BENAZEPRIL HCL 10 MG PO TABS
10.0000 mg | ORAL_TABLET | Freq: Every day | ORAL | Status: DC
  Administered 2019-09-18 – 2019-09-19 (×2): 10 mg via ORAL
  Filled 2019-09-17 (×3): qty 1

## 2019-09-17 MED ORDER — ONDANSETRON HCL 4 MG PO TABS: 4.0000 mg | ORAL_TABLET | Freq: Four times a day (QID) | ORAL | Status: DC | PRN

## 2019-09-17 MED ORDER — LORATADINE 10 MG PO TABS
10.0000 mg | ORAL_TABLET | Freq: Every day | ORAL | Status: DC
  Administered 2019-09-17 – 2019-09-19 (×3): 10 mg via ORAL
  Filled 2019-09-17 (×3): qty 1

## 2019-09-17 MED ORDER — VITAMIN D 25 MCG (1000 UNIT) PO TABS
1000.0000 [IU] | ORAL_TABLET | Freq: Every day | ORAL | Status: DC
  Administered 2019-09-17 – 2019-09-19 (×3): 1000 [IU] via ORAL
  Filled 2019-09-17 (×3): qty 1

## 2019-09-17 MED ORDER — CALCIUM CARBONATE 1250 (500 CA) MG PO TABS
1.0000 | ORAL_TABLET | Freq: Two times a day (BID) | ORAL | Status: DC
  Administered 2019-09-18 – 2019-09-19 (×3): 500 mg via ORAL
  Filled 2019-09-17 (×4): qty 1

## 2019-09-17 NOTE — H&P (Signed)
TRH H&P   Patient Demographics:    Megan Fisher, is a 86 y.o. female  MRN: [id number]   DOB - [birth year]  Admit Date - 09/17/2019  Outpatient Primary MD for the patient is Mechele Claude, MD  Referring MD/NP/PA: direct admission.  Patient coming from: Home  No chief complaint on file.     HPI:    Megan Fisher  is a 86 y.o. female, with medical history significant of hypertension, CVA with residual left-sided weakness, anxiety, recurrent UTI, obesity and newly diagnosed dementia, patient with recent hospitalization for increased confusion and weakness, her work-up was significant for Enterococcus UTI, she was treated with fosfomycin during hospital stay, and discharged on oral nitrofurantoin, after her discharge patient was noted to have blood culture growing gram-positive cocci, 1 out of 2 sets in each of her Port-A-Cath and peripheral cultures, this was discussed with ID, who recommended patient to be directly admitted to the hospital for IV vancomycin, pending final sensitivity and specifically on those cultures, patient lives at home, with 24-hour caregiver, she is bedbound at baseline, using wheelchair intermittently, was discharged, there is no fever, chills, or worsening encephalopathy or confusion, patient does report some swelling occasionally, she denies any at her Port-A-Cath site, cough, fever or chills. -Her labs are pending including CBC, CMP, will repeat blood cultures x2 peripherally, and will repeat Port-A-Cath culture as well.    Review of systems:    In addition to the HPI above,  No Fever-chills, but she does report some sweating No Headache, No changes with Vision or hearing, No problems swallowing food or Liquids, No Chest pain, Cough or Shortness of Breath, No Abdominal pain, No Nausea or Vommitting, Bowel movements are regular, No Blood in stool or Urine, No  dysuria, No new skin rashes or bruises, No new joints pains-aches,  No new weakness, tingling, numbness in any extremity, No recent weight gain or loss, No polyuria, polydypsia or polyphagia, No significant Mental Stressors.  A full 10 point Review of Systems was done, except as stated above, all other Review of Systems were negative.   With Past History of the following :    Past Medical History:  Diagnosis Date  . Arthritis   . Hernia   . Hypertension   . Obesity   . Sepsis secondary to UTI (HCC) 03/30/2019  . Vertigo       Past Surgical History:  Procedure Laterality Date  . BREAST SURGERY    . CHOLECYSTECTOMY    . HERNIA REPAIR    . ooperectomy    . TONSILLECTOMY        Social History:     Social History   Tobacco Use  . Smoking status: Never Smoker  . Smokeless tobacco: Never Used  Substance Use Topics  . Alcohol use: No     Lives -Home with 24-hour caregiver  Mobility -bedbound/occasionally uses wheelchair  Family History :   Irving Burton history was reviewed, it is nonpertinent   Home Medications:   Prior to Admission medications   Medication Sig Start Date End Date Taking? Authorizing Provider  benazepril (LOTENSIN) 10 MG tablet One tab by mouth daily. Needs appointment for future refills. Patient taking differently: Take 10 mg by mouth daily. One tab by mouth daily. Needs appointment for future refills. 08/09/19  Yes Stacks, Broadus John, MD  calcium carbonate (OS-CAL - DOSED IN MG OF ELEMENTAL CALCIUM) 1250 (500 Ca) MG tablet Take 1 tablet by mouth 2 (two) times daily with a meal.    Yes [provider]  cholecalciferol (VITAMIN D) 1000 UNITS tablet Take 1,000 Units by mouth daily.   Yes [provider]  Cranberry 450 MG CAPS Take 1 capsule by mouth daily.    Yes [provider]  donepezil (ARICEPT) 5 MG tablet Take 1 tablet (5 mg total) by mouth at bedtime. 09/10/19  Yes Mechele Claude, MD  diclofenac sodium (VOLTAREN) 1 % GEL  Apply 2 g topically 4 (four) times daily as needed (for pain).     [provider]  furosemide (LASIX) 20 MG tablet TAKE 1 TABLET DAILY 09/17/19   Mechele Claude, MD  ipratropium-albuterol (DUONEB) 0.5-2.5 (3) MG/3ML SOLN Inhale 3 mLs into the lungs every 6 (six) hours as needed. 09/18/17   [provider]  loperamide (IMODIUM A-D) 2 MG tablet Take 1 tablet (2 mg total) by mouth 4 (four) times daily as needed for diarrhea or loose stools. 09/14/19   Sherryll Burger, Pratik D, DO  loratadine (CLARITIN) 10 MG tablet Take 10 mg by mouth daily.    [provider]  LORazepam (ATIVAN) 0.5 MG tablet Take 1 tablet (0.5 mg total) by mouth at bedtime. Patient taking differently: Take 0.5 mg by mouth at bedtime as needed for sleep.  10/05/18   Mechele Claude, MD  meclizine (ANTIVERT) 12.5 MG tablet TAKE 1 TABLET TWICE A DAY AS NEEDED FOR DIZZINESS 09/17/19   Mechele Claude, MD  nitrofurantoin (MACRODANTIN) 50 MG capsule Take 1 capsule (50 mg total) by mouth at bedtime. 09/14/19 10/14/19  Sherryll Burger, Pratik D, DO  omeprazole (PRILOSEC) 20 MG capsule One cap by mouth daily. Needs appointment for future refills. Patient taking differently: Take 20 mg by mouth daily.  08/09/19   Mechele Claude, MD  OXYGEN Inhale 2 L into the lungs continuous.    [provider]  PARoxetine (PAXIL) 20 MG tablet TAKE 1 TABLET IN MORNING Patient taking differently: Take 30 mg by mouth every morning.  09/21/18   Mechele Claude, MD  promethazine (PHENERGAN) 25 MG tablet Take 25 mg by mouth every 6 (six) hours as needed for nausea or vomiting.    [provider]  vitamin B-12 (CYANOCOBALAMIN) 1000 MCG tablet Take 1,000 mcg by mouth daily.    [provider]     Allergies:     Allergies  Allergen Reactions  . Propoxyphene Anaphylaxis  . Ciprofloxacin Diarrhea  . Citalopram Other (See Comments)    Reaction is unknown  . Codeine Other (See Comments)    Altered mental status "Makes Crazy"  . Latex   .  Levaquin [Levofloxacin In D5w] Diarrhea  . Oxycontin [Oxycodone Hcl] Other (See Comments)    Altered mental status  . Seroquel [Quetiapine] Other (See Comments)    Hallucinations   . Sulfa Antibiotics Swelling  . Ultram [Tramadol]     AMS  . Zoloft [Sertraline Hcl] Other (See Comments)  . Adhesive [  Tape] Itching, Swelling and Rash    Paper tape is ok  . Aspirin Rash  . Penicillins Swelling and Rash     Physical Exam:   Vitals  Blood pressure (!) 96/54, pulse 77, temperature 98.8 F (37.1 C), temperature source Oral, resp. rate 16, SpO2 98 %.   1. General elderly female, laying in bed, in NAD,    2.  She is awake alert x3, but does appear to be easily distracted,  some impaired cognition and insight.  3. No F.N deficits, ALL C.Nerves Intact, patient with residual left-sided weakness, and contracture in left upper extremity .  4. Ears and Eyes appear Normal, Conjunctivae clear, PERRLA. Moist Oral Mucosa.  5. Supple Neck, No JVD, No cervical lymphadenopathy appriciated, No Carotid Bruits.  6. Symmetrical Chest wall movement, Good air movement bilaterally, CTAB.  Right upper chest Port-A-Cath accessed, site looks clean  7. RRR, No Gallops, Rubs or Murmurs, No Parasternal Heave.  8. Positive Bowel Sounds, Abdomen Soft, No tenderness, No organomegaly appriciated,No rebound -guarding or rigidity.  9.  No Cyanosis, Normal Skin Turgor, No Skin Rash or Bruise.  10. Good muscle tone,  joints appear normal , no effusions, Normal ROM.  11. No Palpable Lymph Nodes in Neck or Axillae     Data Review:    CBC Recent Labs  Lab 09/12/19 2000 09/13/19 0620 09/14/19 0537  WBC 8.0 8.1 7.7  HGB 10.6* 11.5* 10.8*  HCT 33.3* 35.7* 34.6*  PLT 221 261 217  MCV 98.2 98.9 98.9  MCH 31.3 31.9 30.9  MCHC 31.8 32.2 31.2  RDW 15.5 15.7* 15.5  LYMPHSABS 2.7  --   --   MONOABS 0.6  --   --   EOSABS 0.2  --   --   BASOSABS 0.0  --   --     ------------------------------------------------------------------------------------------------------------------  Chemistries  Recent Labs  Lab 09/12/19 2000 09/13/19 0620 09/14/19 0537  NA 139 138 141  K 3.5 3.2* 3.8  CL 102 100 102  CO2 30 30 31   GLUCOSE 93 89 87  BUN 10 11 11   CREATININE 0.67 0.73 0.61  CALCIUM 7.9* 8.0* 8.2*  MG  --   --  1.8  AST 23 23  --   ALT 17 16  --   ALKPHOS 66 69  --   BILITOT 0.7 0.8  --    ------------------------------------------------------------------------------------------------------------------ estimated creatinine clearance is 55.5 mL/min (by C-G formula based on SCr of 0.61 mg/dL). ------------------------------------------------------------------------------------------------------------------ No results for input(s): TSH, T4TOTAL, T3FREE, THYROIDAB in the last 72 hours.  Invalid input(s): FREET3  Coagulation profile Recent Labs  Lab 09/12/19 2000  INR 1.1   ------------------------------------------------------------------------------------------------------------------- No results for input(s): DDIMER in the last 72 hours. -------------------------------------------------------------------------------------------------------------------  Cardiac Enzymes No results for input(s): CKMB, TROPONINI, MYOGLOBIN in the last 168 hours.  Invalid input(s): CK ------------------------------------------------------------------------------------------------------------------    Component Value Date/Time   BNP 217.0 (H) 09/10/2019 1644   BNP 506.0 (H) 03/30/2019 1537     ---------------------------------------------------------------------------------------------------------------  Urinalysis    Component Value Date/Time   COLORURINE YELLOW 09/12/2019 2018   APPEARANCEUR HAZY (A) 09/12/2019 2018   LABSPEC 1.011 09/12/2019 2018   [place] 7.0 09/12/2019 2018   GLUCOSEU NEGATIVE 09/12/2019 2018   HGBUR NEGATIVE 09/12/2019  2018   [place] NEGATIVE 09/12/2019 2018   BILIRUBINUR neg 10/31/2014 [place] 09/12/2019 2018   PROTEINUR NEGATIVE 09/12/2019 2018   UROBILINOGEN negative 10/31/2014 1645   UROBILINOGEN 0.2 10/06/2014 1920   NITRITE NEGATIVE 09/12/2019  2018   LEUKOCYTESUR MODERATE (A) 09/12/2019 2018    ----------------------------------------------------------------------------------------------------------------   Imaging Results:    No results found.    Assessment & Plan:    Active Problems:   Hypertension   Dementia (HCC)   History of CVA (cerebrovascular accident)   DNR (do not resuscitate)   Bacteremia  Bacteremia -Blood cultures growing gram-positive cocci, 1 out of 2 bottles of each set 1 from Port-A-Cath, and another from peripheral, she presents initially with Enterococcus UTI, sensitive to vancomycin, she received fosfomycin discharged and treated with nitrofurantoin. -This was discussed with ID, recommended patient to be readmitted to the hospital, for IV vancomycin treatment till final results were obtained on these cultures. -Obtain 2 sets of blood cultures peripheral, and 1 set through her Port-A-Cath, after that we will start on IV vancomycin following final sensitivity and specifically on her previous cultures during previous admission.  Enterococcus UTI -She was discharged on nitrofurantoin, now she will be on IV vancomycin during hospital stay.  Dementia/anxiety/depression -Continue with home medication  GERD -Continue with PPI  Chronic hypoxic respiratory failure -On 2 L oxygen at baseline  Hypertension -Continue with benazepril   DVT Prophylaxis [place] Heparin   AM Labs Ordered, also please review Full Orders  Family Communication: Admission, patients condition and plan of care including tests being ordered have been discussed with the patient and son via phone a who indicate understanding and agree with the plan and Code Status.  Code Status  DNR confirmed by son  Likely DC to Home  Condition GUARDED    Consults called: None  Admission status: inpatient   Time spent in minutes : 55 minutes   Huey Bienenstock M.D on 09/17/2019 at 8:20 PM   [facility] - Office  [phone]

## 2019-09-17 NOTE — Progress Notes (Signed)
Pharmacy Antibiotic Note  Megan Fisher is a 86 y.o. female admitted on 09/17/2019 with bacteremia and previous UTI with enterococcus.  Pharmacy has been consulted for Vancomycin dosing.  Patient has an estimated crcl of 43ml/min and a creatinine level of 0.67.  Her WBC on 5/25 was 7.7.  She has no fever or chills but has confusion and weakness.  Blood cultures are positive for GPC in 1 of 2 sets, pending speciation.  Urine cultures on 5/23 was positive for ENTEROCOCCUS FAECIUM.  Plan:  - Will give loading dose of 1500mg  x 1  - Vancomycin 750mg  IV every 12 hours  - Will f/u sensitivities and any micro data  - Monitor renal function and adjust levels as appropriate.  Temp (24hrs), Avg:98.8 F (37.1 C), Min:98.8 F (37.1 C), Max:98.8 F (37.1 C)  Recent Labs  Lab 09/12/19 2000 09/13/19 0620 09/14/19 0537  WBC 8.0 8.1 7.7  CREATININE 0.67 0.73 0.61  LATICACIDVEN 0.9  --   --     Estimated Creatinine Clearance: 55.5 mL/min (by C-G formula based on SCr of 0.61 mg/dL).    Allergies  Allergen Reactions  . Propoxyphene Anaphylaxis  . Ciprofloxacin Diarrhea  . Citalopram Other (See Comments)    Reaction is unknown  . Codeine Other (See Comments)    Altered mental status "Makes Crazy"  . Latex   . Levaquin [Levofloxacin In D5w] Diarrhea  . Oxycontin [Oxycodone Hcl] Other (See Comments)    Altered mental status  . Seroquel [Quetiapine] Other (See Comments)    Hallucinations   . Sulfa Antibiotics Swelling  . Ultram [Tramadol]     AMS  . Zoloft [Sertraline Hcl] Other (See Comments)  . Adhesive [Tape] Itching, Swelling and Rash    Paper tape is ok  . Aspirin Rash  . Penicillins Swelling and Rash    Antimicrobials this admission: 5/28 >> Vancomycin  Dose adjustments this admission:   Microbiology results: 5/29 BCx:   UCx:    Sputum:   MRSA PCR:   6/28, PharmD., MS Clinical Pharmacist Pager:  [phone] Thank you for allowing pharmacy to be part of this  patients care team. 09/17/2019 9:36 PM

## 2019-09-17 NOTE — Progress Notes (Signed)
09/17/19 16:00  Followed up on positive blood cultures. Patient was recently discharged from the hospital after being treated for UTI from enterococcus. Post discharge, she was found to have GPC in blood cultures.  Called patient's son for update on patient's condition. He reports that she is doing well, no fevers, no lethargy.  Blood cultures continue to show GPC in both sets of cultures. This has not been further identified. BCID was found to be negative. Complicating factors includes her having a chronic port. I called the micro lab to see if there is any new information, but was told that cultures are still incubating. Case reviewed with Dr. Algis Liming on for infectious disease. Recommendations included for patient to be started on vancomycin until cultures can be further identified. Depending on bacteria that is identified, decision can be made whether port will need to be removed as well as length of treatment  I have discussed these recommendations with patient's son. I also reviewed that she is being followed by hospice and whether she would want to have all these interventions done. He reported that home services were more for palliative care rather than hospice and he is agreeable to have home IV antibiotics and port removed if necessary. Will request direct admission to Medsurg bed for further treatments.  [employer]

## 2019-09-18 DIAGNOSIS — Z66 Do not resuscitate: Secondary | ICD-10-CM

## 2019-09-18 DIAGNOSIS — F0390 Unspecified dementia without behavioral disturbance: Secondary | ICD-10-CM

## 2019-09-18 DIAGNOSIS — I10 Essential (primary) hypertension: Secondary | ICD-10-CM

## 2019-09-18 DIAGNOSIS — R7881 Bacteremia: Secondary | ICD-10-CM

## 2019-09-18 LAB — BASIC METABOLIC PANEL
Anion gap: 7 (ref 5–15)
BUN: 11 mg/dL (ref 8–23)
CO2: 30 mmol/L (ref 22–32)
Calcium: 8.2 mg/dL — ABNORMAL LOW (ref 8.9–10.3)
Chloride: 102 mmol/L (ref 98–111)
Creatinine, Ser: 0.6 mg/dL (ref 0.44–1.00)
GFR calc Af Amer: >60 mL/min (ref >60)
GFR calc non Af Amer: >60 mL/min (ref >60)
Glucose, Bld: 91 mg/dL (ref 70–99)
Potassium: 3.9 mmol/L (ref 3.5–5.1)
Sodium: 139 mmol/L (ref 135–145)

## 2019-09-18 LAB — CBC
HCT: 35.2 % — ABNORMAL LOW (ref 36.0–46.0)
Hemoglobin: 11 g/dL — ABNORMAL LOW (ref 12.0–15.0)
MCH: 31.1 pg (ref 26.0–34.0)
MCHC: 31.3 g/dL (ref 30.0–36.0)
MCV: 99.4 fL (ref 80.0–100.0)
Platelets: 202 10*3/uL (ref 150–400)
RBC: 3.54 MIL/uL — ABNORMAL LOW (ref 3.87–5.11)
RDW: 15.6 % — ABNORMAL HIGH (ref 11.5–15.5)
WBC: 5.7 10*3/uL (ref 4.0–10.5)
nRBC: 0 % (ref 0.0–0.2)

## 2019-09-18 MED ORDER — LOPERAMIDE HCL 2 MG PO CAPS
2.0000 mg | ORAL_CAPSULE | Freq: Four times a day (QID) | ORAL | Status: DC | PRN
  Administered 2019-09-18 – 2019-09-19 (×2): 2 mg via ORAL
  Filled 2019-09-18 (×2): qty 1

## 2019-09-18 NOTE — Progress Notes (Signed)
PROGRESS NOTE    [name]  ZOX:[id number] DOB: 05/17/1928 DOA: 09/17/2019 PCP: Mechele ClaudeStacks, Warren, MD    Brief Narrative:  HPI:  Megan Fisher  is a [age] y.o. female, with medical history significant ofhypertension, CVA with residual left-sided weakness, anxiety, recurrent UTI, obesity and newly diagnosed dementia, patient with recent hospitalization for increased confusion and weakness, her work-up was significant for Enterococcus UTI, she was treated with fosfomycin during hospital stay, and discharged on oral nitrofurantoin, after her discharge patient was noted to have blood culture growing gram-positive cocci, 1 out of 2 sets in each of her Port-A-Cath and peripheral cultures, this was discussed with ID, who recommended patient to be directly admitted to the hospital for IV vancomycin, pending final sensitivity and specifically on those cultures, patient lives at home, with 24-hour caregiver, she is bedbound at baseline, using wheelchair intermittently, was discharged, there is no fever, chills, or worsening encephalopathy or confusion, patient does report some swelling occasionally, she denies any at her Port-A-Cath site, cough, fever or chills.   Assessment & Plan:   Active Problems:   Hypertension   Dementia (HCC)   History of CVA (cerebrovascular accident)   DNR (do not resuscitate)   Bacteremia   Bacteremia -Blood cultures from previous admission growing gram-positive cocci, 1 out of 2 bottles of each set 1 from Port-A-Cath, and another from peripheral.  These cultures were repeated on her current admission and are in process. -This was discussed with ID, recommended patient to be readmitted to the hospital, for IV vancomycin treatment till final results were obtained on these cultures. -Discussed with microbiology should hopefully have some definitive results tomorrow. -Further plan of treatment based on culture results.  Enterococcus UTI -She was discharged on nitrofurantoin, now  she will be on IV vancomycin during hospital stay.  Dementia/anxiety/depression -Continue with home medication  GERD -Continue with PPI  Chronic hypoxic respiratory failure -On 2 L oxygen at baseline  Hypertension -Continue with benazepril   DVT prophylaxis: Heparin Code Status: DNR Family Communication: No family present Disposition Plan: Status is: Inpatient  Remains inpatient appropriate because:Ongoing diagnostic testing needed not appropriate for outpatient work up.  Patient needs definitive plan for improved management bacteremia.  Culture still in process.   Dispo: The patient is from: Home              Anticipated d/c is to: Home              Anticipated d/c date is: 1 day              Patient currently is not medically stable to d/c.  Consultants:    Procedures:     Antimicrobials:   Vancomycin 5/28>   Subjective: Patient is confused, she denies any shortness of breath.  She did not have any pain.  Objective: Vitals:   09/18/19 0010 09/18/19 0511 09/18/19 1131 09/18/19 1955  BP: (!) 105/55 (!) 130/59 (!) 143/75   Pulse: 72 71 72   Resp: 16 18 18    Temp: 98.7 F (37.1 C) 98.2 F (36.8 C)    TempSrc: Oral Oral    SpO2: 98% 92%  93%    Intake/Output Summary (Last 24 hours) at 09/18/2019 2048 Last data filed at 09/18/2019 1700 Gross per 24 hour  Intake 1023.52 ml  Output 1000 ml  Net 23.52 ml   There were no vitals filed for this visit.  Examination:  General exam: Appears calm and comfortable  Respiratory system: Clear to auscultation. Respiratory  effort normal. Cardiovascular system: S1 & S2 heard, RRR. No JVD, murmurs, rubs, gallops or clicks. No pedal edema. Gastrointestinal system: Abdomen is nondistended, soft and nontender. No organomegaly or masses felt. Normal bowel sounds heard. Central nervous system: No focal neurological deficits. Extremities: Symmetric 5 x 5 power. Skin: No rashes, lesions or ulcers Psychiatry:  Confused    Data Reviewed: I have personally reviewed following labs and imaging studies  CBC: Recent Labs  Lab 09/12/19 2000 09/13/19 0620 09/14/19 0537 09/17/19 2056 09/18/19 0909  WBC 8.0 8.1 7.7 6.8 5.7  NEUTROABS 4.5  --   --   --   --   HGB 10.6* 11.5* 10.8* 11.5* 11.0*  HCT 33.3* 35.7* 34.6* 36.1 35.2*  MCV 98.2 98.9 98.9 98.6 99.4  PLT 221 261 217 258 202   Basic Metabolic Panel: Recent Labs  Lab 09/12/19 2000 09/13/19 0620 09/14/19 0537 09/17/19 2056 09/18/19 0909  NA 139 138 141 138 139  K 3.5 3.2* 3.8 4.0 3.9  CL 102 100 102 101 102  CO2 30 30 31 28 30   GLUCOSE 93 89 87 98 91  BUN 10 11 11 9 11   CREATININE 0.67 0.73 0.61 0.67 0.60  CALCIUM 7.9* 8.0* 8.2* 7.9* 8.2*  MG  --   --  1.8  --   --    GFR: Estimated Creatinine Clearance: 55.5 mL/min (by C-G formula based on SCr of 0.6 mg/dL). Liver Function Tests: Recent Labs  Lab 09/12/19 2000 09/13/19 0620 09/17/19 2056  AST 23 23 26   ALT 17 16 15   ALKPHOS 66 69 64  BILITOT 0.7 0.8 0.7  PROT 4.9* 5.2* 5.2*  ALBUMIN 1.8* 2.0* 1.9*   No results for input(s): LIPASE, AMYLASE in the last 168 hours. No results for input(s): AMMONIA in the last 168 hours. Coagulation Profile: Recent Labs  Lab 09/12/19 2000  INR 1.1   Cardiac Enzymes: No results for input(s): CKTOTAL, CKMB, CKMBINDEX, TROPONINI in the last 168 hours. BNP (last 3 results) No results for input(s): PROBNP in the last 8760 hours. HbA1C: No results for input(s): HGBA1C in the last 72 hours. CBG: No results for input(s): GLUCAP in the last 168 hours. Lipid Profile: No results for input(s): CHOL, HDL, LDLCALC, TRIG, CHOLHDL, LDLDIRECT in the last 72 hours. Thyroid Function Tests: No results for input(s): TSH, T4TOTAL, FREET4, T3FREE, THYROIDAB in the last 72 hours. Anemia Panel: No results for input(s): VITAMINB12, FOLATE, FERRITIN, TIBC, IRON, RETICCTPCT in the last 72 hours. Sepsis Labs: Recent Labs  Lab 09/12/19 2000   LATICACIDVEN 0.9    Recent Results (from the past 240 hour(s))  Blood Culture (routine x 2)     Status: None (Preliminary result)   Collection Time: 09/12/19  8:00 PM   Specimen: Porta Cath; Blood  Result Value Ref Range Status   Specimen Description   Final    PORTA CATH COLLECTED BY DOCTOR Performed at [facility], [address], [place], 09/14/19 [facility]    Special Requests   Final    BOTTLES DRAWN AEROBIC AND ANAEROBIC Blood Culture adequate volume Performed at [facility], [address] [place], [address] [facility]    Culture  Setup Time   Final    GRAM POSITIVE COCCI AEROBIC BOTTLE ONLY Gram Stain Report Called to,Read Back By and Verified With: DR. [address] AT [place] BY THOMPSON S.    Culture   Final    CULTURE REINCUBATED FOR BETTER GROWTH Performed at [facility], [address], [place], [place] [address]    Report Status PENDING  Incomplete  Blood Culture (routine x 2)     Status: None (Preliminary result)   Collection Time: 09/12/19  8:00 PM   Specimen: BLOOD RIGHT HAND  Result Value Ref Range Status   Specimen Description   Final    BLOOD RIGHT HAND Performed at [facility], [address], [place], [place] [address]    Special Requests   Final    BOTTLES DRAWN AEROBIC AND ANAEROBIC Blood Culture adequate volume Performed at [facility], [address] [place], [place] [address]    Culture  Setup Time   Final    Performed at [facility] GRAM POSITIVE COCCI Gram Stain Report Called to,Read Back By and Verified With: LAMA,DR AT 2255 ON 5.26.21 BY ISLEY,B Performed at [facility], [address], [place], [place] [address]    Culture   Final    CULTURE REINCUBATED FOR BETTER GROWTH CORRECTED ON 05/28 AT 0800: PREVIOUSLY REPORTED AS NO GROWTH 5 DAYS Performed at [facility], [address], [place], [place] [address]    Report Status PENDING  Incomplete  Blood Culture ID Panel (Reflexed)     Status: None   Collection Time:  09/12/19  8:00 PM  Result Value Ref Range Status   Enterococcus species NOT DETECTED NOT DETECTED Final   Listeria monocytogenes NOT DETECTED NOT DETECTED Final   Staphylococcus species NOT DETECTED NOT DETECTED Final   Staphylococcus aureus (BCID) NOT DETECTED NOT DETECTED Final   Streptococcus species NOT DETECTED NOT DETECTED Final   Streptococcus agalactiae NOT DETECTED NOT DETECTED Final   Streptococcus pneumoniae NOT DETECTED NOT DETECTED Final   Streptococcus pyogenes NOT DETECTED NOT DETECTED Final   Acinetobacter baumannii NOT DETECTED NOT DETECTED Final   Enterobacteriaceae species NOT DETECTED NOT DETECTED Final   Enterobacter cloacae complex NOT DETECTED NOT DETECTED Final   Escherichia coli NOT DETECTED NOT DETECTED Final   Klebsiella oxytoca NOT DETECTED NOT DETECTED Final   Klebsiella pneumoniae NOT DETECTED NOT DETECTED Final   Proteus species NOT DETECTED NOT DETECTED Final   Serratia marcescens NOT DETECTED NOT DETECTED Final   Haemophilus influenzae NOT DETECTED NOT DETECTED Final   Neisseria meningitidis NOT DETECTED NOT DETECTED Final   Pseudomonas aeruginosa NOT DETECTED NOT DETECTED Final   Candida albicans NOT DETECTED NOT DETECTED Final   Candida glabrata NOT DETECTED NOT DETECTED Final   Candida krusei NOT DETECTED NOT DETECTED Final   Candida parapsilosis NOT DETECTED NOT DETECTED Final   Candida tropicalis NOT DETECTED NOT DETECTED Final    Comment: Performed at [facility], [address] [place], [place] [address]  Urine culture     Status: Abnormal   Collection Time: 09/12/19  8:18 PM   Specimen: In/Out Cath Urine  Result Value Ref Range Status   Specimen Description   Final    IN/OUT CATH URINE Performed at [facility], [address], [place], [place] [address]    Special Requests   Final    NONE Performed at [facility], [address], [place], [place] [address]    Culture 80,000 COLONIES/mL ENTEROCOCCUS FAECIUM (A)  Final   Report  Status 09/15/2019 FINAL  Final   Organism ID, Bacteria ENTEROCOCCUS FAECIUM (A)  Final      Susceptibility   Enterococcus faecium - MIC*    AMPICILLIN >=32 RESISTANT Resistant     NITROFURANTOIN 64 INTERMEDIATE Intermediate     VANCOMYCIN <=0.5  SENSITIVE Sensitive     * 80,000 COLONIES/mL ENTEROCOCCUS FAECIUM  SARS Coronavirus 2 by RT PCR (hospital order, performed in [facility] hospital lab) Nasopharyngeal Nasopharyngeal Swab     Status: None   Collection Time: 09/13/19 12:21 AM   Specimen: Nasopharyngeal Swab  Result Value Ref Range Status   SARS Coronavirus 2 NEGATIVE NEGATIVE Final    Comment: (NOTE) SARS-CoV-2 target nucleic acids are NOT DETECTED. The SARS-CoV-2 RNA is generally detectable in upper and lower respiratory specimens during the acute phase of infection. The lowest concentration of SARS-CoV-2 viral copies this assay can detect is 250 copies / mL. A negative result does not preclude SARS-CoV-2 infection and should not be used as the sole basis for treatment or other patient management decisions.  A negative result may occur with improper specimen collection / handling, submission of specimen other than nasopharyngeal swab, presence of viral mutation(s) within the areas targeted by this assay, and inadequate number of viral copies (<250 copies / mL). A negative result must be combined with clinical observations, patient history, and epidemiological information. Fact Sheet for Patients:   StrictlyIdeas.no Fact Sheet for Healthcare Providers: BankingDealers.co.za This test is not yet approved or cleared  by the [place] FDA and has been authorized for detection and/or diagnosis of SARS-CoV-2 by FDA under an Emergency Use Authorization (EUA).  This EUA will remain in effect (meaning this test can be used) for the duration of the COVID-19 declaration under Section 564(b)(1) of the Act, 21 U.S.C. section 360bbb-3(b)(1),  unless the authorization is terminated or revoked sooner. Performed at [facility], [address], [place], [place] [address]   Culture, blood (routine x 2)     Status: None (Preliminary result)   Collection Time: 09/17/19  8:57 PM   Specimen: Right Antecubital; Blood  Result Value Ref Range Status   Specimen Description RIGHT ANTECUBITAL  Final   Special Requests   Final    BOTTLES DRAWN AEROBIC AND ANAEROBIC Blood Culture adequate volume   Culture   Final    NO GROWTH < 12 HOURS Performed at [facility], [address], [place], [place] [address]    Report Status PENDING  Incomplete  Culture, blood (routine x 2)     Status: None (Preliminary result)   Collection Time: 09/17/19  8:57 PM   Specimen: BLOOD RIGHT HAND  Result Value Ref Range Status   Specimen Description BLOOD RIGHT HAND  Final   Special Requests   Final    BOTTLES DRAWN AEROBIC AND ANAEROBIC Blood Culture adequate volume   Culture   Final    NO GROWTH < 12 HOURS Performed at [facility], [address] [place], [place] [address]    Report Status PENDING  Incomplete  Culture, blood (single)     Status: None (Preliminary result)   Collection Time: 09/18/19  9:09 AM   Specimen: Right Antecubital; Blood  Result Value Ref Range Status   Specimen Description   Final    RIGHT ANTECUBITAL BOTTLES DRAWN AEROBIC AND ANAEROBIC   Special Requests   Final    Blood Culture adequate volume Performed at [facility], [address], [place], [place] [address]    Culture PENDING  Incomplete   Report Status PENDING  Incomplete         Radiology Studies: No results found.      Scheduled Meds: . benazepril  10 mg Oral Daily  . calcium carbonate  1 tablet Oral BID WC  . cholecalciferol  1,000  Units Oral Daily  . donepezil  5 mg Oral QHS  . furosemide  20 mg Oral Daily  . heparin  5,000 Units Subcutaneous Q8H  . loratadine  10 mg Oral Daily  . melatonin  3 mg Oral QHS  . pantoprazole  40 mg Oral Daily  .  PARoxetine  30 mg Oral q morning - 10a  . vitamin B-12  1,000 mcg Oral Daily   Continuous Infusions: . vancomycin 750 mg (09/18/19 1135)     LOS: 1 day    Time spent: 30 minutes    Erick Blinks, MD [facility]   If 7PM-7AM, please contact night-coverage [email or web address]  09/18/2019, 8:48 PM

## 2019-09-18 NOTE — Progress Notes (Signed)
Pt's port a cath was accessed successfully, cath flushed. Cath does not provide blood return. MD made aware of situation as this RN cannot pull of cath for labs. No new orders placed at this time. Will continue to monitor patient.

## 2019-09-19 LAB — CULTURE, BLOOD (ROUTINE X 2)
Special Requests: ADEQUATE
Special Requests: ADEQUATE

## 2019-09-19 MED ORDER — HEPARIN SOD (PORK) LOCK FLUSH 100 UNIT/ML IV SOLN
10.0000 [IU] | Freq: Once | INTRAVENOUS | Status: AC
  Administered 2019-09-19: 10 [IU] via INTRAVENOUS
  Filled 2019-09-19: qty 5

## 2019-09-19 NOTE — Plan of Care (Signed)

## 2019-09-19 NOTE — Discharge Summary (Signed)
Physician Discharge Summary  Megan Fisher EAV:[id number] DOB: Oct 09, 1928 DOA: 09/17/2019  PCP: Mechele Claude, MD  Admit date: 09/17/2019 Discharge date: 09/19/2019  Admitted From: Home Disposition: Home  Recommendations for Outpatient Follow-up:  1. Follow up with PCP in 1-2 weeks 2. Please obtain BMP/CBC in one week 3. At family's request, patient was discharged from community hospice and was set up with home health for in-home care  Home Health: Home health RN, aide Equipment/Devices: Hospital bed, [facility] lift, wheelchair  Discharge Condition: Stable CODE STATUS: DNR Diet recommendation: Heart healthy  Brief/Interim Summary: HPI: [name] a65 y.o.female,with medical history significant ofhypertension, CVA with residual left-sided weakness, anxiety, recurrent UTI, obesity and newly diagnosed dementia, patient with recent hospitalization for increased confusion and weakness, her work-up was significant for Enterococcus UTI, she was treated with fosfomycin during hospital stay, and discharged on oral nitrofurantoin, after her discharge patient was noted to have blood culture growing gram-positive cocci, 1 out of 2 sets in each of her Port-A-Cath and peripheral cultures, this was discussed with ID, who recommended patient to be directly admitted to the hospital for IV vancomycin, pending final sensitivity and specifically on those cultures, patient lives at home, with 24-hour caregiver, she is bedbound at baseline, using wheelchair intermittently, was discharged, there is no fever, chills, or worsening encephalopathy or confusion, patient does report some swelling occasionally, she denies any at her Port-A-Cath site, cough, fever or chills.  Discharge Diagnoses:  Active Problems:   Hypertension   Dementia (HCC)   History of CVA (cerebrovascular accident)   DNR (do not resuscitate)   Bacteremia  Bacteremia -Patient returned to the hospital since she was found to have positive  blood cultures after her last discharge for [facility] in both sets of cultures.  She was started on vancomycin empirically until further culture results are available.  After discussing with microbiology lab, it was noted that both cultures were found to have diphtheroids which was felt to be contaminant.  Repeat cultures that were drawn on patient's readmission did not show any growth.  Case was reviewed with infectious disease, Dr. Algis Liming who agreed that this was likely a contaminant and no further work-up was necessary.  Patient was discharged home in stable condition.  Dementia/anxiety/depression -Continue with home medication  GERD -Continue with PPI  Hypertension -Continue with benazepril  Discharge Instructions  Discharge Instructions    Diet - low sodium heart healthy   Complete by: As directed    Increase activity slowly   Complete by: As directed      Allergies as of 09/19/2019      Reactions   Propoxyphene Anaphylaxis   Ciprofloxacin Diarrhea   Citalopram Other (See Comments)   Reaction is unknown   Codeine Other (See Comments)   Altered mental status "Makes Crazy"   Latex    Levaquin [levofloxacin In D5w] Diarrhea   Oxycontin [oxycodone Hcl] Other (See Comments)   Altered mental status   Seroquel [quetiapine] Other (See Comments)   Hallucinations    Sulfa Antibiotics Swelling   Ultram [tramadol]    AMS   Zoloft [sertraline Hcl] Other (See Comments)   Adhesive [tape] Itching, Swelling, Rash   Paper tape is ok   Aspirin Rash   Penicillins Swelling, Rash      Medication List    STOP taking these medications   cephALEXin 250 MG capsule Commonly known as: KEFLEX   nitrofurantoin 50 MG capsule Commonly known as: MACRODANTIN     TAKE these medications   benazepril  10 MG tablet Commonly known as: LOTENSIN One tab by mouth daily. Needs appointment for future refills. What changed:   how much to take  how to take this  when to take this  additional  instructions   CAL-MAG-ZINC PO Take 1 tablet by mouth in the morning.   calcium carbonate 1250 (500 Ca) MG tablet Commonly known as: OS-CAL - dosed in mg of elemental calcium Take 1 tablet by mouth 2 (two) times daily with a meal.   cholecalciferol 1000 units tablet Commonly known as: VITAMIN D Take 2,000 Units by mouth daily.   Cranberry 450 MG Caps Take 1 capsule by mouth at bedtime.   diclofenac sodium 1 % Gel Commonly known as: VOLTAREN Apply 2 g topically 4 (four) times daily as needed (for pain).   donepezil 5 MG tablet Commonly known as: Aricept Take 1 tablet (5 mg total) by mouth at bedtime.   furosemide 20 MG tablet Commonly known as: LASIX TAKE 1 TABLET DAILY   ipratropium-albuterol 0.5-2.5 (3) MG/3ML Soln Commonly known as: DUONEB Inhale 3 mLs into the lungs every 6 (six) hours as needed.   loperamide 2 MG tablet Commonly known as: Imodium A-D Take 1 tablet (2 mg total) by mouth 4 (four) times daily as needed for diarrhea or loose stools.   LORazepam 0.5 MG tablet Commonly known as: ATIVAN Take 1 tablet (0.5 mg total) by mouth at bedtime.   meclizine 12.5 MG tablet Commonly known as: ANTIVERT TAKE 1 TABLET TWICE A DAY AS NEEDED FOR DIZZINESS What changed: See the new instructions.   melatonin 3 MG Tabs tablet Take 3 mg by mouth at bedtime.   omeprazole 20 MG capsule Commonly known as: PRILOSEC One cap by mouth daily. Needs appointment for future refills. What changed:   how much to take  how to take this  when to take this  additional instructions   OXYGEN Inhale 2 L into the lungs continuous.   PARoxetine 20 MG tablet Commonly known as: PAXIL TAKE 1 TABLET IN MORNING What changed:   how much to take  how to take this  when to take this  additional instructions   promethazine 25 MG tablet Commonly known as: PHENERGAN Take 25 mg by mouth every 6 (six) hours as needed for nausea or vomiting.   vitamin B-12 1000 MCG  tablet Commonly known as: CYANOCOBALAMIN Take 1,000 mcg by mouth daily.            Durable Medical Equipment  (From admission, onward)         Start     Ordered   09/19/19 1435  For home use only DME oxygen  Once    Question Answer Comment  Length of Need Lifetime   Liters per Minute 2   Frequency Continuous (stationary and portable oxygen unit needed)   Oxygen conserving device Yes   Oxygen delivery system Gas      09/19/19 1438   09/19/19 1422  For home use only DME standard manual wheelchair with seat cushion  Once    Comments: Patient suffers from arthritis and prior stroke which impairs their ability to perform daily activities like ambulation in the home.  A walker will not resolve issue with performing activities of daily living. A wheelchair will allow patient to safely perform daily activities. Patient can safely propel the wheelchair in the home or has a caregiver who can provide assistance. Length of need lifetime. Accessories: elevating leg rests (ELRs), wheel locks, extensions and anti-tippers.  22 inch seat   09/19/19 1438   09/19/19 1420  For home use only DME Hospital bed  Once    Question Answer Comment  Length of Need Lifetime   Patient has (list medical condition): CVA, weakness and arthritis   Bed type Semi-electric   Hoyer Lift Yes      09/19/19 1438          Allergies  Allergen Reactions  . Propoxyphene Anaphylaxis  . Ciprofloxacin Diarrhea  . Citalopram Other (See Comments)    Reaction is unknown  . Codeine Other (See Comments)    Altered mental status "Makes Crazy"  . Latex   . Levaquin [Levofloxacin In D5w] Diarrhea  . Oxycontin [Oxycodone Hcl] Other (See Comments)    Altered mental status  . Seroquel [Quetiapine] Other (See Comments)    Hallucinations   . Sulfa Antibiotics Swelling  . Ultram [Tramadol]     AMS  . Zoloft [Sertraline Hcl] Other (See Comments)  . Adhesive [Tape] Itching, Swelling and Rash    Paper tape is ok  .  Aspirin Rash  . Penicillins Swelling and Rash    Consultations:  Infectious disease, (phone)   Procedures/Studies: DG Chest Port 1 View  Result Date: 09/12/2019 CLINICAL DATA:  Urinary tract infection, weakness EXAM: PORTABLE CHEST 1 VIEW COMPARISON:  03/30/2019 FINDINGS: Single frontal view of the chest demonstrates a stable cardiac silhouette. No airspace disease, effusion, or pneumothorax. Right chest wall port unchanged. IMPRESSION: 1. Stable exam, no acute process. Electronically Signed   By: Sharlet Salina M.D.   On: 09/12/2019 19:41       Subjective: Confused, pleasant  Discharge Exam: Vitals:   09/18/19 1955 09/18/19 2113 09/19/19 0641 09/19/19 1507  BP:  107/65 (!) 132/95 103/64  Pulse:  73 76 75  Resp:  18 16 20   Temp:  99.1 F (37.3 C)  98.5 F (36.9 C)  TempSrc:  Oral    SpO2: 93% 99% 95% 98%    General: Pt is alert, awake, not in acute distress Cardiovascular: RRR, S1/S2 +, no rubs, no gallops Respiratory: CTA bilaterally, no wheezing, no rhonchi Abdominal: Soft, NT, ND, bowel sounds + Extremities: no edema, no cyanosis    The results of significant diagnostics from this hospitalization (including imaging, microbiology, ancillary and laboratory) are listed below for reference.     Microbiology: Recent Results (from the past 240 hour(s))  Blood Culture (routine x 2)     Status: Abnormal   Collection Time: 09/12/19  8:00 PM   Specimen: Porta Cath; Blood  Result Value Ref Range Status   Specimen Description   Final    PORTA CATH COLLECTED BY DOCTOR Performed at [facility], [address], [place], [place]    Special Requests   Final    BOTTLES DRAWN AEROBIC AND ANAEROBIC Blood Culture adequate volume Performed at [facility], [address] [place], [place]    Culture  Setup Time   Final    CORRECTED RESULTS GRAM POSITIVE RODS AEROBIC BOTTLE ONLY Gram Stain Report Called to,Read Back By and Verified With: DR. [id number] AT Kerry Hough  ON [id number] BY THOMPSON S. PREVIOUSLY REPORTED AS: GRAM POSITIVE COCCI CORRECTED RESULTS CALLED TO: DR Alakai Macbride [id number] AT 817 AM BY CM    Culture (A)  Final    DIPHTHEROIDS(CORYNEBACTERIUM SPECIES) Standardized susceptibility testing for this organism is not available. Performed at [facility], [address], [place], [place]    Report Status 09/19/2019 FINAL  Final  Blood Culture (routine x 2)     Status: Abnormal   Collection Time: 09/12/19  8:00 PM   Specimen: BLOOD RIGHT HAND  Result Value Ref Range Status   Specimen Description   Final    BLOOD RIGHT HAND Performed at [facility], [address] [place], [place] [address]    Special Requests   Final    BOTTLES DRAWN AEROBIC AND ANAEROBIC Blood Culture adequate volume Performed at [facility], [address], [place], [place] [address]    Culture  Setup Time   Final    Performed at [place] RODS Gram Stain Report Called to,Read Back By and Verified With: LAMA,DR AT 2255 ON 5.26.21 BY ISLEY,B PREVIOUSLY REPORTED AS: GRAM POSITIVE COCCI CORRECTED RESULTS CALLED TO: DR Shawnell Dykes [id number] AT 812 AM BY CM    Culture (A)  Final    DIPHTHEROIDS(CORYNEBACTERIUM SPECIES) CORRECTED ON 05/28 AT 0800: PREVIOUSLY REPORTED AS NO GROWTH 5 DAYS Standardized susceptibility testing for this organism is not available. Performed at [facility], [place] [address], [place], [place] [address]    Report Status 09/19/2019 FINAL  Final  Blood Culture ID Panel (Reflexed)     Status: None   Collection Time: 09/12/19  8:00 PM  Result Value Ref Range Status   Enterococcus species NOT DETECTED NOT DETECTED Final   Listeria monocytogenes NOT DETECTED NOT DETECTED Final   Staphylococcus species NOT DETECTED NOT DETECTED Final   Staphylococcus aureus (BCID) NOT DETECTED NOT DETECTED Final   Streptococcus species NOT DETECTED NOT DETECTED Final   Streptococcus agalactiae NOT DETECTED NOT DETECTED  Final   Streptococcus pneumoniae NOT DETECTED NOT DETECTED Final   Streptococcus pyogenes NOT DETECTED NOT DETECTED Final   Acinetobacter baumannii NOT DETECTED NOT DETECTED Final   Enterobacteriaceae species NOT DETECTED NOT DETECTED Final   Enterobacter cloacae complex NOT DETECTED NOT DETECTED Final   Escherichia coli NOT DETECTED NOT DETECTED Final   Klebsiella oxytoca NOT DETECTED NOT DETECTED Final   Klebsiella pneumoniae NOT DETECTED NOT DETECTED Final   Proteus species NOT DETECTED NOT DETECTED Final   Serratia marcescens NOT DETECTED NOT DETECTED Final   Haemophilus influenzae NOT DETECTED NOT DETECTED Final   Neisseria meningitidis NOT DETECTED NOT DETECTED Final   Pseudomonas aeruginosa NOT DETECTED NOT DETECTED Final   Candida albicans NOT DETECTED NOT DETECTED Final   Candida glabrata NOT DETECTED NOT DETECTED Final   Candida krusei NOT DETECTED NOT DETECTED Final   Candida parapsilosis NOT DETECTED NOT DETECTED Final   Candida tropicalis NOT DETECTED NOT DETECTED Final    Comment: Performed at [facility], [place] [address], [place], [place] [address]  Urine culture     Status: Abnormal   Collection Time: 09/12/19  8:18 PM   Specimen: In/Out Cath Urine  Result Value Ref Range Status   Specimen Description   Final    IN/OUT CATH URINE Performed at [facility], [address] [place], [place] [address]    Special Requests   Final    NONE Performed at [facility], [address] [place], [place] [address]    Culture 80,000 COLONIES/mL ENTEROCOCCUS FAECIUM (A)  Final   Report Status 09/15/2019 FINAL  Final   Organism ID, Bacteria ENTEROCOCCUS FAECIUM (A)  Final      Susceptibility   Enterococcus faecium - MIC*    AMPICILLIN >=32 RESISTANT Resistant     NITROFURANTOIN 64 INTERMEDIATE Intermediate     VANCOMYCIN <=  0.5 SENSITIVE Sensitive     * 80,000 COLONIES/mL ENTEROCOCCUS FAECIUM  SARS Coronavirus 2 by RT PCR (hospital order, performed in [facility] hospital  lab) Nasopharyngeal Nasopharyngeal Swab     Status: None   Collection Time: 09/13/19 12:21 AM   Specimen: Nasopharyngeal Swab  Result Value Ref Range Status   SARS Coronavirus 2 NEGATIVE NEGATIVE Final    Comment: (NOTE) SARS-CoV-2 target nucleic acids are NOT DETECTED. The SARS-CoV-2 RNA is generally detectable in upper and lower respiratory specimens during the acute phase of infection. The lowest concentration of SARS-CoV-2 viral copies this assay can detect is 250 copies / mL. A negative result does not preclude SARS-CoV-2 infection and should not be used as the sole basis for treatment or other patient management decisions.  A negative result may occur with improper specimen collection / handling, submission of specimen other than nasopharyngeal swab, presence of viral mutation(s) within the areas targeted by this assay, and inadequate number of viral copies (<250 copies / mL). A negative result must be combined with clinical observations, patient history, and epidemiological information. Fact Sheet for Patients:   BoilerBrush.com.cy Fact Sheet for Healthcare Providers: https://pope.com/ This test is not yet approved or cleared  by the [place] FDA and has been authorized for detection and/or diagnosis of SARS-CoV-2 by FDA under an Emergency Use Authorization (EUA).  This EUA will remain in effect (meaning this test can be used) for the duration of the COVID-19 declaration under Section 564(b)(1) of the Act, 21 U.S.C. section 360bbb-3(b)(1), unless the authorization is terminated or revoked sooner. Performed at [facility], [address] [place], [place] [address]   Culture, blood (routine x 2)     Status: None (Preliminary result)   Collection Time: 09/17/19  8:57 PM   Specimen: Right Antecubital; Blood  Result Value Ref Range Status   Specimen Description RIGHT ANTECUBITAL  Final   Special Requests   Final    BOTTLES  DRAWN AEROBIC AND ANAEROBIC Blood Culture adequate volume   Culture   Final    NO GROWTH 2 DAYS Performed at [facility], [address], [place], [place] [address]    Report Status PENDING  Incomplete  Culture, blood (routine x 2)     Status: None (Preliminary result)   Collection Time: 09/17/19  8:57 PM   Specimen: BLOOD RIGHT HAND  Result Value Ref Range Status   Specimen Description BLOOD RIGHT HAND  Final   Special Requests   Final    BOTTLES DRAWN AEROBIC AND ANAEROBIC Blood Culture adequate volume   Culture   Final    NO GROWTH 2 DAYS Performed at [facility], [address] [place], [place] [address]    Report Status PENDING  Incomplete  Culture, blood (single)     Status: None (Preliminary result)   Collection Time: 09/18/19  9:09 AM   Specimen: Right Antecubital; Blood  Result Value Ref Range Status   Specimen Description   Final    RIGHT ANTECUBITAL BOTTLES DRAWN AEROBIC AND ANAEROBIC   Special Requests Blood Culture adequate volume  Final   Culture   Final    NO GROWTH 1 DAY Performed at [facility], [address], [place], [place] [address]    Report Status PENDING  Incomplete     Labs: BNP (last 3 results) Recent Labs    09/21/18 1405 03/30/19 1537 09/10/19 1644  BNP 151.0* 506.0* 217.0*   Basic Metabolic Panel: Recent Labs  Lab 09/13/19 0620 09/14/19 0537 09/17/19  2056 09/18/19 0909  NA 138 141 138 139  K 3.2* 3.8 4.0 3.9  CL 100 102 101 102  CO2 30 31 28 30   GLUCOSE 89 87 98 91  BUN 11 11 9 11   CREATININE 0.73 0.61 0.67 0.60  CALCIUM 8.0* 8.2* 7.9* 8.2*  MG  --  1.8  --   --    Liver Function Tests: Recent Labs  Lab 09/13/19 0620 09/17/19 2056  AST 23 26  ALT 16 15  ALKPHOS 69 64  BILITOT 0.8 0.7  PROT 5.2* 5.2*  ALBUMIN 2.0* 1.9*   No results for input(s): LIPASE, AMYLASE in the last 168 hours. No results for input(s): AMMONIA in the last 168 hours. CBC: Recent Labs  Lab 09/13/19 0620 09/14/19 0537 09/17/19 2056  09/18/19 0909  WBC 8.1 7.7 6.8 5.7  HGB 11.5* 10.8* 11.5* 11.0*  HCT 35.7* 34.6* 36.1 35.2*  MCV 98.9 98.9 98.6 99.4  PLT 261 217 258 202   Cardiac Enzymes: No results for input(s): CKTOTAL, CKMB, CKMBINDEX, TROPONINI in the last 168 hours. BNP: Invalid input(s): POCBNP CBG: No results for input(s): GLUCAP in the last 168 hours. D-Dimer No results for input(s): DDIMER in the last 72 hours. Hgb A1c No results for input(s): HGBA1C in the last 72 hours. Lipid Profile No results for input(s): CHOL, HDL, LDLCALC, TRIG, CHOLHDL, LDLDIRECT in the last 72 hours. Thyroid function studies No results for input(s): TSH, T4TOTAL, T3FREE, THYROIDAB in the last 72 hours.  Invalid input(s): FREET3 Anemia work up No results for input(s): VITAMINB12, FOLATE, FERRITIN, TIBC, IRON, RETICCTPCT in the last 72 hours. Urinalysis    Component Value Date/Time   COLORURINE YELLOW 09/12/2019 2018   APPEARANCEUR HAZY (A) 09/12/2019 2018   LABSPEC 1.011 09/12/2019 2018   PHURINE 7.0 09/12/2019 2018   GLUCOSEU NEGATIVE 09/12/2019 2018   HGBUR NEGATIVE 09/12/2019 2018   BILIRUBINUR NEGATIVE 09/12/2019 2018   BILIRUBINUR neg 10/31/2014 1645   KETONESUR NEGATIVE 09/12/2019 2018   PROTEINUR NEGATIVE 09/12/2019 2018   UROBILINOGEN negative 10/31/2014 1645   UROBILINOGEN 0.2 10/06/2014 1920   NITRITE NEGATIVE 09/12/2019 2018   LEUKOCYTESUR MODERATE (A) 09/12/2019 2018   Sepsis Labs Invalid input(s): PROCALCITONIN,  WBC,  LACTICIDVEN Microbiology Recent Results (from the past 240 hour(s))  Blood Culture (routine x 2)     Status: Abnormal   Collection Time: 09/12/19  8:00 PM   Specimen: Porta Cath; Blood  Result Value Ref Range Status   Specimen Description   Final    PORTA CATH COLLECTED BY DOCTOR Performed at [facility], [address] [place], [place] [address]    Special Requests   Final    BOTTLES DRAWN AEROBIC AND ANAEROBIC Blood Culture adequate volume Performed at [facility],  [address] [place], [place] [address]    Culture  Setup Time   Final    CORRECTED RESULTS GRAM POSITIVE RODS AEROBIC BOTTLE ONLY Gram Stain Report Called to,Read Back By and Verified With: DR. Kerry HoughMEMON AT [id number] ON [id number] BY THOMPSON S. PREVIOUSLY REPORTED AS: GRAM POSITIVE COCCI CORRECTED RESULTS CALLED TO: DR Brix Brearley [phone] AT 817 AM BY CM    Culture (A)  Final    DIPHTHEROIDS(CORYNEBACTERIUM SPECIES) Standardized susceptibility testing for this organism is not available. Performed at [facility], [address] [place], [place] [address]    Report Status 09/19/2019 FINAL  Final  Blood Culture (routine x 2)     Status: Abnormal   Collection Time: 09/12/19  8:00 PM  Specimen: BLOOD RIGHT HAND  Result Value Ref Range Status   Specimen Description   Final    BLOOD RIGHT HAND Performed at [facility], [address] [place], [place] [address]    Special Requests   Final    BOTTLES DRAWN AEROBIC AND ANAEROBIC Blood Culture adequate volume Performed at [facility], [address] [place], [place] [address]    Culture  Setup Time   Final    Performed at [facility] CORRECTED RESULTS GRAM POSITIVE RODS Gram Stain Report Called to,Read Back By and Verified With: LAMA,DR AT 2255 ON 5.26.21 BY ISLEY,B PREVIOUSLY REPORTED AS: GRAM POSITIVE COCCI CORRECTED RESULTS CALLED TO: DR Shericka Johnstone [id number] AT 812 AM BY CM    Culture (A)  Final    DIPHTHEROIDS(CORYNEBACTERIUM SPECIES) CORRECTED ON 05/28 AT 0800: PREVIOUSLY REPORTED AS NO GROWTH 5 DAYS Standardized susceptibility testing for this organism is not available. Performed at [facility], [address], [place], [place] [address]    Report Status 09/19/2019 FINAL  Final  Blood Culture ID Panel (Reflexed)     Status: None   Collection Time: 09/12/19  8:00 PM  Result Value Ref Range Status   Enterococcus species NOT DETECTED NOT DETECTED Final   Listeria monocytogenes NOT DETECTED NOT DETECTED Final   Staphylococcus species  NOT DETECTED NOT DETECTED Final   Staphylococcus aureus (BCID) NOT DETECTED NOT DETECTED Final   Streptococcus species NOT DETECTED NOT DETECTED Final   Streptococcus agalactiae NOT DETECTED NOT DETECTED Final   Streptococcus pneumoniae NOT DETECTED NOT DETECTED Final   Streptococcus pyogenes NOT DETECTED NOT DETECTED Final   Acinetobacter baumannii NOT DETECTED NOT DETECTED Final   Enterobacteriaceae species NOT DETECTED NOT DETECTED Final   Enterobacter cloacae complex NOT DETECTED NOT DETECTED Final   Escherichia coli NOT DETECTED NOT DETECTED Final   Klebsiella oxytoca NOT DETECTED NOT DETECTED Final   Klebsiella pneumoniae NOT DETECTED NOT DETECTED Final   Proteus species NOT DETECTED NOT DETECTED Final   Serratia marcescens NOT DETECTED NOT DETECTED Final   Haemophilus influenzae NOT DETECTED NOT DETECTED Final   Neisseria meningitidis NOT DETECTED NOT DETECTED Final   Pseudomonas aeruginosa NOT DETECTED NOT DETECTED Final   Candida albicans NOT DETECTED NOT DETECTED Final   Candida glabrata NOT DETECTED NOT DETECTED Final   Candida krusei NOT DETECTED NOT DETECTED Final   Candida parapsilosis NOT DETECTED NOT DETECTED Final   Candida tropicalis NOT DETECTED NOT DETECTED Final    Comment: Performed at [facility], [address], [place], [place] [address]  Urine culture     Status: Abnormal   Collection Time: 09/12/19  8:18 PM   Specimen: In/Out Cath Urine  Result Value Ref Range Status   Specimen Description   Final    IN/OUT CATH URINE Performed at [facility], [address], [place], [place] [address]    Special Requests   Final    NONE Performed at [facility], [address] [place], [place] [address]    Culture 80,000 COLONIES/mL ENTEROCOCCUS FAECIUM (A)  Final   Report Status 09/15/2019 FINAL  Final   Organism ID, Bacteria ENTEROCOCCUS FAECIUM (A)  Final      Susceptibility   Enterococcus faecium - MIC*    AMPICILLIN >=32 RESISTANT Resistant      NITROFURANTOIN 64 INTERMEDIATE Intermediate     VANCOMYCIN <=0.5 SENSITIVE Sensitive     * 80,000 COLONIES/mL ENTEROCOCCUS FAECIUM  SARS Coronavirus 2 by RT PCR (hospital order, performed  in [facility] hospital lab) Nasopharyngeal Nasopharyngeal Swab     Status: None   Collection Time: 09/13/19 12:21 AM   Specimen: Nasopharyngeal Swab  Result Value Ref Range Status   SARS Coronavirus 2 NEGATIVE NEGATIVE Final    Comment: (NOTE) SARS-CoV-2 target nucleic acids are NOT DETECTED. The SARS-CoV-2 RNA is generally detectable in upper and lower respiratory specimens during the acute phase of infection. The lowest concentration of SARS-CoV-2 viral copies this assay can detect is 250 copies / mL. A negative result does not preclude SARS-CoV-2 infection and should not be used as the sole basis for treatment or other patient management decisions.  A negative result may occur with improper specimen collection / handling, submission of specimen other than nasopharyngeal swab, presence of viral mutation(s) within the areas targeted by this assay, and inadequate number of viral copies (<250 copies / mL). A negative result must be combined with clinical observations, patient history, and epidemiological information. Fact Sheet for Patients:   BoilerBrush.com.cy Fact Sheet for Healthcare Providers: https://pope.com/ This test is not yet approved or cleared  by the [place] FDA and has been authorized for detection and/or diagnosis of SARS-CoV-2 by FDA under an Emergency Use Authorization (EUA).  This EUA will remain in effect (meaning this test can be used) for the duration of the COVID-19 declaration under Section 564(b)(1) of the Act, 21 U.S.C. section 360bbb-3(b)(1), unless the authorization is terminated or revoked sooner. Performed at [facility], [address] [place], [place] [address]   Culture, blood (routine x 2)     Status: None  (Preliminary result)   Collection Time: 09/17/19  8:57 PM   Specimen: Right Antecubital; Blood  Result Value Ref Range Status   Specimen Description RIGHT ANTECUBITAL  Final   Special Requests   Final    BOTTLES DRAWN AEROBIC AND ANAEROBIC Blood Culture adequate volume   Culture   Final    NO GROWTH 2 DAYS Performed at [facility], [address] [place], [place] [address]    Report Status PENDING  Incomplete  Culture, blood (routine x 2)     Status: None (Preliminary result)   Collection Time: 09/17/19  8:57 PM   Specimen: BLOOD RIGHT HAND  Result Value Ref Range Status   Specimen Description BLOOD RIGHT HAND  Final   Special Requests   Final    BOTTLES DRAWN AEROBIC AND ANAEROBIC Blood Culture adequate volume   Culture   Final    NO GROWTH 2 DAYS Performed at [facility], [address] [place], [place] [address]    Report Status PENDING  Incomplete  Culture, blood (single)     Status: None (Preliminary result)   Collection Time: 09/18/19  9:09 AM   Specimen: Right Antecubital; Blood  Result Value Ref Range Status   Specimen Description   Final    RIGHT ANTECUBITAL BOTTLES DRAWN AEROBIC AND ANAEROBIC   Special Requests Blood Culture adequate volume  Final   Culture   Final    NO GROWTH 1 DAY Performed at [facility], [address] [place], [place] [address]    Report Status PENDING  Incomplete     Time coordinating discharge:  SIGNED:   Erick Blinks, MD  [facility] 09/19/2019, 8:35 PM   If 7PM-7AM, please contact night-coverage [email or web address]

## 2019-09-19 NOTE — Care Management (Signed)
Patient requires frequent re-positioning of the body in ways that cannot be achieved with an ordinary bed or wedge pillow, to eliminate pain, reduce pressure, and the head of the bed to be elevated more than 30 degrees most of the time due to CVA, weakness, and arthritis.

## 2019-09-19 NOTE — Care Management (Signed)
Patient has mobility limitations that impair their ability to do one or more mobility-related activities of daily living in customary locations in the home. Patient cannot use crutches, cane, or walker to resolve the issue sufficiently, patient can safely self propel the wheelchair in the home or has a caregiver that can assist.

## 2019-09-19 NOTE — TOC Transition Note (Signed)
Transition of Care [facility]) - CM/SW Discharge Note  Patient Details  Name: Megan Fisher MRN: [id number] Date of Birth: 11-28-1928  Transition of Care [facility]) CM/SW Contact:  [name], LCSW Phone Number: 09/19/2019, 3:17 PM  Clinical Narrative: CSW notified by hospitalist that patient's family wants the patient to discharge home with [facility] and DME rather than hospice services. CSW called patient's daughter, [name], to discuss needs. Daughter reported the patient will need a hospital bed, Hoyer lift, home O2, and wheelchair as patient is currently being supplied this DME through [facility]. Daughter agreeable to referral to [facility]. CSW asked about [facility] providers. Daughter requested [name].  CSW called [name] with [facility] for DME referral. CSW updated by RN that patient did not meet O2 sat requirements, so patient is not eligible for home O2 per Medicare guidelines. [facility] updated. CSW called [facility] and spoke with on-call RN, [name], to discuss family wanting patient to discharge from hospice. [name] explained patient will have to discharge from hospice as she is in the hospital. CSW updated daughter and also explained patient does not meet home O2 criteria. CSW did not receive response from [facility]. HH referral made to [facility] with [facility]. CSW left voicemail for daughter updating her regarding the [facility] referral.  Final next level of care: Home w Home Health Services Barriers to Discharge: Barriers Resolved  Patient Goals and CMS Choice Patient states their goals for this hospitalization and ongoing recovery are:: Discharge home with home health; discharge from hospice CMS Medicare.gov Compare Post Acute Care list provided to:: Patient Represenative (must comment)([name] (daughter)) Choice offered to / list presented to : Adult Children  Discharge Plan and Services         DME Arranged: Wheelchair manual, Hospital bed, Other see comment(Hoyer lift) DME Agency: [facility] Date DME  Agency Contacted: 09/19/19 Time DME Agency Contacted: [id number] Representative spoke with at DME Agency: [name] HH Arranged: RN, PT, Nurse's Aide HH Agency: [facility] Date [facility] Agency Contacted: 09/19/19  Readmission Risk Interventions No flowsheet data found.

## 2019-09-21 ENCOUNTER — Telehealth: Payer: Self-pay | Admitting: Family Medicine

## 2019-09-21 NOTE — Telephone Encounter (Signed)
Called twice, busy signal both times

## 2019-09-22 ENCOUNTER — Telehealth: Payer: Self-pay

## 2019-09-22 ENCOUNTER — Ambulatory Visit: Admitting: Family Medicine

## 2019-09-22 DIAGNOSIS — F419 Anxiety disorder, unspecified: Secondary | ICD-10-CM | POA: Diagnosis not present

## 2019-09-22 DIAGNOSIS — G9341 Metabolic encephalopathy: Secondary | ICD-10-CM | POA: Diagnosis not present

## 2019-09-22 DIAGNOSIS — Z9981 Dependence on supplemental oxygen: Secondary | ICD-10-CM | POA: Diagnosis not present

## 2019-09-22 DIAGNOSIS — L89151 Pressure ulcer of sacral region, stage 1: Secondary | ICD-10-CM | POA: Diagnosis not present

## 2019-09-22 DIAGNOSIS — N390 Urinary tract infection, site not specified: Secondary | ICD-10-CM | POA: Diagnosis not present

## 2019-09-22 DIAGNOSIS — Z87440 Personal history of urinary (tract) infections: Secondary | ICD-10-CM | POA: Diagnosis not present

## 2019-09-22 DIAGNOSIS — M1990 Unspecified osteoarthritis, unspecified site: Secondary | ICD-10-CM | POA: Diagnosis not present

## 2019-09-22 DIAGNOSIS — B9689 Other specified bacterial agents as the cause of diseases classified elsewhere: Secondary | ICD-10-CM | POA: Diagnosis not present

## 2019-09-22 DIAGNOSIS — Z7401 Bed confinement status: Secondary | ICD-10-CM | POA: Diagnosis not present

## 2019-09-22 DIAGNOSIS — R7881 Bacteremia: Secondary | ICD-10-CM | POA: Diagnosis not present

## 2019-09-22 DIAGNOSIS — F329 Major depressive disorder, single episode, unspecified: Secondary | ICD-10-CM | POA: Diagnosis not present

## 2019-09-22 DIAGNOSIS — I10 Essential (primary) hypertension: Secondary | ICD-10-CM | POA: Diagnosis not present

## 2019-09-22 DIAGNOSIS — Z792 Long term (current) use of antibiotics: Secondary | ICD-10-CM | POA: Diagnosis not present

## 2019-09-22 DIAGNOSIS — F0390 Unspecified dementia without behavioral disturbance: Secondary | ICD-10-CM | POA: Diagnosis not present

## 2019-09-22 DIAGNOSIS — J9611 Chronic respiratory failure with hypoxia: Secondary | ICD-10-CM | POA: Diagnosis not present

## 2019-09-22 DIAGNOSIS — K219 Gastro-esophageal reflux disease without esophagitis: Secondary | ICD-10-CM | POA: Diagnosis not present

## 2019-09-22 DIAGNOSIS — K469 Unspecified abdominal hernia without obstruction or gangrene: Secondary | ICD-10-CM | POA: Diagnosis not present

## 2019-09-22 DIAGNOSIS — Z7901 Long term (current) use of anticoagulants: Secondary | ICD-10-CM | POA: Diagnosis not present

## 2019-09-22 DIAGNOSIS — B952 Enterococcus as the cause of diseases classified elsewhere: Secondary | ICD-10-CM | POA: Diagnosis not present

## 2019-09-22 DIAGNOSIS — R197 Diarrhea, unspecified: Secondary | ICD-10-CM | POA: Diagnosis not present

## 2019-09-22 DIAGNOSIS — I69354 Hemiplegia and hemiparesis following cerebral infarction affecting left non-dominant side: Secondary | ICD-10-CM | POA: Diagnosis not present

## 2019-09-22 DIAGNOSIS — I959 Hypotension, unspecified: Secondary | ICD-10-CM | POA: Diagnosis not present

## 2019-09-22 DIAGNOSIS — E669 Obesity, unspecified: Secondary | ICD-10-CM | POA: Diagnosis not present

## 2019-09-22 LAB — CULTURE, BLOOD (ROUTINE X 2)
Culture: NO GROWTH
Culture: NO GROWTH
Special Requests: ADEQUATE
Special Requests: ADEQUATE

## 2019-09-22 NOTE — Telephone Encounter (Signed)
Use keflex

## 2019-09-22 NOTE — Telephone Encounter (Signed)
Tina with [facility] called to clarify which preventative antibiotic patient should be taking. She has two. One is keflex 250mg  and the other macrobid 50mg . Please advise

## 2019-09-22 NOTE — Telephone Encounter (Signed)
Patient had an appointment for today but cancelled.  Please advise on request for items.  Does patient need a face to face to get these ordered?

## 2019-09-22 NOTE — Telephone Encounter (Signed)
Pt. Needs to be seen for this. Thanks, WS 

## 2019-09-22 NOTE — Telephone Encounter (Signed)
Patient has a follow up appointment scheduled. 

## 2019-09-23 DIAGNOSIS — F0281 Dementia in other diseases classified elsewhere with behavioral disturbance: Secondary | ICD-10-CM | POA: Diagnosis not present

## 2019-09-23 DIAGNOSIS — I5032 Chronic diastolic (congestive) heart failure: Secondary | ICD-10-CM | POA: Diagnosis not present

## 2019-09-23 DIAGNOSIS — R197 Diarrhea, unspecified: Secondary | ICD-10-CM | POA: Diagnosis not present

## 2019-09-23 DIAGNOSIS — L89890 Pressure ulcer of other site, unstageable: Secondary | ICD-10-CM | POA: Diagnosis not present

## 2019-09-23 DIAGNOSIS — M4800 Spinal stenosis, site unspecified: Secondary | ICD-10-CM | POA: Diagnosis not present

## 2019-09-23 DIAGNOSIS — Z8673 Personal history of transient ischemic attack (TIA), and cerebral infarction without residual deficits: Secondary | ICD-10-CM | POA: Diagnosis not present

## 2019-09-23 LAB — CULTURE, BLOOD (SINGLE)
Culture: NO GROWTH
Special Requests: ADEQUATE

## 2019-09-23 NOTE — Telephone Encounter (Signed)
Tina aware and verbalized understanding. °

## 2019-09-24 DIAGNOSIS — R7881 Bacteremia: Secondary | ICD-10-CM | POA: Diagnosis not present

## 2019-09-24 DIAGNOSIS — B9689 Other specified bacterial agents as the cause of diseases classified elsewhere: Secondary | ICD-10-CM | POA: Diagnosis not present

## 2019-09-24 DIAGNOSIS — N390 Urinary tract infection, site not specified: Secondary | ICD-10-CM | POA: Diagnosis not present

## 2019-09-24 DIAGNOSIS — B952 Enterococcus as the cause of diseases classified elsewhere: Secondary | ICD-10-CM | POA: Diagnosis not present

## 2019-09-24 DIAGNOSIS — G9341 Metabolic encephalopathy: Secondary | ICD-10-CM | POA: Diagnosis not present

## 2019-09-24 DIAGNOSIS — F0390 Unspecified dementia without behavioral disturbance: Secondary | ICD-10-CM | POA: Diagnosis not present

## 2019-09-27 DIAGNOSIS — B9689 Other specified bacterial agents as the cause of diseases classified elsewhere: Secondary | ICD-10-CM | POA: Diagnosis not present

## 2019-09-27 DIAGNOSIS — N390 Urinary tract infection, site not specified: Secondary | ICD-10-CM | POA: Diagnosis not present

## 2019-09-27 DIAGNOSIS — G9341 Metabolic encephalopathy: Secondary | ICD-10-CM | POA: Diagnosis not present

## 2019-09-27 DIAGNOSIS — F0390 Unspecified dementia without behavioral disturbance: Secondary | ICD-10-CM | POA: Diagnosis not present

## 2019-09-27 DIAGNOSIS — R7881 Bacteremia: Secondary | ICD-10-CM | POA: Diagnosis not present

## 2019-09-27 DIAGNOSIS — B952 Enterococcus as the cause of diseases classified elsewhere: Secondary | ICD-10-CM | POA: Diagnosis not present

## 2019-09-28 DIAGNOSIS — F0390 Unspecified dementia without behavioral disturbance: Secondary | ICD-10-CM | POA: Diagnosis not present

## 2019-09-28 DIAGNOSIS — G9341 Metabolic encephalopathy: Secondary | ICD-10-CM | POA: Diagnosis not present

## 2019-09-28 DIAGNOSIS — N390 Urinary tract infection, site not specified: Secondary | ICD-10-CM | POA: Diagnosis not present

## 2019-09-28 DIAGNOSIS — B952 Enterococcus as the cause of diseases classified elsewhere: Secondary | ICD-10-CM | POA: Diagnosis not present

## 2019-09-28 DIAGNOSIS — B9689 Other specified bacterial agents as the cause of diseases classified elsewhere: Secondary | ICD-10-CM | POA: Diagnosis not present

## 2019-09-28 DIAGNOSIS — R7881 Bacteremia: Secondary | ICD-10-CM | POA: Diagnosis not present

## 2019-09-29 ENCOUNTER — Ambulatory Visit: Admitting: Family Medicine

## 2019-10-01 DIAGNOSIS — F0390 Unspecified dementia without behavioral disturbance: Secondary | ICD-10-CM | POA: Diagnosis not present

## 2019-10-01 DIAGNOSIS — G9341 Metabolic encephalopathy: Secondary | ICD-10-CM | POA: Diagnosis not present

## 2019-10-01 DIAGNOSIS — N390 Urinary tract infection, site not specified: Secondary | ICD-10-CM | POA: Diagnosis not present

## 2019-10-01 DIAGNOSIS — B9689 Other specified bacterial agents as the cause of diseases classified elsewhere: Secondary | ICD-10-CM | POA: Diagnosis not present

## 2019-10-01 DIAGNOSIS — R7881 Bacteremia: Secondary | ICD-10-CM | POA: Diagnosis not present

## 2019-10-01 DIAGNOSIS — B952 Enterococcus as the cause of diseases classified elsewhere: Secondary | ICD-10-CM | POA: Diagnosis not present

## 2019-10-04 ENCOUNTER — Other Ambulatory Visit: Payer: BLUE CROSS/BLUE SHIELD

## 2019-10-04 DIAGNOSIS — N390 Urinary tract infection, site not specified: Secondary | ICD-10-CM | POA: Diagnosis not present

## 2019-10-04 DIAGNOSIS — B9689 Other specified bacterial agents as the cause of diseases classified elsewhere: Secondary | ICD-10-CM | POA: Diagnosis not present

## 2019-10-04 DIAGNOSIS — G9341 Metabolic encephalopathy: Secondary | ICD-10-CM | POA: Diagnosis not present

## 2019-10-04 DIAGNOSIS — F0390 Unspecified dementia without behavioral disturbance: Secondary | ICD-10-CM | POA: Diagnosis not present

## 2019-10-04 DIAGNOSIS — B952 Enterococcus as the cause of diseases classified elsewhere: Secondary | ICD-10-CM | POA: Diagnosis not present

## 2019-10-04 DIAGNOSIS — R7881 Bacteremia: Secondary | ICD-10-CM | POA: Diagnosis not present

## 2019-10-05 ENCOUNTER — Other Ambulatory Visit: Payer: Self-pay | Admitting: *Deleted

## 2019-10-05 MED ORDER — LOPERAMIDE HCL 2 MG PO TABS: 2.0000 mg | ORAL_TABLET | Freq: Four times a day (QID) | ORAL | 0 refills | Status: AC | PRN

## 2019-10-07 ENCOUNTER — Telehealth: Payer: Self-pay

## 2019-10-07 ENCOUNTER — Other Ambulatory Visit: Payer: Self-pay | Admitting: Family Medicine

## 2019-10-07 ENCOUNTER — Ambulatory Visit (INDEPENDENT_AMBULATORY_CARE_PROVIDER_SITE_OTHER): Payer: Medicare Other

## 2019-10-07 ENCOUNTER — Other Ambulatory Visit: Payer: Self-pay

## 2019-10-07 DIAGNOSIS — E669 Obesity, unspecified: Secondary | ICD-10-CM

## 2019-10-07 DIAGNOSIS — I69354 Hemiplegia and hemiparesis following cerebral infarction affecting left non-dominant side: Secondary | ICD-10-CM

## 2019-10-07 DIAGNOSIS — B952 Enterococcus as the cause of diseases classified elsewhere: Secondary | ICD-10-CM

## 2019-10-07 DIAGNOSIS — K469 Unspecified abdominal hernia without obstruction or gangrene: Secondary | ICD-10-CM

## 2019-10-07 DIAGNOSIS — I10 Essential (primary) hypertension: Secondary | ICD-10-CM

## 2019-10-07 DIAGNOSIS — R7881 Bacteremia: Secondary | ICD-10-CM

## 2019-10-07 DIAGNOSIS — G9341 Metabolic encephalopathy: Secondary | ICD-10-CM

## 2019-10-07 DIAGNOSIS — F0390 Unspecified dementia without behavioral disturbance: Secondary | ICD-10-CM | POA: Diagnosis not present

## 2019-10-07 DIAGNOSIS — R197 Diarrhea, unspecified: Secondary | ICD-10-CM

## 2019-10-07 DIAGNOSIS — N390 Urinary tract infection, site not specified: Secondary | ICD-10-CM | POA: Diagnosis not present

## 2019-10-07 DIAGNOSIS — J9611 Chronic respiratory failure with hypoxia: Secondary | ICD-10-CM

## 2019-10-07 DIAGNOSIS — F419 Anxiety disorder, unspecified: Secondary | ICD-10-CM

## 2019-10-07 DIAGNOSIS — B9689 Other specified bacterial agents as the cause of diseases classified elsewhere: Secondary | ICD-10-CM

## 2019-10-07 DIAGNOSIS — M1990 Unspecified osteoarthritis, unspecified site: Secondary | ICD-10-CM

## 2019-10-07 DIAGNOSIS — L89151 Pressure ulcer of sacral region, stage 1: Secondary | ICD-10-CM

## 2019-10-07 DIAGNOSIS — I959 Hypotension, unspecified: Secondary | ICD-10-CM

## 2019-10-07 DIAGNOSIS — F329 Major depressive disorder, single episode, unspecified: Secondary | ICD-10-CM

## 2019-10-07 DIAGNOSIS — K219 Gastro-esophageal reflux disease without esophagitis: Secondary | ICD-10-CM

## 2019-10-07 MED ORDER — PAROXETINE HCL 20 MG PO TABS: ORAL_TABLET | ORAL | 2 refills | Status: AC

## 2019-10-07 MED ORDER — NITROFURANTOIN MONOHYD MACRO 100 MG PO CAPS
100.0000 mg | ORAL_CAPSULE | Freq: Two times a day (BID) | ORAL | 0 refills | Status: DC
Start: 2019-10-07 — End: 2019-11-03

## 2019-10-07 NOTE — Telephone Encounter (Signed)
Received urine culture result. Macrobid sent to pharmacy per Dr Darlyn Read. [name] with [facility] notified and verbalized understanding

## 2019-10-08 DIAGNOSIS — B952 Enterococcus as the cause of diseases classified elsewhere: Secondary | ICD-10-CM | POA: Diagnosis not present

## 2019-10-08 DIAGNOSIS — G9341 Metabolic encephalopathy: Secondary | ICD-10-CM | POA: Diagnosis not present

## 2019-10-08 DIAGNOSIS — N390 Urinary tract infection, site not specified: Secondary | ICD-10-CM | POA: Diagnosis not present

## 2019-10-08 DIAGNOSIS — R7881 Bacteremia: Secondary | ICD-10-CM | POA: Diagnosis not present

## 2019-10-08 DIAGNOSIS — B9689 Other specified bacterial agents as the cause of diseases classified elsewhere: Secondary | ICD-10-CM | POA: Diagnosis not present

## 2019-10-08 DIAGNOSIS — F0390 Unspecified dementia without behavioral disturbance: Secondary | ICD-10-CM | POA: Diagnosis not present

## 2019-10-12 DIAGNOSIS — B9689 Other specified bacterial agents as the cause of diseases classified elsewhere: Secondary | ICD-10-CM | POA: Diagnosis not present

## 2019-10-12 DIAGNOSIS — B952 Enterococcus as the cause of diseases classified elsewhere: Secondary | ICD-10-CM | POA: Diagnosis not present

## 2019-10-12 DIAGNOSIS — N390 Urinary tract infection, site not specified: Secondary | ICD-10-CM | POA: Diagnosis not present

## 2019-10-12 DIAGNOSIS — R7881 Bacteremia: Secondary | ICD-10-CM | POA: Diagnosis not present

## 2019-10-12 DIAGNOSIS — F0390 Unspecified dementia without behavioral disturbance: Secondary | ICD-10-CM | POA: Diagnosis not present

## 2019-10-12 DIAGNOSIS — G9341 Metabolic encephalopathy: Secondary | ICD-10-CM | POA: Diagnosis not present

## 2019-10-13 DIAGNOSIS — F0390 Unspecified dementia without behavioral disturbance: Secondary | ICD-10-CM | POA: Diagnosis not present

## 2019-10-13 DIAGNOSIS — N390 Urinary tract infection, site not specified: Secondary | ICD-10-CM | POA: Diagnosis not present

## 2019-10-13 DIAGNOSIS — R7881 Bacteremia: Secondary | ICD-10-CM | POA: Diagnosis not present

## 2019-10-13 DIAGNOSIS — B9689 Other specified bacterial agents as the cause of diseases classified elsewhere: Secondary | ICD-10-CM | POA: Diagnosis not present

## 2019-10-13 DIAGNOSIS — G9341 Metabolic encephalopathy: Secondary | ICD-10-CM | POA: Diagnosis not present

## 2019-10-13 DIAGNOSIS — B952 Enterococcus as the cause of diseases classified elsewhere: Secondary | ICD-10-CM | POA: Diagnosis not present

## 2019-10-14 DIAGNOSIS — R7881 Bacteremia: Secondary | ICD-10-CM | POA: Diagnosis not present

## 2019-10-14 DIAGNOSIS — B9689 Other specified bacterial agents as the cause of diseases classified elsewhere: Secondary | ICD-10-CM | POA: Diagnosis not present

## 2019-10-14 DIAGNOSIS — G9341 Metabolic encephalopathy: Secondary | ICD-10-CM | POA: Diagnosis not present

## 2019-10-14 DIAGNOSIS — N390 Urinary tract infection, site not specified: Secondary | ICD-10-CM | POA: Diagnosis not present

## 2019-10-14 DIAGNOSIS — B952 Enterococcus as the cause of diseases classified elsewhere: Secondary | ICD-10-CM | POA: Diagnosis not present

## 2019-10-14 DIAGNOSIS — F0390 Unspecified dementia without behavioral disturbance: Secondary | ICD-10-CM | POA: Diagnosis not present

## 2019-10-16 DIAGNOSIS — I110 Hypertensive heart disease with heart failure: Secondary | ICD-10-CM | POA: Diagnosis not present

## 2019-10-16 DIAGNOSIS — I959 Hypotension, unspecified: Secondary | ICD-10-CM | POA: Diagnosis not present

## 2019-10-16 DIAGNOSIS — J9811 Atelectasis: Secondary | ICD-10-CM | POA: Diagnosis not present

## 2019-10-16 DIAGNOSIS — I739 Peripheral vascular disease, unspecified: Secondary | ICD-10-CM | POA: Diagnosis not present

## 2019-10-16 DIAGNOSIS — N390 Urinary tract infection, site not specified: Secondary | ICD-10-CM | POA: Diagnosis not present

## 2019-10-16 DIAGNOSIS — M25552 Pain in left hip: Secondary | ICD-10-CM | POA: Diagnosis not present

## 2019-10-16 DIAGNOSIS — F419 Anxiety disorder, unspecified: Secondary | ICD-10-CM | POA: Diagnosis not present

## 2019-10-16 DIAGNOSIS — I509 Heart failure, unspecified: Secondary | ICD-10-CM | POA: Diagnosis not present

## 2019-10-16 DIAGNOSIS — R410 Disorientation, unspecified: Secondary | ICD-10-CM | POA: Diagnosis not present

## 2019-10-16 DIAGNOSIS — Z20822 Contact with and (suspected) exposure to covid-19: Secondary | ICD-10-CM | POA: Diagnosis not present

## 2019-10-16 DIAGNOSIS — R52 Pain, unspecified: Secondary | ICD-10-CM | POA: Diagnosis not present

## 2019-10-17 DIAGNOSIS — Z7401 Bed confinement status: Secondary | ICD-10-CM | POA: Diagnosis not present

## 2019-10-17 DIAGNOSIS — I959 Hypotension, unspecified: Secondary | ICD-10-CM | POA: Diagnosis not present

## 2019-10-17 DIAGNOSIS — R404 Transient alteration of awareness: Secondary | ICD-10-CM | POA: Diagnosis not present

## 2019-10-17 DIAGNOSIS — R531 Weakness: Secondary | ICD-10-CM | POA: Diagnosis not present

## 2019-10-18 DIAGNOSIS — B952 Enterococcus as the cause of diseases classified elsewhere: Secondary | ICD-10-CM | POA: Diagnosis not present

## 2019-10-18 DIAGNOSIS — E860 Dehydration: Secondary | ICD-10-CM | POA: Diagnosis not present

## 2019-10-18 DIAGNOSIS — R1084 Generalized abdominal pain: Secondary | ICD-10-CM | POA: Diagnosis not present

## 2019-10-18 DIAGNOSIS — R52 Pain, unspecified: Secondary | ICD-10-CM | POA: Diagnosis not present

## 2019-10-18 DIAGNOSIS — F0390 Unspecified dementia without behavioral disturbance: Secondary | ICD-10-CM | POA: Diagnosis not present

## 2019-10-18 DIAGNOSIS — G9341 Metabolic encephalopathy: Secondary | ICD-10-CM | POA: Diagnosis not present

## 2019-10-18 DIAGNOSIS — N390 Urinary tract infection, site not specified: Secondary | ICD-10-CM | POA: Diagnosis not present

## 2019-10-18 DIAGNOSIS — R7881 Bacteremia: Secondary | ICD-10-CM | POA: Diagnosis not present

## 2019-10-18 DIAGNOSIS — B9689 Other specified bacterial agents as the cause of diseases classified elsewhere: Secondary | ICD-10-CM | POA: Diagnosis not present

## 2019-10-18 DIAGNOSIS — R29810 Facial weakness: Secondary | ICD-10-CM | POA: Diagnosis not present

## 2019-10-18 DIAGNOSIS — R0902 Hypoxemia: Secondary | ICD-10-CM | POA: Diagnosis not present

## 2019-10-19 ENCOUNTER — Other Ambulatory Visit: Payer: Self-pay | Admitting: Family Medicine

## 2019-10-19 ENCOUNTER — Telehealth: Payer: Self-pay | Admitting: *Deleted

## 2019-10-19 DIAGNOSIS — Z7401 Bed confinement status: Secondary | ICD-10-CM | POA: Diagnosis not present

## 2019-10-19 DIAGNOSIS — R410 Disorientation, unspecified: Secondary | ICD-10-CM | POA: Diagnosis not present

## 2019-10-19 DIAGNOSIS — R404 Transient alteration of awareness: Secondary | ICD-10-CM | POA: Diagnosis not present

## 2019-10-19 DIAGNOSIS — R531 Weakness: Secondary | ICD-10-CM | POA: Diagnosis not present

## 2019-10-19 NOTE — Telephone Encounter (Signed)
Megan Fisher aware of provider's advice.

## 2019-10-19 NOTE — Telephone Encounter (Signed)
Okay to reduce the Lasix.  She should try Tylenol PM at bedtime for sleep.

## 2019-10-19 NOTE — Telephone Encounter (Signed)
[name] from [facility] called stating the pt is still having trouble sleeping and has tried melatonin and Ativan and it isn't helping.  Also she went back to ED and her BP were running low and she is on daily lasix but her legs aren't swelling as much is it ok to do lasix every other day?

## 2019-10-20 DIAGNOSIS — B9689 Other specified bacterial agents as the cause of diseases classified elsewhere: Secondary | ICD-10-CM | POA: Diagnosis not present

## 2019-10-20 DIAGNOSIS — F0390 Unspecified dementia without behavioral disturbance: Secondary | ICD-10-CM | POA: Diagnosis not present

## 2019-10-20 DIAGNOSIS — N390 Urinary tract infection, site not specified: Secondary | ICD-10-CM | POA: Diagnosis not present

## 2019-10-20 DIAGNOSIS — R7881 Bacteremia: Secondary | ICD-10-CM | POA: Diagnosis not present

## 2019-10-20 DIAGNOSIS — B952 Enterococcus as the cause of diseases classified elsewhere: Secondary | ICD-10-CM | POA: Diagnosis not present

## 2019-10-20 DIAGNOSIS — G9341 Metabolic encephalopathy: Secondary | ICD-10-CM | POA: Diagnosis not present

## 2019-10-21 DIAGNOSIS — B9689 Other specified bacterial agents as the cause of diseases classified elsewhere: Secondary | ICD-10-CM | POA: Diagnosis not present

## 2019-10-21 DIAGNOSIS — R7881 Bacteremia: Secondary | ICD-10-CM | POA: Diagnosis not present

## 2019-10-21 DIAGNOSIS — N390 Urinary tract infection, site not specified: Secondary | ICD-10-CM | POA: Diagnosis not present

## 2019-10-21 DIAGNOSIS — F0390 Unspecified dementia without behavioral disturbance: Secondary | ICD-10-CM | POA: Diagnosis not present

## 2019-10-21 DIAGNOSIS — B952 Enterococcus as the cause of diseases classified elsewhere: Secondary | ICD-10-CM | POA: Diagnosis not present

## 2019-10-21 DIAGNOSIS — G9341 Metabolic encephalopathy: Secondary | ICD-10-CM | POA: Diagnosis not present

## 2019-10-22 DIAGNOSIS — K219 Gastro-esophageal reflux disease without esophagitis: Secondary | ICD-10-CM | POA: Diagnosis not present

## 2019-10-22 DIAGNOSIS — M1990 Unspecified osteoarthritis, unspecified site: Secondary | ICD-10-CM | POA: Diagnosis not present

## 2019-10-22 DIAGNOSIS — B9689 Other specified bacterial agents as the cause of diseases classified elsewhere: Secondary | ICD-10-CM | POA: Diagnosis not present

## 2019-10-22 DIAGNOSIS — F419 Anxiety disorder, unspecified: Secondary | ICD-10-CM | POA: Diagnosis not present

## 2019-10-22 DIAGNOSIS — K469 Unspecified abdominal hernia without obstruction or gangrene: Secondary | ICD-10-CM | POA: Diagnosis not present

## 2019-10-22 DIAGNOSIS — I10 Essential (primary) hypertension: Secondary | ICD-10-CM | POA: Diagnosis not present

## 2019-10-22 DIAGNOSIS — Z7401 Bed confinement status: Secondary | ICD-10-CM | POA: Diagnosis not present

## 2019-10-22 DIAGNOSIS — Z87440 Personal history of urinary (tract) infections: Secondary | ICD-10-CM | POA: Diagnosis not present

## 2019-10-22 DIAGNOSIS — E669 Obesity, unspecified: Secondary | ICD-10-CM | POA: Diagnosis not present

## 2019-10-22 DIAGNOSIS — R197 Diarrhea, unspecified: Secondary | ICD-10-CM | POA: Diagnosis not present

## 2019-10-22 DIAGNOSIS — L89151 Pressure ulcer of sacral region, stage 1: Secondary | ICD-10-CM | POA: Diagnosis not present

## 2019-10-22 DIAGNOSIS — Z792 Long term (current) use of antibiotics: Secondary | ICD-10-CM | POA: Diagnosis not present

## 2019-10-22 DIAGNOSIS — F0390 Unspecified dementia without behavioral disturbance: Secondary | ICD-10-CM | POA: Diagnosis not present

## 2019-10-22 DIAGNOSIS — I959 Hypotension, unspecified: Secondary | ICD-10-CM | POA: Diagnosis not present

## 2019-10-22 DIAGNOSIS — N390 Urinary tract infection, site not specified: Secondary | ICD-10-CM | POA: Diagnosis not present

## 2019-10-22 DIAGNOSIS — F329 Major depressive disorder, single episode, unspecified: Secondary | ICD-10-CM | POA: Diagnosis not present

## 2019-10-22 DIAGNOSIS — R7881 Bacteremia: Secondary | ICD-10-CM | POA: Diagnosis not present

## 2019-10-22 DIAGNOSIS — Z7901 Long term (current) use of anticoagulants: Secondary | ICD-10-CM | POA: Diagnosis not present

## 2019-10-22 DIAGNOSIS — J9611 Chronic respiratory failure with hypoxia: Secondary | ICD-10-CM | POA: Diagnosis not present

## 2019-10-22 DIAGNOSIS — I69354 Hemiplegia and hemiparesis following cerebral infarction affecting left non-dominant side: Secondary | ICD-10-CM | POA: Diagnosis not present

## 2019-10-22 DIAGNOSIS — B952 Enterococcus as the cause of diseases classified elsewhere: Secondary | ICD-10-CM | POA: Diagnosis not present

## 2019-10-22 DIAGNOSIS — G9341 Metabolic encephalopathy: Secondary | ICD-10-CM | POA: Diagnosis not present

## 2019-10-22 DIAGNOSIS — Z9981 Dependence on supplemental oxygen: Secondary | ICD-10-CM | POA: Diagnosis not present

## 2019-10-26 ENCOUNTER — Other Ambulatory Visit: Payer: Self-pay | Admitting: Family Medicine

## 2019-10-26 ENCOUNTER — Telehealth: Payer: Self-pay | Admitting: *Deleted

## 2019-10-26 DIAGNOSIS — G9341 Metabolic encephalopathy: Secondary | ICD-10-CM | POA: Diagnosis not present

## 2019-10-26 DIAGNOSIS — I6930 Unspecified sequelae of cerebral infarction: Secondary | ICD-10-CM

## 2019-10-26 DIAGNOSIS — F0390 Unspecified dementia without behavioral disturbance: Secondary | ICD-10-CM | POA: Diagnosis not present

## 2019-10-26 DIAGNOSIS — B9689 Other specified bacterial agents as the cause of diseases classified elsewhere: Secondary | ICD-10-CM | POA: Diagnosis not present

## 2019-10-26 DIAGNOSIS — R7881 Bacteremia: Secondary | ICD-10-CM | POA: Diagnosis not present

## 2019-10-26 DIAGNOSIS — B952 Enterococcus as the cause of diseases classified elsewhere: Secondary | ICD-10-CM | POA: Diagnosis not present

## 2019-10-26 DIAGNOSIS — N390 Urinary tract infection, site not specified: Secondary | ICD-10-CM | POA: Diagnosis not present

## 2019-10-26 NOTE — Telephone Encounter (Signed)
Darlene w/ [facility] Pt's O2 was checked and was 74 on room air. Oxygen was in the house and was put on patient, which patient stated she was only to use as needed. O2 sat at recheck on O2 was 99.  Discharge instructions from 09/17/19 from [facility] O2 is  Lifetime 2L continuous. Agustin Cree is informing patient of this [facility] will need Rx sent to home office for their med list

## 2019-10-26 NOTE — Telephone Encounter (Signed)
Printed. In my Outbox.

## 2019-10-27 ENCOUNTER — Telehealth: Payer: Self-pay | Admitting: Family Medicine

## 2019-10-27 DIAGNOSIS — R7881 Bacteremia: Secondary | ICD-10-CM | POA: Diagnosis not present

## 2019-10-27 DIAGNOSIS — N390 Urinary tract infection, site not specified: Secondary | ICD-10-CM | POA: Diagnosis not present

## 2019-10-27 DIAGNOSIS — G9341 Metabolic encephalopathy: Secondary | ICD-10-CM | POA: Diagnosis not present

## 2019-10-27 DIAGNOSIS — B9689 Other specified bacterial agents as the cause of diseases classified elsewhere: Secondary | ICD-10-CM | POA: Diagnosis not present

## 2019-10-27 DIAGNOSIS — F0390 Unspecified dementia without behavioral disturbance: Secondary | ICD-10-CM | POA: Diagnosis not present

## 2019-10-27 DIAGNOSIS — B952 Enterococcus as the cause of diseases classified elsewhere: Secondary | ICD-10-CM | POA: Diagnosis not present

## 2019-10-27 NOTE — Telephone Encounter (Signed)
Left message for daughter to please call our office to schedule a video visit with Dr. Darlyn Read for starting the  [facility] forms.  ( (there may still be a WCC slot on 11-02-19 if needed)

## 2019-10-27 NOTE — Telephone Encounter (Signed)
Pts daughter called and wants Dr Darlyn Read to fill out FL2 form for patient so that she can get her in a nursing home. Says Megan Fisher has been getting bed sores because the home health agents are not taking well care of her and Megan Fisher is also having an issue with keeping UTI's due to not being taken well care of either. Daughter says she just wants to get Megan Fisher better care.

## 2019-10-27 NOTE — Telephone Encounter (Signed)
Can home health begin FL2?

## 2019-10-27 NOTE — Telephone Encounter (Signed)
Order faxed to [facility]

## 2019-10-27 NOTE — Telephone Encounter (Signed)
Provider must to the [facility], pt can do a video visit or in office visit for this to be completed

## 2019-10-28 ENCOUNTER — Telehealth: Payer: Self-pay | Admitting: Family Medicine

## 2019-10-28 DIAGNOSIS — B9689 Other specified bacterial agents as the cause of diseases classified elsewhere: Secondary | ICD-10-CM | POA: Diagnosis not present

## 2019-10-28 DIAGNOSIS — N390 Urinary tract infection, site not specified: Secondary | ICD-10-CM | POA: Diagnosis not present

## 2019-10-28 DIAGNOSIS — F0390 Unspecified dementia without behavioral disturbance: Secondary | ICD-10-CM | POA: Diagnosis not present

## 2019-10-28 DIAGNOSIS — R7881 Bacteremia: Secondary | ICD-10-CM | POA: Diagnosis not present

## 2019-10-28 DIAGNOSIS — G9341 Metabolic encephalopathy: Secondary | ICD-10-CM | POA: Diagnosis not present

## 2019-10-28 DIAGNOSIS — B952 Enterococcus as the cause of diseases classified elsewhere: Secondary | ICD-10-CM | POA: Diagnosis not present

## 2019-10-28 NOTE — Telephone Encounter (Signed)
Appt made for Tues 11/02/19

## 2019-10-29 DIAGNOSIS — R52 Pain, unspecified: Secondary | ICD-10-CM | POA: Diagnosis not present

## 2019-10-29 DIAGNOSIS — M85872 Other specified disorders of bone density and structure, left ankle and foot: Secondary | ICD-10-CM | POA: Diagnosis not present

## 2019-10-29 DIAGNOSIS — Z7401 Bed confinement status: Secondary | ICD-10-CM | POA: Diagnosis not present

## 2019-10-29 DIAGNOSIS — M19071 Primary osteoarthritis, right ankle and foot: Secondary | ICD-10-CM | POA: Diagnosis not present

## 2019-10-29 DIAGNOSIS — R531 Weakness: Secondary | ICD-10-CM | POA: Diagnosis not present

## 2019-10-29 DIAGNOSIS — R5381 Other malaise: Secondary | ICD-10-CM | POA: Diagnosis not present

## 2019-10-29 DIAGNOSIS — G8190 Hemiplegia, unspecified affecting unspecified side: Secondary | ICD-10-CM | POA: Diagnosis not present

## 2019-10-30 DIAGNOSIS — B9689 Other specified bacterial agents as the cause of diseases classified elsewhere: Secondary | ICD-10-CM | POA: Diagnosis not present

## 2019-10-30 DIAGNOSIS — N390 Urinary tract infection, site not specified: Secondary | ICD-10-CM | POA: Diagnosis not present

## 2019-10-30 DIAGNOSIS — G9341 Metabolic encephalopathy: Secondary | ICD-10-CM | POA: Diagnosis not present

## 2019-10-30 DIAGNOSIS — F0390 Unspecified dementia without behavioral disturbance: Secondary | ICD-10-CM | POA: Diagnosis not present

## 2019-10-30 DIAGNOSIS — B952 Enterococcus as the cause of diseases classified elsewhere: Secondary | ICD-10-CM | POA: Diagnosis not present

## 2019-10-30 DIAGNOSIS — R7881 Bacteremia: Secondary | ICD-10-CM | POA: Diagnosis not present

## 2019-11-01 ENCOUNTER — Telehealth: Payer: Self-pay | Admitting: Family Medicine

## 2019-11-01 ENCOUNTER — Other Ambulatory Visit: Payer: Self-pay | Admitting: Family Medicine

## 2019-11-01 DIAGNOSIS — G9341 Metabolic encephalopathy: Secondary | ICD-10-CM | POA: Diagnosis not present

## 2019-11-01 DIAGNOSIS — F0390 Unspecified dementia without behavioral disturbance: Secondary | ICD-10-CM | POA: Diagnosis not present

## 2019-11-01 DIAGNOSIS — B9689 Other specified bacterial agents as the cause of diseases classified elsewhere: Secondary | ICD-10-CM | POA: Diagnosis not present

## 2019-11-01 DIAGNOSIS — B952 Enterococcus as the cause of diseases classified elsewhere: Secondary | ICD-10-CM | POA: Diagnosis not present

## 2019-11-01 DIAGNOSIS — R7881 Bacteremia: Secondary | ICD-10-CM | POA: Diagnosis not present

## 2019-11-01 DIAGNOSIS — N390 Urinary tract infection, site not specified: Secondary | ICD-10-CM | POA: Diagnosis not present

## 2019-11-01 NOTE — Telephone Encounter (Signed)
Went straight to VM but it was full can not leave message

## 2019-11-01 NOTE — Telephone Encounter (Signed)
Mail box full can not leave message  °

## 2019-11-02 ENCOUNTER — Telehealth: Payer: Medicare Other | Admitting: Family Medicine

## 2019-11-02 DIAGNOSIS — G9341 Metabolic encephalopathy: Secondary | ICD-10-CM | POA: Diagnosis not present

## 2019-11-02 DIAGNOSIS — F0390 Unspecified dementia without behavioral disturbance: Secondary | ICD-10-CM | POA: Diagnosis not present

## 2019-11-02 DIAGNOSIS — B9689 Other specified bacterial agents as the cause of diseases classified elsewhere: Secondary | ICD-10-CM | POA: Diagnosis not present

## 2019-11-02 DIAGNOSIS — N390 Urinary tract infection, site not specified: Secondary | ICD-10-CM | POA: Diagnosis not present

## 2019-11-02 DIAGNOSIS — B952 Enterococcus as the cause of diseases classified elsewhere: Secondary | ICD-10-CM | POA: Diagnosis not present

## 2019-11-02 DIAGNOSIS — R7881 Bacteremia: Secondary | ICD-10-CM | POA: Diagnosis not present

## 2019-11-03 ENCOUNTER — Telehealth (INDEPENDENT_AMBULATORY_CARE_PROVIDER_SITE_OTHER): Payer: Medicare Other | Admitting: Family Medicine

## 2019-11-03 DIAGNOSIS — N390 Urinary tract infection, site not specified: Secondary | ICD-10-CM | POA: Diagnosis not present

## 2019-11-03 DIAGNOSIS — Z7189 Other specified counseling: Secondary | ICD-10-CM

## 2019-11-03 DIAGNOSIS — Z66 Do not resuscitate: Secondary | ICD-10-CM | POA: Diagnosis not present

## 2019-11-03 DIAGNOSIS — G9341 Metabolic encephalopathy: Secondary | ICD-10-CM | POA: Diagnosis not present

## 2019-11-03 DIAGNOSIS — F0390 Unspecified dementia without behavioral disturbance: Secondary | ICD-10-CM

## 2019-11-03 DIAGNOSIS — I6930 Unspecified sequelae of cerebral infarction: Secondary | ICD-10-CM | POA: Diagnosis not present

## 2019-11-03 DIAGNOSIS — B952 Enterococcus as the cause of diseases classified elsewhere: Secondary | ICD-10-CM | POA: Diagnosis not present

## 2019-11-03 DIAGNOSIS — B9689 Other specified bacterial agents as the cause of diseases classified elsewhere: Secondary | ICD-10-CM | POA: Diagnosis not present

## 2019-11-03 DIAGNOSIS — R7881 Bacteremia: Secondary | ICD-10-CM | POA: Diagnosis not present

## 2019-11-04 DIAGNOSIS — B9689 Other specified bacterial agents as the cause of diseases classified elsewhere: Secondary | ICD-10-CM | POA: Diagnosis not present

## 2019-11-04 DIAGNOSIS — R7881 Bacteremia: Secondary | ICD-10-CM | POA: Diagnosis not present

## 2019-11-04 DIAGNOSIS — F0390 Unspecified dementia without behavioral disturbance: Secondary | ICD-10-CM | POA: Diagnosis not present

## 2019-11-04 DIAGNOSIS — N390 Urinary tract infection, site not specified: Secondary | ICD-10-CM | POA: Diagnosis not present

## 2019-11-04 DIAGNOSIS — B952 Enterococcus as the cause of diseases classified elsewhere: Secondary | ICD-10-CM | POA: Diagnosis not present

## 2019-11-04 DIAGNOSIS — G9341 Metabolic encephalopathy: Secondary | ICD-10-CM | POA: Diagnosis not present

## 2019-11-05 DIAGNOSIS — B952 Enterococcus as the cause of diseases classified elsewhere: Secondary | ICD-10-CM | POA: Diagnosis not present

## 2019-11-05 DIAGNOSIS — G9341 Metabolic encephalopathy: Secondary | ICD-10-CM | POA: Diagnosis not present

## 2019-11-05 DIAGNOSIS — R7881 Bacteremia: Secondary | ICD-10-CM | POA: Diagnosis not present

## 2019-11-05 DIAGNOSIS — B9689 Other specified bacterial agents as the cause of diseases classified elsewhere: Secondary | ICD-10-CM | POA: Diagnosis not present

## 2019-11-05 DIAGNOSIS — N390 Urinary tract infection, site not specified: Secondary | ICD-10-CM | POA: Diagnosis not present

## 2019-11-05 DIAGNOSIS — F0390 Unspecified dementia without behavioral disturbance: Secondary | ICD-10-CM | POA: Diagnosis not present

## 2019-11-07 ENCOUNTER — Encounter: Payer: Self-pay | Admitting: Family Medicine

## 2019-11-07 NOTE — Progress Notes (Signed)
Subjective:    Patient ID: Megan Fisher, female    DOB: June 16, 1928, 86 y.o.   MRN: [id number]   HPI: [name] is a 86 y.o. female presenting for video visit to complete her FL 2 [facility] [place].  She is going to need physical therapy and Occupational Therapy.  Her limitation for which she needs physical therapy is that she can barely stand and she needs to make transfers.  For occupational therapy she is having trouble using her hands they are drawing up a they need to be stretched and exercise.  She needs to be able to feed herself and that limits her ability significantly.   Depression screen [facility] 2/9 09/21/2018 10/31/2014 09/27/2013  Decreased Interest 0 3 0  Down, Depressed, Hopeless 0 3 0  PHQ - 2 Score 0 6 0  Altered sleeping - 3 -  Tired, decreased energy - 3 -  Change in appetite - 2 -  Feeling bad or failure about yourself  - 3 -  Trouble concentrating - 2 -  Moving slowly or fidgety/restless - 2 -  Suicidal thoughts - 0 -  PHQ-9 Score - 21 -     Relevant past medical, surgical, family and social history reviewed and updated as indicated.  Interim medical history since our last visit reviewed. Allergies and medications reviewed and updated.  ROS:  Review of Systems  Constitutional: Negative.   HENT: Negative for congestion.   Eyes: Negative for visual disturbance.  Respiratory: Negative for shortness of breath.   Cardiovascular: Negative for chest pain.  Gastrointestinal: Negative for abdominal pain, constipation, diarrhea, nausea and vomiting.  Genitourinary: Positive for enuresis (Incontinent of bladder and stool). Negative for difficulty urinating.  Musculoskeletal: Positive for arthralgias and myalgias.  Psychiatric/Behavioral: Negative for sleep disturbance.     Social History   Tobacco Use  Smoking Status Never Smoker  Smokeless Tobacco Never Used       Objective:     Wt Readings from Last 3 Encounters:  09/13/19  217 lb 13 oz (98.8 kg)  09/02/19 222 lb 10.6 oz (101 kg)  04/03/19 242 lb 15.2 oz (110.2 kg)     Exam deferred. Pt. Harboring due to COVID 19. Phone visit performed.   Assessment & Plan:   1. Late effect of cerebrovascular accident (CVA)   2. Goals of care, counseling/discussion   3. DNR (do not resuscitate)   4. Dementia without behavioral disturbance, unspecified dementia type (HCC)     No orders of the defined types were placed in this encounter.   No orders of the defined types were placed in this encounter.     Diagnoses and all orders for this visit:  Late effect of cerebrovascular accident (CVA)  Goals of care, counseling/discussion  DNR (do not resuscitate)  Dementia without behavioral disturbance, unspecified dementia type (HCC)    Virtual Visit via video note  I discussed the limitations, risks, security and privacy concerns of performing an evaluation and management service by telephone and the availability of in person appointments. The patient was identified with two identifiers.  The patient was represented by her daughter Megan Fisher, she expressed understanding and agreed to proceed. Pt. Is at home. Dr. Darlyn Read is in his office.  Follow Up Instructions:   I discussed the assessment and treatment plan with the patient. The patient was provided an opportunity to ask questions and all were answered. The patient agreed with the plan and demonstrated an  understanding of the instructions.   The patient was advised to call back or seek an in-person evaluation if the symptoms worsen or if the condition fails to improve as anticipated.   Total minutes including chart review and phone contact time: 37 including time reviewing chart discussing with her daughter Megan Fisher and completing the FL 2 form.   Follow up plan: Return if symptoms worsen or fail to improve.  Megan Claude, MD [facility]

## 2019-11-08 ENCOUNTER — Telehealth: Payer: Self-pay | Admitting: Family Medicine

## 2019-11-08 DIAGNOSIS — N390 Urinary tract infection, site not specified: Secondary | ICD-10-CM | POA: Diagnosis not present

## 2019-11-08 DIAGNOSIS — B9689 Other specified bacterial agents as the cause of diseases classified elsewhere: Secondary | ICD-10-CM | POA: Diagnosis not present

## 2019-11-08 DIAGNOSIS — B952 Enterococcus as the cause of diseases classified elsewhere: Secondary | ICD-10-CM | POA: Diagnosis not present

## 2019-11-08 DIAGNOSIS — F0390 Unspecified dementia without behavioral disturbance: Secondary | ICD-10-CM | POA: Diagnosis not present

## 2019-11-08 DIAGNOSIS — R7881 Bacteremia: Secondary | ICD-10-CM | POA: Diagnosis not present

## 2019-11-08 DIAGNOSIS — G9341 Metabolic encephalopathy: Secondary | ICD-10-CM | POA: Diagnosis not present

## 2019-11-08 NOTE — Telephone Encounter (Signed)
Marylene Land called from [facility] requesting most recent OV notes within the past 30 days with Diagnosis codes, Symptoms, and O2 levels.   Can fax records to [phone]  (she said she would send another request to our fax machine)

## 2019-11-09 DIAGNOSIS — N390 Urinary tract infection, site not specified: Secondary | ICD-10-CM | POA: Diagnosis not present

## 2019-11-09 DIAGNOSIS — R7881 Bacteremia: Secondary | ICD-10-CM | POA: Diagnosis not present

## 2019-11-09 DIAGNOSIS — B952 Enterococcus as the cause of diseases classified elsewhere: Secondary | ICD-10-CM | POA: Diagnosis not present

## 2019-11-09 DIAGNOSIS — G9341 Metabolic encephalopathy: Secondary | ICD-10-CM | POA: Diagnosis not present

## 2019-11-09 DIAGNOSIS — F0390 Unspecified dementia without behavioral disturbance: Secondary | ICD-10-CM | POA: Diagnosis not present

## 2019-11-09 DIAGNOSIS — B9689 Other specified bacterial agents as the cause of diseases classified elsewhere: Secondary | ICD-10-CM | POA: Diagnosis not present

## 2019-11-10 ENCOUNTER — Telehealth: Payer: Self-pay | Admitting: Family Medicine

## 2019-11-10 DIAGNOSIS — I6930 Unspecified sequelae of cerebral infarction: Secondary | ICD-10-CM

## 2019-11-10 DIAGNOSIS — R531 Weakness: Secondary | ICD-10-CM

## 2019-11-10 DIAGNOSIS — F0390 Unspecified dementia without behavioral disturbance: Secondary | ICD-10-CM

## 2019-11-10 NOTE — Telephone Encounter (Signed)
Would like to place order to [facility] to Evaluate & Treat Will send last Ov notes & Demographics to [phone]

## 2019-11-11 DIAGNOSIS — R7881 Bacteremia: Secondary | ICD-10-CM | POA: Diagnosis not present

## 2019-11-11 DIAGNOSIS — G9341 Metabolic encephalopathy: Secondary | ICD-10-CM | POA: Diagnosis not present

## 2019-11-11 DIAGNOSIS — B952 Enterococcus as the cause of diseases classified elsewhere: Secondary | ICD-10-CM | POA: Diagnosis not present

## 2019-11-11 DIAGNOSIS — B9689 Other specified bacterial agents as the cause of diseases classified elsewhere: Secondary | ICD-10-CM | POA: Diagnosis not present

## 2019-11-11 DIAGNOSIS — F0390 Unspecified dementia without behavioral disturbance: Secondary | ICD-10-CM | POA: Diagnosis not present

## 2019-11-11 DIAGNOSIS — N390 Urinary tract infection, site not specified: Secondary | ICD-10-CM | POA: Diagnosis not present

## 2019-11-11 NOTE — Telephone Encounter (Signed)
Please do! How can I help?

## 2019-11-12 NOTE — Telephone Encounter (Signed)
Please sign pending referral

## 2019-11-12 NOTE — Telephone Encounter (Signed)
TC note from 7/6, Discharge summary from 5/28 which is original O2 order, and O2 readings from hospital sent to [facility]

## 2019-11-15 NOTE — Telephone Encounter (Signed)
Pt has been admitted to [facility]

## 2019-11-15 NOTE — Telephone Encounter (Signed)
Thanks for entering the referral.  I went ahead and signed it.

## 2019-11-16 ENCOUNTER — Telehealth: Payer: Self-pay | Admitting: Family Medicine

## 2019-11-16 DIAGNOSIS — N390 Urinary tract infection, site not specified: Secondary | ICD-10-CM | POA: Diagnosis not present

## 2019-11-16 DIAGNOSIS — B952 Enterococcus as the cause of diseases classified elsewhere: Secondary | ICD-10-CM | POA: Diagnosis not present

## 2019-11-16 DIAGNOSIS — F0390 Unspecified dementia without behavioral disturbance: Secondary | ICD-10-CM

## 2019-11-16 DIAGNOSIS — G9341 Metabolic encephalopathy: Secondary | ICD-10-CM | POA: Diagnosis not present

## 2019-11-16 DIAGNOSIS — I6930 Unspecified sequelae of cerebral infarction: Secondary | ICD-10-CM

## 2019-11-16 DIAGNOSIS — B9689 Other specified bacterial agents as the cause of diseases classified elsewhere: Secondary | ICD-10-CM | POA: Diagnosis not present

## 2019-11-16 DIAGNOSIS — R7881 Bacteremia: Secondary | ICD-10-CM | POA: Diagnosis not present

## 2019-11-16 DIAGNOSIS — Z515 Encounter for palliative care: Secondary | ICD-10-CM

## 2019-11-16 NOTE — Telephone Encounter (Signed)
Pts daughter states that the order for hospice is needing to be changed to palliative care.

## 2019-11-17 ENCOUNTER — Other Ambulatory Visit: Payer: Self-pay | Admitting: Family Medicine

## 2019-11-17 ENCOUNTER — Telehealth: Payer: Self-pay | Admitting: *Deleted

## 2019-11-17 MED ORDER — CEPHALEXIN 250 MG PO CAPS: 250.0000 mg | ORAL_CAPSULE | Freq: Every day | ORAL | 2 refills | Status: AC

## 2019-11-17 NOTE — Telephone Encounter (Signed)
Please let the patient know that I sent their prescription to their pharmacy. Thanks, WS 

## 2019-11-17 NOTE — Telephone Encounter (Signed)
Fax from [facility] request for Cephalexin 250 mg 1 QD for urinary tract prevention #14 Please advise

## 2019-11-18 NOTE — Telephone Encounter (Signed)
I signed the referral  Thanks!

## 2019-11-18 NOTE — Telephone Encounter (Signed)
Pt is wanting palliative care, not end of life Hospice care Can referral to palliative care be signed and I will send to [facility] who has palliative care  [facility] does not have palliative care

## 2019-11-18 NOTE — Telephone Encounter (Signed)
Community message sent to [place], palliative care w/ [facility]

## 2019-11-25 DIAGNOSIS — Z515 Encounter for palliative care: Secondary | ICD-10-CM | POA: Diagnosis not present

## 2019-11-25 DIAGNOSIS — F331 Major depressive disorder, recurrent, moderate: Secondary | ICD-10-CM | POA: Diagnosis not present

## 2019-11-25 DIAGNOSIS — F0151 Vascular dementia with behavioral disturbance: Secondary | ICD-10-CM | POA: Diagnosis not present

## 2019-11-26 ENCOUNTER — Telehealth: Payer: Self-pay | Admitting: Family Medicine

## 2019-11-26 NOTE — Telephone Encounter (Signed)
Megan Fisher calling and states that she saw this pt yesterday and the daughter states the pt is not sleeping at night. Currently taking 3mg  melatonin, no helping. Wanting to see if they can prescribe Restoril 7.5mg . Also, pt has tried trazodone and made pt have hallucinations. Would like to stop melatonin is restoril prescribed. Also, family wanting to know if a wider hospital bed can be ordered. Unsure of current weight but feels like pt has gained weight since being in the office. She is almost against the rails on both sides in the current hospital bed. Please advise.

## 2019-11-30 ENCOUNTER — Other Ambulatory Visit: Payer: Self-pay | Admitting: Family Medicine

## 2019-11-30 MED ORDER — LORAZEPAM 0.5 MG PO TABS: ORAL_TABLET | ORAL | 1 refills | Status: AC

## 2019-11-30 NOTE — Telephone Encounter (Signed)
Please let them know that since she has taken lorazepam in the past, I just renewed that.  It is very similar to the temazepam.  She can take 1 or 2 at bedtime as needed for sleep.

## 2019-12-01 NOTE — Telephone Encounter (Signed)
Left detailed message.   

## 2019-12-03 DIAGNOSIS — Z880 Allergy status to penicillin: Secondary | ICD-10-CM | POA: Diagnosis not present

## 2019-12-03 DIAGNOSIS — E8809 Other disorders of plasma-protein metabolism, not elsewhere classified: Secondary | ICD-10-CM | POA: Diagnosis not present

## 2019-12-03 DIAGNOSIS — M47814 Spondylosis without myelopathy or radiculopathy, thoracic region: Secondary | ICD-10-CM | POA: Diagnosis not present

## 2019-12-03 DIAGNOSIS — R52 Pain, unspecified: Secondary | ICD-10-CM | POA: Diagnosis not present

## 2019-12-03 DIAGNOSIS — Z91040 Latex allergy status: Secondary | ICD-10-CM | POA: Diagnosis not present

## 2019-12-03 DIAGNOSIS — N200 Calculus of kidney: Secondary | ICD-10-CM | POA: Diagnosis not present

## 2019-12-03 DIAGNOSIS — I509 Heart failure, unspecified: Secondary | ICD-10-CM | POA: Diagnosis not present

## 2019-12-03 DIAGNOSIS — J9811 Atelectasis: Secondary | ICD-10-CM | POA: Diagnosis not present

## 2019-12-03 DIAGNOSIS — I700 Atherosclerosis of aorta: Secondary | ICD-10-CM | POA: Diagnosis not present

## 2019-12-03 DIAGNOSIS — L89309 Pressure ulcer of unspecified buttock, unspecified stage: Secondary | ICD-10-CM | POA: Diagnosis not present

## 2019-12-03 DIAGNOSIS — Z6838 Body mass index (BMI) 38.0-38.9, adult: Secondary | ICD-10-CM | POA: Diagnosis not present

## 2019-12-03 DIAGNOSIS — I6782 Cerebral ischemia: Secondary | ICD-10-CM | POA: Diagnosis not present

## 2019-12-03 DIAGNOSIS — K920 Hematemesis: Secondary | ICD-10-CM | POA: Diagnosis not present

## 2019-12-03 DIAGNOSIS — G319 Degenerative disease of nervous system, unspecified: Secondary | ICD-10-CM | POA: Diagnosis not present

## 2019-12-03 DIAGNOSIS — R4182 Altered mental status, unspecified: Secondary | ICD-10-CM | POA: Diagnosis not present

## 2019-12-03 DIAGNOSIS — J90 Pleural effusion, not elsewhere classified: Secondary | ICD-10-CM | POA: Diagnosis not present

## 2019-12-03 DIAGNOSIS — R5381 Other malaise: Secondary | ICD-10-CM | POA: Diagnosis present

## 2019-12-03 DIAGNOSIS — E876 Hypokalemia: Secondary | ICD-10-CM | POA: Diagnosis present

## 2019-12-03 DIAGNOSIS — I110 Hypertensive heart disease with heart failure: Secondary | ICD-10-CM | POA: Diagnosis present

## 2019-12-03 DIAGNOSIS — E46 Unspecified protein-calorie malnutrition: Secondary | ICD-10-CM | POA: Diagnosis not present

## 2019-12-03 DIAGNOSIS — D696 Thrombocytopenia, unspecified: Secondary | ICD-10-CM | POA: Diagnosis not present

## 2019-12-03 DIAGNOSIS — Z20822 Contact with and (suspected) exposure to covid-19: Secondary | ICD-10-CM | POA: Diagnosis present

## 2019-12-03 DIAGNOSIS — J189 Pneumonia, unspecified organism: Secondary | ICD-10-CM | POA: Diagnosis not present

## 2019-12-03 DIAGNOSIS — R410 Disorientation, unspecified: Secondary | ICD-10-CM | POA: Diagnosis not present

## 2019-12-03 DIAGNOSIS — B965 Pseudomonas (aeruginosa) (mallei) (pseudomallei) as the cause of diseases classified elsewhere: Secondary | ICD-10-CM | POA: Diagnosis present

## 2019-12-03 DIAGNOSIS — R627 Adult failure to thrive: Secondary | ICD-10-CM | POA: Diagnosis present

## 2019-12-03 DIAGNOSIS — K7290 Hepatic failure, unspecified without coma: Secondary | ICD-10-CM | POA: Diagnosis not present

## 2019-12-03 DIAGNOSIS — I739 Peripheral vascular disease, unspecified: Secondary | ICD-10-CM | POA: Diagnosis present

## 2019-12-03 DIAGNOSIS — E860 Dehydration: Secondary | ICD-10-CM | POA: Diagnosis not present

## 2019-12-03 DIAGNOSIS — N390 Urinary tract infection, site not specified: Secondary | ICD-10-CM | POA: Diagnosis not present

## 2019-12-03 DIAGNOSIS — Z66 Do not resuscitate: Secondary | ICD-10-CM | POA: Diagnosis present

## 2019-12-03 DIAGNOSIS — F329 Major depressive disorder, single episode, unspecified: Secondary | ICD-10-CM | POA: Diagnosis present

## 2019-12-03 DIAGNOSIS — K449 Diaphragmatic hernia without obstruction or gangrene: Secondary | ICD-10-CM | POA: Diagnosis not present

## 2019-12-03 DIAGNOSIS — I7090 Unspecified atherosclerosis: Secondary | ICD-10-CM | POA: Diagnosis not present

## 2019-12-03 DIAGNOSIS — I5033 Acute on chronic diastolic (congestive) heart failure: Secondary | ICD-10-CM | POA: Diagnosis not present

## 2019-12-03 DIAGNOSIS — Z8673 Personal history of transient ischemic attack (TIA), and cerebral infarction without residual deficits: Secondary | ICD-10-CM | POA: Diagnosis not present

## 2019-12-03 DIAGNOSIS — Z882 Allergy status to sulfonamides status: Secondary | ICD-10-CM | POA: Diagnosis not present

## 2019-12-03 DIAGNOSIS — R402411 Glasgow coma scale score 13-15, in the field [EMT or ambulance]: Secondary | ICD-10-CM | POA: Diagnosis not present

## 2019-12-03 DIAGNOSIS — Z515 Encounter for palliative care: Secondary | ICD-10-CM | POA: Diagnosis not present

## 2019-12-03 DIAGNOSIS — R404 Transient alteration of awareness: Secondary | ICD-10-CM | POA: Diagnosis not present

## 2019-12-03 DIAGNOSIS — K219 Gastro-esophageal reflux disease without esophagitis: Secondary | ICD-10-CM | POA: Diagnosis present

## 2019-12-06 ENCOUNTER — Other Ambulatory Visit: Payer: Self-pay | Admitting: Family Medicine

## 2019-12-14 ENCOUNTER — Other Ambulatory Visit: Payer: Self-pay | Admitting: Family Medicine

## 2019-12-14 DIAGNOSIS — F0390 Unspecified dementia without behavioral disturbance: Secondary | ICD-10-CM

## 2019-12-14 DIAGNOSIS — I6930 Unspecified sequelae of cerebral infarction: Secondary | ICD-10-CM

## 2019-12-22 DEATH — deceased

## 2020-08-16 IMAGING — CR PORTABLE CHEST - 1 VIEW
1 series · 1 of 1 positions shown · non-contrast
Comparison: 11/11/2018

CLINICAL DATA: Altered mental status with chest congestion.

EXAM:
PORTABLE CHEST 1 VIEW

[portable]
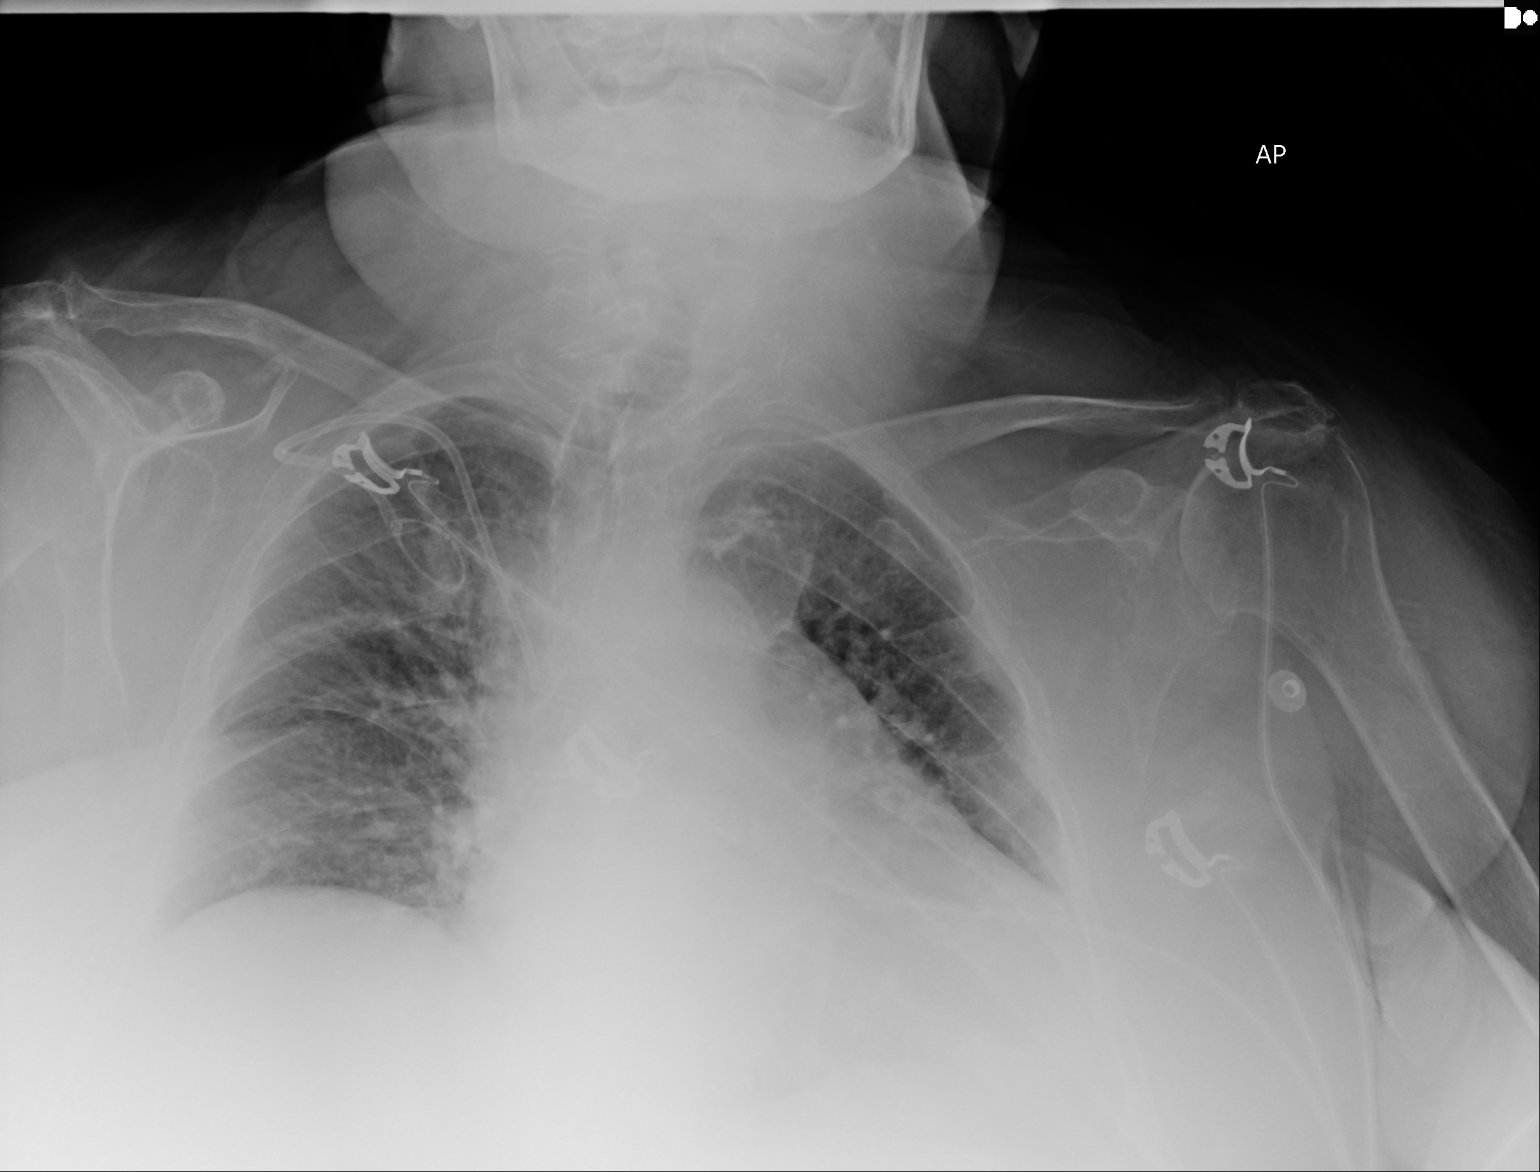

[1 of 1 positions shown; findings below may reference images not displayed]

FINDINGS: 2732 hours. Low lung volumes. The cardio pericardial silhouette is
enlarged. Vascular congestion with probable interstitial pulmonary
edema noted. No substantial pleural effusion. Bones are diffusely
demineralized. Right Port-A-Cath remains in place. Telemetry leads
overlie the chest.
IMPRESSION: Low volume film with cardiomegaly and vascular congestion. Component
of interstitial edema suspected.

## 2021-06-10 IMAGING — DX DG CHEST 1V PORT
1 series · 1 of 1 positions shown · non-contrast
Comparison: 03/30/2019

CLINICAL DATA: Urinary tract infection, weakness

EXAM:
PORTABLE CHEST 1 VIEW

[chest ap grid]
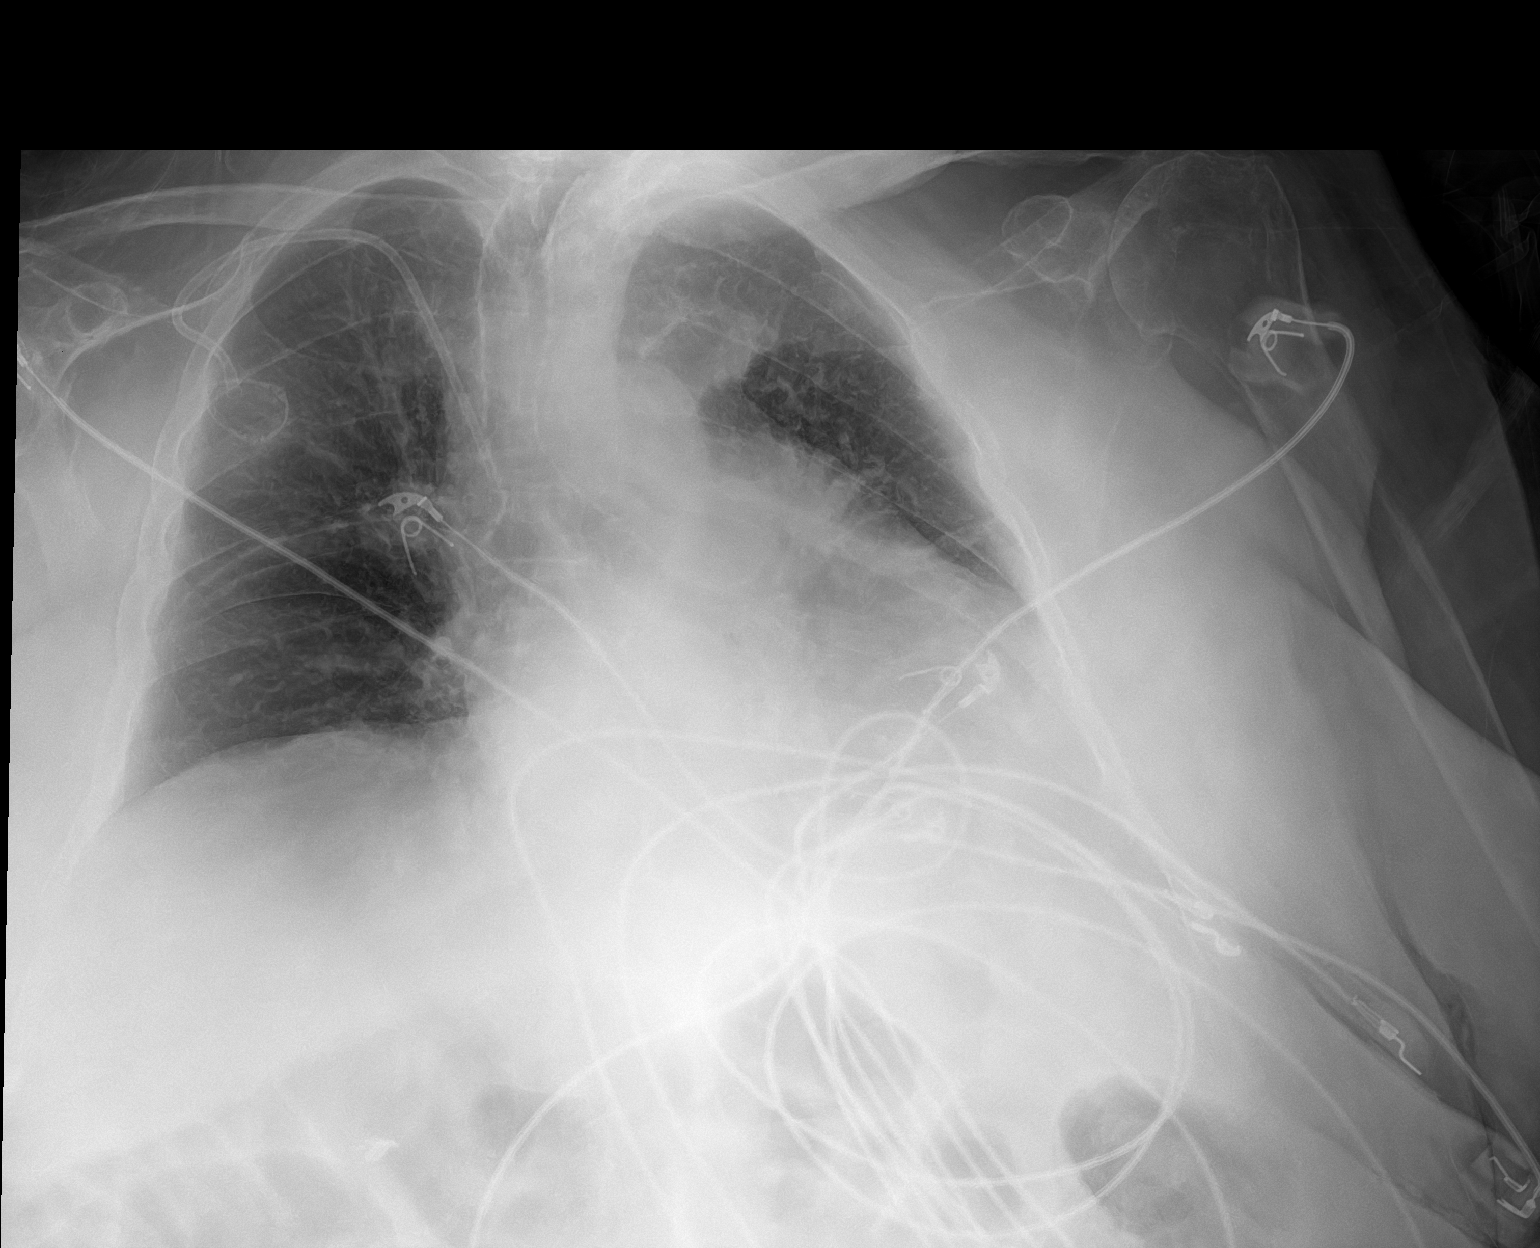

[1 of 1 positions shown; findings below may reference images not displayed]

FINDINGS: Single frontal view of the chest demonstrates a stable cardiac
silhouette. No airspace disease, effusion, or pneumothorax. Right
chest wall port unchanged.
IMPRESSION: 1. Stable exam, no acute process.
